# Patient Record
Sex: Male | Born: 1935
Health system: Southern US, Community
[De-identification: ages and names within clinical notes are randomized; demographics above are authoritative.]

## PROBLEM LIST (undated history)

## (undated) DIAGNOSIS — M545 Low back pain, unspecified: Secondary | ICD-10-CM

## (undated) DIAGNOSIS — E669 Obesity, unspecified: Secondary | ICD-10-CM

## (undated) DIAGNOSIS — R3915 Urgency of urination: Secondary | ICD-10-CM

## (undated) DIAGNOSIS — R269 Unspecified abnormalities of gait and mobility: Secondary | ICD-10-CM

## (undated) DIAGNOSIS — F32A Depression, unspecified: Secondary | ICD-10-CM

## (undated) DIAGNOSIS — I119 Hypertensive heart disease without heart failure: Secondary | ICD-10-CM

## (undated) DIAGNOSIS — Z9989 Dependence on other enabling machines and devices: Secondary | ICD-10-CM

## (undated) DIAGNOSIS — E785 Hyperlipidemia, unspecified: Secondary | ICD-10-CM

## (undated) DIAGNOSIS — G43909 Migraine, unspecified, not intractable, without status migrainosus: Secondary | ICD-10-CM

## (undated) DIAGNOSIS — F0390 Unspecified dementia without behavioral disturbance: Secondary | ICD-10-CM

## (undated) DIAGNOSIS — C44509 Unspecified malignant neoplasm of skin of other part of trunk: Secondary | ICD-10-CM

## (undated) DIAGNOSIS — I350 Nonrheumatic aortic (valve) stenosis: Secondary | ICD-10-CM

## (undated) DIAGNOSIS — E291 Testicular hypofunction: Secondary | ICD-10-CM

## (undated) DIAGNOSIS — T4145XA Adverse effect of unspecified anesthetic, initial encounter: Secondary | ICD-10-CM

## (undated) DIAGNOSIS — M1990 Unspecified osteoarthritis, unspecified site: Secondary | ICD-10-CM

## (undated) DIAGNOSIS — J449 Chronic obstructive pulmonary disease, unspecified: Secondary | ICD-10-CM

## (undated) DIAGNOSIS — G8929 Other chronic pain: Secondary | ICD-10-CM

## (undated) DIAGNOSIS — I639 Cerebral infarction, unspecified: Secondary | ICD-10-CM

## (undated) DIAGNOSIS — I5042 Chronic combined systolic (congestive) and diastolic (congestive) heart failure: Secondary | ICD-10-CM

## (undated) DIAGNOSIS — T8859XA Other complications of anesthesia, initial encounter: Secondary | ICD-10-CM

## (undated) DIAGNOSIS — I4892 Unspecified atrial flutter: Secondary | ICD-10-CM

## (undated) DIAGNOSIS — I679 Cerebrovascular disease, unspecified: Secondary | ICD-10-CM

## (undated) DIAGNOSIS — F329 Major depressive disorder, single episode, unspecified: Secondary | ICD-10-CM

## (undated) DIAGNOSIS — R296 Repeated falls: Secondary | ICD-10-CM

## (undated) DIAGNOSIS — N401 Enlarged prostate with lower urinary tract symptoms: Secondary | ICD-10-CM

## (undated) DIAGNOSIS — H04123 Dry eye syndrome of bilateral lacrimal glands: Secondary | ICD-10-CM

## (undated) DIAGNOSIS — I10 Essential (primary) hypertension: Secondary | ICD-10-CM

## (undated) DIAGNOSIS — I480 Paroxysmal atrial fibrillation: Secondary | ICD-10-CM

## (undated) DIAGNOSIS — I340 Nonrheumatic mitral (valve) insufficiency: Secondary | ICD-10-CM

## (undated) DIAGNOSIS — M4800 Spinal stenosis, site unspecified: Secondary | ICD-10-CM

## (undated) DIAGNOSIS — G4733 Obstructive sleep apnea (adult) (pediatric): Secondary | ICD-10-CM

## (undated) DIAGNOSIS — R011 Cardiac murmur, unspecified: Secondary | ICD-10-CM

## (undated) DIAGNOSIS — I2510 Atherosclerotic heart disease of native coronary artery without angina pectoris: Secondary | ICD-10-CM

## (undated) HISTORY — DX: Paroxysmal atrial fibrillation: I48.0

## (undated) HISTORY — DX: Unspecified osteoarthritis, unspecified site: M19.90

## (undated) HISTORY — DX: Chronic combined systolic (congestive) and diastolic (congestive) heart failure: I50.42

## (undated) HISTORY — DX: Hypertensive heart disease without heart failure: I11.9

## (undated) HISTORY — DX: Unspecified atrial flutter: I48.92

## (undated) HISTORY — DX: Obesity, unspecified: E66.9

## (undated) HISTORY — DX: Unspecified abnormalities of gait and mobility: R26.9

## (undated) HISTORY — DX: Hyperlipidemia, unspecified: E78.5

## (undated) HISTORY — PX: APPENDECTOMY: SHX54

## (undated) HISTORY — PX: TOTAL SHOULDER ARTHROPLASTY: SHX126

## (undated) HISTORY — PX: CATARACT EXTRACTION W/ INTRAOCULAR LENS  IMPLANT, BILATERAL: SHX1307

## (undated) HISTORY — DX: Unspecified dementia, unspecified severity, without behavioral disturbance, psychotic disturbance, mood disturbance, and anxiety: F03.90

## (undated) HISTORY — PX: MOHS SURGERY: SHX181

## (undated) HISTORY — PX: RETINAL DETACHMENT SURGERY: SHX105

## (undated) HISTORY — DX: Cerebral infarction, unspecified: I63.9

## (undated) HISTORY — DX: Cerebrovascular disease, unspecified: I67.9

## (undated) HISTORY — DX: Repeated falls: R29.6

## (undated) HISTORY — DX: Testicular hypofunction: E29.1

## (undated) HISTORY — DX: Nonrheumatic aortic (valve) stenosis: I35.0

## (undated) HISTORY — PX: JOINT REPLACEMENT: SHX530

## (undated) HISTORY — PX: EYE SURGERY: SHX253

## (undated) HISTORY — DX: Nonrheumatic mitral (valve) insufficiency: I34.0

## (undated) HISTORY — PX: CYST EXCISION: SHX5701

## (undated) HISTORY — PX: TONSILLECTOMY: SUR1361

## (undated) HISTORY — PX: SHOULDER SURGERY: SHX246

## (undated) HISTORY — DX: Atherosclerotic heart disease of native coronary artery without angina pectoris: I25.10

---

## 1989-12-15 HISTORY — PX: CORONARY ANGIOPLASTY: SHX604

## 1998-10-17 ENCOUNTER — Ambulatory Visit: Admission: RE | Admit: 1998-10-17 | Discharge: 1998-10-17 | Payer: Self-pay | Admitting: Family Medicine

## 1999-12-19 ENCOUNTER — Encounter (INDEPENDENT_AMBULATORY_CARE_PROVIDER_SITE_OTHER): Payer: Self-pay | Admitting: Specialist

## 1999-12-19 ENCOUNTER — Ambulatory Visit (HOSPITAL_COMMUNITY): Admission: RE | Admit: 1999-12-19 | Discharge: 1999-12-19 | Payer: Self-pay | Admitting: *Deleted

## 2001-12-15 HISTORY — PX: VIDEO ASSISTED THORACOSCOPY (VATS)/THOROCOTOMY: SHX6173

## 2001-12-28 ENCOUNTER — Encounter: Admission: RE | Admit: 2001-12-28 | Discharge: 2001-12-28 | Payer: Self-pay | Admitting: Ophthalmology

## 2001-12-28 ENCOUNTER — Encounter: Payer: Self-pay | Admitting: Ophthalmology

## 2002-01-21 ENCOUNTER — Encounter: Payer: Self-pay | Admitting: Cardiothoracic Surgery

## 2002-01-24 ENCOUNTER — Encounter: Payer: Self-pay | Admitting: Cardiothoracic Surgery

## 2002-01-24 ENCOUNTER — Inpatient Hospital Stay (HOSPITAL_COMMUNITY): Admission: RE | Admit: 2002-01-24 | Discharge: 2002-01-28 | Payer: Self-pay | Admitting: Cardiothoracic Surgery

## 2002-01-24 ENCOUNTER — Encounter (INDEPENDENT_AMBULATORY_CARE_PROVIDER_SITE_OTHER): Payer: Self-pay | Admitting: Specialist

## 2002-01-25 ENCOUNTER — Encounter: Payer: Self-pay | Admitting: Cardiothoracic Surgery

## 2002-01-26 ENCOUNTER — Encounter: Payer: Self-pay | Admitting: Cardiothoracic Surgery

## 2002-01-27 ENCOUNTER — Encounter: Payer: Self-pay | Admitting: Cardiothoracic Surgery

## 2002-01-28 ENCOUNTER — Encounter: Payer: Self-pay | Admitting: Cardiothoracic Surgery

## 2002-02-10 ENCOUNTER — Encounter: Admission: RE | Admit: 2002-02-10 | Discharge: 2002-02-10 | Payer: Self-pay | Admitting: Cardiothoracic Surgery

## 2002-02-10 ENCOUNTER — Encounter: Payer: Self-pay | Admitting: Cardiothoracic Surgery

## 2002-11-14 HISTORY — PX: CORONARY ARTERY BYPASS GRAFT: SHX141

## 2003-11-15 HISTORY — PX: CARDIAC CATHETERIZATION: SHX172

## 2003-11-23 ENCOUNTER — Inpatient Hospital Stay (HOSPITAL_COMMUNITY): Admission: AD | Admit: 2003-11-23 | Discharge: 2003-12-06 | Payer: Self-pay | Admitting: Cardiology

## 2003-12-22 ENCOUNTER — Encounter: Admission: RE | Admit: 2003-12-22 | Discharge: 2003-12-22 | Payer: Self-pay | Admitting: Cardiology

## 2004-01-29 ENCOUNTER — Encounter (HOSPITAL_COMMUNITY): Admission: RE | Admit: 2004-01-29 | Discharge: 2004-04-28 | Payer: Self-pay | Admitting: Cardiology

## 2004-10-15 ENCOUNTER — Encounter: Admission: RE | Admit: 2004-10-15 | Discharge: 2004-10-15 | Payer: Self-pay | Admitting: Cardiology

## 2004-12-15 DIAGNOSIS — I679 Cerebrovascular disease, unspecified: Secondary | ICD-10-CM

## 2004-12-15 HISTORY — DX: Cerebrovascular disease, unspecified: I67.9

## 2005-06-06 ENCOUNTER — Encounter: Admission: RE | Admit: 2005-06-06 | Discharge: 2005-06-06 | Payer: Self-pay | Admitting: Cardiology

## 2006-08-05 ENCOUNTER — Inpatient Hospital Stay (HOSPITAL_COMMUNITY): Admission: RE | Admit: 2006-08-05 | Discharge: 2006-08-07 | Payer: Self-pay | Admitting: Neurosurgery

## 2006-08-05 ENCOUNTER — Encounter (INDEPENDENT_AMBULATORY_CARE_PROVIDER_SITE_OTHER): Payer: Self-pay | Admitting: Specialist

## 2007-05-06 ENCOUNTER — Encounter: Admission: RE | Admit: 2007-05-06 | Discharge: 2007-05-06 | Payer: Self-pay | Admitting: Neurology

## 2008-06-19 ENCOUNTER — Ambulatory Visit: Payer: Self-pay | Admitting: Family Medicine

## 2008-06-27 ENCOUNTER — Encounter: Payer: Self-pay | Admitting: Family Medicine

## 2008-06-27 ENCOUNTER — Ambulatory Visit (HOSPITAL_COMMUNITY): Admission: RE | Admit: 2008-06-27 | Discharge: 2008-06-27 | Payer: Self-pay | Admitting: Family Medicine

## 2008-06-27 ENCOUNTER — Ambulatory Visit: Payer: Self-pay | Admitting: Vascular Surgery

## 2008-06-27 ENCOUNTER — Ambulatory Visit: Payer: Self-pay | Admitting: Family Medicine

## 2008-06-29 ENCOUNTER — Ambulatory Visit: Payer: Self-pay | Admitting: Family Medicine

## 2008-06-29 ENCOUNTER — Encounter: Admission: RE | Admit: 2008-06-29 | Discharge: 2008-06-29 | Payer: Self-pay | Admitting: Family Medicine

## 2008-08-22 ENCOUNTER — Ambulatory Visit: Payer: Self-pay | Admitting: Family Medicine

## 2008-09-12 ENCOUNTER — Ambulatory Visit: Payer: Self-pay | Admitting: Family Medicine

## 2009-03-05 ENCOUNTER — Ambulatory Visit: Payer: Self-pay | Admitting: Family Medicine

## 2009-05-07 ENCOUNTER — Ambulatory Visit: Payer: Self-pay | Admitting: Family Medicine

## 2009-09-03 ENCOUNTER — Encounter: Admission: RE | Admit: 2009-09-03 | Discharge: 2009-09-03 | Payer: Self-pay | Admitting: Family Medicine

## 2009-09-03 ENCOUNTER — Ambulatory Visit: Payer: Self-pay | Admitting: Family Medicine

## 2010-01-16 ENCOUNTER — Ambulatory Visit: Payer: Self-pay | Admitting: Family Medicine

## 2010-02-13 ENCOUNTER — Ambulatory Visit: Payer: Self-pay | Admitting: Family Medicine

## 2010-02-26 ENCOUNTER — Ambulatory Visit: Payer: Self-pay | Admitting: Surgery

## 2010-02-26 ENCOUNTER — Encounter: Payer: Self-pay | Admitting: Family Medicine

## 2010-02-26 ENCOUNTER — Ambulatory Visit (HOSPITAL_COMMUNITY): Admission: RE | Admit: 2010-02-26 | Discharge: 2010-02-26 | Payer: Self-pay | Admitting: Family Medicine

## 2011-03-07 ENCOUNTER — Inpatient Hospital Stay (HOSPITAL_BASED_OUTPATIENT_CLINIC_OR_DEPARTMENT_OTHER)
Admission: RE | Admit: 2011-03-07 | Discharge: 2011-03-07 | Disposition: A | Discharge status: Home / Self Care | Payer: Medicare Other | Source: Ambulatory Visit | Attending: Interventional Cardiology | Admitting: Interventional Cardiology

## 2011-03-07 DIAGNOSIS — I25810 Atherosclerosis of coronary artery bypass graft(s) without angina pectoris: Secondary | ICD-10-CM | POA: Insufficient documentation

## 2011-03-07 DIAGNOSIS — R0989 Other specified symptoms and signs involving the circulatory and respiratory systems: Secondary | ICD-10-CM | POA: Insufficient documentation

## 2011-03-07 DIAGNOSIS — I2510 Atherosclerotic heart disease of native coronary artery without angina pectoris: Secondary | ICD-10-CM | POA: Insufficient documentation

## 2011-03-07 DIAGNOSIS — R0609 Other forms of dyspnea: Secondary | ICD-10-CM | POA: Insufficient documentation

## 2011-03-07 DIAGNOSIS — I2582 Chronic total occlusion of coronary artery: Secondary | ICD-10-CM | POA: Insufficient documentation

## 2011-03-07 DIAGNOSIS — R9439 Abnormal result of other cardiovascular function study: Secondary | ICD-10-CM | POA: Insufficient documentation

## 2011-04-04 NOTE — Cardiovascular Report (Signed)
NAME:  Aaron Berg, Aaron Berg NO.:  [id number]  MEDICAL RECORD NO.:  [id number]            PATIENT TYPE:  LOCATION:                                 FACILITY:  PHYSICIAN:  Lyn Records, M.D.        DATE OF BIRTH:  DATE OF PROCEDURE:  03/07/2011 DATE OF DISCHARGE:                           CARDIAC CATHETERIZATION   INDICATIONS:  Exertional dyspnea with some angina.  This study is being done to rule out bypass graft failure.  PROCEDURE PERFORMED: 1. Left heart cath via right femoral artery. 2. Left ventriculography. 3. Coronary angiography. 4. Bypass graft angiography including internal mammary angiography.  DESCRIPTION:  A 4-French access was established using a modified Seldinger technique.  1% Xylocaine was used before sheath placement.  A 4-French #4 left Judkins catheter was used for left main angiography and saphenous vein graft to the circumflex angiography.  We then used a series of catheters due to significant tortuosity on the patient's aorta.  We used an internal mammary artery catheter for left internal mammary artery angiography.  We attempted to use a No Torque right catheter for right coronary graft angiography, but it was not successful.  We were able to image the native right coronary artery with the 3-D RCA.  We then used a multipurpose catheter for saphenous vein graft to the right coronary angiography.  We then used #1 followed by #2 4-French left Amplatz to give [place] an appropriate angle to get into the left ventricle.  We did not use a stiff wire, but instead used a Glidewire.  We were able to enter the left ventricle and advanced the Amplatz into the left ventricle.  We then used a tight-J exchange 0.035 wire to remove the Amplatz and placed a 4-French multipurpose catheter. With this catheter, we then performed a hand injection, left ventriculography, and pressure recordings.  Pullback was obtained.  Case was terminated.  No complications  occurred.  RESULTS: 1. Hemodynamic data:     a.     Aortic pressure 107/70.     b.     Left ventricular pressure 111/15 mmHg. 2. Left ventriculography:  The LV cavity is mildly dilated.  Ejection     fraction was decreased with an EF in the 30%-40% range and on     average appearing to be around 35%.  No obvious mitral     regurgitation. 3. Coronary angiography:     a.     Left main coronary and left coronary system heavily      calcified with total occlusion of the distal left main.     b.     Left anterior descending:  Totally occluded.     c.     Circumflex artery:  Totally occluded.     d.     Right coronary artery:  Totally occluded in the ostial      segment. 4. Bypass graft angiography.     a.     Saphenous vein graft and circumflex, contains      ostial/proximal 50% narrowing.  The graft has some moderate      degenerative  disease in the mid and distal segment, but no high-      grade obstruction is noted.     b.     Saphenous vein graft to the RCA:  Widely patent. 5. LIMA to LAD:  Widely patent.  CONCLUSION: 1. Abnormal nuclear study with decreased LV function and mid anterior     wall ischemia, explained by today's angiogram to include     significant disease involving the septal perforators to the LAD and     the first diagonal.  There is retrograde flow into the near     proximal LAD via the patent LIMA to the LAD.  Antegrade LAD is     totally occluded. 2. Patent LIMA to LAD. 3. Patent saphenous vein graft to RCA. 4. 50% ostial stenosis in the saphenous vein graft to the circumflex. 5. Total occlusion of the native right coronary. 6. LVEF 35% with global hypokinesis.  PLAN:  Intensify heart failure therapy.  May need to increase diuretic regimen.  No opportunities for revascularization at this point.     Lyn Records, M.D.     HWS/MEDQ  D:  03/07/2011  T:  03/08/2011  Job:  [id number]  cc:   [name]. Abigail Miyamoto, M.D.  Electronically Signed by Verdis Prime  M.D. on 04/04/2011 04:55:43 PM

## 2011-05-02 ENCOUNTER — Other Ambulatory Visit: Payer: Self-pay | Admitting: Family Medicine

## 2011-05-02 NOTE — Op Note (Signed)
[place]  Patient:    Aaron Berg, Aaron Berg Visit Number: [id number] MRN: [id number]          Service Type: SUR Location: 2000 2020 01 Attending Physician:  Waldo Laine Dictated by:   Gwenith Daily Tyrone Sage, M.D. Proc. Date: 01/24/02 Admit Date:  01/24/2002                             Operative Report  PREOPERATIVE DIAGNOSES:  Left lung nodule.  POSTOPERATIVE DIAGNOSIS:  Benign left upper lobe lung nodule.  PROCEDURE PERFORMED:  Mini thoracotomy with excisional biopsy of left upper lobe lung lesion and bronchoscopy.  SURGEON:  Gwenith Daily. Tyrone Sage, M.D.  FIRST ASSISTANT:  Salvatore Decent. Dorris Fetch, M.D.  BRIEF HISTORY:  The patient is a 75 year old male who had an incidental CT scan of the chest in evaluation for possible myasthenia gravis.  With further evaluation, it was determined that the patient did not have myasthenia gravis but the CT scan revealed a noncalcified 8 mm lung nodule in the left upper lobe.  The nodule was deep in the lobe and could not be needle biopsied or characterized and because of the patients smoking history, it was felt that it could represent an early carcinoma of the lung.  The patient had had no previous CT scan to compare this nodule to and there was no evidence of calcification in the nodule.  Because of these reasons, it was recommended to the patient that we proceed with video-assisted thoracoscopy/mini thoracotomy to obtain a definitive tissue diagnosis.  The patient was aware that this could be a benign nodule but preferred to obtain a definitive diagnosis at this time rather than waiting for potential enlargement if it was a lung cancer.  The procedure was discussed with the patient in detail.  In addition, he had had a history of coronary artery disease in the past and on the CT scan, had some evidence of calcification in the coronary arteries.  Because of these reasons, he was preoperatively referred  to Dr. Viann Fish for preoperative clearance.  A stress test was performed and did not show evidence of ischemia.  The patient agreed to procedure and signed informed consent.  DESCRIPTION OF PROCEDURE:  With Swan-Ganz and central and arterial line in place, the patient underwent general endotracheal anesthesia with placement of a double-lumen endotracheal tube.  Through the endotracheal tube, fiberoptic scope was passed.  There were no endobronchial lesions noted and the split lumen endotracheal tube was in good anatomic position.  The patient was then turned in a lateral decubitus position.  Initially, a very small incision was made to introduce the videoscope into the left chest cavity.  With the scope, a preliminary examination of the chest was carried out.  There were no other obvious pleura or lesions in the surface of the lung that were appreciated. However, because of the very small size of this lesion and its deep parenchymal position, with the scope only it could not be located and a small thoracotomy incision was made to allow palpation of the lung.  There were no palpable lesions in the lower lobe.  With some difficulty, finally a pea-sized fairly mobile lesion was appreciated centrally in the left upper lobe.  The minor fissure was developed further to allow better palpation and location of the nodule.  The nodule was then located and a small incision was made into the parenchyma of the lung and  the lesion enucleated.  The lesion was in close proximity to branches of the pulmonary artery.  However, these were all preserved.  The nodule was submitted to the pathologist for culture and also for histologic examination.  Frozen section showed no evidence of malignancy. Using an absorbable Dexon suture, the incision in the lung tissue was closed, the lung was ventilated, there was minimal air leak.  Two-28 chest tubes were left in place.  The ribs were reapproximated with #2  Dexon sutures.  The intercostal muscle bundles were closed with a running 2-0 Prolene.  The muscle layers were closed with interrupted 0-Vicryl sutures.  Skin staples were placed in the skin edges.  Dry dressings were applied.  Sponge and needle count was reported as correct at the completion of the procedure.  The patient tolerated the procedure without obvious complication and was extubated and returned to the intensive care unit for postoperative observation. Dictated by:   Gwenith Daily Tyrone Sage, M.D. Attending Physician:  Waldo Laine DD:  01/27/02 TD:  01/27/02 Job: [id number]

## 2011-05-02 NOTE — Discharge Summary (Signed)
NAME:  JAQUIL, TODT NO.:  [id number]   MEDICAL RECORD NO.:  [id number]                   PATIENT TYPE:  INP   LOCATION:  2023                                 FACILITY:  [facility]   PHYSICIAN:  Gwenith Daily. Tyrone Sage, M.D.            DATE OF BIRTH:  02-Nov-1936   DATE OF ADMISSION:  11/23/2003  DATE OF DISCHARGE:  12/02/2003                                 DISCHARGE SUMMARY   HISTORY OF PRESENT ILLNESS:  This is a 75 year old male who was recently  referred to Dr. Donnie Aho for evaluation for symptoms of chest pressure and  heaviness.  He has a known history of coronary artery disease with previous  angioplasty of the right coronary artery and circumflex artery approximately  13 years ago.  Recently he developed an episode where he presented to his  primary care physician of chest pressure with exertion.  Upon further  evaluation, it was Dr. York Spaniel recommendation to admit the patient for  cardiac catheterization to better delineate his coronary anatomy.   PAST MEDICAL HISTORY:  1. Hyperlipidemia.  2. Obesity.  3. Hypertension.  4. Coronary artery disease.  5. Previous PTCA.   ALLERGIES:  PROCARDIA causes a rash.   CURRENT MEDICATIONS:  1. Aggrenox 25/200 mg b.i.d.  2. Altace 10 mg daily.  3. Aspirin 81 mg daily.  4. Cod liver oil daily.  5. Vitamins A and D daily.  6. Folic acid 0.8 mg daily.  7. Hydrochlorothiazide 25 mg daily.  8. Magnesium oxide 25 mg daily.  9. Multivitamins 1 daily.  10.      Protonix 40 mg b.i.d.  11.      Toprol XL 50 mg daily.  12.      Vitamin B12 500 mcg daily.  13.      Vitamin E 400 international units 2 daily.  14.      Zetia 10 mg daily.  15.      Zocor 80 mg daily.   FAMILY HISTORY, SOCIAL HISTORY, REVIEW OF SYSTEMS, AND PHYSICAL EXAMINATION:  Please see History and Physical done at time of admission.   HOSPITAL COURSE:  The patient was admitted electively on November 23, 2003,  and taken to the cardiac  catheterization where he was found to have severe  left main and three-vessel coronary artery disease.  He was felt to require  admission for cardiac surgical consultation which was undertaken with Dr.  Sheliah Plane, who evaluated the patient and studies and agreed to proceed  with surgical revascularization.   On November 27, 2003, the patient was taken to the operating room where he  underwent the following procedure: Coronary artery bypass grafting x 3.  The  following grafts were placed: 1) Left internal mammary artery to the LAD.  2) Saphenous vein graft to the posterior descending.  3) Saphenous vein  graft to the obtuse marginal.  The patient tolerated the procedure well and  was taken to the surgical intensive care unit in stable condition.   Postoperative hospital course, the patient has done well.  He required some  dopamine and Neo-Synephrine, but these were weaned without difficulty.  All  routine lines, monitors, drains, devices were discontinued in the standard  fashion.  He had moderate postoperative anemia with most recent hemoglobin  and hematocrit dated November 30, 2003, of 8.2 and 23.8, respectively.  He  has also had postoperative atrial fibrillation and has been chemically  cardioverted to normal sinus rhythm with several agents including digoxin,  beta blocker, and amiodarone.  He has required some diuresis  postoperatively.  His incisions are healing well without signs of infection.  He is tolerating routine activity commensurate with level of postoperative  convalescence using cardiac rehabilitation phase I modalities.   Overall tentatively he is felt to be quite stable for discharge on the  morning of December 02, 2003, pending morning round evaluation.   DISCHARGE MEDICATIONS:  1. Amiodarone 400 mg twice daily for 5 days, then 200 mg twice daily.  2. Lopressor 25 mg twice daily.  3. Aspirin 325 mg daily.  4. Zocor 80 mg daily.  5. Zetia 10 mg daily.  6.  Altace 5 mg twice daily.  7. Digoxin 0.125 mg daily.  8. Lasix 40 mg daily for 7 days.  9. KCl 20 mEq daily for 7 days.  10.      Folic acid 1 mg daily.  11.      Multivitamins 1 daily.  12.      For pain, Tylox 1 or 2 every 4 to 6 hours as needed.   DISCHARGE INSTRUCTIONS:  The patient received written instructions regarding  medications, activity, diet, wound care, and followup.  Followup will  include Dr. Donnie Aho in two week, Dr. Tyrone Sage in three weeks, Thursday,  January 13, at 11 o'clock.   CONDITION ON DISCHARGE:  Stable and improved.   FINAL DIAGNOSES:  1. Severe multivessel coronary artery disease.  2. Hyperlipidemia.  3. Obesity.  4. Hypertension.  5. Previous percutaneous transluminal cardiac angioplasty.  6. Postoperative anemia.  7. Postoperative atrial fibrillation.      Rowe Clack, P.A.-C.                    Gwenith Daily Tyrone Sage, M.D.    Sherryll Burger  D:  12/01/2003  T:  12/03/2003  Job:  [id number]   cc:   W. Ashley Royalty., M.D.  [address]  [place]  [place] [address]  Fax: [phone]   Gwenith Daily. Tyrone Sage, M.D.  [address]  [place]  [place] [address]   [name]. Abigail Miyamoto, M.D.  [address]  [place]  [place] [address]  Fax: [phone]

## 2011-05-02 NOTE — Cardiovascular Report (Signed)
NAME:  Aaron Berg, Aaron Berg NO.:  [id number]   MEDICAL RECORD NO.:  [id number]                   PATIENT TYPE:  INP   LOCATION:  2023                                 FACILITY:  [facility]   PHYSICIAN:  W. Ashley Royalty., M.D.         DATE OF BIRTH:  [birth year]   DATE OF PROCEDURE:  11/23/2003  DATE OF DISCHARGE:  12/06/2003                              CARDIAC CATHETERIZATION   HISTORY:  An 75 year old male with known coronary artery disease who  presented with increasing chest discomfort despite medical therapy and  catheterization was advised to evaluate for coronary status.   PROCEDURE:  Left heart catheterization with coronary angiograms and left  ventriculogram.   COMMENTS ABOUT PROCEDURE:  The patient tolerated the procedure well.  At the  end of the procedure he had a good hemostasis and peripheral pulses noted.   HEMODYNAMIC DATA:  1. Aorta postcontrast:  122/80.  2. LV postcontrast:  122/7-12.   ANGIOGRAPHIC DATA:   LEFT VENTRICULOGRAM:  Performed in the 30 degree RAO projection.  The aortic  valve was normal.  The mitral valve was normal.  There was mild mitral  regurgitation noted.  There was mild inferior wall hypokinesis with an  estimated ejection fraction of 50%.  Coronary arteries are heavily  calcified.  The left main coronary artery is calcified with a distal 70%  eccentric stenosis.  Left anterior descending is heavily calcified with a  diffuse 50% proximal narrowing.  Circumflex has a calcified ostial stenosis  and an eccentric calcified mid 50% stenosis.  The right coronary artery is  occluded proximally with left-to-right collaterals.   IMPRESSION:  1. Severe left main and three-vessel coronary artery disease with complete     occlusion of the right coronary artery.  2. Preserved left ventricular function with mild inferior wall hypokinesis.   RECOMMENDATIONS:  Consider coronary bypass grafting.                     Darden Palmer., M.D.    WST/MEDQ  D:  03/25/2004  T:  03/26/2004  Job:  [id number]   cc:   [name]. Abigail Miyamoto, M.D.  [address]  [place]  [place] [address]  Fax: [phone]

## 2011-05-02 NOTE — Op Note (Signed)
NAMEMELCHOR, KIRCHGESSNER NO.:  [id number]   MEDICAL RECORD NO.:  [id number]          PATIENT TYPE:  INP   LOCATION:  3007                         FACILITY:  [facility]   PHYSICIAN:  Hilda Lias, M.D.   DATE OF BIRTH:  [birth year]   DATE OF PROCEDURE:  08/05/2006  DATE OF DISCHARGE:                                 OPERATIVE REPORT   PREOPERATIVE DIAGNOSIS:  Synovial cyst, L3-4 with lumbar stenosis and  bilateral radiculopathy.   POSTOPERATIVE DIAGNOSIS:  Synovial cyst, L3-4 with lumbar stenosis and  bilateral radiculopathy.   PROCEDURE:  Bilateral L3-L4 laminotomies.  Removal of spinous process.  Removal of synovial cyst.  Medial bilateral foraminotomy.  Microscopy.   SURGEON:  Hilda Lias, M.D.   ASSISTANT:  Clydene Fake, M.D.   CLINICAL HISTORY:  The patient was admitted because of back pain radiating  to both legs.  The left was worse than the right one.  X-ray showed lumbar  stenosis at the L3-4 with a large synovial cyst.  Surgery was advised.  The  risks were explained to him and history and physical.   PROCEDURE IN DETAIL:  The patient was taken to the operating room and he was  positioned in a prone manner.  The back was prepped with DuraPrep.  X-ray  showed that we were in L3.  From then on, a midline incision from L3 to L4  was made.  Muscle was retracted laterally.  We repeated the x-ray to be sure  and I did the spinous process of L3-4 where the clamps were.  From then on,  we __________ that he had bilateral radiculopathy and the cyst was going to  the opposite side.  We removed the spinous process of L3 and L4 and with the  laminotomy at L3-4 bilaterally.  This was done using the microscope.   Indeed, we found, that the cyst was at the left side, displacing the thecal  sac of the right side and the cyst was wrapped around dorsally into the  right side.  Microdissection was accomplished and we were able to remove a  cyst of approximately 1 x  0.8 cm.  We went laterally and there was no more  evidence of cyst.  Immediately the dural matter came back to normal place.  We did a foraminotomy at L3-4 bilaterally.  It was felt that the ligament  was negative.   __________ and Depo-Medrol were left in the dural space and the wound was  closed with Vicryl and Steri-Strips.           ______________________________  Hilda Lias, M.D.     EB/MEDQ  D:  08/05/2006  T:  08/05/2006  Job:  [id number]

## 2011-05-02 NOTE — Discharge Summary (Signed)
NAME:  Aaron Berg, Aaron Berg NO.:  [id number]   MEDICAL RECORD NO.:  [id number]                   PATIENT TYPE:  INP   LOCATION:  2023                                 FACILITY:  [facility]   PHYSICIAN:  Gwenith Daily. Tyrone Sage, M.D.            DATE OF BIRTH:  07/25/1936   DATE OF ADMISSION:  11/23/2003  DATE OF DISCHARGE:  12/06/2003                                 DISCHARGE SUMMARY   ADDENDUM:  [name] was originally scheduled for possible discharge in the  morning of December 02, 2003, however, upon further evaluation, he was noted  to have both a low-grade fever as well as recurrent atrial fibrillation.  This warranted a few more days of hospitalization.  He was placed on Avelox  for a possible phlebitis.  All other infectious evaluation was unremarkable.  He did defervesce.  His white blood cell count was normal.  In regards to  his atrial fibrillation, medications were further adjusted and he was also  started on Coumadin therapy.  His INR has been monitored daily and is not  quite therapeutic.  He will be checked on December 06, 2003 and a final dose  will be made at that time with aggressive close followup through Dr. Leeroy Bock Tilley's office.  Tentatively, he is felt to be stable for discharge  in the morning on December 06, 2003, pending morning round reevaluation.  He  is otherwise doing quite well.   MEDICATIONS ON DISCHARGE:  1. Amiodarone 200 mg daily.  2. Lopressor 50 mg twice daily.  3. Aspirin 325 mg daily.  4. Folic acid 1 mg daily.  5. Zetia 10 mg daily.  6. Coumadin -- dose to be determined at time of discharge.  7. Lipitor 40 mg daily.  8. Avelox 400 mg daily for an additional three days.  9. Digoxin 0.125 mg daily.  10.      Altace 5 mg twice daily.   SPECIAL DISCHARGE INSTRUCTIONS:  The patient is to stop his Zocor while on  amiodarone.   INSTRUCTIONS:  The patient received written instructions in regard to  medications, activity,  diet, wound care and followup.  Followup will include  Coumadin clinic through Dr. York Spaniel office with first to be drawn on  Monday, December 11, 2003.  Other instructions and followup are as  previously dictated.      Rowe Clack, P.A.-C.                    Gwenith Daily Tyrone Sage, M.D.    Sherryll Burger  D:  12/05/2003  T:  12/06/2003  Job:  [id number]   cc:   Gwenith Daily. Tyrone Sage, M.D.  [address]  [place]  [place] [address]   W. Ashley Royalty., M.D.  [address]  [place]  [place] [address]  Fax: [phone]   Macguire Holsinger. Abigail Miyamoto, M.D.  [address]  [place]  [place] [address]  Fax: [phone]

## 2011-05-02 NOTE — Discharge Summary (Signed)
[place]  Patient:    Aaron Berg, Aaron Berg Visit Number: [id number] MRN: [id number]          Service Type: SUR Location: 2000 2020 01 Attending Physician:  Aaron Berg Dictated by:   Aaron Berg, P.A. Admit Date:  01/24/2002 Discharge Date: 01/28/2002   CC:         CVTS office  Aaron Berg. Aaron Berg, M.D.  Aaron Berg., M.D.   Discharge Summary  DATE OF BIRTH:  25-Dec-2035  PRIMARY ADMISSION DIAGNOSIS:  Left lung nodule.  SECONDARY DIAGNOSES/PAST MEDICAL HISTORY: 1. History of angina in 1990, and underwent cardiac catheterization and    angioplasty in October 1990. 2. Hypertension. 3. Hyperlipidemia.  NEW DIAGNOSES/DISCHARGE DIAGNOSES: 1. Granuloma of left lung. 2. Status post left VATS and thoracotomy. 3. Allergy to PERCOCET causing a rash.  PROCEDURES:  Left VATS with thoracotomy and excision of left lung nodule on January 24, 2002.  HISTORY OF PRESENT ILLNESS:  This patient is a 75 year old Caucasian male who has noted several years of progressive problems with double vision when he looks to the left.  Because of this slowly increasing severity of symptoms, and because he felt as if he needed a routine eye exam, he sought medical attention from his optometrist who then referred him to Dr. Donnie Berg.  Since then he was evaluated for possible myasthenia gravis.  A CT scan of the chest was performed to evaluate a possible thalamic tumor, which there was no evidence of an enlarged sinus or thymic tumor present.  However, on the CT scan, there was an 8.4 mm smooth, non-calcified nodule in the mid portion of the left upper lobe.  There was no associated adenopathy.  He also had significant coronary calcification noted on the CT scan.  His symptoms include trouble lifting his arms after he has been exercising, and trouble getting up from stooping position.  Neurology evaluation found that the patient does not have  myasthenia gravis.  He was cleared by Dr. Donnie Berg to proceed with surgery, including a video assisted thoracoscopy, and possible miny thoracotomy.  A MRI and MRA scan revealed also a question of an old stroke involving his sixth cranial nerve, possibly explaining his double vision.  It was felt at this time that he did not have myasthenia gravis.  Nevertheless, the patient was seen in consultation by Dr. Tyrone Berg who had recommended to proceed with a left VATS and thoracotomy.  HOSPITAL COURSE:  The patient underwent the procedures stated above on January 24, 2002.  The patient tolerated the procedure well.  His postoperative course was essentially uneventful.  He was extubated in the operating room.  His pathology report indicated that the nodule was a granuloma.  He was transferred to unit 2000 on postoperative day #2.  He remained hemodynamically stable.  He was tolerating room air oxygen on postoperative day #2.  He continued incentive spirometer and pulmonary toilet. He was ambulating without difficulty.  On postoperative day #3, it was noted that the patient developed an allergy to Percocet, causing a rash.  It was then discontinued, and he was kept on Darvocet as previously ordered.  His wounds remained clean and dry without any signs or indications of any infection.  He was anticipated for discharge the morning of January 28, 2002.  CONDITION ON DISCHARGE:  Stable.  DISPOSITION:  He will be discharged home to his daughters house in [place]. His home is a motor home which he travels around in, but  he is based out of [place].  DISCHARGE MEDICATIONS: 1. Toprol XL 50 mg q.d. 2. Altace 10 mg q.d. 3. Zocor 40 mg q.d. 4. Zetia 10 mg q.d. 5. Coated aspirin 325 mg q.d. 6. Darvocet one or two tablets q.3-4h. p.r.n. pain.  ACTIVITY:  The patient is instructed not to do any driving, heavy lifting more than 10 pounds.  He is to walk daily and continue exercises.  DIET:  Follow a  Heart Healthy Diet.  WOUND CARE:  Told he can shower as before.  FOLLOWUP: 1. Staple removal appointment on Thursday, February 03, 2002, at 1 p.m. 2. Dr. Tyrone Berg on Thursday, February 10, 2002, at 10:20 a.m., after getting a    chest x-ray at [facility] at 9:15 a.m.  He is to bring    that chest x-ray with him. 3. Dr. Abigail Berg as directed or as needed. 4. Dr. Donnie Berg as directed or as needed. Dictated by:   Aaron Berg, P.A. Attending Physician:  Aaron Berg DD:  01/27/02 TD:  01/28/02 Job: [id number]

## 2011-05-02 NOTE — Op Note (Signed)
NAME:  Aaron Berg, Aaron Berg                         ACCOUNT NO.:  [id number]   MEDICAL RECORD NO.:  [id number]                   PATIENT TYPE:  INP   LOCATION:  2310                                 FACILITY:  [facility]   PHYSICIAN:  Gwenith Daily. Tyrone Sage, M.D.            DATE OF BIRTH:  07-30-1936   DATE OF PROCEDURE:  11/27/2003  DATE OF DISCHARGE:                                 OPERATIVE REPORT   PREOPERATIVE DIAGNOSIS:  Coronary occlusive disease with left main  obstruction.   POSTOPERATIVE DIAGNOSIS:  Coronary occlusive disease with left main  obstruction.   PROCEDURE:  Coronary artery bypass grafting x3 with left internal mammary to  the left anterior descending coronary artery, reverse saphenous vein graft  to the circumflex coronary artery, reverse saphenous vein graft to the  posterior descending coronary artery with bilateral thigh vein endovein  harvesting.   SURGEON:  Gwenith Daily. Tyrone Sage, M.D.   FIRST ASSISTANT:  Rowe Clack, P.A.-C.   INDICATIONS FOR PROCEDURE:  The patient is a 75 year old male with known  coronary occlusive disease who had angioplasty of the right coronary artery  13 years previously. He had done well without recurrent symptoms until the  past several weeks, when he noted increased symptoms of exertional chest  pain. Cardiac catheterization was performed by Dr. Donnie Aho, which  demonstrated 70% to 80% left main disease, 70% to 80% LAD disease, the  circumflex with 60% to 70% stenosis and total occlusion of the right  coronary artery. Because of the patient's increasing  anginal symptoms,  significant left main disease with total occlusion of the right coronary  artery, coronary artery bypass grafting was recommended. The patient agreed  and signed informed consent.   DESCRIPTION OF PROCEDURE:  With Swann-Ganz and arterial line monitors in  place, the patient underwent general endotracheal anesthesia without  incident. The skin of the chest and legs was  prepped with Betadine and  draped in the usual sterile manner. Vein was  initially harvested from the  right thigh endoscopically, where as we continued down the leg for  additional vein. The lower portion of the right leg vein was of good  quality. It became small. Additional vein was then harvested from the left  thigh endoscopically and was of good quality and caliber.   A median sternotomy was performed. The left internal mammary artery was  dissected down as a pedicle graft. The distal artery was divided. Because of  an abnormal palmar arch, the radial artery was not harvested.   The pericardium was opened. Overall ventricular function appeared  preserved. The patient was systemically heparinized. The ascending aorta and  right atrium were cannulated and the aortic root vent __________ was  introduced into the ascending aorta. The patient was placed on  cardiopulmonary bypass 2.4 liters/minute per meter squared.   The sites of the anastomosis were selected and dissected out of the  epicardium. The patient's body temperature  was cooled to 30 degrees. The  aortic cross clamp  was applied and 500 mL of cold blood potassium  cardioplegia was administered with rapid diastolic arrest of the heart. The  myocardial septal temperature __________.   Attention was turned first to the posterior descending coronary artery which  was opened and admitted a 1.5-mm probe. Using a running 7-0 Prolene a distal  anastomosis was performed. Attention was then turned to the circumflex,  which was intramyocardial and was dissected out of the lateral  wall of the  heart. Additional cold blood cardioplegia was administered.   Attention was then turned to the left anterior descending coronary artery  which was opened and a 1.5 probe was passed distally without difficulty.  Using a running 8-0 Prolene, the left internal mammary artery was  anastomosed to the left anterior descending coronary artery.    With release of the bulldog on the mammary artery, there was appropriate  rise in the myocardial septal temperature. The aortic cross clamp  was  removed with a total cross clamp  time of 44 minutes. The patient required  electrical  defibrillation to return to a sinus rhythm. A partial occlusion  clamp was placed on the ascending aorta. Two punch aortotomies were  performed and each of the 2 vein grafts were anastomosed to the ascending  aorta. Air was evacuated from the graft. The partial occlusion clamp was  removed.   The sites of the anastomoses were inspected and were free of bleeding. The  patient was then ventilated and weaned from cardiopulmonary bypass without  difficulty. He remained hemodynamically stable. He was decannulated in the  usual fashion. Protamine sulfate was administered with the operative field  hemostatic. Two atrial and two ventricular pacing wires were applied. Graft  markers were applied. A left pleural tube and 2 mediastinal tubes were left  in place.   The sternum was closed with #6 stainless steel wire. The fascia was closed  with interrupted 0 Vicryl and 4-0 subcuticular stitch in the skin edges. Dry  dressings were applied. Sponge and needle counts were reported as correct at  the completion of the procedure.   The patient tolerated the procedure without obvious complications. He was  transferred to the surgical intensive care unit for further postoperative  care.                                               Gwenith Daily Tyrone Sage, M.D.    [name]  D:  11/27/2003  T:  11/27/2003  Job:  [id number]

## 2011-07-08 ENCOUNTER — Telehealth: Payer: Self-pay | Admitting: Family Medicine

## 2011-07-08 NOTE — Telephone Encounter (Signed)
GAVE CHART TO DR.LALONDE TO LET HIM DECIDE ON WHAT TO DO

## 2011-07-08 NOTE — Telephone Encounter (Signed)
Called pt to let him know he needs appt.

## 2011-07-09 ENCOUNTER — Ambulatory Visit (INDEPENDENT_AMBULATORY_CARE_PROVIDER_SITE_OTHER): Payer: Medicare Other | Admitting: Family Medicine

## 2011-07-09 ENCOUNTER — Encounter: Payer: Self-pay | Admitting: Family Medicine

## 2011-07-09 VITALS — BP 112/60 | HR 70 | Wt 252.0 lb

## 2011-07-09 DIAGNOSIS — E291 Testicular hypofunction: Secondary | ICD-10-CM

## 2011-07-09 DIAGNOSIS — G4730 Sleep apnea, unspecified: Secondary | ICD-10-CM

## 2011-07-09 DIAGNOSIS — Z125 Encounter for screening for malignant neoplasm of prostate: Secondary | ICD-10-CM

## 2011-07-09 DIAGNOSIS — E785 Hyperlipidemia, unspecified: Secondary | ICD-10-CM | POA: Insufficient documentation

## 2011-07-09 DIAGNOSIS — Z79899 Other long term (current) drug therapy: Secondary | ICD-10-CM

## 2011-07-09 DIAGNOSIS — I509 Heart failure, unspecified: Secondary | ICD-10-CM

## 2011-07-09 DIAGNOSIS — F1010 Alcohol abuse, uncomplicated: Secondary | ICD-10-CM

## 2011-07-09 DIAGNOSIS — I2510 Atherosclerotic heart disease of native coronary artery without angina pectoris: Secondary | ICD-10-CM

## 2011-07-09 LAB — LIPID PANEL
Cholesterol: 206 mg/dL — ABNORMAL HIGH (ref 0–200)
HDL: 47 mg/dL (ref >39)
LDL Cholesterol: 116 mg/dL — ABNORMAL HIGH (ref 0–99)
Total CHOL/HDL Ratio: 4.4 Ratio
Triglycerides: 217 mg/dL — ABNORMAL HIGH (ref <150)
VLDL: 43 mg/dL — ABNORMAL HIGH (ref 0–40)

## 2011-07-09 LAB — TESTOSTERONE: Testosterone: 218.5 ng/dL — ABNORMAL LOW (ref 250–890)

## 2011-07-09 MED ORDER — TESTOSTERONE 20.25 MG/ACT (1.62%) TD GEL: 2.0000 [IU] | TRANSDERMAL | Status: DC

## 2011-07-09 NOTE — Progress Notes (Signed)
  Subjective:    Patient ID: Aaron Berg, male    DOB: 07/08/1936, 75 y.o.   MRN: [id number]  HPI He is here for medication check. He recently saw his cardiologist and apparently the evaluation did show evidence of CHF. Presently his Altace has been increased to 20 mg. He continues on other medications listed in the chart. He does have sleep apnea but has not had this evaluated recently. He also has concerns over depression. He has not been on his AndroGel in several months. His exercise is been quite minimal. He does not smoke and does drink a half gallon every several days. His exercise is quite minimal do to bilateral knee pain.   Review of Systems Negative except as above    Objective:   Physical Exam alert and in no distress. Tympanic membranes and canals are normal. Throat is clear. Tonsils are normal. Neck is supple without adenopathy or thyromegaly. Cardiac exam shows a regular sinus rhythm with a 1/6 SEM,no gallops. Lungs are clear to auscultation. DTRs are normal       Assessment & Plan:  ASHD with CHF. Hypertension. Sleep apnea. Alcohol abuse. Hypogonadism .arthritis. Discussed starting back on AndroGel which we will do. He will return here in one month. I will reevaluate for possible depression after his testosterone levels back into the normal range. Encouraged him to cut back on his alcohol consumption to one or 2 beverages per day. I will get a CPAP readout. Routine blood screening also ordered.

## 2011-07-09 NOTE — Patient Instructions (Signed)
Stay on all your present medications. Cut back on alcohol consumption to one or 2 beverages per night.

## 2011-07-10 ENCOUNTER — Telehealth: Payer: Self-pay

## 2011-07-10 ENCOUNTER — Other Ambulatory Visit: Payer: Self-pay

## 2011-07-10 LAB — PSA, MEDICARE: PSA: 2.22 ng/mL (ref <=4.00)

## 2011-07-10 MED ORDER — HYDROCHLOROTHIAZIDE 25 MG PO TABS: 25.0000 mg | ORAL_TABLET | Freq: Every day | ORAL | Status: DC

## 2011-07-10 NOTE — Telephone Encounter (Signed)
Pt informed and mailed diet info 

## 2011-07-10 NOTE — Telephone Encounter (Signed)
Called pt to give info about labs left message for pt to call back

## 2011-07-15 ENCOUNTER — Telehealth: Payer: Self-pay | Admitting: Family Medicine

## 2011-07-15 NOTE — Telephone Encounter (Signed)
Called med in again androgel 1.62 % 2 pumps qd  90-day with 1 rf

## 2011-08-28 ENCOUNTER — Other Ambulatory Visit: Payer: Self-pay | Admitting: Family Medicine

## 2011-09-13 ENCOUNTER — Other Ambulatory Visit: Payer: Self-pay | Admitting: Family Medicine

## 2012-01-01 ENCOUNTER — Encounter: Payer: Self-pay | Admitting: Internal Medicine

## 2012-01-12 ENCOUNTER — Ambulatory Visit (INDEPENDENT_AMBULATORY_CARE_PROVIDER_SITE_OTHER): Payer: Medicare Other | Admitting: Family Medicine

## 2012-01-12 ENCOUNTER — Encounter: Payer: Self-pay | Admitting: Family Medicine

## 2012-01-12 DIAGNOSIS — E291 Testicular hypofunction: Secondary | ICD-10-CM | POA: Diagnosis not present

## 2012-01-12 DIAGNOSIS — Z125 Encounter for screening for malignant neoplasm of prostate: Secondary | ICD-10-CM | POA: Diagnosis not present

## 2012-01-12 DIAGNOSIS — I2510 Atherosclerotic heart disease of native coronary artery without angina pectoris: Secondary | ICD-10-CM | POA: Diagnosis not present

## 2012-01-12 DIAGNOSIS — Z79899 Other long term (current) drug therapy: Secondary | ICD-10-CM

## 2012-01-12 DIAGNOSIS — I509 Heart failure, unspecified: Secondary | ICD-10-CM

## 2012-01-12 DIAGNOSIS — E785 Hyperlipidemia, unspecified: Secondary | ICD-10-CM

## 2012-01-12 DIAGNOSIS — F1010 Alcohol abuse, uncomplicated: Secondary | ICD-10-CM

## 2012-01-12 DIAGNOSIS — G4730 Sleep apnea, unspecified: Secondary | ICD-10-CM | POA: Diagnosis not present

## 2012-01-12 LAB — COMPREHENSIVE METABOLIC PANEL
AST: 36 U/L (ref 0–37)
Albumin: 4.5 g/dL (ref 3.5–5.2)
Alkaline Phosphatase: 44 U/L (ref 39–117)
Chloride: 101 mEq/L (ref 96–112)
Glucose, Bld: 78 mg/dL (ref 70–99)
Potassium: 4 mEq/L (ref 3.5–5.3)
Sodium: 139 mEq/L (ref 135–145)
Total Protein: 7 g/dL (ref 6.0–8.3)

## 2012-01-12 LAB — CBC WITH DIFFERENTIAL/PLATELET
Basophils Absolute: 0 10*3/uL (ref 0.0–0.1)
Basophils Relative: 0 % (ref 0–1)
Eosinophils Absolute: 0.1 10*3/uL (ref 0.0–0.7)
Eosinophils Relative: 2 % (ref 0–5)
HCT: 41.6 % (ref 39.0–52.0)
Hemoglobin: 13.7 g/dL (ref 13.0–17.0)
Lymphocytes Relative: 27 % (ref 12–46)
Lymphs Abs: 1.4 10*3/uL (ref 0.7–4.0)
MCH: 31.5 pg (ref 26.0–34.0)
MCHC: 32.9 g/dL (ref 30.0–36.0)
MCV: 95.6 fL (ref 78.0–100.0)
Monocytes Absolute: 0.5 10*3/uL (ref 0.1–1.0)
Monocytes Relative: 9 % (ref 3–12)
Neutro Abs: 3.3 10*3/uL (ref 1.7–7.7)
Neutrophils Relative %: 62 % (ref 43–77)
Platelets: 134 10*3/uL — ABNORMAL LOW (ref 150–400)
RBC: 4.35 MIL/uL (ref 4.22–5.81)
RDW: 14.2 % (ref 11.5–15.5)
WBC: 5.3 10*3/uL (ref 4.0–10.5)

## 2012-01-12 LAB — LIPID PANEL: LDL Cholesterol: 80 mg/dL (ref 0–99)

## 2012-01-12 LAB — TESTOSTERONE: Testosterone: 559.83 ng/dL (ref 250–890)

## 2012-01-12 NOTE — Patient Instructions (Signed)
Reduce your fluid intake specifically alcohol at night to see if this will help with your nighttime urination.

## 2012-01-12 NOTE — Progress Notes (Signed)
  Subjective:    Patient ID: Aaron Berg, male    DOB: 01-May-1936, 76 y.o.   MRN: [id number]  HPI He is here for a medication followup. He continues on medications listed in the chart He last saw his cardiologist in July of 2012. At that time he apparently was complaining of leg pain and did have a lower extremity ultrasound which he was told was negative. He complains of numbness in the left foot however further questioning was very difficult. He states that this is intermittent in nature. He has not fallen or tripped. He does find that walking using an assisted appliance does help and also helps prevent the leg discomfort. He has also had some difficulty with sleep disturbance stating he can wake up after 2 or 3 hours and has difficulty falling back asleep. He continues on testosterone replacement He will usually urinate at that time and does state he has hesitancy but not decreased stream or incomplete emptying. He continues to drink 3 or 4 bourbons per night. He continues to complain of difficulty with balance although he states he has had an extensive workup by Dr. Sandria Manly concerning this. He continues to have some low back pain with a previous history of a cyst in his spine . He also did complain of knee pain but further discussion indicates it's really more by discomfort. He has noted intermittent bleeding per rectum, usually when he wipes. He does not complaining of chest pain, cough, increased shortness of breath, DOE, PND, numbness, tingling or weakness in his lower extremities.   Review of Systems Negative except as above    Objective:   Physical Exam alert and in no distress. Tympanic membranes and canals are normal. Throat is clear. Tonsils are normal. Neck is supple without adenopathy or thyromegaly. Cardiac exam shows a regular sinus rhythm without murmurs or gallops. Lungs are clear to auscultation. Lower extremity exam shows good strength with normal pulses       Assessment & Plan:    1. ASHD (arteriosclerotic heart disease)  CBC with Differential, Comprehensive metabolic panel, Lipid panel  2. CHF (congestive heart failure)  CBC with Differential, Comprehensive metabolic panel, Lipid panel  3. Hyperlipidemia LDL goal < 70  Comprehensive metabolic panel  4. Sleep apnea    5. Alcohol abuse, daily use    6. Hypogonadism male  Testosterone  7. Encounter for long-term (current) use of other medications  CBC with Differential, Comprehensive metabolic panel, Lipid panel, Testosterone  8. Special screening for malignant neoplasm of prostate  PSA, Medicare   I encouraged him to cut back on fluids at night specifically alcohol. Recommend he also emptying his bladder at night. He will followup with his cardiologist and discuss the thigh discomfort with him. We discussed colonoscopy with him however he is made no decision .

## 2012-01-27 ENCOUNTER — Other Ambulatory Visit: Payer: Self-pay | Admitting: Family Medicine

## 2012-04-13 DIAGNOSIS — I10 Essential (primary) hypertension: Secondary | ICD-10-CM | POA: Diagnosis not present

## 2012-04-13 DIAGNOSIS — I2510 Atherosclerotic heart disease of native coronary artery without angina pectoris: Secondary | ICD-10-CM | POA: Diagnosis not present

## 2012-04-13 DIAGNOSIS — E785 Hyperlipidemia, unspecified: Secondary | ICD-10-CM | POA: Diagnosis not present

## 2012-04-13 DIAGNOSIS — I5022 Chronic systolic (congestive) heart failure: Secondary | ICD-10-CM | POA: Diagnosis not present

## 2012-05-31 DIAGNOSIS — H31009 Unspecified chorioretinal scars, unspecified eye: Secondary | ICD-10-CM | POA: Diagnosis not present

## 2012-05-31 DIAGNOSIS — H5315 Visual distortions of shape and size: Secondary | ICD-10-CM | POA: Diagnosis not present

## 2012-05-31 DIAGNOSIS — H04129 Dry eye syndrome of unspecified lacrimal gland: Secondary | ICD-10-CM | POA: Diagnosis not present

## 2012-05-31 DIAGNOSIS — H2640 Unspecified secondary cataract: Secondary | ICD-10-CM | POA: Diagnosis not present

## 2012-09-07 ENCOUNTER — Other Ambulatory Visit: Payer: Self-pay | Admitting: Family Medicine

## 2012-09-16 ENCOUNTER — Telehealth: Payer: Self-pay | Admitting: Internal Medicine

## 2012-09-16 MED ORDER — TESTOSTERONE 20.25 MG/ACT (1.62%) TD GEL: 2.0000 [IU] | TRANSDERMAL | Status: DC

## 2012-09-16 NOTE — Telephone Encounter (Signed)
Cover him until he can get an appointment to see me

## 2012-09-16 NOTE — Telephone Encounter (Signed)
pt needs a refill on testosterone androgel pump 1.62%. please send to [facility]. pt wants med from [facility] mailed to [employer] address.[name] [place] [address]

## 2012-09-16 NOTE — Telephone Encounter (Signed)
FAXED TO [facility] AND NEW ADDRESS PUT ON FAX

## 2012-12-31 ENCOUNTER — Other Ambulatory Visit: Payer: Self-pay | Admitting: Family Medicine

## 2013-01-03 ENCOUNTER — Telehealth: Payer: Self-pay | Admitting: Internal Medicine

## 2013-01-03 NOTE — Telephone Encounter (Signed)
He has not been seen since January 2013. He needs an appointment. Make sure he has been taking it for the last year.

## 2013-01-03 NOTE — Telephone Encounter (Signed)
Dr.lalnde his phone is not working

## 2013-01-03 NOTE — Telephone Encounter (Signed)
sabrina he phone # is not working I guess denied it and he will have to call

## 2013-01-04 NOTE — Telephone Encounter (Signed)
Denied his rx

## 2013-01-06 ENCOUNTER — Telehealth: Payer: Self-pay | Admitting: Family Medicine

## 2013-01-07 ENCOUNTER — Telehealth: Payer: Self-pay | Admitting: Family Medicine

## 2013-01-07 NOTE — Telephone Encounter (Signed)
APPT MADE

## 2013-01-10 ENCOUNTER — Encounter: Payer: Self-pay | Admitting: Family Medicine

## 2013-01-10 ENCOUNTER — Ambulatory Visit (INDEPENDENT_AMBULATORY_CARE_PROVIDER_SITE_OTHER): Payer: Medicare Other | Admitting: Family Medicine

## 2013-01-10 VITALS — BP 120/78 | HR 93 | Wt 262.0 lb

## 2013-01-10 DIAGNOSIS — R5383 Other fatigue: Secondary | ICD-10-CM

## 2013-01-10 DIAGNOSIS — Z79899 Other long term (current) drug therapy: Secondary | ICD-10-CM

## 2013-01-10 DIAGNOSIS — R5381 Other malaise: Secondary | ICD-10-CM | POA: Diagnosis not present

## 2013-01-10 DIAGNOSIS — I2510 Atherosclerotic heart disease of native coronary artery without angina pectoris: Secondary | ICD-10-CM | POA: Diagnosis not present

## 2013-01-10 DIAGNOSIS — G4730 Sleep apnea, unspecified: Secondary | ICD-10-CM | POA: Diagnosis not present

## 2013-01-10 DIAGNOSIS — M129 Arthropathy, unspecified: Secondary | ICD-10-CM

## 2013-01-10 DIAGNOSIS — E785 Hyperlipidemia, unspecified: Secondary | ICD-10-CM

## 2013-01-10 DIAGNOSIS — E291 Testicular hypofunction: Secondary | ICD-10-CM

## 2013-01-10 DIAGNOSIS — Z23 Encounter for immunization: Secondary | ICD-10-CM | POA: Diagnosis not present

## 2013-01-10 DIAGNOSIS — Z125 Encounter for screening for malignant neoplasm of prostate: Secondary | ICD-10-CM

## 2013-01-10 DIAGNOSIS — L219 Seborrheic dermatitis, unspecified: Secondary | ICD-10-CM

## 2013-01-10 DIAGNOSIS — M1990 Unspecified osteoarthritis, unspecified site: Secondary | ICD-10-CM

## 2013-01-10 DIAGNOSIS — F1010 Alcohol abuse, uncomplicated: Secondary | ICD-10-CM

## 2013-01-10 LAB — CBC WITH DIFFERENTIAL/PLATELET
Basophils Absolute: 0 10*3/uL (ref 0.0–0.1)
Eosinophils Absolute: 0.1 10*3/uL (ref 0.0–0.7)
Eosinophils Relative: 1 % (ref 0–5)
HCT: 40.8 % (ref 39.0–52.0)
Lymphocytes Relative: 23 % (ref 12–46)
Lymphs Abs: 1.7 10*3/uL (ref 0.7–4.0)
MCH: 31.8 pg (ref 26.0–34.0)
MCHC: 34.6 g/dL (ref 30.0–36.0)
MCV: 92.1 fL (ref 78.0–100.0)
Monocytes Absolute: 0.6 10*3/uL (ref 0.1–1.0)
Neutrophils Relative %: 67 % (ref 43–77)
Platelets: 148 10*3/uL — ABNORMAL LOW (ref 150–400)
RBC: 4.43 MIL/uL (ref 4.22–5.81)
RDW: 14.4 % (ref 11.5–15.5)
WBC: 7.2 10*3/uL (ref 4.0–10.5)

## 2013-01-10 LAB — COMPREHENSIVE METABOLIC PANEL
BUN: 30 mg/dL — ABNORMAL HIGH (ref 6–23)
CO2: 24 mEq/L (ref 19–32)
Calcium: 10.1 mg/dL (ref 8.4–10.5)
Chloride: 104 mEq/L (ref 96–112)
Creat: 1.23 mg/dL (ref 0.50–1.35)
Glucose, Bld: 87 mg/dL (ref 70–99)

## 2013-01-10 LAB — PSA, MEDICARE: PSA: 3.12 ng/mL (ref <=4.00)

## 2013-01-10 LAB — LIPID PANEL
Cholesterol: 184 mg/dL (ref 0–200)
HDL: 52 mg/dL (ref >39)

## 2013-01-10 MED ORDER — INFLUENZA VIRUS VACC SPLIT PF IM SUSP: 0.5000 mL | Freq: Once | INTRAMUSCULAR | Status: DC

## 2013-01-10 NOTE — Telephone Encounter (Signed)
APPT MADE

## 2013-01-10 NOTE — Progress Notes (Signed)
  Subjective:    Patient ID: Aaron Berg, male    DOB: 01-Dec-1936, 77 y.o.   MRN: [id number]  HPI He is here to discuss multiple issues. He has not been here in quite some time. He has not followed up with his cardiologist as well. He does have difficulty with bilateral knee pain and is apparently considering having knee replacement. This does interfere with his ability to remain active. He does complain of fatigue as well as daytime sleepiness. He does have sleep apnea and states he does use a CPAP machine. He has not had this checked in at least a year. He continues on medications listed in the chart including his testosterone. Again he has not had this checked in quite some time. He has had no chest pain, shortness of breath. He states that he did have his cardiac medications readjusted when he saw his cardiologist last year but that did not get followup. He does note an irregular heartbeat. He has had no PND or orthopnea, DOE, pedal edema. He admits to drinking at least 3 drinks per night stating that he has cut down to that amount. He also complains of scaling and dryness to his scalp especially eyebrow is and at the hairline. He also has lesions present on his legs.   Review of Systems Negative except as above    Objective:   Physical Exam alert and in no distress. Tympanic membranes and canals are normal. Throat is clear. Tonsils are normal. Neck is supple without adenopathy or thyromegaly. Cardiac exam shows an irregular  rhythm with a2/6 SEM murmur no gallops. Lungs are clear to auscultation. Exam of the scalp does show erythema and scaling at the hairline as well as eyebrows and scattered erythematous lesions on his lower legs. EKG shows PVCs       Assessment & Plan:   1. Need for prophylactic vaccination and inoculation against influenza  influenza  inactive virus vaccine (FLUZONE/FLUARIX) injection 0.5 mL  2. Fatigue  TSH  3. Sleep apnea    4. Hyperlipidemia LDL goal < 70   Lipid panel  5. Hypogonadism male  Testosterone, PSA, Medicare  6. Arthritis    7. Alcohol abuse, daily use    8. ASHD (arteriosclerotic heart disease)  Lipid panel, CBC with Differential, Comprehensive metabolic panel, EKG 12-Lead  9. Encounter for long-term (current) use of other medications  Lipid panel, CBC with Differential, Comprehensive metabolic panel  10. Special screening for malignant neoplasm of prostate  PSA, Medicare  11. Seborrheic dermatitis  Ambulatory referral to Dermatology   knee replacement was discussed with him. He is considering having both knees done at the same time. I discussed this at length with him especially since he will need to be in rehabilitation for long period time. Discussed this in regard to his drinking habits. We will get a CPAP readout. Routine blood screening including testosterone and PSA. Refer to dermatology for his seborrheic dermatitis. Discussed increasing his physical activity especially after he gets his knees replaced. Over 45 minutes spent discussing all these issues with him. Also recommended cardiology followup.

## 2013-01-11 ENCOUNTER — Encounter: Payer: Self-pay | Admitting: Family Medicine

## 2013-01-11 LAB — TSH: TSH: 1.331 u[IU]/mL (ref 0.350–4.500)

## 2013-01-12 LAB — TESTOSTERONE: Testosterone: 192.75 ng/dL — ABNORMAL LOW (ref 300–890)

## 2013-01-13 ENCOUNTER — Other Ambulatory Visit: Payer: Medicare Other

## 2013-01-15 ENCOUNTER — Encounter: Payer: Self-pay | Admitting: Family Medicine

## 2013-01-20 ENCOUNTER — Other Ambulatory Visit (INDEPENDENT_AMBULATORY_CARE_PROVIDER_SITE_OTHER): Payer: Medicare Other

## 2013-01-20 DIAGNOSIS — Z23 Encounter for immunization: Secondary | ICD-10-CM

## 2013-01-20 DIAGNOSIS — L219 Seborrheic dermatitis, unspecified: Secondary | ICD-10-CM | POA: Diagnosis not present

## 2013-01-20 DIAGNOSIS — L259 Unspecified contact dermatitis, unspecified cause: Secondary | ICD-10-CM | POA: Diagnosis not present

## 2013-01-20 DIAGNOSIS — D485 Neoplasm of uncertain behavior of skin: Secondary | ICD-10-CM | POA: Diagnosis not present

## 2013-01-21 ENCOUNTER — Encounter: Payer: Self-pay | Admitting: Family Medicine

## 2013-01-24 ENCOUNTER — Other Ambulatory Visit: Payer: Self-pay

## 2013-01-24 ENCOUNTER — Other Ambulatory Visit: Payer: Self-pay | Admitting: Family Medicine

## 2013-01-24 ENCOUNTER — Encounter: Payer: Self-pay | Admitting: Family Medicine

## 2013-01-24 MED ORDER — TESTOSTERONE 20.25 MG/ACT (1.62%) TD GEL: 2.0000 [IU] | TRANSDERMAL | Status: DC

## 2013-01-24 NOTE — Telephone Encounter (Signed)
[facility] 

## 2013-03-30 ENCOUNTER — Other Ambulatory Visit: Payer: Self-pay | Admitting: Family Medicine

## 2013-04-12 DIAGNOSIS — I2510 Atherosclerotic heart disease of native coronary artery without angina pectoris: Secondary | ICD-10-CM | POA: Diagnosis not present

## 2013-04-12 DIAGNOSIS — I5022 Chronic systolic (congestive) heart failure: Secondary | ICD-10-CM | POA: Diagnosis not present

## 2013-04-12 DIAGNOSIS — Z01810 Encounter for preprocedural cardiovascular examination: Secondary | ICD-10-CM | POA: Diagnosis not present

## 2013-04-12 DIAGNOSIS — I10 Essential (primary) hypertension: Secondary | ICD-10-CM | POA: Diagnosis not present

## 2013-04-12 DIAGNOSIS — I359 Nonrheumatic aortic valve disorder, unspecified: Secondary | ICD-10-CM | POA: Diagnosis not present

## 2013-04-12 DIAGNOSIS — E785 Hyperlipidemia, unspecified: Secondary | ICD-10-CM | POA: Diagnosis not present

## 2013-04-20 DIAGNOSIS — I5022 Chronic systolic (congestive) heart failure: Secondary | ICD-10-CM | POA: Diagnosis not present

## 2013-04-20 DIAGNOSIS — E785 Hyperlipidemia, unspecified: Secondary | ICD-10-CM | POA: Diagnosis not present

## 2013-04-20 DIAGNOSIS — I359 Nonrheumatic aortic valve disorder, unspecified: Secondary | ICD-10-CM | POA: Diagnosis not present

## 2013-04-20 DIAGNOSIS — I10 Essential (primary) hypertension: Secondary | ICD-10-CM | POA: Diagnosis not present

## 2013-04-20 DIAGNOSIS — I2510 Atherosclerotic heart disease of native coronary artery without angina pectoris: Secondary | ICD-10-CM | POA: Diagnosis not present

## 2013-07-15 ENCOUNTER — Other Ambulatory Visit: Payer: Self-pay | Admitting: Family Medicine

## 2013-08-04 DIAGNOSIS — M545 Low back pain: Secondary | ICD-10-CM | POA: Diagnosis not present

## 2013-08-08 ENCOUNTER — Other Ambulatory Visit: Payer: Self-pay | Admitting: Neurosurgery

## 2013-08-08 DIAGNOSIS — M545 Low back pain, unspecified: Secondary | ICD-10-CM

## 2013-08-09 ENCOUNTER — Telehealth: Payer: Self-pay | Admitting: Family Medicine

## 2013-08-09 NOTE — Telephone Encounter (Signed)
I spoke with the patients daughter and made her aware about [place] stopping his medication. CLS

## 2013-08-09 NOTE — Telephone Encounter (Signed)
LMOM TO CB. CLS 

## 2013-08-09 NOTE — Telephone Encounter (Signed)
Message copied by Janeice Robinson on Tue Aug 09, 2013  3:03 PM ------      Message from: Jac Canavan      Created: Mon Aug 08, 2013  7:55 PM       pls let pt know that [name] wants to have him stop his Plavix temporarily to perform the myelogram of his back.   Anytime we stop Plavix, there is always potential risks for clot or thrombus which could cause stroke/MI.   However, the benefits (getting the test done) may outweigh the risks since this is a short term hold on the medication.            As long as he is aware of this and agreeable, we will let [facility] know that he will stop his plavix 5 days prior to his procedure.    ------

## 2013-08-18 ENCOUNTER — Ambulatory Visit
Admission: RE | Admit: 2013-08-18 | Discharge: 2013-08-18 | Disposition: A | Discharge status: Home / Self Care | Payer: Medicare Other | Source: Ambulatory Visit | Attending: Neurosurgery | Admitting: Neurosurgery

## 2013-08-18 VITALS — BP 85/46 | HR 67

## 2013-08-18 DIAGNOSIS — M545 Low back pain: Secondary | ICD-10-CM

## 2013-08-18 HISTORY — DX: Essential (primary) hypertension: I10

## 2013-08-18 HISTORY — DX: Chronic obstructive pulmonary disease, unspecified: J44.9

## 2013-08-18 MED ORDER — DIAZEPAM 5 MG PO TABS
5.0000 mg | ORAL_TABLET | Freq: Once | ORAL | Status: AC
  Administered 2013-08-18: 5 mg via ORAL

## 2013-08-18 MED ORDER — IOHEXOL 180 MG/ML  SOLN
15.0000 mL | Freq: Once | INTRAMUSCULAR | Status: AC | PRN
  Administered 2013-08-18: 15 mL via INTRATHECAL

## 2013-08-29 DIAGNOSIS — M4310 Spondylolisthesis, site unspecified: Secondary | ICD-10-CM | POA: Diagnosis not present

## 2013-08-30 ENCOUNTER — Telehealth: Payer: Self-pay | Admitting: Family Medicine

## 2013-08-30 DIAGNOSIS — I714 Abdominal aortic aneurysm, without rupture: Secondary | ICD-10-CM

## 2013-08-30 NOTE — Telephone Encounter (Signed)
Explained to him that the next step is to get the CT angiogram to determine the size. That will determine whether we need to send him to a vascular surgeon and not. Set this up

## 2013-08-30 NOTE — Telephone Encounter (Signed)
i have set pt up for CT angio and pt is notified however pt needs a bun and creatine level done before he can go and get the test done. No authorization is needed since pt is on plain medicare

## 2013-08-30 NOTE — Telephone Encounter (Signed)
Pt called and stated that Dr. Jeral Fruit sent pt for a millogram prior to some back surgery.  This showed that pt has a abdominal embolism. Dr. Jeral Fruit informed the pt. To contact you so you could refer pt for a CTA and a vascular surgeon. Pt states that is phone is not ringing but you can leave a message and he can call back.

## 2013-08-31 ENCOUNTER — Other Ambulatory Visit: Payer: Medicare Other

## 2013-08-31 DIAGNOSIS — I714 Abdominal aortic aneurysm, without rupture: Secondary | ICD-10-CM

## 2013-08-31 DIAGNOSIS — I2510 Atherosclerotic heart disease of native coronary artery without angina pectoris: Secondary | ICD-10-CM | POA: Diagnosis not present

## 2013-09-07 ENCOUNTER — Ambulatory Visit
Admission: RE | Admit: 2013-09-07 | Discharge: 2013-09-07 | Disposition: A | Discharge status: Home / Self Care | Payer: Medicare Other | Source: Ambulatory Visit | Attending: Family Medicine | Admitting: Family Medicine

## 2013-09-07 DIAGNOSIS — I714 Abdominal aortic aneurysm, without rupture: Secondary | ICD-10-CM

## 2013-09-07 MED ORDER — IOHEXOL 350 MG/ML SOLN
80.0000 mL | Freq: Once | INTRAVENOUS | Status: AC | PRN
  Administered 2013-09-07: 80 mL via INTRAVENOUS

## 2013-09-07 NOTE — Progress Notes (Signed)
Quick Note:  CALLED PATIENT HE WAS INFORMED OF CT RESULTS AND PT VERBALIZED UNDERSTANDING ______

## 2013-09-23 DIAGNOSIS — Z23 Encounter for immunization: Secondary | ICD-10-CM | POA: Diagnosis not present

## 2013-09-29 DIAGNOSIS — M4310 Spondylolisthesis, site unspecified: Secondary | ICD-10-CM | POA: Diagnosis not present

## 2013-09-29 DIAGNOSIS — E669 Obesity, unspecified: Secondary | ICD-10-CM | POA: Diagnosis not present

## 2013-09-30 ENCOUNTER — Telehealth: Payer: Self-pay | Admitting: Family Medicine

## 2013-09-30 NOTE — Telephone Encounter (Signed)
Daughter,Jeanne, states pt is going to have lumbar fusion surgery with Dr Jeral Fruit if Dr Susann Givens approves for pt to be off of Plavix for 1 week. Please send this approve to Dr Jeral Fruit so that surgery can be scheduled & let daughter know if and when this is done. Also pt will need to be off of Aleve 1 week before surgery. Can Dr Susann Givens suggest anything else pt can take in place of Aleve?

## 2013-10-01 ENCOUNTER — Other Ambulatory Visit: Payer: Self-pay | Admitting: Family Medicine

## 2013-10-01 NOTE — Telephone Encounter (Signed)
Have him come in for an appointment. It has been over a year since I saw him.

## 2013-10-03 NOTE — Telephone Encounter (Signed)
Talked to daughter gave her information she said she would inform her dad

## 2013-10-04 ENCOUNTER — Telehealth: Payer: Self-pay | Admitting: Medical

## 2013-10-04 ENCOUNTER — Encounter: Payer: Self-pay | Admitting: Medical

## 2013-10-04 ENCOUNTER — Ambulatory Visit (INDEPENDENT_AMBULATORY_CARE_PROVIDER_SITE_OTHER): Payer: Medicare Other | Admitting: Medical

## 2013-10-04 VITALS — BP 102/70 | HR 77 | Temp 98.1°F | Resp 16 | Wt 258.0 lb

## 2013-10-04 DIAGNOSIS — E291 Testicular hypofunction: Secondary | ICD-10-CM

## 2013-10-04 DIAGNOSIS — Z951 Presence of aortocoronary bypass graft: Secondary | ICD-10-CM

## 2013-10-04 DIAGNOSIS — I10 Essential (primary) hypertension: Secondary | ICD-10-CM | POA: Diagnosis not present

## 2013-10-04 DIAGNOSIS — E785 Hyperlipidemia, unspecified: Secondary | ICD-10-CM

## 2013-10-04 DIAGNOSIS — I2510 Atherosclerotic heart disease of native coronary artery without angina pectoris: Secondary | ICD-10-CM

## 2013-10-04 DIAGNOSIS — G4733 Obstructive sleep apnea (adult) (pediatric): Secondary | ICD-10-CM

## 2013-10-04 DIAGNOSIS — Z01818 Encounter for other preprocedural examination: Secondary | ICD-10-CM

## 2013-10-04 DIAGNOSIS — E669 Obesity, unspecified: Secondary | ICD-10-CM

## 2013-10-04 DIAGNOSIS — Z7902 Long term (current) use of antithrombotics/antiplatelets: Secondary | ICD-10-CM

## 2013-10-04 DIAGNOSIS — Z79899 Other long term (current) drug therapy: Secondary | ICD-10-CM

## 2013-10-04 MED ORDER — HYDROCODONE-ACETAMINOPHEN 5-325 MG PO TABS: 1.0000 | ORAL_TABLET | Freq: Three times a day (TID) | ORAL | Status: DC | PRN

## 2013-10-04 NOTE — Progress Notes (Signed)
Subjective: Here for surgery clearance.  Normally sees Dr. Susann Givens here, but this is my first time seeing him.   He is having lumbar fusion per Dr. Jeral Fruit soon, but was advised to f/u here for recheck since last visit over a year ago.   In general been doing ok.  No particularly c/o other than back pain. He notes that he has not been able to exercise due to back pain, using some diet discretion but does eat a steak at least once weekly.   Nonsmoker, hasn't smoked since the 1990s, but does drink 2-3 bourbon drinks nightly.    He has been using some Aleve OTC BID, but when he recently doubled up on Aleve started getting black stool.  He cut back on the Aleve and teh black stool resolved.    He is UTD on vaccines and is planning to get vaccine for shingles soon.   Medical Team: Dr. Jeral Fruit - spine doctor Cardiology, Dr. Katrinka Blazing Dr. Susann Givens, primary care.  He reports hx/o CAD, hx/o CHF, hx/o CABG.  Last cardiac eval 2 years ago.  Chart records show COPD, but he denies this diagnosis.  Hasn't smoked in years.  No current SOB, chest pain, edema, palpitations.  He is on CPAP, and his machine is 75 years old.    Lives alone.   Past Medical History  Diagnosis Date  . ASHD (arteriosclerotic heart disease)   . Dyslipidemia   . Obesity   . Arthritis   . Male hypogonadism   . Gait disorder   . COPD (chronic obstructive pulmonary disease)   . Hypertension   . Dysrhythmia 2003    AFib after CABG; warfarin x 6 months  . Sleep apnea     cpap  . CHF (congestive heart failure)     Past Surgical History  Procedure Laterality Date  . Lumbar cyst      Dr. Jeral Fruit  . Shoulder surgery      Right with murphy  . Coronary angioplasty  1991?    with stents placed  . Coronary artery bypass graft  2003    x 4  . Video assisted thoracoscopy (vats)/thorocotomy  2002    benign nodule  . Eye surgery Bilateral     cataract surgeries  . Eye surgery      detached retina  . Joint replacement      right  shoulder replaced    History   Social History  . Marital Status: Divorced    Spouse Name: N/A    Number of Children: N/A  . Years of Education: N/A   Occupational History  . Not on file.   Social History Main Topics  . Smoking status: Former Smoker    Quit date: 06/17/1989  . Smokeless tobacco: Never Used  . Alcohol Use: 18.0 oz/week    30 Shots of liquor per week  . Drug Use: No  . Sexual Activity: Not Currently   Other Topics Concern  . Not on file   Social History Narrative  . No narrative on file    No family history on file.  Current outpatient prescriptions:clopidogrel (PLAVIX) 75 MG tablet, TAKE 1 TABLET DAILY, Disp: 90 tablet, Rfl: 0;  CRESTOR 40 MG tablet, TAKE 1 TABLET DAILY, Disp: 90 tablet, Rfl: 0;  hydrochlorothiazide (HYDRODIURIL) 25 MG tablet, TAKE 1 TABLET DAILY, Disp: 90 tablet, Rfl: 0;  magnesium 30 MG tablet, Take 30 mg by mouth 2 (two) times daily., Disp: , Rfl: ;  metoprolol (TOPROL-XL) 100 MG  24 hr tablet, Take 100 mg by mouth daily.  , Disp: , Rfl:  Misc Natural Products (OSTEO BI-FLEX JOINT SHIELD PO), Take by mouth.  , Disp: , Rfl: ;  Multiple Vitamins-Minerals (MULTIVITAMIN WITH MINERALS) tablet, Take 1 tablet by mouth daily., Disp: , Rfl: ;  naproxen sodium (ANAPROX) 220 MG tablet, Take 220 mg by mouth 2 (two) times daily with a meal., Disp: , Rfl: ;  ramipril (ALTACE) 10 MG capsule, Take 10 mg by mouth 2 (two) times daily.  , Disp: , Rfl:  Testosterone (ANDROGEL PUMP) 20.25 MG/ACT (1.62%) GEL, Place 2 Units onto the skin 1 day or 1 dose., Disp: 225 g, Rfl: 1;  HYDROcodone-acetaminophen (NORCO/VICODIN) 5-325 MG per tablet, Take 1 tablet by mouth every 8 (eight) hours as needed for pain., Disp: 30 tablet, Rfl: 0  Allergies  Allergen Reactions  . Procardia [Nifedipine] Rash    All over body     Objective: BP 102/70  Pulse 77  Temp(Src) 98.1 F (36.7 C) (Oral)  Resp 16  Wt 258 lb (117.028 kg)  BMI 38.08 kg/m2  General appearance: alert, no  distress, WD/WN, obese white male, pleasant Skin: warm dry, scattered crusting small round lesions of neck and arms,possible AKs HEENT: normocephalic, sclerae anicteric, TMs pearly, nares patent, no discharge or erythema, pharynx normal Oral cavity: MMM, no lesions Neck: supple, no lymphadenopathy, no thyromegaly, no masses, no JVD, no bruits Chest vertical surgical scar Heart: 2/6 holosystolic murmur, heard throughout, normal S2 Lungs: CTA bilaterally, no wheezes, rhonchi, or rales Abdomen: +bs, obese, RLQ surgical scar, soft, small umbilical reducible hernia, otherwise non tender, non distended, no masses, no hepatomegaly, no splenomegaly Pulses: 2+ symmetric, upper and lower extremities, normal cap refill Ext: no edema, cyanosis, clubbing Neuro: CN2-12 intact, nonfocal exam  Assessment: Encounter Diagnoses  Name Primary?  . CAD (coronary artery disease) Yes  . Hx of CABG   . Essential hypertension, benign   . OSA on CPAP   . Hyperlipidemia   . Hypogonadism male   . Obesity, unspecified   . Preop examination   . Antiplatelet or antithrombotic long-term use   . Encounter for long-term (current) use of other medications    Plan: Return for fasting labs.  We will refer back to cardiology for routine cardiac checkup and surgery clearance.  We will request CPAP compliance report.  Advised he stop NSAIDs for now, gave short term hydrocodone to use prn.  Follow-up pending labs, referral, studies.

## 2013-10-04 NOTE — Telephone Encounter (Signed)
Refer back to cardiology for surgery clearance and general recheck.   Refer to home health/[facility] to get CPAP report

## 2013-10-05 ENCOUNTER — Telehealth: Payer: Self-pay

## 2013-10-05 ENCOUNTER — Other Ambulatory Visit: Payer: Self-pay

## 2013-10-05 ENCOUNTER — Encounter: Payer: Self-pay | Admitting: Family Medicine

## 2013-10-05 ENCOUNTER — Encounter: Payer: Self-pay | Admitting: Interventional Cardiology

## 2013-10-05 ENCOUNTER — Other Ambulatory Visit: Payer: Medicare Other

## 2013-10-05 DIAGNOSIS — G4733 Obstructive sleep apnea (adult) (pediatric): Secondary | ICD-10-CM

## 2013-10-05 DIAGNOSIS — Z79899 Other long term (current) drug therapy: Secondary | ICD-10-CM

## 2013-10-05 DIAGNOSIS — Z01818 Encounter for other preprocedural examination: Secondary | ICD-10-CM

## 2013-10-05 DIAGNOSIS — E291 Testicular hypofunction: Secondary | ICD-10-CM

## 2013-10-05 DIAGNOSIS — E785 Hyperlipidemia, unspecified: Secondary | ICD-10-CM | POA: Diagnosis not present

## 2013-10-05 DIAGNOSIS — Z951 Presence of aortocoronary bypass graft: Secondary | ICD-10-CM

## 2013-10-05 DIAGNOSIS — E669 Obesity, unspecified: Secondary | ICD-10-CM

## 2013-10-05 DIAGNOSIS — Z7902 Long term (current) use of antithrombotics/antiplatelets: Secondary | ICD-10-CM

## 2013-10-05 DIAGNOSIS — I2510 Atherosclerotic heart disease of native coronary artery without angina pectoris: Secondary | ICD-10-CM

## 2013-10-05 DIAGNOSIS — I10 Essential (primary) hypertension: Secondary | ICD-10-CM

## 2013-10-05 DIAGNOSIS — I509 Heart failure, unspecified: Secondary | ICD-10-CM

## 2013-10-05 LAB — CBC WITH DIFFERENTIAL/PLATELET
HCT: 38.6 % — ABNORMAL LOW (ref 39.0–52.0)
Hemoglobin: 13.2 g/dL (ref 13.0–17.0)
Lymphs Abs: 1.6 10*3/uL (ref 0.7–4.0)
Monocytes Absolute: 0.4 10*3/uL (ref 0.1–1.0)
Monocytes Relative: 7 % (ref 3–12)
Neutro Abs: 3.3 10*3/uL (ref 1.7–7.7)
Neutrophils Relative %: 62 % (ref 43–77)
RBC: 4.16 MIL/uL — ABNORMAL LOW (ref 4.22–5.81)

## 2013-10-05 LAB — COMPREHENSIVE METABOLIC PANEL
AST: 23 U/L (ref 0–37)
Albumin: 4.1 g/dL (ref 3.5–5.2)
Alkaline Phosphatase: 41 U/L (ref 39–117)
BUN: 27 mg/dL — ABNORMAL HIGH (ref 6–23)
Calcium: 10.4 mg/dL (ref 8.4–10.5)
Chloride: 105 mEq/L (ref 96–112)
Glucose, Bld: 87 mg/dL (ref 70–99)
Potassium: 4.1 mEq/L (ref 3.5–5.3)
Sodium: 141 mEq/L (ref 135–145)
Total Protein: 6.8 g/dL (ref 6.0–8.3)

## 2013-10-05 LAB — LIPID PANEL
Cholesterol: 182 mg/dL (ref 0–200)
LDL Cholesterol: 78 mg/dL (ref 0–99)
Triglycerides: 253 mg/dL — ABNORMAL HIGH (ref <150)
VLDL: 51 mg/dL — ABNORMAL HIGH (ref 0–40)

## 2013-10-05 NOTE — Telephone Encounter (Signed)
CALLED AND LEFT MESSAGE FOR PT THAT HE HAS APPOINTMENT ON Tuesday 28 TH AT 1:15 WITH DR.HENRY SMITH FOR SURGERY CLEARANCE AND THAT I HAD FAXED REQUEST TO [facility] TO GET A CPAP READ OUT

## 2013-10-05 NOTE — Telephone Encounter (Signed)
CALLED DR.SMITHS OFFICE AND SENT IN CPAP READOUT TO [facility]

## 2013-10-10 ENCOUNTER — Encounter: Payer: Self-pay | Admitting: Interventional Cardiology

## 2013-10-10 ENCOUNTER — Encounter: Payer: Self-pay | Admitting: *Deleted

## 2013-10-11 ENCOUNTER — Other Ambulatory Visit: Payer: Medicare Other

## 2013-10-11 ENCOUNTER — Ambulatory Visit (INDEPENDENT_AMBULATORY_CARE_PROVIDER_SITE_OTHER): Payer: Medicare Other | Admitting: Interventional Cardiology

## 2013-10-11 ENCOUNTER — Encounter: Payer: Self-pay | Admitting: Interventional Cardiology

## 2013-10-11 VITALS — BP 122/86 | HR 93 | Ht 68.5 in | Wt 247.0 lb

## 2013-10-11 DIAGNOSIS — E785 Hyperlipidemia, unspecified: Secondary | ICD-10-CM | POA: Diagnosis not present

## 2013-10-11 DIAGNOSIS — I2510 Atherosclerotic heart disease of native coronary artery without angina pectoris: Secondary | ICD-10-CM

## 2013-10-11 DIAGNOSIS — I509 Heart failure, unspecified: Secondary | ICD-10-CM | POA: Diagnosis not present

## 2013-10-11 DIAGNOSIS — Z01810 Encounter for preprocedural cardiovascular examination: Secondary | ICD-10-CM

## 2013-10-11 DIAGNOSIS — I5042 Chronic combined systolic (congestive) and diastolic (congestive) heart failure: Secondary | ICD-10-CM | POA: Diagnosis not present

## 2013-10-11 DIAGNOSIS — I10 Essential (primary) hypertension: Secondary | ICD-10-CM

## 2013-10-11 DIAGNOSIS — I5022 Chronic systolic (congestive) heart failure: Secondary | ICD-10-CM | POA: Insufficient documentation

## 2013-10-11 NOTE — Patient Instructions (Signed)
Your physician recommends that you continue on your current medications as directed. Please refer to the Current Medication list given to you today.  Your physician wants you to follow-up in: 1 year. You will receive a reminder letter in the mail two months in advance. If you don't receive a letter, please call our office to schedule the follow-up appointment.  

## 2013-10-11 NOTE — Progress Notes (Signed)
Patient ID: Aaron Berg, male   DOB: [birth year], 77 y.o.   MRN: [id number]    [address], [address] [place], [place]  [address] Phone: [phone] Fax:  [phone]  Date:  10/11/2013   ID:  Aaron Berg, DOB 1936-10-23, MRN [id number]  PCP:  Carollee Herter, MD   ASSESSMENT:  1. Coronary atherosclerotic heart disease, stable without angina. Patient is sedentary. CABG 2000 with coronary angiography performed 2012 demonstrating patent grafts 2. Chronic combined systolic and diastolic heart failure with last LVEF assess that 40% 2012 3. Hypertension, stable 4. Preoperative cardiovascular assessment, stable without decompensated cardiac issues  PLAN:  1. Patient would be cleared at this point for general anesthesia although he may have made changes in plans concerning the back operation. 2. Clinical followup with me in one year 3. No change in medical regimen   SUBJECTIVE: Aaron Berg is a 77 y.o. male with prior history of coronary artery disease with previous bypass surgery greater than 10 years ago. Coronary angiography in 2012 demonstrated patent grafts. He denies angina and cardiopulmonary complaints. He is quite sedentary, limited by lumbar and bilateral knee ailments. He was planning on having a lumbar fusion but now states that he may decide against. He has not needed nitroglycerin. He denies syncope. He denies palpitations. As the lower extremity swelling. No orthopnea or PND. He may decide to have his knees done before his back.   Wt Readings from Last 3 Encounters:  10/11/13 247 lb (112.038 kg)  10/04/13 258 lb (117.028 kg)  01/10/13 262 lb (118.842 kg)     Past Medical History  Diagnosis Date  . ASHD (arteriosclerotic heart disease)   . Dyslipidemia   . Obesity   . Arthritis   . Male hypogonadism   . Gait disorder   . COPD (chronic obstructive pulmonary disease)   . Hypertension   . Dysrhythmia 2003    AFib after CABG; warfarin x 6 months    . Sleep apnea     cpap  . CHF (congestive heart failure)     Current Outpatient Prescriptions  Medication Sig Dispense Refill  . clopidogrel (PLAVIX) 75 MG tablet TAKE 1 TABLET DAILY  90 tablet  0  . CRESTOR 40 MG tablet TAKE 1 TABLET DAILY  90 tablet  0  . hydrochlorothiazide (HYDRODIURIL) 25 MG tablet TAKE 1 TABLET DAILY  90 tablet  0  . HYDROcodone-acetaminophen (NORCO/VICODIN) 5-325 MG per tablet Take 1 tablet by mouth every 8 (eight) hours as needed for pain.  30 tablet  0  . magnesium 30 MG tablet Take 30 mg by mouth 2 (two) times daily.      . metoprolol (TOPROL-XL) 100 MG 24 hr tablet Take 100 mg by mouth daily.        . Misc Natural Products (OSTEO BI-FLEX JOINT SHIELD PO) Take by mouth.        . Multiple Vitamins-Minerals (MULTIVITAMIN WITH MINERALS) tablet Take 1 tablet by mouth daily.      . naproxen sodium (ANAPROX) 220 MG tablet Take 220 mg by mouth 2 (two) times daily with a meal.      . ramipril (ALTACE) 10 MG capsule Take 10 mg by mouth 2 (two) times daily.        . Testosterone (ANDROGEL PUMP) 20.25 MG/ACT (1.62%) GEL Place 2 Units onto the skin 1 day or 1 dose.  225 g  1   No current facility-administered medications for this visit.  Allergies:    Allergies  Allergen Reactions  . Procardia [Nifedipine] Rash    All over body    Social History:  The patient  reports that he quit smoking about 24 years ago. He has never used smokeless tobacco. He reports that he drinks about 18.0 ounces of alcohol per week. He reports that he does not use illicit drugs.   ROS:  Please see the history of present illness.   Denies neurological, pulmonary, and gastrointestinal complaints. Markedly limited by knee and back discomfort.   All other systems reviewed and negative.   OBJECTIVE: VS:  BP 122/86  Pulse 93  Ht 5' 8.5" (1.74 m)  Wt 247 lb (112.038 kg)  BMI 37.01 kg/m2 Well nourished, well developed, in no acute distress, obese especially abdominal HEENT: normal Neck:  JVD flat. Carotid bruit none  Cardiac:  normal S1, S2; RRR; with a 1/6 systolic murmur right upper sternal border Lungs:  clear to auscultation bilaterally, no wheezing, rhonchi or rales Abd: soft, nontender, no hepatomegaly Ext: Edema none. Pulses trace to 1+ bilateral upper and lower extremities. Skin: warm and dry Neuro:  CNs 2-12 intact, no focal abnormalities noted  EKG:  Normal sinus rhythm with old inferior infarct, nonspecific interventricular conduction delay, and PVCs       Signed, Darci Needle III, MD 10/11/2013 2:02 PM

## 2013-10-12 ENCOUNTER — Encounter: Payer: Self-pay | Admitting: Interventional Cardiology

## 2013-10-12 ENCOUNTER — Encounter: Payer: Self-pay | Admitting: Family Medicine

## 2013-10-16 ENCOUNTER — Encounter: Payer: Self-pay | Admitting: Family Medicine

## 2013-11-14 ENCOUNTER — Other Ambulatory Visit: Payer: Self-pay | Admitting: Interventional Cardiology

## 2013-11-14 ENCOUNTER — Other Ambulatory Visit: Payer: Self-pay | Admitting: Family Medicine

## 2013-11-25 ENCOUNTER — Telehealth: Payer: Self-pay | Admitting: Family Medicine

## 2013-11-25 NOTE — Telephone Encounter (Signed)
Get me his form and I will fill it out

## 2013-11-25 NOTE — Telephone Encounter (Signed)
ON YOUR DESK

## 2013-11-25 NOTE — Telephone Encounter (Signed)
Does he need to come back in again for surgery clearance?

## 2013-11-30 ENCOUNTER — Telehealth: Payer: Self-pay

## 2013-11-30 ENCOUNTER — Encounter: Payer: Self-pay | Admitting: Family Medicine

## 2013-11-30 NOTE — Telephone Encounter (Signed)
Cardiac clearance faxed to [facility] [phone]

## 2013-12-21 ENCOUNTER — Encounter (HOSPITAL_COMMUNITY): Payer: Self-pay | Admitting: Pharmacy Technician

## 2013-12-26 ENCOUNTER — Encounter (HOSPITAL_COMMUNITY)
Admission: RE | Admit: 2013-12-26 | Discharge: 2013-12-26 | Disposition: A | Discharge status: Home / Self Care | Payer: Medicare Other | Source: Ambulatory Visit | Attending: Orthopedic Surgery | Admitting: Orthopedic Surgery

## 2013-12-26 ENCOUNTER — Ambulatory Visit (HOSPITAL_COMMUNITY)
Admission: RE | Admit: 2013-12-26 | Discharge: 2013-12-26 | Disposition: A | Discharge status: Home / Self Care | Payer: Medicare Other | Source: Ambulatory Visit | Attending: Orthopedic Surgery | Admitting: Orthopedic Surgery

## 2013-12-26 ENCOUNTER — Encounter (HOSPITAL_COMMUNITY): Payer: Self-pay

## 2013-12-26 DIAGNOSIS — J4489 Other specified chronic obstructive pulmonary disease: Secondary | ICD-10-CM | POA: Insufficient documentation

## 2013-12-26 DIAGNOSIS — Z87891 Personal history of nicotine dependence: Secondary | ICD-10-CM | POA: Insufficient documentation

## 2013-12-26 DIAGNOSIS — Z01812 Encounter for preprocedural laboratory examination: Secondary | ICD-10-CM | POA: Diagnosis not present

## 2013-12-26 DIAGNOSIS — J449 Chronic obstructive pulmonary disease, unspecified: Secondary | ICD-10-CM | POA: Diagnosis not present

## 2013-12-26 DIAGNOSIS — R091 Pleurisy: Secondary | ICD-10-CM | POA: Insufficient documentation

## 2013-12-26 DIAGNOSIS — Z01818 Encounter for other preprocedural examination: Secondary | ICD-10-CM | POA: Insufficient documentation

## 2013-12-26 DIAGNOSIS — Z0183 Encounter for blood typing: Secondary | ICD-10-CM | POA: Insufficient documentation

## 2013-12-26 DIAGNOSIS — Z951 Presence of aortocoronary bypass graft: Secondary | ICD-10-CM | POA: Insufficient documentation

## 2013-12-26 DIAGNOSIS — M1710 Unilateral primary osteoarthritis, unspecified knee: Secondary | ICD-10-CM | POA: Insufficient documentation

## 2013-12-26 HISTORY — DX: Adverse effect of unspecified anesthetic, initial encounter: T41.45XA

## 2013-12-26 HISTORY — DX: Major depressive disorder, single episode, unspecified: F32.9

## 2013-12-26 HISTORY — DX: Depression, unspecified: F32.A

## 2013-12-26 HISTORY — DX: Other complications of anesthesia, initial encounter: T88.59XA

## 2013-12-26 HISTORY — DX: Dry eye syndrome of bilateral lacrimal glands: H04.123

## 2013-12-26 LAB — URINE MICROSCOPIC-ADD ON

## 2013-12-26 LAB — URINALYSIS, ROUTINE W REFLEX MICROSCOPIC
Bilirubin Urine: NEGATIVE
GLUCOSE, UA: NEGATIVE mg/dL
Hgb urine dipstick: NEGATIVE
Ketones, ur: NEGATIVE mg/dL
LEUKOCYTES UA: NEGATIVE
Nitrite: NEGATIVE
PROTEIN: 30 mg/dL — AB
SPECIFIC GRAVITY, URINE: 1.023 (ref 1.005–1.030)
Urobilinogen, UA: 0.2 mg/dL (ref 0.0–1.0)
pH: 5.5 (ref 5.0–8.0)

## 2013-12-26 LAB — CBC
HEMATOCRIT: 41.7 % (ref 39.0–52.0)
Hemoglobin: 14.3 g/dL (ref 13.0–17.0)
MCH: 32.8 pg (ref 26.0–34.0)
MCHC: 34.3 g/dL (ref 30.0–36.0)
MCV: 95.6 fL (ref 78.0–100.0)
PLATELETS: 134 10*3/uL — AB (ref 150–400)
RBC: 4.36 MIL/uL (ref 4.22–5.81)
RDW: 14.4 % (ref 11.5–15.5)
WBC: 8.8 10*3/uL (ref 4.0–10.5)

## 2013-12-26 LAB — BASIC METABOLIC PANEL
BUN: 26 mg/dL — ABNORMAL HIGH (ref 6–23)
CALCIUM: 10.4 mg/dL (ref 8.4–10.5)
CHLORIDE: 100 meq/L (ref 96–112)
CO2: 26 meq/L (ref 19–32)
CREATININE: 1.09 mg/dL (ref 0.50–1.35)
GFR calc Af Amer: 75 mL/min — ABNORMAL LOW (ref >90)
GFR calc non Af Amer: 64 mL/min — ABNORMAL LOW (ref >90)
GLUCOSE: 101 mg/dL — AB (ref 70–99)
Potassium: 4.6 mEq/L (ref 3.7–5.3)
Sodium: 141 mEq/L (ref 137–147)

## 2013-12-26 LAB — ABO/RH: ABO/RH(D): A POS

## 2013-12-26 LAB — PROTIME-INR
INR: 0.92 (ref 0.00–1.49)
Prothrombin Time: 12.2 seconds (ref 11.6–15.2)

## 2013-12-26 LAB — SURGICAL PCR SCREEN
MRSA, PCR: NEGATIVE
Staphylococcus aureus: NEGATIVE

## 2013-12-26 LAB — APTT: APTT: 42 s — AB (ref 24–37)

## 2013-12-26 NOTE — Patient Instructions (Signed)
[place]  12/26/2013   Your procedure is scheduled on: 01/02/14  Report to [facility] at 10:20 AM.  Call this number if you have problems the morning of surgery [phone]: [phone]   Remember:   Do not eat food or drink liquids After Midnight.     Take these medicines the morning of surgery with A SIP OF WATER: metoprolol, crestor, hydrocodone if needed   Do not wear jewelry, make-up or nail polish.  Do not wear lotions, powders, or perfumes. You may wear deodorant.  Do not shave 48 hours prior to surgery. Men may shave face and neck.  Do not bring valuables to the hospital.  Contacts, dentures or bridgework may not be worn into surgery.  Leave suitcase in the car. After surgery it may be brought to your room.  For patients admitted to the hospital, checkout time is 11:00 AM the day of discharge.    Please read over the following fact sheets that you were given: MRSA Information, incentive spirometry fact sheet, blood fact sheet Paulette Blanch, RN  pre op nurse call if needed [phone]    FAILURE TO [facility]   Patient Signature: ___________________________________________

## 2013-12-26 NOTE — Progress Notes (Signed)
Surgery clearance note Dr. Tamala Julian on chart, surgery clearance note Dr. Redmond School on chart, EKG 10/11/13 on EPIC

## 2013-12-28 NOTE — H&P (Signed)
TOTAL KNEE ADMISSION H&P  Patient is being admitted for right total knee arthroplasty.  Subjective:  Chief Complaint:  Right knee OA /  pain.  HPI: RAMEY SCHIFF, 78 y.o. male, has a history of pain and functional disability in the right knee due to arthritis and has failed non-surgical conservative treatments for greater than 12 weeks to include NSAID's and/or analgesics, corticosteriod injections, use of assistive devices and activity modification.  Onset of symptoms was gradual, starting 7+ years ago with gradually worsening course since that time. The patient noted no past surgery on the right knee(s).  Patient currently rates pain in the right knee(s) at 7 out of 10 with activity. Patient has worsening of pain with activity and weight bearing, pain that interferes with activities of daily living, pain with passive range of motion, crepitus and joint swelling.  Patient has evidence of periarticular osteophytes and joint space narrowing by imaging studies. There is no active infection.  Risks, benefits and expectations were discussed with the patient.  Risks including but not limited to the risk of anesthesia, blood clots, nerve damage, blood vessel damage, failure of the prosthesis, infection and up to and including death.  Patient understand the risks, benefits and expectations and wishes to proceed with surgery.   D/C Plans:   SNF  Post-op Meds:    No Rx given   Tranexamic Acid:   Not to be given  Decadron:    To be given  FYI:    ASA post-op  Norco post-op  CPAP   Patient Active Problem List   Diagnosis Date Noted  . Chronic combined systolic and diastolic CHF (congestive heart failure) 10/11/2013  . Essential hypertension, benign 10/11/2013  . ASHD (arteriosclerotic heart disease) 07/09/2011  . Hyperlipidemia LDL goal < 70 07/09/2011  . Sleep apnea 07/09/2011  . Alcohol abuse, daily use 07/09/2011  . Hypogonadism male 07/09/2011   Past Medical History  Diagnosis Date  .  ASHD (arteriosclerotic heart disease)   . Dyslipidemia     well controlled  . Obesity   . Arthritis   . Male hypogonadism   . Gait disorder   . Hypertension   . Dysrhythmia 2003    AFib after CABG; warfarin x 6 months  . Sleep apnea     cpap  . CHF (congestive heart failure)   . Dry eyes   . Complication of anesthesia     "woke up too soon"  . Depression   . COPD (chronic obstructive pulmonary disease)     "no signs or tests"  . Headache(784.0)     silent migraines- no pain just go blind for just a minute  . Cancer     skin    Past Surgical History  Procedure Laterality Date  . Lumbar cyst      Dr. Joya Salm  . Shoulder surgery      Right with murphy, reattach ligaments  . Coronary artery bypass graft  2003    x 4  . Video assisted thoracoscopy (vats)/thorocotomy  2002    benign nodule  . Joint replacement      right shoulder replaced  . Coronary angioplasty  1991    with stents placed  . Eye surgery Bilateral     cataract surgeries  . Eye surgery Right     detached retina  . Tonsillectomy  as child  . Appendectomy  as child  . Mohs surgery      on chest    No prescriptions prior  to admission   Allergies  Allergen Reactions  . Procardia [Nifedipine] Rash    All over body    History  Substance Use Topics  . Smoking status: Former Smoker -- 30 years    Types: Cigarettes    Quit date: 06/17/1989  . Smokeless tobacco: Never Used  . Alcohol Use: 0.0 oz/week     Comment: gallon over 5 days    Family History  Problem Relation Age of Onset  . Pancreatic cancer Father   . CAD Father   . CAD Mother      Review of Systems  Constitutional: Negative.   HENT: Positive for tinnitus.   Eyes: Negative.   Respiratory: Negative.   Cardiovascular: Negative.   Gastrointestinal: Negative.   Genitourinary: Negative.   Musculoskeletal: Positive for back pain, joint pain and myalgias.  Skin: Negative.   Neurological: Positive for headaches.  Endo/Heme/Allergies:  Negative.   Psychiatric/Behavioral: Positive for depression.    Objective:  Physical Exam  Constitutional: He is oriented to person, place, and time. He appears well-developed and well-nourished.  HENT:  Head: Normocephalic and atraumatic.  Mouth/Throat: Oropharynx is clear and moist.  Eyes: Pupils are equal, round, and reactive to light.  Neck: Neck supple. No JVD present. No tracheal deviation present. No thyromegaly present.  Cardiovascular: Normal rate, regular rhythm, normal heart sounds and intact distal pulses.   Respiratory: Effort normal and breath sounds normal. No stridor. No respiratory distress. He has no wheezes.  GI: Soft. There is no tenderness. There is no guarding.  Musculoskeletal:       Right knee: He exhibits decreased range of motion, swelling and bony tenderness. He exhibits no effusion, no ecchymosis, no deformity, no laceration and no erythema. Tenderness found.  Lymphadenopathy:    He has no cervical adenopathy.  Neurological: He is alert and oriented to person, place, and time.  Skin: Skin is warm and dry.  Psychiatric: He has a normal mood and affect.    Labs:   Estimated body mass index is 37.01 kg/(m^2) as calculated from the following:   Height as of 10/11/13: 5' 8.5" (1.74 m).   Weight as of 10/11/13: 112.038 kg (247 lb).   Imaging Review Plain radiographs demonstrate severe degenerative joint disease of the right knee(s). The overall alignment is  neutral. The bone quality appears to be good for age and reported activity level.  Assessment/Plan:  End stage arthritis, right knee   The patient history, physical examination, clinical judgment of the provider and imaging studies are consistent with end stage degenerative joint disease of the right knee(s) and total knee arthroplasty is deemed medically necessary. The treatment options including medical management, injection therapy arthroscopy and arthroplasty were discussed at length. The risks  and benefits of total knee arthroplasty were presented and reviewed. The risks due to aseptic loosening, infection, stiffness, patella tracking problems, thromboembolic complications and other imponderables were discussed. The patient acknowledged the explanation, agreed to proceed with the plan and consent was signed. Patient is being admitted for inpatient treatment for surgery, pain control, PT, OT, prophylactic antibiotics, VTE prophylaxis, progressive ambulation and ADL's and discharge planning. The patient is planning to be discharged to skilled nursing facility.    [facility]   PAC  12/28/2013, 4:25 PM

## 2014-01-02 ENCOUNTER — Encounter (HOSPITAL_COMMUNITY)
Admission: RE | Disposition: A | Discharge status: Skilled Nursing Facility | Payer: Self-pay | Source: Ambulatory Visit | Attending: Orthopedic Surgery

## 2014-01-02 ENCOUNTER — Encounter (HOSPITAL_COMMUNITY): Payer: Self-pay | Admitting: *Deleted

## 2014-01-02 ENCOUNTER — Encounter (HOSPITAL_COMMUNITY): Payer: Medicare Other | Admitting: Registered Nurse

## 2014-01-02 ENCOUNTER — Inpatient Hospital Stay (HOSPITAL_COMMUNITY): Payer: Medicare Other | Admitting: Registered Nurse

## 2014-01-02 ENCOUNTER — Inpatient Hospital Stay (HOSPITAL_COMMUNITY)
Admission: RE | Admit: 2014-01-02 | Discharge: 2014-01-05 | DRG: 470 | Disposition: A | Discharge status: Skilled Nursing Facility | Payer: Medicare Other | Source: Ambulatory Visit | Attending: Orthopedic Surgery | Admitting: Orthopedic Surgery

## 2014-01-02 DIAGNOSIS — I10 Essential (primary) hypertension: Secondary | ICD-10-CM | POA: Diagnosis present

## 2014-01-02 DIAGNOSIS — Z800 Family history of malignant neoplasm of digestive organs: Secondary | ICD-10-CM | POA: Diagnosis not present

## 2014-01-02 DIAGNOSIS — E291 Testicular hypofunction: Secondary | ICD-10-CM | POA: Diagnosis present

## 2014-01-02 DIAGNOSIS — M62838 Other muscle spasm: Secondary | ICD-10-CM | POA: Diagnosis not present

## 2014-01-02 DIAGNOSIS — Z6841 Body Mass Index (BMI) 40.0 and over, adult: Secondary | ICD-10-CM | POA: Diagnosis not present

## 2014-01-02 DIAGNOSIS — E785 Hyperlipidemia, unspecified: Secondary | ICD-10-CM | POA: Diagnosis present

## 2014-01-02 DIAGNOSIS — S99919A Unspecified injury of unspecified ankle, initial encounter: Secondary | ICD-10-CM | POA: Diagnosis not present

## 2014-01-02 DIAGNOSIS — J449 Chronic obstructive pulmonary disease, unspecified: Secondary | ICD-10-CM | POA: Diagnosis not present

## 2014-01-02 DIAGNOSIS — R269 Unspecified abnormalities of gait and mobility: Secondary | ICD-10-CM | POA: Diagnosis not present

## 2014-01-02 DIAGNOSIS — M1710 Unilateral primary osteoarthritis, unspecified knee: Principal | ICD-10-CM | POA: Diagnosis present

## 2014-01-02 DIAGNOSIS — IMO0002 Reserved for concepts with insufficient information to code with codable children: Secondary | ICD-10-CM | POA: Diagnosis not present

## 2014-01-02 DIAGNOSIS — I5042 Chronic combined systolic (congestive) and diastolic (congestive) heart failure: Secondary | ICD-10-CM | POA: Diagnosis not present

## 2014-01-02 DIAGNOSIS — M6580 Other synovitis and tenosynovitis, unspecified site: Secondary | ICD-10-CM | POA: Diagnosis present

## 2014-01-02 DIAGNOSIS — Z87891 Personal history of nicotine dependence: Secondary | ICD-10-CM

## 2014-01-02 DIAGNOSIS — D649 Anemia, unspecified: Secondary | ICD-10-CM | POA: Diagnosis not present

## 2014-01-02 DIAGNOSIS — G4730 Sleep apnea, unspecified: Secondary | ICD-10-CM | POA: Diagnosis present

## 2014-01-02 DIAGNOSIS — F3289 Other specified depressive episodes: Secondary | ICD-10-CM | POA: Diagnosis present

## 2014-01-02 DIAGNOSIS — I2510 Atherosclerotic heart disease of native coronary artery without angina pectoris: Secondary | ICD-10-CM | POA: Diagnosis present

## 2014-01-02 DIAGNOSIS — I4891 Unspecified atrial fibrillation: Secondary | ICD-10-CM | POA: Diagnosis present

## 2014-01-02 DIAGNOSIS — I509 Heart failure, unspecified: Secondary | ICD-10-CM | POA: Diagnosis present

## 2014-01-02 DIAGNOSIS — Z951 Presence of aortocoronary bypass graft: Secondary | ICD-10-CM | POA: Diagnosis not present

## 2014-01-02 DIAGNOSIS — F329 Major depressive disorder, single episode, unspecified: Secondary | ICD-10-CM | POA: Diagnosis present

## 2014-01-02 DIAGNOSIS — J4489 Other specified chronic obstructive pulmonary disease: Secondary | ICD-10-CM | POA: Diagnosis not present

## 2014-01-02 DIAGNOSIS — S8990XA Unspecified injury of unspecified lower leg, initial encounter: Secondary | ICD-10-CM | POA: Diagnosis not present

## 2014-01-02 DIAGNOSIS — Z8249 Family history of ischemic heart disease and other diseases of the circulatory system: Secondary | ICD-10-CM

## 2014-01-02 DIAGNOSIS — M1990 Unspecified osteoarthritis, unspecified site: Secondary | ICD-10-CM | POA: Diagnosis not present

## 2014-01-02 DIAGNOSIS — Z713 Dietary counseling and surveillance: Secondary | ICD-10-CM

## 2014-01-02 DIAGNOSIS — E669 Obesity, unspecified: Secondary | ICD-10-CM | POA: Diagnosis present

## 2014-01-02 DIAGNOSIS — Z96659 Presence of unspecified artificial knee joint: Secondary | ICD-10-CM | POA: Diagnosis not present

## 2014-01-02 DIAGNOSIS — Z471 Aftercare following joint replacement surgery: Secondary | ICD-10-CM | POA: Diagnosis not present

## 2014-01-02 DIAGNOSIS — D62 Acute posthemorrhagic anemia: Secondary | ICD-10-CM | POA: Diagnosis not present

## 2014-01-02 DIAGNOSIS — M6281 Muscle weakness (generalized): Secondary | ICD-10-CM | POA: Diagnosis not present

## 2014-01-02 DIAGNOSIS — M25569 Pain in unspecified knee: Secondary | ICD-10-CM | POA: Diagnosis not present

## 2014-01-02 HISTORY — PX: TOTAL KNEE ARTHROPLASTY: SHX125

## 2014-01-02 LAB — TYPE AND SCREEN
ABO/RH(D): A POS
Antibody Screen: NEGATIVE

## 2014-01-02 SURGERY — ARTHROPLASTY, KNEE, TOTAL
Anesthesia: Spinal | Site: Knee | Laterality: Right

## 2014-01-02 MED ORDER — DIPHENHYDRAMINE HCL 25 MG PO CAPS
25.0000 mg | ORAL_CAPSULE | Freq: Four times a day (QID) | ORAL | Status: DC | PRN
  Administered 2014-01-03: 25 mg via ORAL
  Filled 2014-01-02: qty 1

## 2014-01-02 MED ORDER — DOCUSATE SODIUM 100 MG PO CAPS
100.0000 mg | ORAL_CAPSULE | Freq: Two times a day (BID) | ORAL | Status: DC
  Administered 2014-01-02 – 2014-01-05 (×6): 100 mg via ORAL

## 2014-01-02 MED ORDER — 0.9 % SODIUM CHLORIDE (POUR BTL) OPTIME
TOPICAL | Status: DC | PRN
  Administered 2014-01-02: 1000 mL

## 2014-01-02 MED ORDER — HYDROCODONE-ACETAMINOPHEN 7.5-325 MG PO TABS
1.0000 | ORAL_TABLET | ORAL | Status: DC
  Filled 2014-01-02: qty 2

## 2014-01-02 MED ORDER — ZOLPIDEM TARTRATE 5 MG PO TABS: 5.0000 mg | ORAL_TABLET | Freq: Every evening | ORAL | Status: DC | PRN

## 2014-01-02 MED ORDER — METHOCARBAMOL 100 MG/ML IJ SOLN
500.0000 mg | Freq: Four times a day (QID) | INTRAVENOUS | Status: DC | PRN
  Filled 2014-01-02: qty 5

## 2014-01-02 MED ORDER — PROPOFOL 10 MG/ML IV BOLUS
INTRAVENOUS | Status: AC
  Filled 2014-01-02: qty 20

## 2014-01-02 MED ORDER — KETOROLAC TROMETHAMINE 30 MG/ML IJ SOLN
INTRAMUSCULAR | Status: DC | PRN
  Administered 2014-01-02: 30 mg

## 2014-01-02 MED ORDER — PROMETHAZINE HCL 25 MG/ML IJ SOLN: 6.2500 mg | INTRAMUSCULAR | Status: DC | PRN

## 2014-01-02 MED ORDER — ATORVASTATIN CALCIUM 80 MG PO TABS
80.0000 mg | ORAL_TABLET | Freq: Every day | ORAL | Status: DC
  Administered 2014-01-03 – 2014-01-04 (×2): 80 mg via ORAL
  Filled 2014-01-02 (×4): qty 1

## 2014-01-02 MED ORDER — BUPIVACAINE-EPINEPHRINE (PF) 0.25% -1:200000 IJ SOLN
INTRAMUSCULAR | Status: DC | PRN
  Administered 2014-01-02: 25 mL

## 2014-01-02 MED ORDER — BUPIVACAINE-EPINEPHRINE PF 0.25-1:200000 % IJ SOLN
INTRAMUSCULAR | Status: AC
Start: 2014-01-02 — End: 2014-01-02
  Filled 2014-01-02: qty 30

## 2014-01-02 MED ORDER — FLEET ENEMA 7-19 GM/118ML RE ENEM: 1.0000 | ENEMA | Freq: Once | RECTAL | Status: AC | PRN

## 2014-01-02 MED ORDER — PROPOFOL 10 MG/ML IV BOLUS
INTRAVENOUS | Status: DC | PRN
  Administered 2014-01-02: 20 mg via INTRAVENOUS
  Administered 2014-01-02: 50 mg via INTRAVENOUS

## 2014-01-02 MED ORDER — KETOROLAC TROMETHAMINE 30 MG/ML IJ SOLN
INTRAMUSCULAR | Status: AC
  Filled 2014-01-02: qty 1

## 2014-01-02 MED ORDER — MENTHOL 3 MG MT LOZG: 1.0000 | LOZENGE | OROMUCOSAL | Status: DC | PRN

## 2014-01-02 MED ORDER — FENTANYL CITRATE 0.05 MG/ML IJ SOLN: 25.0000 ug | INTRAMUSCULAR | Status: DC | PRN

## 2014-01-02 MED ORDER — PROPOFOL INFUSION 10 MG/ML OPTIME
INTRAVENOUS | Status: DC | PRN
  Administered 2014-01-02: 100 ug/kg/min via INTRAVENOUS

## 2014-01-02 MED ORDER — PHENYLEPHRINE 40 MCG/ML (10ML) SYRINGE FOR IV PUSH (FOR BLOOD PRESSURE SUPPORT)
PREFILLED_SYRINGE | INTRAVENOUS | Status: AC
  Filled 2014-01-02: qty 10

## 2014-01-02 MED ORDER — SPIRITUS FRUMENTI
1.0000 | Freq: Every evening | ORAL | Status: DC | PRN
Start: 2014-01-02 — End: 2014-01-05
  Administered 2014-01-02 – 2014-01-04 (×6): 1 via ORAL
  Filled 2014-01-02 (×10): qty 1

## 2014-01-02 MED ORDER — FENTANYL CITRATE 0.05 MG/ML IJ SOLN
INTRAMUSCULAR | Status: AC
  Filled 2014-01-02: qty 2

## 2014-01-02 MED ORDER — PHENYLEPHRINE HCL 10 MG/ML IJ SOLN
INTRAMUSCULAR | Status: AC
  Filled 2014-01-02: qty 1

## 2014-01-02 MED ORDER — MIDAZOLAM HCL 2 MG/2ML IJ SOLN
INTRAMUSCULAR | Status: AC
  Filled 2014-01-02: qty 2

## 2014-01-02 MED ORDER — ONDANSETRON HCL 4 MG/2ML IJ SOLN: 4.0000 mg | Freq: Four times a day (QID) | INTRAMUSCULAR | Status: DC | PRN

## 2014-01-02 MED ORDER — HYDROMORPHONE HCL PF 1 MG/ML IJ SOLN
0.5000 mg | INTRAMUSCULAR | Status: DC | PRN
  Administered 2014-01-02 – 2014-01-03 (×2): 1 mg via INTRAVENOUS
  Filled 2014-01-02 (×2): qty 1

## 2014-01-02 MED ORDER — LIDOCAINE HCL (CARDIAC) 20 MG/ML IV SOLN
INTRAVENOUS | Status: AC
  Filled 2014-01-02: qty 5

## 2014-01-02 MED ORDER — ONDANSETRON HCL 4 MG PO TABS: 4.0000 mg | ORAL_TABLET | Freq: Four times a day (QID) | ORAL | Status: DC | PRN

## 2014-01-02 MED ORDER — SODIUM CHLORIDE 0.9 % IR SOLN
Status: DC | PRN
  Administered 2014-01-02: 1000 mL

## 2014-01-02 MED ORDER — MIDAZOLAM HCL 5 MG/5ML IJ SOLN
INTRAMUSCULAR | Status: DC | PRN
  Administered 2014-01-02: 2 mg via INTRAVENOUS

## 2014-01-02 MED ORDER — CEFAZOLIN SODIUM-DEXTROSE 2-3 GM-% IV SOLR
INTRAVENOUS | Status: AC
  Filled 2014-01-02: qty 50

## 2014-01-02 MED ORDER — METOPROLOL SUCCINATE ER 100 MG PO TB24
100.0000 mg | ORAL_TABLET | Freq: Every day | ORAL | Status: DC
  Administered 2014-01-03 – 2014-01-05 (×3): 100 mg via ORAL
  Filled 2014-01-02 (×4): qty 1

## 2014-01-02 MED ORDER — LACTATED RINGERS IV SOLN
INTRAVENOUS | Status: DC | PRN
  Administered 2014-01-02 (×2): via INTRAVENOUS

## 2014-01-02 MED ORDER — PHENYLEPHRINE 40 MCG/ML (10ML) SYRINGE FOR IV PUSH (FOR BLOOD PRESSURE SUPPORT)
PREFILLED_SYRINGE | INTRAVENOUS | Status: AC
  Filled 2014-01-02: qty 20

## 2014-01-02 MED ORDER — SODIUM CHLORIDE 0.9 % IV SOLN
INTRAVENOUS | Status: DC
  Administered 2014-01-02 – 2014-01-03 (×2): via INTRAVENOUS
  Filled 2014-01-02 (×15): qty 1000

## 2014-01-02 MED ORDER — SODIUM CHLORIDE 0.9 % IJ SOLN
INTRAMUSCULAR | Status: AC
  Filled 2014-01-02: qty 20

## 2014-01-02 MED ORDER — MEPERIDINE HCL 50 MG/ML IJ SOLN: 6.2500 mg | INTRAMUSCULAR | Status: DC | PRN

## 2014-01-02 MED ORDER — PHENOL 1.4 % MT LIQD: 1.0000 | OROMUCOSAL | Status: DC | PRN

## 2014-01-02 MED ORDER — HYDROCODONE-ACETAMINOPHEN 7.5-325 MG PO TABS
1.0000 | ORAL_TABLET | ORAL | Status: DC
  Administered 2014-01-02 (×2): 2 via ORAL
  Administered 2014-01-03: 1 via ORAL
  Administered 2014-01-03 – 2014-01-05 (×11): 2 via ORAL
  Filled 2014-01-02 (×12): qty 2
  Filled 2014-01-02: qty 1
  Filled 2014-01-02: qty 2

## 2014-01-02 MED ORDER — CELECOXIB 200 MG PO CAPS
200.0000 mg | ORAL_CAPSULE | Freq: Two times a day (BID) | ORAL | Status: DC
  Administered 2014-01-02 – 2014-01-05 (×6): 200 mg via ORAL
  Filled 2014-01-02 (×7): qty 1

## 2014-01-02 MED ORDER — METHOCARBAMOL 500 MG PO TABS
500.0000 mg | ORAL_TABLET | Freq: Four times a day (QID) | ORAL | Status: DC | PRN
  Administered 2014-01-03 – 2014-01-04 (×6): 500 mg via ORAL
  Filled 2014-01-02 (×6): qty 1

## 2014-01-02 MED ORDER — POLYETHYLENE GLYCOL 3350 17 G PO PACK
17.0000 g | PACK | Freq: Two times a day (BID) | ORAL | Status: DC
  Administered 2014-01-04 – 2014-01-05 (×2): 17 g via ORAL

## 2014-01-02 MED ORDER — METOCLOPRAMIDE HCL 10 MG PO TABS: 5.0000 mg | ORAL_TABLET | Freq: Three times a day (TID) | ORAL | Status: DC | PRN

## 2014-01-02 MED ORDER — BISACODYL 10 MG RE SUPP: 10.0000 mg | Freq: Every day | RECTAL | Status: DC | PRN

## 2014-01-02 MED ORDER — CLOPIDOGREL BISULFATE 75 MG PO TABS
75.0000 mg | ORAL_TABLET | Freq: Every day | ORAL | Status: DC
  Administered 2014-01-03 – 2014-01-05 (×3): 75 mg via ORAL
  Filled 2014-01-02 (×4): qty 1

## 2014-01-02 MED ORDER — BUPIVACAINE IN DEXTROSE 0.75-8.25 % IT SOLN
INTRATHECAL | Status: DC | PRN
  Administered 2014-01-02: 2 mL via INTRATHECAL

## 2014-01-02 MED ORDER — CEFAZOLIN SODIUM-DEXTROSE 2-3 GM-% IV SOLR
2.0000 g | INTRAVENOUS | Status: AC
  Administered 2014-01-02: 2 g via INTRAVENOUS

## 2014-01-02 MED ORDER — HYDROCHLOROTHIAZIDE 25 MG PO TABS
25.0000 mg | ORAL_TABLET | Freq: Every day | ORAL | Status: DC
  Administered 2014-01-03 – 2014-01-05 (×3): 25 mg via ORAL
  Filled 2014-01-02 (×4): qty 1

## 2014-01-02 MED ORDER — ALUM & MAG HYDROXIDE-SIMETH 200-200-20 MG/5ML PO SUSP: 30.0000 mL | ORAL | Status: DC | PRN

## 2014-01-02 MED ORDER — METOCLOPRAMIDE HCL 5 MG/ML IJ SOLN: 5.0000 mg | Freq: Three times a day (TID) | INTRAMUSCULAR | Status: DC | PRN

## 2014-01-02 MED ORDER — LACTATED RINGERS IV SOLN
INTRAVENOUS | Status: DC
  Administered 2014-01-02: 1000 mL via INTRAVENOUS

## 2014-01-02 MED ORDER — PHENYLEPHRINE HCL 10 MG/ML IJ SOLN
INTRAMUSCULAR | Status: DC | PRN
  Administered 2014-01-02: 120 ug via INTRAVENOUS
  Administered 2014-01-02: 80 ug via INTRAVENOUS
  Administered 2014-01-02: 120 ug via INTRAVENOUS
  Administered 2014-01-02: 80 ug via INTRAVENOUS

## 2014-01-02 MED ORDER — PHENYLEPHRINE HCL 10 MG/ML IJ SOLN
10.0000 mg | INTRAVENOUS | Status: DC | PRN
  Administered 2014-01-02: 10 ug/min via INTRAVENOUS

## 2014-01-02 MED ORDER — BUPIVACAINE LIPOSOME 1.3 % IJ SUSP
20.0000 mL | Freq: Once | INTRAMUSCULAR | Status: DC
  Filled 2014-01-02: qty 20

## 2014-01-02 MED ORDER — FENTANYL CITRATE 0.05 MG/ML IJ SOLN
INTRAMUSCULAR | Status: DC | PRN
  Administered 2014-01-02: 100 ug via INTRAVENOUS

## 2014-01-02 MED ORDER — FERROUS SULFATE 325 (65 FE) MG PO TABS
325.0000 mg | ORAL_TABLET | Freq: Three times a day (TID) | ORAL | Status: DC
  Administered 2014-01-02 – 2014-01-05 (×7): 325 mg via ORAL
  Filled 2014-01-02 (×11): qty 1

## 2014-01-02 MED ORDER — DEXAMETHASONE SODIUM PHOSPHATE 10 MG/ML IJ SOLN
10.0000 mg | Freq: Once | INTRAMUSCULAR | Status: AC
  Administered 2014-01-03: 10 mg via INTRAVENOUS
  Filled 2014-01-02: qty 1

## 2014-01-02 MED ORDER — CEFAZOLIN SODIUM-DEXTROSE 2-3 GM-% IV SOLR
2.0000 g | Freq: Four times a day (QID) | INTRAVENOUS | Status: AC
  Administered 2014-01-02 – 2014-01-03 (×2): 2 g via INTRAVENOUS
  Filled 2014-01-02 (×2): qty 50

## 2014-01-02 MED ORDER — SODIUM CHLORIDE 0.9 % IJ SOLN
INTRAMUSCULAR | Status: DC | PRN
  Administered 2014-01-02: 14:00:00

## 2014-01-02 SURGICAL SUPPLY — 56 items
BAG ZIPLOCK 12X15 (MISCELLANEOUS) IMPLANT
BANDAGE ELASTIC 6 VELCRO ST LF (GAUZE/BANDAGES/DRESSINGS) ×3 IMPLANT
BANDAGE ESMARK 6X9 LF (GAUZE/BANDAGES/DRESSINGS) ×1 IMPLANT
BLADE SAW SGTL 13.0X1.19X90.0M (BLADE) ×3 IMPLANT
BNDG ESMARK 6X9 LF (GAUZE/BANDAGES/DRESSINGS) ×3
BOWL SMART MIX CTS (DISPOSABLE) ×3 IMPLANT
CAPT RP KNEE ×3 IMPLANT
CEMENT HV SMART SET (Cement) ×6 IMPLANT
CUFF TOURN SGL QUICK 34 (TOURNIQUET CUFF) ×2
CUFF TRNQT CYL 34X4X40X1 (TOURNIQUET CUFF) ×1 IMPLANT
DERMABOND ADVANCED (GAUZE/BANDAGES/DRESSINGS) ×2
DERMABOND ADVANCED .7 DNX12 (GAUZE/BANDAGES/DRESSINGS) ×1 IMPLANT
DRAPE EXTREMITY T 121X128X90 (DRAPE) ×3 IMPLANT
DRAPE POUCH INSTRU U-SHP 10X18 (DRAPES) ×3 IMPLANT
DRAPE U-SHAPE 47X51 STRL (DRAPES) ×3 IMPLANT
DRSG AQUACEL AG ADV 3.5X10 (GAUZE/BANDAGES/DRESSINGS) ×3 IMPLANT
DRSG TEGADERM 4X4.75 (GAUZE/BANDAGES/DRESSINGS) IMPLANT
DURAPREP 26ML APPLICATOR (WOUND CARE) ×6 IMPLANT
ELECT REM PT RETURN 9FT ADLT (ELECTROSURGICAL) ×3
ELECTRODE REM PT RTRN 9FT ADLT (ELECTROSURGICAL) ×1 IMPLANT
EVACUATOR 1/8 PVC DRAIN (DRAIN) IMPLANT
FACESHIELD LNG OPTICON STERILE (SAFETY) ×15 IMPLANT
GAUZE SPONGE 2X2 8PLY STRL LF (GAUZE/BANDAGES/DRESSINGS) IMPLANT
GLOVE BIOGEL PI IND STRL 7.5 (GLOVE) ×1 IMPLANT
GLOVE BIOGEL PI IND STRL 8 (GLOVE) ×1 IMPLANT
GLOVE BIOGEL PI INDICATOR 7.5 (GLOVE) ×2
GLOVE BIOGEL PI INDICATOR 8 (GLOVE) ×2
GLOVE ECLIPSE 8.0 STRL XLNG CF (GLOVE) ×3 IMPLANT
GLOVE ORTHO TXT STRL SZ7.5 (GLOVE) ×6 IMPLANT
GOWN SPEC L3 XXLG W/TWL (GOWN DISPOSABLE) ×6 IMPLANT
GOWN STRL REUS W/ TWL XL LVL3 (GOWN DISPOSABLE) ×1 IMPLANT
GOWN STRL REUS W/TWL LRG LVL3 (GOWN DISPOSABLE) ×3 IMPLANT
GOWN STRL REUS W/TWL XL LVL3 (GOWN DISPOSABLE) ×2
HANDPIECE INTERPULSE COAX TIP (DISPOSABLE) ×2
KIT BASIN OR (CUSTOM PROCEDURE TRAY) ×3 IMPLANT
MANIFOLD NEPTUNE II (INSTRUMENTS) ×3 IMPLANT
NDL SAFETY ECLIPSE 18X1.5 (NEEDLE) ×1 IMPLANT
NEEDLE HYPO 18GX1.5 SHARP (NEEDLE) ×2
NS IRRIG 1000ML POUR BTL (IV SOLUTION) ×3 IMPLANT
PACK TOTAL JOINT (CUSTOM PROCEDURE TRAY) ×3 IMPLANT
POSITIONER SURGICAL ARM (MISCELLANEOUS) ×3 IMPLANT
SET HNDPC FAN SPRY TIP SCT (DISPOSABLE) ×1 IMPLANT
SET PAD KNEE POSITIONER (MISCELLANEOUS) ×3 IMPLANT
SPONGE GAUZE 2X2 STER 10/PKG (GAUZE/BANDAGES/DRESSINGS)
SUCTION FRAZIER 12FR DISP (SUCTIONS) ×3 IMPLANT
SUT MNCRL AB 4-0 PS2 18 (SUTURE) ×3 IMPLANT
SUT VIC AB 1 CT1 36 (SUTURE) ×3 IMPLANT
SUT VIC AB 2-0 CT1 27 (SUTURE) ×6
SUT VIC AB 2-0 CT1 TAPERPNT 27 (SUTURE) ×3 IMPLANT
SUT VLOC 180 0 24IN GS25 (SUTURE) ×3 IMPLANT
SYR 50ML LL SCALE MARK (SYRINGE) ×3 IMPLANT
TOWEL OR 17X26 10 PK STRL BLUE (TOWEL DISPOSABLE) ×3 IMPLANT
TOWEL OR NON WOVEN STRL DISP B (DISPOSABLE) ×3 IMPLANT
TRAY FOLEY CATH 16FR SILVER (SET/KITS/TRAYS/PACK) ×3 IMPLANT
WATER STERILE IRR 1500ML POUR (IV SOLUTION) ×3 IMPLANT
WRAP KNEE MAXI GEL POST OP (GAUZE/BANDAGES/DRESSINGS) ×3 IMPLANT

## 2014-01-02 NOTE — Interval H&P Note (Signed)
History and Physical Interval Note:  01/02/2014 11:38 AM  Aaron Berg  has presented today for surgery, with the diagnosis of RIGHT KNEE OA  The various methods of treatment have been discussed with the patient and family. After consideration of risks, benefits and other options for treatment, the patient has consented to  Procedure(s): RIGHT TOTAL KNEE ARTHROPLASTY (Right) as a surgical intervention .  The patient's history has been reviewed, patient examined, no change in status, stable for surgery.  I have reviewed the patient's chart and labs.  Questions were answered to the patient's satisfaction.     Mauri Pole

## 2014-01-02 NOTE — Anesthesia Preprocedure Evaluation (Addendum)
Anesthesia Evaluation  Patient identified by MRN, date of birth, ID band Patient awake    Reviewed: Allergy & Precautions, H&P , NPO status , Patient's Chart, lab work & pertinent test results  History of Anesthesia Complications (+) Emergence Delirium  Airway Mallampati: II TM Distance: >3 FB Neck ROM: Full    Dental no notable dental hx.    Pulmonary COPDformer smoker,  breath sounds clear to auscultation  Pulmonary exam normal       Cardiovascular hypertension, Pt. on medications and Pt. on home beta blockers + CAD, + CABG (2003) and +CHF + dysrhythmias Atrial Fibrillation Rhythm:Regular Rate:Normal     Neuro/Psych negative neurological ROS  negative psych ROS   GI/Hepatic negative GI ROS, (+)     substance abuse  alcohol use,   Endo/Other  negative endocrine ROS  Renal/GU negative Renal ROS  negative genitourinary   Musculoskeletal negative musculoskeletal ROS (+)   Abdominal   Peds negative pediatric ROS (+)  Hematology negative hematology ROS (+)   Anesthesia Other Findings   Reproductive/Obstetrics negative OB ROS                          Anesthesia Physical Anesthesia Plan  ASA: III  Anesthesia Plan: Spinal   Post-op Pain Management:    Induction:   Airway Management Planned:   Additional Equipment:   Intra-op Plan:   Post-operative Plan:   Informed Consent: I have reviewed the patients History and Physical, chart, labs and discussed the procedure including the risks, benefits and alternatives for the proposed anesthesia with the patient or authorized representative who has indicated his/her understanding and acceptance.   Dental advisory given  Plan Discussed with: CRNA  Anesthesia Plan Comments:         Anesthesia Quick Evaluation

## 2014-01-02 NOTE — Progress Notes (Signed)
Pt arrived to floor from PACU. A&Ox4, VSS, pedal pulses +2 bilaterally. States zero pain. Will continue to monitor.

## 2014-01-02 NOTE — Progress Notes (Signed)
pts home machine. Places elf on/off.

## 2014-01-02 NOTE — Anesthesia Postprocedure Evaluation (Signed)
  Anesthesia Post-op Note  Patient: Aaron Berg  Procedure(s) Performed: Procedure(s) (LRB): RIGHT TOTAL KNEE ARTHROPLASTY (Right)  Patient Location: PACU  Anesthesia Type: Spinal  Level of Consciousness: awake and alert   Airway and Oxygen Therapy: Patient Spontanous Breathing  Post-op Pain: mild  Post-op Assessment: Post-op Vital signs reviewed, Patient's Cardiovascular Status Stable, Respiratory Function Stable, Patent Airway and No signs of Nausea or vomiting  Last Vitals:  Filed Vitals:   01/02/14 1714  BP: 125/77  Pulse: 68  Temp: 36.7 C  Resp: 16    Post-op Vital Signs: stable   Complications: No apparent anesthesia complications

## 2014-01-02 NOTE — Op Note (Signed)
NAME:  Aaron Berg                      MEDICAL RECORD NO.:  [id number]                             FACILITY:  [facility]      PHYSICIAN:  Pietro Cassis. Alvan Dame, M.D.  DATE OF BIRTH:  June 12, 1936      DATE OF PROCEDURE:  01/02/2014                                     OPERATIVE REPORT         PREOPERATIVE DIAGNOSIS:  Right knee osteoarthritis.      POSTOPERATIVE DIAGNOSIS:  Right knee osteoarthritis.      FINDINGS:  The patient was noted to have complete loss of cartilage and   bone-on-bone arthritis with associated osteophytes in all 3 compartments of   the knee predominantly laterally and patellofemoral with a significant synovitis and associated effusion.      PROCEDURE:  Right total knee replacement.      COMPONENTS USED:  DePuy rotating platform posterior stabilized knee   system, a size 5 femur, 5 tibia, 10 mm PS insert, and 41 patellar   button.      SURGEON:  Pietro Cassis. Alvan Dame, M.D.      ASSISTANT:  Danae Orleans, PA-C.      ANESTHESIA:  Spinal.      SPECIMENS:  None.      COMPLICATION:  None.      DRAINS:  None.  EBL: <150cc      TOURNIQUET TIME:   Total Tourniquet Time Documented: Thigh (Right) - 41 minutes Total: Thigh (Right) - 41 minutes  .      The patient was stable to the recovery room.      INDICATION FOR PROCEDURE:  Aaron Berg is a 78 y.o. male patient of   mine.  The patient had been seen, evaluated, and treated conservatively in the   office with medication, activity modification, and injections.  The patient had   radiographic changes of bone-on-bone arthritis with endplate sclerosis and osteophytes noted.      The patient failed conservative measures including medication, injections, and activity modification, and at this point was ready for more definitive measures.   Based on the radiographic changes and failed conservative measures, the patient   decided to proceed with total knee replacement.  Risks of infection,   DVT, component failure,  need for revision surgery, postop course, and   expectations were all   discussed and reviewed.  Consent was obtained for benefit of pain   relief.      PROCEDURE IN DETAIL:  The patient was brought to the operative theater.   Once adequate anesthesia, preoperative antibiotics, 2 gm of Ancef administered, the patient was positioned supine with the right thigh tourniquet placed.  The  right lower extremity was prepped and draped in sterile fashion.  A time-   out was performed identifying the patient, planned procedure, and   extremity.      The right lower extremity was placed in the [facility] leg holder.  The leg was   exsanguinated, tourniquet elevated to 250 mmHg.  A midline incision was   made followed by median parapatellar arthrotomy.  Following initial   exposure, attention  was first directed to the patella.  Precut   measurement was noted to be 26 mm.  I resected down to 14-15 mm and used a   41 patellar button to restore patellar height as well as cover the cut   surface.      The lug holes were drilled and a metal shim was placed to protect the   patella from retractors and saw blades.      At this point, attention was now directed to the femur.  The femoral   canal was opened with a drill, irrigated to try to prevent fat emboli.  An   intramedullary rod was passed at 5 degrees valgus, 10 mm of bone was   resected off the distal femur.  Following this resection, the tibia was   subluxated anteriorly.  Using the extramedullary guide, 10 mm of bone was resected off   the proximal lateral tibia.  We confirmed the gap would be   stable medially and laterally with a 10 mm insert as well as confirmed   the cut was perpendicular in the coronal plane, checking with an alignment rod.      Once this was done, I sized the femur to be a size 5 in the anterior-   posterior dimension, chose a standard component based on medial and   lateral dimension.  The size 5 rotation block was then  pinned in   position anterior referenced using the C-clamp to set rotation.  The   anterior, posterior, and  chamfer cuts were made without difficulty nor   notching making certain that I was along the anterior cortex to help   with flexion gap stability.      The final box cut was made off the lateral aspect of distal femur.      At this point, the tibia was sized to be a size 5, the size 5 tray was   then pinned in position through the medial third of the tubercle,   drilled, and keel punched.  Trial reduction was now carried with a 5 femur,  5 tibia, a 10 mm insert, and the 41 patella botton.  The knee was brought to   extension, full extension with good flexion stability with the patella   tracking through the trochlea without application of pressure.  Given   all these findings, the trial components removed.  Final components were   opened and cement was mixed.  The knee was irrigated with normal saline   solution and pulse lavage.  The synovial lining was   then injected with 20cc of Exparel, 30cc 0.25% Marcaine with epinephrine and 1 cc of Toradol,   total of 61 cc.      The knee was irrigated.  Final implants were then cemented onto clean and   dried cut surfaces of bone with the knee brought to extension with a 10 mm trial insert.      Once the cement had fully cured, the excess cement was removed   throughout the knee.  I confirmed I was satisfied with the range of   motion and stability, and the final 10 mm PS insert was chosen.  It was   placed into the knee.      The tourniquet had been let down at 41 minutes.  No significant   hemostasis required.  The extensor mechanism was then reapproximated using #1 Vicryl with the knee   in flexion.  The   remaining wound was closed  with 2-0 Vicryl and running 4-0 Monocryl.   The knee was cleaned, dried, dressed sterilely using Dermabond and   Aquacel dressing.  The patient was then   brought to recovery room in stable condition,  tolerating the procedure   well.   Please note that Physician Assistant, Danae Orleans, PA-C, was present for the entirety of the case, and was utilized for pre-operative positioning, peri-operative retractor management, general facilitation of the procedure.  He was also utilized for primary wound closure at the end of the case.              Pietro Cassis Alvan Dame, M.D.    01/02/2014 2:35 PM

## 2014-01-02 NOTE — Anesthesia Procedure Notes (Signed)
Spinal  Patient location during procedure: OR Staffing Anesthesiologist: ,  Performed by: anesthesiologist  Preanesthetic Checklist Completed: patient identified, site marked, surgical consent, pre-op evaluation, timeout performed, IV checked, risks and benefits discussed and monitors and equipment checked Spinal Block Patient position: sitting Prep: Betadine Patient monitoring: heart rate, continuous pulse ox and blood pressure Approach: right paramedian Location: L3-4 Injection technique: single-shot Needle Needle type: Spinocan  Needle gauge: 22 G Needle length: 9 cm Additional Notes Expiration date of kit checked and confirmed. Patient tolerated procedure well, without complications.     

## 2014-01-02 NOTE — Transfer of Care (Signed)
Immediate Anesthesia Transfer of Care Note  Patient: Aaron Berg  Procedure(s) Performed: Procedure(s) (LRB): RIGHT TOTAL KNEE ARTHROPLASTY (Right)  Patient Location: PACU  Anesthesia Type: Spinal  Level of Consciousness: sedated, patient cooperative and responds to stimulation  Airway & Oxygen Therapy: Patient Spontanous Breathing and Patient connected to face mask oxgen  Post-op Assessment: Report given to PACU RN and Post -op Vital signs reviewed and stable  Post vital signs: Reviewed and stable  Complications: No apparent anesthesia complications

## 2014-01-02 NOTE — Progress Notes (Signed)
UR completed 

## 2014-01-02 NOTE — Progress Notes (Signed)
Pts home cpap set up and ready for pt use.  Pt places self on/off machine.

## 2014-01-03 ENCOUNTER — Encounter (HOSPITAL_COMMUNITY): Payer: Self-pay | Admitting: Orthopedic Surgery

## 2014-01-03 LAB — CBC
HEMATOCRIT: 31.9 % — AB (ref 39.0–52.0)
Hemoglobin: 10.7 g/dL — ABNORMAL LOW (ref 13.0–17.0)
MCH: 32.6 pg (ref 26.0–34.0)
MCHC: 33.5 g/dL (ref 30.0–36.0)
MCV: 97.3 fL (ref 78.0–100.0)
PLATELETS: 97 10*3/uL — AB (ref 150–400)
RBC: 3.28 MIL/uL — ABNORMAL LOW (ref 4.22–5.81)
RDW: 14.3 % (ref 11.5–15.5)
WBC: 7.4 10*3/uL (ref 4.0–10.5)

## 2014-01-03 LAB — BASIC METABOLIC PANEL
BUN: 24 mg/dL — ABNORMAL HIGH (ref 6–23)
CO2: 27 mEq/L (ref 19–32)
CREATININE: 1.26 mg/dL (ref 0.50–1.35)
Calcium: 9 mg/dL (ref 8.4–10.5)
Chloride: 103 mEq/L (ref 96–112)
GFR calc Af Amer: 63 mL/min — ABNORMAL LOW (ref >90)
GFR calc non Af Amer: 54 mL/min — ABNORMAL LOW (ref >90)
Glucose, Bld: 121 mg/dL — ABNORMAL HIGH (ref 70–99)
Potassium: 4.3 mEq/L (ref 3.7–5.3)
Sodium: 139 mEq/L (ref 137–147)

## 2014-01-03 NOTE — Evaluation (Signed)
Physical Therapy Evaluation Patient Details Name: Aaron Berg MRN: [id number] DOB: 02/11/1936 Today's Date: 01/03/2014 Time: 8453-6468 PT Time Calculation (min): 19 min  PT Assessment / Plan / Recommendation History of Present Illness  s/p R TKR  Clinical Impression  Pt is s/p R TKA resulting in the deficits listed below (see PT Problem List).  Pt will benefit from skilled PT to increase their independence and safety with mobility to allow discharge to the venue listed below.  Pt plans to d/c to rehab as he lives in trailer which he states is not conducive for RW.     PT Assessment  Patient needs continued PT services    Follow Up Recommendations  SNF    Does the patient have the potential to tolerate intense rehabilitation      Barriers to Discharge        Equipment Recommendations  Rolling walker with 5" wheels    Recommendations for Other Services     Frequency 7X/week    Precautions / Restrictions Precautions Precautions: Knee Restrictions Other Position/Activity Restrictions: WBAT   Pertinent Vitals/Pain Increased R knee pain with ambulation, so recliner brought behind pt to sit down, repositioned, pt reports just premedicated, ice packs applied      Mobility  Bed Mobility Overal bed mobility: Needs Assistance Bed Mobility: Supine to Sit Supine to sit: Min assist;HOB elevated General bed mobility comments: verbal cues for technique, assist for R LE Transfers Overall transfer level: Needs assistance Equipment used: Rolling walker (2 wheeled) Transfers: Sit to/from Stand Sit to Stand: Min assist General transfer comment: verbal cues for safe technique, assist to rise and steady, reported dizziness however did not become worse, also states hx of BPPV Ambulation/Gait Ambulation/Gait assistance: Min assist Ambulation Distance (Feet): 15 Feet Assistive device: Rolling walker (2 wheeled) Gait Pattern/deviations: Step-to pattern;Trunk flexed Gait velocity:  decr General Gait Details: verbal cues for sequence, RW distance, posture, distance limited by pain    Exercises     PT Diagnosis: Difficulty walking;Acute pain  PT Problem List: Decreased strength;Decreased range of motion;Decreased mobility;Pain;Decreased knowledge of use of DME PT Treatment Interventions: Functional mobility training;Gait training;DME instruction;Patient/family education;Therapeutic activities;Therapeutic exercise     PT Goals(Current goals can be found in the care plan section) Acute Rehab PT Goals PT Goal Formulation: With patient Time For Goal Achievement: 01/10/14 Potential to Achieve Goals: Good  Visit Information  Last PT Received On: 01/03/14 Assistance Needed: +1 History of Present Illness: s/p R TKR       Prior Functioning  Home Living Family/patient expects to be discharged to:: Skilled nursing facility Prior Function Level of Independence: Independent Communication Communication: No difficulties    Cognition  Cognition Arousal/Alertness: Awake/alert Behavior During Therapy: WFL for tasks assessed/performed Overall Cognitive Status: Within Functional Limits for tasks assessed    Extremity/Trunk Assessment Lower Extremity Assessment Lower Extremity Assessment: RLE deficits/detail RLE Deficits / Details: unable to perform SLR, observed AROM with mobility -5-45*   Balance    End of Session PT - End of Session Activity Tolerance: Patient limited by pain Patient left: in chair;with call bell/phone within reach  GP     Terrianne Cavness,KATHrine E 01/03/2014, 1:00 PM Carmelia Bake, PT, DPT 01/03/2014 Pager: [phone]

## 2014-01-03 NOTE — Progress Notes (Signed)
Pt has home CPAP from home and will self administer when ready.  RT to monitor and assess as needed.

## 2014-01-03 NOTE — Progress Notes (Signed)
   Subjective: 1 Day Post-Op Procedure(s) (LRB): RIGHT TOTAL KNEE ARTHROPLASTY (Right)   Patient reports pain as mild, pain controlled. No events throughout the night.   Objective:   VITALS:   Filed Vitals:   01/03/14  BP: 127/83  Pulse: 97  Temp: 99.3 F (37.4 C)   Resp: 18    Neurovascular intact Dorsiflexion/Plantar flexion intact Incision: dressing C/D/I No cellulitis present Compartment soft  LABS  Recent Labs  01/03/14 0458  HGB 10.7*  HCT 31.9*  WBC 7.4  PLT 97*     Recent Labs  01/03/14 0458  NA 139  K 4.3  BUN 24*  CREATININE 1.26  GLUCOSE 121*     Assessment/Plan: 1 Day Post-Op Procedure(s) (LRB): RIGHT TOTAL KNEE ARTHROPLASTY (Right) Foley cath d/c'ed Advance diet Up with therapy D/C IV fluids Discharge to SNF eventually, when ready  Expected ABLA  Treated with iron and will observe  Morbid Obesity (BMI >40)  Estimated body mass index is 41 kg/(m^2) as calculated from the following:   Height as of this encounter: 5\' 9"  (1.753 m).   Weight as of this encounter: 126 kg (277 lb 12.5 oz). Patient also counseled that weight may inhibit the healing process Patient counseled that losing weight will help with future health issues      [place] Pugh. Kip Kautzman   PAC  01/03/2014, 8:33 AM

## 2014-01-03 NOTE — Progress Notes (Signed)
Clinical Social Work Department CLINICAL SOCIAL WORK PLACEMENT NOTE 01/03/2014  Patient:  Aaron Berg, Aaron Berg  Account Number:  [id number] Admit date:  01/02/2014  Clinical Social Worker:  Werner Lean, LCSW  Date/time:  01/03/2014 03:00 PM  Clinical Social Work is seeking post-discharge placement for this patient at the following level of care:   SKILLED NURSING   (*CSW will update this form in Epic as items are completed)     Patient/family provided with [place] Department of Clinical Social Work's list of facilities offering this level of care within the geographic area requested by the patient (or if unable, by the patient's family).  01/03/2014  Patient/family informed of their freedom to choose among providers that offer the needed level of care, that participate in Medicare, Medicaid or managed care program needed by the patient, have an available bed and are willing to accept the patient.    Patient/family informed of [facility]' ownership interest in [facility], as well as of the fact that they are under no obligation to receive care at this facility.  PASARR submitted to EDS on 01/03/2014 PASARR number received from EDS on 01/03/2014  FL2 transmitted to all facilities in geographic area requested by pt/family on  01/03/2014 FL2 transmitted to all facilities within larger geographic area on   Patient informed that his/her managed care company has contracts with or will negotiate with  certain facilities, including the following:     Patient/family informed of bed offers received:  01/03/2014 Patient chooses bed at [place] Physician recommends and patient chooses bed at    Patient to be transferred to [place] on   Patient to be transferred to facility by   The following physician request were entered in Epic:   Additional Comments:  Werner Lean LCSW [phone]

## 2014-01-03 NOTE — Progress Notes (Signed)
Clinical Social Work Department BRIEF PSYCHOSOCIAL ASSESSMENT 01/03/2014  Patient:  Aaron Berg, Aaron Berg     Account Number:  [id number]     Admit date:  01/02/2014  Clinical Social Worker:  Lacie Scotts  Date/Time:  01/03/2014 02:51 PM  Referred by:  Physician  Date Referred:  01/03/2014 Referred for  SNF Placement   Other Referral:   Interview type:  Patient Other interview type:    PSYCHOSOCIAL DATA Living Status:  ALONE Admitted from facility:   Level of care:   Primary support name:  [name] Primary support relationship to patient:  CHILD, ADULT Degree of support available:   supportive    CURRENT CONCERNS Current Concerns  Post-Acute Placement   Other Concerns:    SOCIAL WORK ASSESSMENT / PLAN Pt is a 78 yr old gentleman living at home prior to hospitalization. CSW met with pt to assist with d/c planning. Pt has made prior arrangements to have [facility] Rehab at [facility] following hospital d/c. CSW has contacted SNF and d/c plan has been confirmed. CSW will continue to follow to assist with d/c planning needs.   Assessment/plan status:  Psychosocial Support/Ongoing Assessment of Needs Other assessment/ plan:   Information/referral to community resources:   Insurance coverage for SNF and ambulance transport reviewed.    PATIENT'S/FAMILY'S RESPONSE TO PLAN OF CARE: Pt is looking forward to having rehab at [facility].    Aaron Lean LCSW [phone]

## 2014-01-03 NOTE — Progress Notes (Signed)
Physical Therapy Treatment Note   01/03/14 1400  PT Visit Information  Last PT Received On 01/03/14  Assistance Needed +1  History of Present Illness s/p R TKR  PT Time Calculation  PT Start Time 1338  PT Stop Time 1406  PT Time Calculation (min) 28 min  Subjective Data  Subjective Pt reports pain better this afternoon.  Pt ambulated in hallway then assisted back to bed and performed exercises.  Precautions  Precautions Knee  Restrictions  Other Position/Activity Restrictions WBAT  Cognition  Arousal/Alertness Awake/alert  Behavior During Therapy WFL for tasks assessed/performed  Overall Cognitive Status Within Functional Limits for tasks assessed  Bed Mobility  Overal bed mobility Needs Assistance  Bed Mobility Sit to Supine  Sit to supine Min assist  General bed mobility comments verbal cues for technique, assist for R LE  Transfers  Overall transfer level Needs assistance  Equipment used Rolling walker (2 wheeled)  Transfers Sit to/from Stand  Sit to Stand Min assist  General transfer comment verbal cues for safe technique, assist to rise and steady  Ambulation/Gait  Ambulation/Gait assistance Min assist  Ambulation Distance (Feet) 30 Feet  Assistive device Rolling walker (2 wheeled)  Gait Pattern/deviations Step-to pattern;Antalgic;Trunk flexed  Gait velocity decr  General Gait Details verbal cues for sequence, RW distance, posture, able to tolerate same distance however also able to ambulate back to room this afternoon  Exercises  Exercises Total Joint  Total Joint Exercises  Ankle Circles/Pumps AROM;Both;15 reps  Quad Sets AROM;Both;15 reps  Short Arc Doretha Imus;Right;10 reps  Heel Slides AAROM;Right;15 reps;Supine  Hip ABduction/ADduction 15 reps;AAROM;Right  Straight Leg Raises AAROM;Right;10 reps  PT - End of Session  Activity Tolerance Patient limited by pain  Patient left in bed;with call bell/phone within reach;with family/visitor present  PT -  Assessment/Plan  PT Plan Current plan remains appropriate  PT Frequency 7X/week  Follow Up Recommendations SNF  PT equipment Rolling walker with 5" wheels  Acute Rehab PT Goals  PT Goal Formulation With patient  Time For Goal Achievement 01/10/14  Potential to Achieve Goals Good  PT General Charges  $$ ACUTE PT VISIT 1 Procedure  PT Treatments  $Gait Training 8-22 mins  $Therapeutic Exercise 8-22 mins   Carmelia Bake, PT, DPT 01/03/2014 Pager: [phone]

## 2014-01-03 NOTE — Progress Notes (Signed)
OT Cancellation Note  Patient Details Name: Aaron Berg MRN: [id number] DOB: 1936/02/16   Cancelled Treatment:    Reason Eval/Treat Not Completed: Other (comment) Pt just up with PT in hallway but with pain. Will try back later today or in am.  Jules Schick [phone] 01/03/2014, 11:11 AM

## 2014-01-04 DIAGNOSIS — E669 Obesity, unspecified: Secondary | ICD-10-CM | POA: Diagnosis present

## 2014-01-04 DIAGNOSIS — D62 Acute posthemorrhagic anemia: Secondary | ICD-10-CM | POA: Diagnosis not present

## 2014-01-04 LAB — CBC
HEMATOCRIT: 29.2 % — AB (ref 39.0–52.0)
Hemoglobin: 10.2 g/dL — ABNORMAL LOW (ref 13.0–17.0)
MCH: 33.4 pg (ref 26.0–34.0)
MCHC: 34.9 g/dL (ref 30.0–36.0)
MCV: 95.7 fL (ref 78.0–100.0)
Platelets: 106 10*3/uL — ABNORMAL LOW (ref 150–400)
RBC: 3.05 MIL/uL — AB (ref 4.22–5.81)
RDW: 14.2 % (ref 11.5–15.5)
WBC: 10.9 10*3/uL — AB (ref 4.0–10.5)

## 2014-01-04 LAB — BASIC METABOLIC PANEL
BUN: 24 mg/dL — AB (ref 6–23)
CO2: 26 meq/L (ref 19–32)
CREATININE: 1.32 mg/dL (ref 0.50–1.35)
Calcium: 9.4 mg/dL (ref 8.4–10.5)
Chloride: 99 mEq/L (ref 96–112)
GFR calc Af Amer: 59 mL/min — ABNORMAL LOW (ref >90)
GFR calc non Af Amer: 51 mL/min — ABNORMAL LOW (ref >90)
Glucose, Bld: 151 mg/dL — ABNORMAL HIGH (ref 70–99)
Potassium: 4 mEq/L (ref 3.7–5.3)
Sodium: 138 mEq/L (ref 137–147)

## 2014-01-04 NOTE — Progress Notes (Signed)
   Subjective: 2 Days Post-Op Procedure(s) (LRB): RIGHT TOTAL KNEE ARTHROPLASTY (Right)   Patient reports pain as mild, pain controlled. No events throughout the night.   Objective:   VITALS:   Filed Vitals:   01/04/14  BP: 135/83  Pulse: 68  Temp: 97.8 F (36.6 C)   Resp: 18    Neurovascular intact Dorsiflexion/Plantar flexion intact Incision: dressing C/D/I No cellulitis present Compartment soft  LABS  Recent Labs  01/03/14 0458 01/04/14 0455  HGB 10.7* 10.2*  HCT 31.9* 29.2*  WBC 7.4 10.9*  PLT 97* 106*     Recent Labs  01/03/14 0458 01/04/14 0455  NA 139 138  K 4.3 4.0  BUN 24* 24*  CREATININE 1.26 1.32  GLUCOSE 121* 151*     Assessment/Plan: 2 Days Post-Op Procedure(s) (LRB): RIGHT TOTAL KNEE ARTHROPLASTY (Right) ACE bandage removed Up with therapy Discharge to SNF eventually, when ready  Expected ABLA  Treated with iron and will observe   Morbid Obesity (BMI >40)  Estimated body mass index is 41 kg/(m^2) as calculated from the following:      Height as of this encounter: 5\' 9"  (1.753 m).      Weight as of this encounter: 126 kg (277 lb 12.5 oz).  Patient also counseled that weight may inhibit the healing process  Patient counseled that losing weight will help with future health issues        Aaron Berg. Aaron Berg   PAC  01/04/2014, 9:44 AM

## 2014-01-04 NOTE — Progress Notes (Signed)
Physical Therapy Treatment Patient Details Name: Aaron Berg MRN: [id number] DOB: January 16, 1936 Today's Date: 01/04/2014 Time: 4098-1191 PT Time Calculation (min): 29 min  PT Assessment / Plan / Recommendation  History of Present Illness s/p R TKR   PT Comments     Follow Up Recommendations  SNF     Does the patient have the potential to tolerate intense rehabilitation     Barriers to Discharge        Equipment Recommendations  Rolling walker with 5" wheels    Recommendations for Other Services    Frequency 7X/week   Progress towards PT Goals Progress towards PT goals: Progressing toward goals  Plan Current plan remains appropriate    Precautions / Restrictions Precautions Precautions: Knee Restrictions Other Position/Activity Restrictions: WBAT   Pertinent Vitals/Pain Premedicated, repositioned, ice packs applied    Mobility  Bed Mobility Overal bed mobility: Needs Assistance Bed Mobility: Supine to Sit Supine to sit: Min assist;HOB elevated General bed mobility comments: verbal cues for technique, assist for R LE Transfers Overall transfer level: Needs assistance Equipment used: Rolling walker (2 wheeled) Transfers: Sit to/from Stand Sit to Stand: Min guard General transfer comment: verbal cues for safe technique Ambulation/Gait Ambulation/Gait assistance: Min guard Ambulation Distance (Feet): 80 Feet Assistive device: Rolling walker (2 wheeled) Gait Pattern/deviations: Step-to pattern;Antalgic Gait velocity: decr General Gait Details: verbal cues for sequence, RW distance, step length, better posture today    Exercises Total Joint Exercises Ankle Circles/Pumps: AROM;Both;15 reps Quad Sets: AROM;Both;15 reps Short Arc [place];Right;10 reps Heel Slides: Right;15 reps;Seated;AROM Hip ABduction/ADduction: 15 reps;Right;AROM Straight Leg Raises: AAROM;Right;10 reps Goniometric ROM: knee flexion seated edge of chair 100* active   PT Diagnosis:    PT  Problem List:   PT Treatment Interventions:     PT Goals (current goals can now be found in the care plan section)    Visit Information  Last PT Received On: 01/04/14 Assistance Needed: +1 History of Present Illness: s/p R TKR    Subjective Data  Subjective: Pt ambulated in hallway and performed LE exercises.  Pt plans to d/c to SNF tomorrow.   Cognition  Cognition Arousal/Alertness: Awake/alert Behavior During Therapy: WFL for tasks assessed/performed Overall Cognitive Status: Within Functional Limits for tasks assessed    Balance     End of Session PT - End of Session Activity Tolerance: Patient tolerated treatment well Patient left: in chair;with call bell/phone within reach   GP     Perrin Eddleman,KATHrine E 01/04/2014, 12:54 PM Carmelia Bake, PT, DPT 01/04/2014 Pager: [phone]

## 2014-01-04 NOTE — Progress Notes (Signed)
OT Cancellation Note  Patient Details Name: MARK HASSEY MRN: [id number] DOB: 12/07/1936   Cancelled Treatment:    Reason Eval/Treat Not Completed: Other (comment) Note plan for SNF. Will defer OT to SNF.  Jules Schick [phone] 01/04/2014, 11:36 AM

## 2014-01-05 ENCOUNTER — Other Ambulatory Visit: Payer: Self-pay | Admitting: *Deleted

## 2014-01-05 DIAGNOSIS — G8918 Other acute postprocedural pain: Secondary | ICD-10-CM | POA: Diagnosis not present

## 2014-01-05 DIAGNOSIS — M6580 Other synovitis and tenosynovitis, unspecified site: Secondary | ICD-10-CM | POA: Diagnosis present

## 2014-01-05 DIAGNOSIS — F3289 Other specified depressive episodes: Secondary | ICD-10-CM | POA: Diagnosis not present

## 2014-01-05 DIAGNOSIS — M6281 Muscle weakness (generalized): Secondary | ICD-10-CM | POA: Diagnosis not present

## 2014-01-05 DIAGNOSIS — IMO0002 Reserved for concepts with insufficient information to code with codable children: Secondary | ICD-10-CM | POA: Diagnosis not present

## 2014-01-05 DIAGNOSIS — S99929A Unspecified injury of unspecified foot, initial encounter: Secondary | ICD-10-CM | POA: Diagnosis not present

## 2014-01-05 DIAGNOSIS — J449 Chronic obstructive pulmonary disease, unspecified: Secondary | ICD-10-CM | POA: Diagnosis not present

## 2014-01-05 DIAGNOSIS — R269 Unspecified abnormalities of gait and mobility: Secondary | ICD-10-CM | POA: Diagnosis not present

## 2014-01-05 DIAGNOSIS — F331 Major depressive disorder, recurrent, moderate: Secondary | ICD-10-CM | POA: Diagnosis not present

## 2014-01-05 DIAGNOSIS — Z6841 Body Mass Index (BMI) 40.0 and over, adult: Secondary | ICD-10-CM | POA: Diagnosis not present

## 2014-01-05 DIAGNOSIS — Z01812 Encounter for preprocedural laboratory examination: Secondary | ICD-10-CM | POA: Diagnosis not present

## 2014-01-05 DIAGNOSIS — M25569 Pain in unspecified knee: Secondary | ICD-10-CM | POA: Diagnosis not present

## 2014-01-05 DIAGNOSIS — K5900 Constipation, unspecified: Secondary | ICD-10-CM | POA: Diagnosis not present

## 2014-01-05 DIAGNOSIS — R413 Other amnesia: Secondary | ICD-10-CM | POA: Diagnosis present

## 2014-01-05 DIAGNOSIS — I10 Essential (primary) hypertension: Secondary | ICD-10-CM | POA: Diagnosis not present

## 2014-01-05 DIAGNOSIS — D649 Anemia, unspecified: Secondary | ICD-10-CM | POA: Diagnosis not present

## 2014-01-05 DIAGNOSIS — E785 Hyperlipidemia, unspecified: Secondary | ICD-10-CM | POA: Diagnosis not present

## 2014-01-05 DIAGNOSIS — M62838 Other muscle spasm: Secondary | ICD-10-CM | POA: Diagnosis not present

## 2014-01-05 DIAGNOSIS — Z951 Presence of aortocoronary bypass graft: Secondary | ICD-10-CM | POA: Diagnosis not present

## 2014-01-05 DIAGNOSIS — J4489 Other specified chronic obstructive pulmonary disease: Secondary | ICD-10-CM | POA: Diagnosis not present

## 2014-01-05 DIAGNOSIS — Z96619 Presence of unspecified artificial shoulder joint: Secondary | ICD-10-CM | POA: Diagnosis not present

## 2014-01-05 DIAGNOSIS — I4891 Unspecified atrial fibrillation: Secondary | ICD-10-CM | POA: Diagnosis not present

## 2014-01-05 DIAGNOSIS — Z9861 Coronary angioplasty status: Secondary | ICD-10-CM | POA: Diagnosis not present

## 2014-01-05 DIAGNOSIS — M1990 Unspecified osteoarthritis, unspecified site: Secondary | ICD-10-CM | POA: Diagnosis not present

## 2014-01-05 DIAGNOSIS — M1710 Unilateral primary osteoarthritis, unspecified knee: Secondary | ICD-10-CM | POA: Diagnosis not present

## 2014-01-05 DIAGNOSIS — G4730 Sleep apnea, unspecified: Secondary | ICD-10-CM | POA: Diagnosis present

## 2014-01-05 DIAGNOSIS — F329 Major depressive disorder, single episode, unspecified: Secondary | ICD-10-CM | POA: Diagnosis not present

## 2014-01-05 DIAGNOSIS — Z87891 Personal history of nicotine dependence: Secondary | ICD-10-CM | POA: Diagnosis not present

## 2014-01-05 DIAGNOSIS — I5042 Chronic combined systolic (congestive) and diastolic (congestive) heart failure: Secondary | ICD-10-CM | POA: Diagnosis not present

## 2014-01-05 DIAGNOSIS — E43 Unspecified severe protein-calorie malnutrition: Secondary | ICD-10-CM | POA: Diagnosis not present

## 2014-01-05 DIAGNOSIS — I509 Heart failure, unspecified: Secondary | ICD-10-CM | POA: Diagnosis not present

## 2014-01-05 DIAGNOSIS — Z8249 Family history of ischemic heart disease and other diseases of the circulatory system: Secondary | ICD-10-CM | POA: Diagnosis not present

## 2014-01-05 DIAGNOSIS — S8990XA Unspecified injury of unspecified lower leg, initial encounter: Secondary | ICD-10-CM | POA: Diagnosis not present

## 2014-01-05 DIAGNOSIS — Z471 Aftercare following joint replacement surgery: Secondary | ICD-10-CM | POA: Diagnosis not present

## 2014-01-05 DIAGNOSIS — E291 Testicular hypofunction: Secondary | ICD-10-CM | POA: Diagnosis present

## 2014-01-05 DIAGNOSIS — Z96659 Presence of unspecified artificial knee joint: Secondary | ICD-10-CM | POA: Diagnosis not present

## 2014-01-05 DIAGNOSIS — I2510 Atherosclerotic heart disease of native coronary artery without angina pectoris: Secondary | ICD-10-CM | POA: Diagnosis not present

## 2014-01-05 DIAGNOSIS — D62 Acute posthemorrhagic anemia: Secondary | ICD-10-CM | POA: Diagnosis not present

## 2014-01-05 MED ORDER — HYDROCODONE-ACETAMINOPHEN 7.5-325 MG PO TABS: 1.0000 | ORAL_TABLET | ORAL | Status: DC | PRN

## 2014-01-05 MED ORDER — FERROUS SULFATE 325 (65 FE) MG PO TABS: 325.0000 mg | ORAL_TABLET | Freq: Three times a day (TID) | ORAL | Status: DC

## 2014-01-05 MED ORDER — POLYETHYLENE GLYCOL 3350 17 G PO PACK: 17.0000 g | PACK | Freq: Two times a day (BID) | ORAL | Status: DC

## 2014-01-05 MED ORDER — DSS 100 MG PO CAPS: 100.0000 mg | ORAL_CAPSULE | Freq: Two times a day (BID) | ORAL | Status: DC

## 2014-01-05 MED ORDER — TESTOSTERONE 20.25 MG/ACT (1.62%) TD GEL: 2.0000 [IU] | TRANSDERMAL | Status: DC

## 2014-01-05 MED ORDER — TIZANIDINE HCL 4 MG PO CAPS: 4.0000 mg | ORAL_CAPSULE | Freq: Three times a day (TID) | ORAL | Status: DC | PRN

## 2014-01-05 MED ORDER — SPIRITUS FRUMENTI: 1.0000 | Freq: Every evening | ORAL | Status: DC | PRN

## 2014-01-05 NOTE — Plan of Care (Signed)
Problem: Consults Goal: Diagnosis- Total Joint Replacement Outcome: Completed/Met Date Met:  01/05/14 Primary Total Knee RIGHT

## 2014-01-05 NOTE — Progress Notes (Signed)
Physical Therapy Treatment Patient Details Name: Aaron Berg MRN: [id number] DOB: 09/20/1936 Today's Date: 01/05/2014 Time: 0867-6195 PT Time Calculation (min): 24 min  PT Assessment / Plan / Recommendation  History of Present Illness s/p R TKR   PT Comments   *Excellent progress with mobility. [name] ambulated 400' with RW, R knee flexion 100* AAROM. REady to DC from PT standpoint.**  Follow Up Recommendations  SNF     Does the patient have the potential to tolerate intense rehabilitation     Barriers to Discharge        Equipment Recommendations  Rolling walker with 5" wheels    Recommendations for Other Services    Frequency 7X/week   Progress towards PT Goals Progress towards PT goals: Progressing toward goals  Plan Current plan remains appropriate    Precautions / Restrictions Precautions Precautions: Knee Restrictions Other Position/Activity Restrictions: WBAT   Pertinent Vitals/Pain **3/10 R knee Premedicated, ice applied*    Mobility  Transfers Overall transfer level: Needs assistance Equipment used: Rolling walker (2 wheeled) Transfers: Sit to/from Stand Sit to Stand: Supervision General transfer comment: verbal cues for safe technique Ambulation/Gait Ambulation/Gait assistance: Supervision Ambulation Distance (Feet): 400 Feet Assistive device: Rolling walker (2 wheeled) Gait Pattern/deviations: Step-through pattern Gait velocity: decr General Gait Details: good sequencing    Exercises Total Joint Exercises Ankle Circles/Pumps: AROM;Both;15 reps Quad Sets: AROM;Both;15 reps Short Arc QuadSinclair Ship;Right;10 reps Heel Slides: Right;15 reps;Seated;AROM Hip ABduction/ADduction: 15 reps;Right;AROM Straight Leg Raises: AAROM;Right;10 reps Long Arc Quad: AROM;Right;10 reps;Seated Knee Flexion: AROM;Right;10 reps;Seated   PT Diagnosis:    PT Problem List:   PT Treatment Interventions:     PT Goals (current goals can now be found in the care plan  section) Acute Rehab PT Goals PT Goal Formulation: With patient Time For Goal Achievement: 01/10/14 Potential to Achieve Goals: Good  Visit Information  Last PT Received On: 01/05/14 Assistance Needed: +1 History of Present Illness: s/p R TKR    Subjective Data      Cognition  Cognition Arousal/Alertness: Awake/alert Behavior During Therapy: WFL for tasks assessed/performed Overall Cognitive Status: Within Functional Limits for tasks assessed    Balance     End of Session PT - End of Session Activity Tolerance: Patient tolerated treatment well Patient left: in chair;with call bell/phone within reach   GP     Philomena Doheny 01/05/2014, 12:48 PM [phone]

## 2014-01-05 NOTE — Progress Notes (Signed)
   Subjective: 3 Days Post-Op Procedure(s) (LRB): RIGHT TOTAL KNEE ARTHROPLASTY (Right)   Patient reports pain as mild.  Objective:   VITALS:   Filed Vitals:   01/05/14 0630  BP: 121/75  Pulse: 77  Temp: 98.5 F (36.9 C)  Resp: 18    Neurovascular intact Dorsiflexion/Plantar flexion intact Incision: dressing C/D/I No cellulitis present Compartment soft  LABS  Recent Labs  01/03/14 0458 01/04/14 0455  HGB 10.7* 10.2*  HCT 31.9* 29.2*  WBC 7.4 10.9*  PLT 97* 106*     Recent Labs  01/03/14 0458 01/04/14 0455  NA 139 138  K 4.3 4.0  BUN 24* 24*  CREATININE 1.26 1.32  GLUCOSE 121* 151*     Assessment/Plan: 3 Days Post-Op Procedure(s) (LRB): RIGHT TOTAL KNEE ARTHROPLASTY (Right) Up with therapy Discharge to SNF Follow up in 2 weeks at [facility]. Follow up with OLIN,Ellinor Test D in 2 weeks.  Contact information:  [facility] [address] [place] [phone]    Expected ABLA  Treated with iron and will observe   Morbid Obesity (BMI >40)  Estimated body mass index is 41 kg/(m^2) as calculated from the following:      Height as of this encounter: 5\' 9"  (1.753 m).      Weight as of this encounter: 126 kg (277 lb 12.5 oz).  Patient also counseled that weight may inhibit the healing process  Patient counseled that losing weight will help with future health issues       West Pugh. Aaron Berg   PAC  01/05/2014, 7:44 AM

## 2014-01-05 NOTE — Discharge Summary (Signed)
Physician Discharge Summary  Patient ID: DELRAY REZA MRN: [id number] DOB/AGE: 76-Jul-1937 76 y.o.  Admit date: 01/02/2014 Discharge date:  01/05/2014  Procedures:  Procedure(s) (LRB): RIGHT TOTAL KNEE ARTHROPLASTY (Right)  Attending Physician:  Dr. Paralee Cancel   Admission Diagnoses:   Right knee OA / pain  Discharge Diagnoses:  Principal Problem:   S/P right TKA Active Problems:   Expected blood loss anemia   Morbid obesity  Past Medical History  Diagnosis Date  . ASHD (arteriosclerotic heart disease)   . Dyslipidemia     well controlled  . Obesity   . Arthritis   . Male hypogonadism   . Gait disorder   . Hypertension   . Dysrhythmia 2003    AFib after CABG; warfarin x 6 months  . Sleep apnea     cpap  . CHF (congestive heart failure)   . Dry eyes   . Complication of anesthesia     "woke up too soon"  . Depression   . COPD (chronic obstructive pulmonary disease)     "no signs or tests"  . Headache(784.0)     silent migraines- no pain just go blind for just a minute  . Cancer     skin    HPI: Aaron Berg, 78 y.o. male, has a history of pain and functional disability in the right knee due to arthritis and has failed non-surgical conservative treatments for greater than 12 weeks to include NSAID's and/or analgesics, corticosteriod injections, use of assistive devices and activity modification. Onset of symptoms was gradual, starting 7+ years ago with gradually worsening course since that time. The patient noted no past surgery on the right knee(s). Patient currently rates pain in the right knee(s) at 7 out of 10 with activity. Patient has worsening of pain with activity and weight bearing, pain that interferes with activities of daily living, pain with passive range of motion, crepitus and joint swelling. Patient has evidence of periarticular osteophytes and joint space narrowing by imaging studies. There is no active infection. Risks, benefits and expectations  were discussed with the patient. Risks including but not limited to the risk of anesthesia, blood clots, nerve damage, blood vessel damage, failure of the prosthesis, infection and up to and including death. Patient understand the risks, benefits and expectations and wishes to proceed with surgery.   PCP: Wyatt Haste, MD   Discharged Condition: good  Hospital Course:  Patient underwent the above stated procedure on 01/02/2014. Patient tolerated the procedure well and brought to the recovery room in good condition and subsequently to the floor.  POD #1 BP: 127/83 ; Pulse: 97 ; Temp: 99.3 F (37.4 C) ; Resp: 18  Patient reports pain as mild, pain controlled. No events throughout the night.  Neurovascular intact, dorsiflexion/plantar flexion intact, incision: dressing C/D/I, no cellulitis present and compartment soft.   LABS  Basename    HGB  10.7  HCT  31.9   POD #2  BP: 135/83 ; Pulse: 68 ; Temp: 97.8 F (36.6 C) ; Resp: 18  Patient reports pain as mild, pain controlled. No events throughout the night.  Working well with PT. Neurovascular intact, dorsiflexion/plantar flexion intact, incision: dressing C/D/I, no cellulitis present and compartment soft.   LABS  Basename    HGB  10.2  HCT  29.2   POD #3  BP: 121/75 ; Pulse: 77 ; Temp: 98.5 F (36.9 C) ; Resp: 18  Patient reports pain as mild. No events throughout the night. Ready  to be discharged to SNF. Neurovascular intact, dorsiflexion/plantar flexion intact, incision: dressing C/D/I, no cellulitis present and compartment soft.   LABS   No new labs  Discharge Exam: General appearance: alert, cooperative and no distress Extremities: Homans sign is negative, no sign of DVT, no edema, redness or tenderness in the calves or thighs and no ulcers, gangrene or trophic changes  Disposition: [place] with follow up in 2 weeks   Follow-up Information   Follow up with Mauri Pole, MD. Schedule an appointment  as soon as possible for a visit in 2 weeks.   Specialty:  Orthopedic Surgery   Contact information:   [address] [place] [address] [phone]       Discharge Orders   Future Appointments Provider Department Dept Phone   04/11/2014 10:15 AM Sinclair Grooms, MD [facility] [phone]   Future Orders Complete By Expires   Call MD / Call 911  As directed    Comments:     If you experience chest pain or shortness of breath, CALL 911 and be transported to the hospital emergency room.  If you develope a fever above 101 F, pus (white drainage) or increased drainage or redness at the wound, or calf pain, call your surgeon's office.   Change dressing  As directed    Comments:     Maintain surgical dressing for 10-14 days, or until follow up in the clinic.   Constipation Prevention  As directed    Comments:     Drink plenty of fluids.  Prune juice may be helpful.  You may use a stool softener, such as Colace (over the counter) 100 mg twice a day.  Use MiraLax (over the counter) for constipation as needed.   Diet - low sodium heart healthy  As directed    Discharge instructions  As directed    Comments:     Maintain surgical dressing for 10-14 days, or until follow up in the clinic. Follow up in 2 weeks at [facility]. Call with any questions or concerns.   Driving restrictions  As directed    Comments:     No driving for 4 weeks   Increase activity slowly as tolerated  As directed    TED hose  As directed    Comments:     Use stockings (TED hose) for 2 weeks on both leg(s).  You may remove them at night for sleeping.   Weight bearing as tolerated  As directed    Questions:     Laterality:     Extremity:          Medication List    STOP taking these medications       HYDROcodone-acetaminophen 5-325 MG per tablet  Commonly known as:  NORCO/VICODIN  Replaced by:  HYDROcodone-acetaminophen 7.5-325 MG per tablet     naproxen  250 MG tablet  Commonly known as:  NAPROSYN      TAKE these medications       cholecalciferol 1000 UNITS tablet  Commonly known as:  VITAMIN D  Take 1,000 Units by mouth daily.     clopidogrel 75 MG tablet  Commonly known as:  PLAVIX  Take 75 mg by mouth daily with breakfast.     DSS 100 MG Caps  Take 100 mg by mouth 2 (two) times daily.     ferrous sulfate 325 (65 FE) MG tablet  Take 1 tablet (325 mg total) by mouth 3 (three)  times daily after meals.     hydrochlorothiazide 25 MG tablet  Commonly known as:  HYDRODIURIL  Take 25 mg by mouth daily with breakfast.     HYDROcodone-acetaminophen 7.5-325 MG per tablet  Commonly known as:  NORCO  Take 1-2 tablets by mouth every 4 (four) hours as needed for moderate pain.     metoprolol succinate 100 MG 24 hr tablet  Commonly known as:  TOPROL-XL  Take 100 mg by mouth daily with breakfast. Take with or immediately following a meal.     multivitamin with minerals tablet  Take 1 tablet by mouth daily.     OSTEO BI-FLEX JOINT SHIELD PO  Take by mouth 2 (two) times daily.     polyethylene glycol packet  Commonly known as:  MIRALAX / GLYCOLAX  Take 17 g by mouth 2 (two) times daily.     ramipril 10 MG capsule  Commonly known as:  ALTACE  Take 10 mg by mouth 2 (two) times daily.     rosuvastatin 40 MG tablet  Commonly known as:  CRESTOR  Take 40 mg by mouth daily with breakfast.     spiritus frumenti Soln  Commonly known as:  ethyl alcohol  Take 1 each by mouth at bedtime and may repeat dose one time if needed.     Testosterone 20.25 MG/ACT (1.62%) Gel  Place 2 Units onto the skin 1 day or 1 dose.     tiZANidine 4 MG capsule  Commonly known as:  ZANAFLEX  Take 1 capsule (4 mg total) by mouth 3 (three) times daily as needed for muscle spasms.         Signed: West Pugh. Shanica Castellanos   PAC  01/05/2014, 7:55 AM

## 2014-01-05 NOTE — Care Management Note (Signed)
    Page 1 of 1   01/05/2014     11:52:31 AM   CARE MANAGEMENT NOTE 01/05/2014  Patient:  Aaron Berg, Aaron Berg   Account Number:  [id number]  Date Initiated:  01/05/2014  Documentation initiated by:  Sherrin Daisy  Subjective/Objective Assessment:   dx total knee replacemnt-right     Action/Plan:   Plans are for SNF rehab.   Anticipated DC Date:  01/05/2014   Anticipated DC Plan:  SKILLED NURSING FACILITY  In-house referral  Clinical Social Worker      DC Planning Services  CM consult      Choice offered to / List presented to:             Status of service:  Completed, signed off Medicare Important Message given?  NA - LOS <3 / Initial given by admissions (If response is "NO", the following Medicare IM given date fields will be blank) Date Medicare IM given:   Date Additional Medicare IM given:    Discharge Disposition:  [place]  Per UR Regulation:    If discussed at Long Length of Stay Meetings, dates discussed:    Comments:

## 2014-01-06 NOTE — Progress Notes (Signed)
Clinical Social Work Department CLINICAL SOCIAL WORK PLACEMENT NOTE 01/06/2014  Patient:  Aaron Berg, Aaron Berg  Account Number:  [id number] Admit date:  01/02/2014  Clinical Social Worker:  Werner Lean, LCSW  Date/time:  01/03/2014 03:00 PM  Clinical Social Work is seeking post-discharge placement for this patient at the following level of care:   SKILLED NURSING   (*CSW will update this form in Epic as items are completed)     Patient/family provided with [place] Department of Clinical Social Work's list of facilities offering this level of care within the geographic area requested by the patient (or if unable, by the patient's family).  01/03/2014  Patient/family informed of their freedom to choose among providers that offer the needed level of care, that participate in Medicare, Medicaid or managed care program needed by the patient, have an available bed and are willing to accept the patient.    Patient/family informed of [facility]' ownership interest in [facility], as well as of the fact that they are under no obligation to receive care at this facility.  PASARR submitted to EDS on 01/03/2014 PASARR number received from EDS on 01/03/2014  FL2 transmitted to all facilities in geographic area requested by pt/family on  01/03/2014 FL2 transmitted to all facilities within larger geographic area on   Patient informed that his/her managed care company has contracts with or will negotiate with  certain facilities, including the following:     Patient/family informed of bed offers received:  01/03/2014 Patient chooses bed at [facility] Physician recommends and patient chooses bed at    Patient to be transferred to [facility] on  01/05/2014 Patient to be transferred to facility by [facility]  The following physician request were entered in Epic:   Additional Comments:  Werner Lean LCSW [phone]

## 2014-01-09 ENCOUNTER — Non-Acute Institutional Stay (SKILLED_NURSING_FACILITY): Payer: Medicare Other | Admitting: Adult Health

## 2014-01-09 DIAGNOSIS — K5900 Constipation, unspecified: Secondary | ICD-10-CM

## 2014-01-09 DIAGNOSIS — Z96659 Presence of unspecified artificial knee joint: Secondary | ICD-10-CM | POA: Diagnosis not present

## 2014-01-09 DIAGNOSIS — M25569 Pain in unspecified knee: Secondary | ICD-10-CM

## 2014-01-09 DIAGNOSIS — E291 Testicular hypofunction: Secondary | ICD-10-CM

## 2014-01-09 DIAGNOSIS — E785 Hyperlipidemia, unspecified: Secondary | ICD-10-CM | POA: Diagnosis not present

## 2014-01-09 DIAGNOSIS — M25561 Pain in right knee: Secondary | ICD-10-CM

## 2014-01-09 DIAGNOSIS — I10 Essential (primary) hypertension: Secondary | ICD-10-CM

## 2014-01-10 ENCOUNTER — Other Ambulatory Visit: Payer: Self-pay | Admitting: *Deleted

## 2014-01-10 MED ORDER — HYDROCODONE-ACETAMINOPHEN 7.5-325 MG PO TABS: ORAL_TABLET | ORAL | Status: DC

## 2014-01-13 DIAGNOSIS — M25561 Pain in right knee: Secondary | ICD-10-CM | POA: Insufficient documentation

## 2014-01-13 DIAGNOSIS — K5900 Constipation, unspecified: Secondary | ICD-10-CM | POA: Insufficient documentation

## 2014-01-13 NOTE — Progress Notes (Signed)
Patient ID: Aaron Berg, male   DOB: Aug 26, 1936, 78 y.o.   MRN: [id number]               PROGRESS NOTE  DATE: 01/09/2014  FACILITY: Nursing Home Location: [facility]  LEVEL OF CARE: SNF (31)  Acute Visit  CHIEF COMPLAINT:  Follow-up Hospitalization  HISTORY OF PRESENT ILLNESS: This is a 78 year old male who has been admitted to [facility] on 01/05/14 from [facility] with End Stage Arthritis S/P total knee arthroplasty. He complains of moderate pain on his right knee. He has been admitted for a short-term rehabilitation.   REASSESSMENT OF ONGOING PROBLEM(S):   HTN: Pt 's HTN remains stable.  Denies CP, sob, DOE, pedal edema, headaches, dizziness or visual disturbances.  No complications from the medications currently being used.  Last BP :106/64  HYPERLIPIDEMIA: No complications from the medications presently being used. 10/14 HDL 53  CONSTIPATION: The constipation remains stable. No complications from the medications presently being used. Patient denies ongoing constipation, abdominal pain, nausea or vomiting.   PAST MEDICAL HISTORY : Reviewed.  No changes.  CURRENT MEDICATIONS: Reviewed per [facility]  REVIEW OF SYSTEMS:  GENERAL: no change in appetite, no fatigue, no weight changes, no fever, chills or weakness RESPIRATORY: no cough, SOB, DOE, wheezing, hemoptysis CARDIAC: no chest pain, edema or palpitations GI: no abdominal pain, diarrhea, constipation, heart burn, nausea or vomiting  PHYSICAL EXAMINATION  VS:  T99       P81      RR20      BP106/64     POX 99%     WT (Lb)  GENERAL: no acute distress, morbidly obese EYES: conjunctivae normal, sclerae normal, normal eye lids NECK: supple, trachea midline, no neck masses, no thyroid tenderness, no thyromegaly LYMPHATICS: no LAN in the neck, no supraclavicular LAN RESPIRATORY: breathing is even & unlabored, BS CTAB CARDIAC: RRR, no murmur,no extra heart sounds, no edema GI: abdomen soft, normal  BS, no masses, no tenderness,   PSYCHIATRIC: the patient is alert & oriented to person, affect & behavior appropriate  LABS/RADIOLOGY: Labs reviewed: Basic Metabolic Panel:  Recent Labs  12/26/13 1300 01/03/14 0458 01/04/14 0455  NA 141 139 138  K 4.6 4.3 4.0  CL 100 103 99  CO2 26 27 26   GLUCOSE 101* 121* 151*  BUN 26* 24* 24*  CREATININE 1.09 1.26 1.32  CALCIUM 10.4 9.0 9.4   Liver Function Tests:  Recent Labs  10/05/13 1137  AST 23  ALT 17  ALKPHOS 41  BILITOT 0.5  PROT 6.8  ALBUMIN 4.1   CBC:  Recent Labs  10/05/13 1137 12/26/13 1300 01/03/14 0458 01/04/14 0455  WBC 5.4 8.8 7.4 10.9*  NEUTROABS 3.3  --   --   --   HGB 13.2 14.3 10.7* 10.2*  HCT 38.6* 41.7 31.9* 29.2*  MCV 92.8 95.6 97.3 95.7  PLT 141* 134* 97* 106*   Lipid Panel:  Recent Labs  10/05/13 1137  HDL 53    ASSESSMENT/PLAN:  End stage arthritis status post right total knee arthroplasty - for rehabilitation  Hypertension - well controlled  Hyperlipidemia - continue Crestor  Male hypogonadism - continue testosterone  Constipation - continue Colace and MiraLax  Right knee pain - start Norco 7.5/325 mg 2 tabs by mouth every 4 hours    CPT CODE: 16010

## 2014-01-18 ENCOUNTER — Non-Acute Institutional Stay (SKILLED_NURSING_FACILITY): Payer: Medicare Other | Admitting: Internal Medicine

## 2014-01-18 DIAGNOSIS — M1710 Unilateral primary osteoarthritis, unspecified knee: Secondary | ICD-10-CM

## 2014-01-18 DIAGNOSIS — D62 Acute posthemorrhagic anemia: Secondary | ICD-10-CM | POA: Diagnosis not present

## 2014-01-18 DIAGNOSIS — J449 Chronic obstructive pulmonary disease, unspecified: Secondary | ICD-10-CM | POA: Diagnosis not present

## 2014-01-18 DIAGNOSIS — I10 Essential (primary) hypertension: Secondary | ICD-10-CM | POA: Diagnosis not present

## 2014-01-18 DIAGNOSIS — IMO0002 Reserved for concepts with insufficient information to code with codable children: Principal | ICD-10-CM

## 2014-01-19 ENCOUNTER — Encounter (HOSPITAL_COMMUNITY): Payer: Self-pay | Admitting: Pharmacy Technician

## 2014-01-25 ENCOUNTER — Encounter (HOSPITAL_COMMUNITY): Payer: Self-pay

## 2014-01-25 ENCOUNTER — Encounter (HOSPITAL_COMMUNITY)
Admission: RE | Admit: 2014-01-25 | Discharge: 2014-01-25 | Disposition: A | Discharge status: Home / Self Care | Payer: Medicare Other | Source: Ambulatory Visit | Attending: Orthopedic Surgery | Admitting: Orthopedic Surgery

## 2014-01-25 ENCOUNTER — Other Ambulatory Visit (HOSPITAL_COMMUNITY): Payer: Self-pay | Admitting: Orthopedic Surgery

## 2014-01-25 DIAGNOSIS — Z01812 Encounter for preprocedural laboratory examination: Secondary | ICD-10-CM | POA: Insufficient documentation

## 2014-01-25 LAB — BASIC METABOLIC PANEL
BUN: 26 mg/dL — ABNORMAL HIGH (ref 6–23)
CALCIUM: 10.4 mg/dL (ref 8.4–10.5)
CO2: 27 mEq/L (ref 19–32)
Chloride: 99 mEq/L (ref 96–112)
Creatinine, Ser: 1.24 mg/dL (ref 0.50–1.35)
GFR calc Af Amer: 64 mL/min — ABNORMAL LOW (ref >90)
GFR, EST NON AFRICAN AMERICAN: 55 mL/min — AB (ref >90)
Glucose, Bld: 109 mg/dL — ABNORMAL HIGH (ref 70–99)
Potassium: 4 mEq/L (ref 3.7–5.3)
SODIUM: 138 meq/L (ref 137–147)

## 2014-01-25 LAB — CBC
HCT: 34.8 % — ABNORMAL LOW (ref 39.0–52.0)
Hemoglobin: 11.3 g/dL — ABNORMAL LOW (ref 13.0–17.0)
MCH: 31 pg (ref 26.0–34.0)
MCHC: 32.5 g/dL (ref 30.0–36.0)
MCV: 95.3 fL (ref 78.0–100.0)
PLATELETS: 211 10*3/uL (ref 150–400)
RBC: 3.65 MIL/uL — ABNORMAL LOW (ref 4.22–5.81)
RDW: 14.2 % (ref 11.5–15.5)
WBC: 7.6 10*3/uL (ref 4.0–10.5)

## 2014-01-25 LAB — URINALYSIS, ROUTINE W REFLEX MICROSCOPIC
Glucose, UA: NEGATIVE mg/dL
HGB URINE DIPSTICK: NEGATIVE
KETONES UR: NEGATIVE mg/dL
Leukocytes, UA: NEGATIVE
NITRITE: NEGATIVE
PH: 5.5 (ref 5.0–8.0)
Protein, ur: NEGATIVE mg/dL
SPECIFIC GRAVITY, URINE: 1.024 (ref 1.005–1.030)
UROBILINOGEN UA: 1 mg/dL (ref 0.0–1.0)

## 2014-01-25 LAB — APTT: APTT: 51 s — AB (ref 24–37)

## 2014-01-25 LAB — PROTIME-INR
INR: 1.07 (ref 0.00–1.49)
PROTHROMBIN TIME: 13.7 s (ref 11.6–15.2)

## 2014-01-25 LAB — SURGICAL PCR SCREEN
MRSA, PCR: NEGATIVE
Staphylococcus aureus: NEGATIVE

## 2014-01-25 NOTE — Patient Instructions (Addendum)
      Your procedure is scheduled on:  01/31/14  TUESDAY  Report to [facility] at    0730   AM.  Call this number if you have problems the morning of surgery: [phone]     IF YOU USE A CPAP MACHINE, PLEASE [place]   Do not eat food  Or drink :After Midnight. Monday NIGHT   Take these medicines the morning of surgery with A SIP OF WATER: METOPROLOL          MAY TAKE TRAMADOL or robaxin if needed   .  Contacts, dentures or partial plates, or metal hairpins  can not be worn to surgery. Your family will be responsible for glasses, dentures, hearing aides while you are in surgery  Leave suitcase in the car. After surgery it may be brought to your room.  For patients admitted to the hospital, checkout time is 11:00 AM day of  discharge.                DO NOT WEAR JEWELRY, LOTIONS, POWDERS, OR PERFUMES.  WOMEN-- DO NOT SHAVE LEGS OR UNDERARMS FOR 48 HOURS BEFORE SHOWERS. MEN MAY SHAVE FACE.  Patients discharged the day of surgery will not be allowed to drive home. IF going home the day of surgery, you must have a driver and someone to stay with you for the first 24 hours  Name and phone number of your driver:    admission                                                                    Please read over the following fact sheets that you were given: MRSA Information, Incentive Spirometry Sheet, Blood Transfusion Sheet  Information    Aaron Berg  PST 336  [phone]                 [place]                                                  Patient Signature _____________________________

## 2014-01-25 NOTE — Progress Notes (Signed)
Chest x ray 1/15, EKG with LOV Dr Linard Millers 10/14 EPIC

## 2014-01-26 ENCOUNTER — Encounter (HOSPITAL_COMMUNITY): Payer: Self-pay

## 2014-01-26 NOTE — Progress Notes (Signed)
Late entry for 01/25/14-  Daughter to contact Dr Honor Loh office regarding Plavix- patient still taking at pre op, states they are unsure when/if to stop

## 2014-01-26 NOTE — Progress Notes (Signed)
Faxed urine, bmet to Dr Alvan Dame via [facility]

## 2014-01-29 DIAGNOSIS — IMO0002 Reserved for concepts with insufficient information to code with codable children: Principal | ICD-10-CM

## 2014-01-29 DIAGNOSIS — M1710 Unilateral primary osteoarthritis, unspecified knee: Secondary | ICD-10-CM | POA: Insufficient documentation

## 2014-01-29 DIAGNOSIS — J4489 Other specified chronic obstructive pulmonary disease: Secondary | ICD-10-CM | POA: Insufficient documentation

## 2014-01-29 DIAGNOSIS — J449 Chronic obstructive pulmonary disease, unspecified: Secondary | ICD-10-CM | POA: Insufficient documentation

## 2014-01-29 NOTE — H&P (Signed)
TOTAL KNEE ADMISSION H&P  Patient is being admitted for left total knee arthroplasty.  Subjective:  Chief Complaint:      Left knee OA / pain.  HPI: Aaron Berg, 78 y.o. male, has a history of pain and functional disability in the left knee due to arthritis and has failed non-surgical conservative treatments for greater than 12 weeks to includeNSAID's and/or analgesics, corticosteriod injections, use of assistive devices and activity modification.  Onset of symptoms was gradual, starting 7+ years ago with gradually worsening course since that time. The patient noted prior procedures on the knee to include  arthroplasty on the right knee(s).  Patient currently rates pain in the left knee(s) at 7 out of 10 with activity. Patient has worsening of pain with activity and weight bearing, pain that interferes with activities of daily living, pain with passive range of motion, crepitus and joint swelling.  Patient has evidence of periarticular osteophytes and joint space narrowing by imaging studies. There is no active infection.  Risks, benefits and expectations were discussed with the patient.  Risks including but not limited to the risk of anesthesia, blood clots, nerve damage, blood vessel damage, failure of the prosthesis, infection and up to and including death.  Patient understand the risks, benefits and expectations and wishes to proceed with surgery.   D/C Plans:   SNF  Post-op Meds:     No Rx given  Tranexamic Acid:   Not to be given - CAD  Decadron:    To be given  FYI:    ASA post-op  Norco 5/325 post-op  CPAP   Patient Active Problem List   Diagnosis Date Noted  . Constipation 01/13/2014  . Right knee pain 01/13/2014  . Expected blood loss anemia 01/04/2014  . Morbid obesity 01/04/2014  . S/P right TKA 01/02/2014  . Chronic combined systolic and diastolic CHF (congestive heart failure) 10/11/2013  . Essential hypertension, benign 10/11/2013  . ASHD (arteriosclerotic heart  disease) 07/09/2011  . Hyperlipidemia LDL goal < 70 07/09/2011  . Sleep apnea 07/09/2011  . Alcohol abuse, daily use 07/09/2011  . Hypogonadism male 07/09/2011   Past Medical History  Diagnosis Date  . ASHD (arteriosclerotic heart disease)   . Dyslipidemia     well controlled  . Obesity   . Arthritis   . Male hypogonadism   . Gait disorder   . Hypertension   . Dysrhythmia 2003    AFib after CABG; warfarin x 6 months  . Sleep apnea     cpap  . CHF (congestive heart failure)   . Dry eyes   . Complication of anesthesia     "woke up too soon"  . Depression   . COPD (chronic obstructive pulmonary disease)     "no signs or tests"  . Headache(784.0)     silent migraines- no pain just go blind for just a minute  . Cancer     skin  . Memory loss, short term 2/15    per daughter since last surgery and placement at [facility].  "Hallucinations have stopped"    Past Surgical History  Procedure Laterality Date  . Lumbar cyst      Dr. Joya Salm  . Shoulder surgery      Right with murphy, reattach ligaments  . Coronary artery bypass graft  2003    x 4  . Video assisted thoracoscopy (vats)/thorocotomy  2002    benign nodule  . Joint replacement      right shoulder replaced  .  Coronary angioplasty  1991    with stents placed  . Eye surgery Bilateral     cataract surgeries  . Eye surgery Right     detached retina  . Tonsillectomy  as child  . Appendectomy  as child  . Mohs surgery      on chest  . Total knee arthroplasty Right 01/02/2014    Procedure: RIGHT TOTAL KNEE ARTHROPLASTY;  Surgeon: Mauri Pole, MD;  Location: [facility];  Service: Orthopedics;  Laterality: Right;    No prescriptions prior to admission   Allergies  Allergen Reactions  . Procardia [Nifedipine] Rash    All over body    History  Substance Use Topics  . Smoking status: Former Smoker -- 30 years    Types: Cigarettes    Quit date: 06/17/1989  . Smokeless tobacco: Never Used  . Alcohol Use: 0.0 oz/week      Comment: gallon over 5 days/  nothing since  01/16/14    Family History  Problem Relation Age of Onset  . Pancreatic cancer Father   . CAD Father   . CAD Mother      Review of Systems  Constitutional: Negative.   HENT: Negative.   Eyes: Negative.   Respiratory: Negative.   Cardiovascular: Negative.   Gastrointestinal: Positive for constipation.  Genitourinary: Negative.   Musculoskeletal: Positive for back pain and joint pain.  Skin: Negative.   Neurological: Negative.   Endo/Heme/Allergies: Negative.   Psychiatric/Behavioral: Negative.     Objective:  Physical Exam  Constitutional: He is oriented to person, place, and time. He appears well-developed and well-nourished.  HENT:  Head: Normocephalic and atraumatic.  Mouth/Throat: Oropharynx is clear and moist.  Eyes: Pupils are equal, round, and reactive to light.  Neck: Neck supple. No JVD present. No tracheal deviation present. No thyromegaly present.  Cardiovascular: Normal rate, regular rhythm, normal heart sounds and intact distal pulses.   Respiratory: Effort normal and breath sounds normal. No respiratory distress. He has no wheezes.  GI: Soft. There is no tenderness. There is no guarding.  Musculoskeletal:       Left knee: He exhibits decreased range of motion, swelling and bony tenderness. He exhibits no ecchymosis, no deformity, no laceration and no erythema. Tenderness found.  Lymphadenopathy:    He has no cervical adenopathy.  Neurological: He is alert and oriented to person, place, and time.  Skin: Skin is warm and dry.  Psychiatric: He has a normal mood and affect.    Labs:  Estimated body mass index is 41.62 kg/(m^2) as calculated from the following:   Height as of 10/11/13: 5' 8.5" (1.74 m).   Weight as of 01/02/14: 126 kg (277 lb 12.5 oz).   Imaging Review Plain radiographs demonstrate severe degenerative joint disease of the left knee(s). The overall alignment isneutral. The bone quality appears  to be good for age and reported activity level.  Assessment/Plan:  End stage arthritis, left knee   The patient history, physical examination, clinical judgment of the provider and imaging studies are consistent with end stage degenerative joint disease of the left knee(s) and total knee arthroplasty is deemed medically necessary. The treatment options including medical management, injection therapy arthroscopy and arthroplasty were discussed at length. The risks and benefits of total knee arthroplasty were presented and reviewed. The risks due to aseptic loosening, infection, stiffness, patella tracking problems, thromboembolic complications and other imponderables were discussed. The patient acknowledged the explanation, agreed to proceed with the plan and consent was signed.  Patient is being admitted for inpatient treatment for surgery, pain control, PT, OT, prophylactic antibiotics, VTE prophylaxis, progressive ambulation and ADL's and discharge planning. The patient is planning to be discharged to skilled nursing facility.      West Pugh Sherica Paternostro   PAC  01/29/2014, 6:47 PM

## 2014-01-29 NOTE — Progress Notes (Signed)
HISTORY & PHYSICAL  DATE: 01/18/2014   FACILITY: [facility]  LEVEL OF CARE: SNF (31)  ALLERGIES:  Allergies  Allergen Reactions  . Procardia [Nifedipine] Rash    All over body    CHIEF COMPLAINT:  Manage right knee OA, acute blood loss anemia & COPD   HISTORY OF PRESENT ILLNESS: pt is a 78 y/o Caucasian male:  KNEE OSTEOARTHRITIS: Patient had a history of pain and functional disability in the knee due to end-stage osteoarthritis and has failed nonsurgical conservative treatments. Patient had worsening of pain with activity and weight bearing, pain that interfered with activities of daily living & pain with passive range of motion. Therefore patient underwent total knee arthroplasty and tolerated the procedure well. Patient is admitted to this facility for sort short-term rehabilitation. Patient is confused & a poor historian.  ANEMIA: The anemia has been stable. The staff deny fatigue, melena or hematochezia. No complications from the medications currently being used.  Last Hb are 10.7 & 10.2.  COPD: the COPD remains stable.  The staff deny sob, cough, wheezing or declining exercise tolerance.  No complications from the medications presently being used.  PAST MEDICAL HISTORY :  Past Medical History  Diagnosis Date  . ASHD (arteriosclerotic heart disease)   . Dyslipidemia     well controlled  . Obesity   . Arthritis   . Male hypogonadism   . Gait disorder   . Hypertension   . Dysrhythmia 2003    AFib after CABG; warfarin x 6 months  . Sleep apnea     cpap  . CHF (congestive heart failure)   . Dry eyes   . Complication of anesthesia     "woke up too soon"  . Depression   . COPD (chronic obstructive pulmonary disease)     "no signs or tests"  . Headache(784.0)     silent migraines- no pain just go blind for just a minute  . Cancer     skin  . Memory loss, short term 2/15    per daughter since last surgery and placement at [facility].   "Hallucinations have stopped"    PAST SURGICAL HISTORY: Past Surgical History  Procedure Laterality Date  . Lumbar cyst      Dr. Joya Salm  . Shoulder surgery      Right with murphy, reattach ligaments  . Coronary artery bypass graft  2003    x 4  . Video assisted thoracoscopy (vats)/thorocotomy  2002    benign nodule  . Joint replacement      right shoulder replaced  . Coronary angioplasty  1991    with stents placed  . Eye surgery Bilateral     cataract surgeries  . Eye surgery Right     detached retina  . Tonsillectomy  as child  . Appendectomy  as child  . Mohs surgery      on chest  . Total knee arthroplasty Right 01/02/2014    Procedure: RIGHT TOTAL KNEE ARTHROPLASTY;  Surgeon: Mauri Pole, MD;  Location: [facility];  Service: Orthopedics;  Laterality: Right;    SOCIAL HISTORY:  reports that he quit smoking about 24 years ago. His smoking use included Cigarettes. He smoked 0.00 packs per day for 30 years. He has never used smokeless tobacco. He reports that he drinks alcohol. He reports that he does not use illicit drugs.  FAMILY HISTORY:  Family History  Problem Relation Age of Onset  .  Pancreatic cancer Father   . CAD Father   . CAD Mother     CURRENT MEDICATIONS: Reviewed per [facility]  REVIEW OF SYSTEMS: unobtainable due to confusion.  PHYSICAL EXAMINATION  VS:  See VS section  GENERAL: no acute distress, morbidly obese body habitus EYES: conjunctivae normal, sclerae normal, normal eye lids MOUTH/THROAT: lips without lesions,no lesions in the mouth,tongue is without lesions,uvula elevates in midline NECK: supple, trachea midline, no neck masses, no thyroid tenderness, no thyromegaly LYMPHATICS: no LAN in the neck, no supraclavicular LAN RESPIRATORY: breathing is even & unlabored, BS CTAB CARDIAC: RRR, no murmur,no extra heart sounds, RLE +4, &  LLE +3  edema GI:  ABDOMEN: abdomen soft, normal BS, no masses, no tenderness  LIVER/SPLEEN: no hepatomegaly, no  splenomegaly MUSCULOSKELETAL: HEAD: normal to inspection & palpation BACK: no kyphosis, scoliosis or spinal processes tenderness EXTREMITIES: LEFT UPPER EXTREMITY: full range of motion, normal strength & tone RIGHT UPPER EXTREMITY:  full range of motion, normal strength & tone LEFT LOWER EXTREMITY:  moderate range of motion, normal strength & tone RIGHT LOWER EXTREMITY:   range of motion not tested due to surgery, normal strength & tone PSYCHIATRIC: the patient is alert & disoriented, affect & behavior appropriate  LABS/RADIOLOGY:  01-10-14 Hb 9.2, mcv 98.7 ow cbc nl, bun 27, tp 5.4, alb 3 ow cmp nl  Labs reviewed: Basic Metabolic Panel:  Recent Labs  01/03/14 0458 01/04/14 0455 01/25/14 1450  NA 139 138 138  K 4.3 4.0 4.0  CL 103 99 99  CO2 27 26 27   GLUCOSE 121* 151* 109*  BUN 24* 24* 26*  CREATININE 1.26 1.32 1.24  CALCIUM 9.0 9.4 10.4   Liver Function Tests:  Recent Labs  10/05/13 1137  AST 23  ALT 17  ALKPHOS 41  BILITOT 0.5  PROT 6.8  ALBUMIN 4.1   CBC:  Recent Labs  10/05/13 1137  01/03/14 0458 01/04/14 0455 01/25/14 1450  WBC 5.4  < > 7.4 10.9* 7.6  NEUTROABS 3.3  --   --   --   --   HGB 13.2  < > 10.7* 10.2* 11.3*  HCT 38.6*  < > 31.9* 29.2* 34.8*  MCV 92.8  < > 97.3 95.7 95.3  PLT 141*  < > 97* 106* 211  < > = values in this interval not displayed.  Lipid Panel:  Recent Labs  10/05/13 1137  HDL 53    CHEST  2 VIEW   COMPARISON:  PA and lateral chest x-ray of March 06, 2011   FINDINGS: The lungs are mildly hyper inflated. There is no focal infiltrate. The cardiac silhouette is top-normal in size. The pulmonary vascularity is not engorged. There are old fractures of the posterior aspects of the right 7th and 8th ribs. There is stable pleural thickening along the left mid lateral thoracic wall. The patient has undergone previous CABG.   IMPRESSION: There are post CABG changes present and findings consistent with COPD. There is no  evidence of pneumonia nor CHF.    ASSESSMENT/PLAN:  Right knee OA-s/p total knee arthroplasty.  Cont rehab. COPD-well compensated Acute blood loss anemia-Hb declined.  Cont. Fe & monitor. HTN-stable. Constipation-well controlled.  I have reviewed patient's medical records received at admission/from hospitalization.  CPT CODE: 16967  Katera Rybka Y Fedora Knisely, [place] [phone]

## 2014-01-30 NOTE — Progress Notes (Signed)
Per daughterEdmonia Lynch-  Plavix has been stopped. She hopefully will bring copy of medicines am of OR.  I will attempt to obtain copy from [place]

## 2014-01-31 ENCOUNTER — Encounter (HOSPITAL_COMMUNITY): Payer: Self-pay | Admitting: *Deleted

## 2014-01-31 ENCOUNTER — Inpatient Hospital Stay (HOSPITAL_COMMUNITY): Payer: Medicare Other | Admitting: *Deleted

## 2014-01-31 ENCOUNTER — Inpatient Hospital Stay (HOSPITAL_COMMUNITY)
Admission: RE | Admit: 2014-01-31 | Discharge: 2014-02-03 | DRG: 469 | Disposition: A | Discharge status: Skilled Nursing Facility | Payer: Medicare Other | Source: Ambulatory Visit | Attending: Orthopedic Surgery | Admitting: Orthopedic Surgery

## 2014-01-31 ENCOUNTER — Encounter (HOSPITAL_COMMUNITY): Payer: Medicare Other | Admitting: *Deleted

## 2014-01-31 ENCOUNTER — Encounter (HOSPITAL_COMMUNITY)
Admission: RE | Disposition: A | Discharge status: Skilled Nursing Facility | Payer: Self-pay | Source: Ambulatory Visit | Attending: Orthopedic Surgery

## 2014-01-31 DIAGNOSIS — Z96659 Presence of unspecified artificial knee joint: Secondary | ICD-10-CM | POA: Diagnosis not present

## 2014-01-31 DIAGNOSIS — Z6841 Body Mass Index (BMI) 40.0 and over, adult: Secondary | ICD-10-CM | POA: Diagnosis not present

## 2014-01-31 DIAGNOSIS — J4489 Other specified chronic obstructive pulmonary disease: Secondary | ICD-10-CM | POA: Diagnosis present

## 2014-01-31 DIAGNOSIS — D62 Acute posthemorrhagic anemia: Secondary | ICD-10-CM | POA: Diagnosis not present

## 2014-01-31 DIAGNOSIS — Z9861 Coronary angioplasty status: Secondary | ICD-10-CM | POA: Diagnosis not present

## 2014-01-31 DIAGNOSIS — IMO0002 Reserved for concepts with insufficient information to code with codable children: Secondary | ICD-10-CM | POA: Diagnosis not present

## 2014-01-31 DIAGNOSIS — S8990XA Unspecified injury of unspecified lower leg, initial encounter: Secondary | ICD-10-CM | POA: Diagnosis not present

## 2014-01-31 DIAGNOSIS — Z01812 Encounter for preprocedural laboratory examination: Secondary | ICD-10-CM | POA: Diagnosis not present

## 2014-01-31 DIAGNOSIS — M6580 Other synovitis and tenosynovitis, unspecified site: Secondary | ICD-10-CM | POA: Diagnosis present

## 2014-01-31 DIAGNOSIS — Z8249 Family history of ischemic heart disease and other diseases of the circulatory system: Secondary | ICD-10-CM

## 2014-01-31 DIAGNOSIS — G4730 Sleep apnea, unspecified: Secondary | ICD-10-CM | POA: Diagnosis present

## 2014-01-31 DIAGNOSIS — E785 Hyperlipidemia, unspecified: Secondary | ICD-10-CM | POA: Diagnosis present

## 2014-01-31 DIAGNOSIS — I2510 Atherosclerotic heart disease of native coronary artery without angina pectoris: Secondary | ICD-10-CM | POA: Diagnosis present

## 2014-01-31 DIAGNOSIS — K5900 Constipation, unspecified: Secondary | ICD-10-CM | POA: Diagnosis present

## 2014-01-31 DIAGNOSIS — M1710 Unilateral primary osteoarthritis, unspecified knee: Principal | ICD-10-CM | POA: Diagnosis present

## 2014-01-31 DIAGNOSIS — Z951 Presence of aortocoronary bypass graft: Secondary | ICD-10-CM | POA: Diagnosis not present

## 2014-01-31 DIAGNOSIS — M6281 Muscle weakness (generalized): Secondary | ICD-10-CM | POA: Diagnosis not present

## 2014-01-31 DIAGNOSIS — R413 Other amnesia: Secondary | ICD-10-CM | POA: Diagnosis present

## 2014-01-31 DIAGNOSIS — I509 Heart failure, unspecified: Secondary | ICD-10-CM | POA: Diagnosis present

## 2014-01-31 DIAGNOSIS — E291 Testicular hypofunction: Secondary | ICD-10-CM | POA: Diagnosis present

## 2014-01-31 DIAGNOSIS — Z96619 Presence of unspecified artificial shoulder joint: Secondary | ICD-10-CM | POA: Diagnosis not present

## 2014-01-31 DIAGNOSIS — E669 Obesity, unspecified: Secondary | ICD-10-CM | POA: Diagnosis present

## 2014-01-31 DIAGNOSIS — F329 Major depressive disorder, single episode, unspecified: Secondary | ICD-10-CM | POA: Diagnosis not present

## 2014-01-31 DIAGNOSIS — Z471 Aftercare following joint replacement surgery: Secondary | ICD-10-CM | POA: Diagnosis not present

## 2014-01-31 DIAGNOSIS — I5042 Chronic combined systolic (congestive) and diastolic (congestive) heart failure: Secondary | ICD-10-CM | POA: Diagnosis present

## 2014-01-31 DIAGNOSIS — Z87891 Personal history of nicotine dependence: Secondary | ICD-10-CM | POA: Diagnosis not present

## 2014-01-31 DIAGNOSIS — R269 Unspecified abnormalities of gait and mobility: Secondary | ICD-10-CM | POA: Diagnosis not present

## 2014-01-31 DIAGNOSIS — S99919A Unspecified injury of unspecified ankle, initial encounter: Secondary | ICD-10-CM | POA: Diagnosis not present

## 2014-01-31 DIAGNOSIS — J449 Chronic obstructive pulmonary disease, unspecified: Secondary | ICD-10-CM | POA: Diagnosis present

## 2014-01-31 DIAGNOSIS — G8918 Other acute postprocedural pain: Secondary | ICD-10-CM | POA: Diagnosis not present

## 2014-01-31 DIAGNOSIS — F3289 Other specified depressive episodes: Secondary | ICD-10-CM | POA: Diagnosis not present

## 2014-01-31 DIAGNOSIS — E43 Unspecified severe protein-calorie malnutrition: Secondary | ICD-10-CM | POA: Diagnosis not present

## 2014-01-31 DIAGNOSIS — M25569 Pain in unspecified knee: Secondary | ICD-10-CM | POA: Diagnosis not present

## 2014-01-31 DIAGNOSIS — I4891 Unspecified atrial fibrillation: Secondary | ICD-10-CM | POA: Diagnosis present

## 2014-01-31 DIAGNOSIS — I10 Essential (primary) hypertension: Secondary | ICD-10-CM | POA: Diagnosis present

## 2014-01-31 DIAGNOSIS — M1990 Unspecified osteoarthritis, unspecified site: Secondary | ICD-10-CM | POA: Diagnosis not present

## 2014-01-31 HISTORY — PX: TOTAL KNEE ARTHROPLASTY: SHX125

## 2014-01-31 LAB — TYPE AND SCREEN
ABO/RH(D): A POS
ANTIBODY SCREEN: NEGATIVE

## 2014-01-31 SURGERY — ARTHROPLASTY, KNEE, TOTAL
Anesthesia: General | Site: Knee | Laterality: Left

## 2014-01-31 MED ORDER — METOCLOPRAMIDE HCL 5 MG/ML IJ SOLN
INTRAMUSCULAR | Status: AC
  Filled 2014-01-31: qty 2

## 2014-01-31 MED ORDER — HYDROMORPHONE HCL PF 1 MG/ML IJ SOLN: 0.5000 mg | INTRAMUSCULAR | Status: DC | PRN

## 2014-01-31 MED ORDER — DEXAMETHASONE SODIUM PHOSPHATE 10 MG/ML IJ SOLN: 10.0000 mg | Freq: Once | INTRAMUSCULAR | Status: DC

## 2014-01-31 MED ORDER — BUPIVACAINE LIPOSOME 1.3 % IJ SUSP
20.0000 mL | Freq: Once | INTRAMUSCULAR | Status: DC
  Filled 2014-01-31: qty 20

## 2014-01-31 MED ORDER — BUPIVACAINE-EPINEPHRINE (PF) 0.25% -1:200000 IJ SOLN
INTRAMUSCULAR | Status: DC | PRN
  Administered 2014-01-31: 25 mL

## 2014-01-31 MED ORDER — METOCLOPRAMIDE HCL 5 MG/ML IJ SOLN: 5.0000 mg | Freq: Three times a day (TID) | INTRAMUSCULAR | Status: DC | PRN

## 2014-01-31 MED ORDER — STERILE WATER FOR IRRIGATION IR SOLN
Status: DC | PRN
  Administered 2014-01-31: 1500 mL

## 2014-01-31 MED ORDER — PROPOFOL 10 MG/ML IV BOLUS
INTRAVENOUS | Status: AC
  Filled 2014-01-31: qty 20

## 2014-01-31 MED ORDER — ONDANSETRON HCL 4 MG/2ML IJ SOLN
INTRAMUSCULAR | Status: DC | PRN
  Administered 2014-01-31: 4 mg via INTRAVENOUS

## 2014-01-31 MED ORDER — METOCLOPRAMIDE HCL 5 MG/ML IJ SOLN
INTRAMUSCULAR | Status: DC | PRN
  Administered 2014-01-31: 10 mg via INTRAVENOUS

## 2014-01-31 MED ORDER — FLEET ENEMA 7-19 GM/118ML RE ENEM: 1.0000 | ENEMA | Freq: Once | RECTAL | Status: AC | PRN

## 2014-01-31 MED ORDER — FERROUS SULFATE 325 (65 FE) MG PO TABS
325.0000 mg | ORAL_TABLET | Freq: Three times a day (TID) | ORAL | Status: DC
  Administered 2014-01-31 – 2014-02-03 (×7): 325 mg via ORAL
  Filled 2014-01-31 (×11): qty 1

## 2014-01-31 MED ORDER — SODIUM CHLORIDE 0.9 % IV BOLUS (SEPSIS)
250.0000 mL | Freq: Once | INTRAVENOUS | Status: AC
  Administered 2014-01-31: 20:00:00 via INTRAVENOUS
  Administered 2014-01-31: 250 mL via INTRAVENOUS

## 2014-01-31 MED ORDER — ONDANSETRON HCL 4 MG/2ML IJ SOLN
INTRAMUSCULAR | Status: AC
  Filled 2014-01-31: qty 2

## 2014-01-31 MED ORDER — SODIUM CHLORIDE 0.9 % IJ SOLN
INTRAMUSCULAR | Status: DC | PRN
  Administered 2014-01-31: 14 mL via INTRAVENOUS

## 2014-01-31 MED ORDER — PROPOFOL 10 MG/ML IV BOLUS
INTRAVENOUS | Status: DC | PRN
  Administered 2014-01-31: 200 mg via INTRAVENOUS

## 2014-01-31 MED ORDER — BUPIVACAINE LIPOSOME 1.3 % IJ SUSP
INTRAMUSCULAR | Status: DC | PRN
  Administered 2014-01-31: 20 mL

## 2014-01-31 MED ORDER — MEPERIDINE HCL 50 MG/ML IJ SOLN: 6.2500 mg | INTRAMUSCULAR | Status: DC | PRN

## 2014-01-31 MED ORDER — METOCLOPRAMIDE HCL 10 MG PO TABS: 5.0000 mg | ORAL_TABLET | Freq: Three times a day (TID) | ORAL | Status: DC | PRN

## 2014-01-31 MED ORDER — FENTANYL CITRATE 0.05 MG/ML IJ SOLN: 25.0000 ug | INTRAMUSCULAR | Status: DC | PRN

## 2014-01-31 MED ORDER — 0.9 % SODIUM CHLORIDE (POUR BTL) OPTIME
TOPICAL | Status: DC | PRN
  Administered 2014-01-31: 1000 mL

## 2014-01-31 MED ORDER — ATORVASTATIN CALCIUM 80 MG PO TABS
80.0000 mg | ORAL_TABLET | Freq: Every day | ORAL | Status: DC
  Administered 2014-01-31 – 2014-02-02 (×3): 80 mg via ORAL
  Filled 2014-01-31 (×4): qty 1

## 2014-01-31 MED ORDER — MENTHOL 3 MG MT LOZG: 1.0000 | LOZENGE | OROMUCOSAL | Status: DC | PRN

## 2014-01-31 MED ORDER — BUPIVACAINE-EPINEPHRINE PF 0.25-1:200000 % IJ SOLN
INTRAMUSCULAR | Status: AC
  Filled 2014-01-31: qty 30

## 2014-01-31 MED ORDER — GLYCOPYRROLATE 0.2 MG/ML IJ SOLN
INTRAMUSCULAR | Status: DC | PRN
  Administered 2014-01-31: 0.3 mg via INTRAVENOUS

## 2014-01-31 MED ORDER — FENTANYL CITRATE 0.05 MG/ML IJ SOLN
INTRAMUSCULAR | Status: AC
  Filled 2014-01-31: qty 2

## 2014-01-31 MED ORDER — PROMETHAZINE HCL 25 MG/ML IJ SOLN: 6.2500 mg | INTRAMUSCULAR | Status: DC | PRN

## 2014-01-31 MED ORDER — FENTANYL CITRATE 0.05 MG/ML IJ SOLN
INTRAMUSCULAR | Status: AC
  Filled 2014-01-31: qty 5

## 2014-01-31 MED ORDER — GLYCOPYRROLATE 0.2 MG/ML IJ SOLN
INTRAMUSCULAR | Status: AC
  Filled 2014-01-31: qty 2

## 2014-01-31 MED ORDER — DIPHENHYDRAMINE HCL 25 MG PO CAPS: 25.0000 mg | ORAL_CAPSULE | Freq: Four times a day (QID) | ORAL | Status: DC | PRN

## 2014-01-31 MED ORDER — CLOPIDOGREL BISULFATE 75 MG PO TABS
75.0000 mg | ORAL_TABLET | Freq: Every day | ORAL | Status: DC
  Administered 2014-02-01 – 2014-02-03 (×3): 75 mg via ORAL
  Filled 2014-01-31 (×4): qty 1

## 2014-01-31 MED ORDER — CEFAZOLIN SODIUM-DEXTROSE 2-3 GM-% IV SOLR
2.0000 g | INTRAVENOUS | Status: AC
  Administered 2014-01-31: 2 g via INTRAVENOUS

## 2014-01-31 MED ORDER — METHOCARBAMOL 100 MG/ML IJ SOLN
500.0000 mg | Freq: Four times a day (QID) | INTRAVENOUS | Status: DC | PRN
  Filled 2014-01-31 (×2): qty 5

## 2014-01-31 MED ORDER — KETOROLAC TROMETHAMINE 30 MG/ML IJ SOLN
INTRAMUSCULAR | Status: AC
  Filled 2014-01-31: qty 1

## 2014-01-31 MED ORDER — TESTOSTERONE 50 MG/5GM (1%) TD GEL: 2.0000 g | Freq: Every day | TRANSDERMAL | Status: DC

## 2014-01-31 MED ORDER — ROCURONIUM BROMIDE 100 MG/10ML IV SOLN
INTRAVENOUS | Status: DC | PRN
  Administered 2014-01-31: 50 mg via INTRAVENOUS

## 2014-01-31 MED ORDER — FENTANYL CITRATE 0.05 MG/ML IJ SOLN
INTRAMUSCULAR | Status: DC | PRN
  Administered 2014-01-31: 25 ug via INTRAVENOUS
  Administered 2014-01-31: 50 ug via INTRAVENOUS
  Administered 2014-01-31: 25 ug via INTRAVENOUS
  Administered 2014-01-31: 50 ug via INTRAVENOUS
  Administered 2014-01-31: 100 ug via INTRAVENOUS
  Administered 2014-01-31 (×2): 50 ug via INTRAVENOUS

## 2014-01-31 MED ORDER — SPIRITUS FRUMENTI: 1.0000 | Freq: Every evening | ORAL | Status: DC | PRN

## 2014-01-31 MED ORDER — BISACODYL 10 MG RE SUPP
10.0000 mg | Freq: Every day | RECTAL | Status: DC | PRN
Start: 2014-01-31 — End: 2014-02-03

## 2014-01-31 MED ORDER — PHENOL 1.4 % MT LIQD: 1.0000 | OROMUCOSAL | Status: DC | PRN

## 2014-01-31 MED ORDER — METOPROLOL SUCCINATE ER 100 MG PO TB24
100.0000 mg | ORAL_TABLET | Freq: Every day | ORAL | Status: DC
  Administered 2014-02-02 – 2014-02-03 (×2): 100 mg via ORAL
  Filled 2014-01-31 (×4): qty 1

## 2014-01-31 MED ORDER — KETOROLAC TROMETHAMINE 30 MG/ML IJ SOLN
INTRAMUSCULAR | Status: DC | PRN
  Administered 2014-01-31: 30 mg

## 2014-01-31 MED ORDER — NEOSTIGMINE METHYLSULFATE 1 MG/ML IJ SOLN
INTRAMUSCULAR | Status: AC
  Filled 2014-01-31: qty 10

## 2014-01-31 MED ORDER — CEFAZOLIN SODIUM-DEXTROSE 2-3 GM-% IV SOLR
INTRAVENOUS | Status: AC
  Filled 2014-01-31: qty 50

## 2014-01-31 MED ORDER — ONDANSETRON HCL 4 MG PO TABS
4.0000 mg | ORAL_TABLET | Freq: Four times a day (QID) | ORAL | Status: DC | PRN
Start: 2014-01-31 — End: 2014-02-03

## 2014-01-31 MED ORDER — ROPIVACAINE HCL 5 MG/ML IJ SOLN
INTRAMUSCULAR | Status: AC
  Filled 2014-01-31: qty 30

## 2014-01-31 MED ORDER — POLYETHYLENE GLYCOL 3350 17 G PO PACK
17.0000 g | PACK | Freq: Two times a day (BID) | ORAL | Status: DC
  Administered 2014-02-01 – 2014-02-03 (×5): 17 g via ORAL

## 2014-01-31 MED ORDER — DEXAMETHASONE SODIUM PHOSPHATE 10 MG/ML IJ SOLN
INTRAMUSCULAR | Status: AC
  Filled 2014-01-31: qty 1

## 2014-01-31 MED ORDER — SODIUM CHLORIDE 0.9 % IR SOLN
Status: DC | PRN
  Administered 2014-01-31: 1000 mL

## 2014-01-31 MED ORDER — ASPIRIN EC 325 MG PO TBEC
325.0000 mg | DELAYED_RELEASE_TABLET | Freq: Two times a day (BID) | ORAL | Status: DC
  Administered 2014-02-01 – 2014-02-03 (×4): 325 mg via ORAL
  Filled 2014-01-31 (×7): qty 1

## 2014-01-31 MED ORDER — METHOCARBAMOL 500 MG PO TABS
500.0000 mg | ORAL_TABLET | Freq: Four times a day (QID) | ORAL | Status: DC | PRN
  Administered 2014-02-01 – 2014-02-03 (×2): 500 mg via ORAL
  Filled 2014-01-31 (×2): qty 1

## 2014-01-31 MED ORDER — SODIUM CHLORIDE 0.9 % IJ SOLN
INTRAMUSCULAR | Status: AC
  Filled 2014-01-31: qty 50

## 2014-01-31 MED ORDER — PHENYLEPHRINE HCL 10 MG/ML IJ SOLN
20.0000 mg | INTRAVENOUS | Status: DC | PRN
  Administered 2014-01-31: 50 ug/min via INTRAVENOUS

## 2014-01-31 MED ORDER — MIDAZOLAM HCL 2 MG/2ML IJ SOLN
INTRAMUSCULAR | Status: AC
  Filled 2014-01-31: qty 2

## 2014-01-31 MED ORDER — PHENYLEPHRINE HCL 10 MG/ML IJ SOLN
INTRAMUSCULAR | Status: DC | PRN
  Administered 2014-01-31 (×3): 80 ug via INTRAVENOUS

## 2014-01-31 MED ORDER — PROPOFOL INFUSION 10 MG/ML OPTIME
INTRAVENOUS | Status: DC | PRN
  Administered 2014-01-31: 100 ug/kg/min via INTRAVENOUS

## 2014-01-31 MED ORDER — NEOSTIGMINE METHYLSULFATE 1 MG/ML IJ SOLN
INTRAMUSCULAR | Status: DC | PRN
  Administered 2014-01-31: 3 mg via INTRAVENOUS

## 2014-01-31 MED ORDER — LACTATED RINGERS IV SOLN
INTRAVENOUS | Status: DC | PRN
  Administered 2014-01-31 (×2): via INTRAVENOUS

## 2014-01-31 MED ORDER — SODIUM CHLORIDE 0.9 % IV SOLN
INTRAVENOUS | Status: DC
  Administered 2014-01-31 – 2014-02-01 (×2): via INTRAVENOUS
  Filled 2014-01-31 (×9): qty 1000

## 2014-01-31 MED ORDER — DOCUSATE SODIUM 100 MG PO CAPS
100.0000 mg | ORAL_CAPSULE | Freq: Two times a day (BID) | ORAL | Status: DC
  Administered 2014-02-01 – 2014-02-03 (×5): 100 mg via ORAL

## 2014-01-31 MED ORDER — KETAMINE HCL 10 MG/ML IJ SOLN
INTRAMUSCULAR | Status: DC | PRN
  Administered 2014-01-31: 60 mg via INTRAVENOUS

## 2014-01-31 MED ORDER — MIDAZOLAM HCL 5 MG/5ML IJ SOLN
INTRAMUSCULAR | Status: DC | PRN
  Administered 2014-01-31: 1 mg via INTRAVENOUS
  Administered 2014-01-31: 2 mg via INTRAVENOUS
  Administered 2014-01-31: 1 mg via INTRAVENOUS

## 2014-01-31 MED ORDER — ROPIVACAINE HCL 5 MG/ML IJ SOLN
INTRAMUSCULAR | Status: DC | PRN
  Administered 2014-01-31: 20 mL via PERINEURAL

## 2014-01-31 MED ORDER — KETAMINE HCL 10 MG/ML IJ SOLN
INTRAMUSCULAR | Status: AC
  Filled 2014-01-31: qty 1

## 2014-01-31 MED ORDER — ALUM & MAG HYDROXIDE-SIMETH 200-200-20 MG/5ML PO SUSP: 30.0000 mL | ORAL | Status: DC | PRN

## 2014-01-31 MED ORDER — ONDANSETRON HCL 4 MG/2ML IJ SOLN: 4.0000 mg | Freq: Four times a day (QID) | INTRAMUSCULAR | Status: DC | PRN

## 2014-01-31 MED ORDER — HYDROCHLOROTHIAZIDE 25 MG PO TABS
25.0000 mg | ORAL_TABLET | Freq: Every day | ORAL | Status: DC
  Administered 2014-02-02 – 2014-02-03 (×2): 25 mg via ORAL
  Filled 2014-01-31 (×4): qty 1

## 2014-01-31 MED ORDER — DEXAMETHASONE SODIUM PHOSPHATE 10 MG/ML IJ SOLN
10.0000 mg | Freq: Once | INTRAMUSCULAR | Status: AC
  Administered 2014-02-01: 10 mg via INTRAVENOUS
  Filled 2014-01-31: qty 1

## 2014-01-31 MED ORDER — CEFAZOLIN SODIUM-DEXTROSE 2-3 GM-% IV SOLR
2.0000 g | Freq: Four times a day (QID) | INTRAVENOUS | Status: AC
  Administered 2014-01-31 (×2): 2 g via INTRAVENOUS
  Filled 2014-01-31 (×2): qty 50

## 2014-01-31 MED ORDER — HYDROCODONE-ACETAMINOPHEN 7.5-325 MG PO TABS
1.0000 | ORAL_TABLET | ORAL | Status: DC
  Administered 2014-01-31 (×2): 2 via ORAL
  Filled 2014-01-31 (×3): qty 2

## 2014-01-31 MED ORDER — DEXAMETHASONE SODIUM PHOSPHATE 4 MG/ML IJ SOLN
INTRAMUSCULAR | Status: DC | PRN
  Administered 2014-01-31: 10 mg via INTRAVENOUS

## 2014-01-31 MED ORDER — CELECOXIB 200 MG PO CAPS
200.0000 mg | ORAL_CAPSULE | Freq: Two times a day (BID) | ORAL | Status: DC
  Administered 2014-01-31 – 2014-02-03 (×6): 200 mg via ORAL
  Filled 2014-01-31 (×7): qty 1

## 2014-01-31 SURGICAL SUPPLY — 55 items
BAG ZIPLOCK 12X15 (MISCELLANEOUS) ×3 IMPLANT
BANDAGE ELASTIC 6 VELCRO ST LF (GAUZE/BANDAGES/DRESSINGS) ×3 IMPLANT
BANDAGE ESMARK 6X9 LF (GAUZE/BANDAGES/DRESSINGS) ×1 IMPLANT
BLADE SAW SGTL 13.0X1.19X90.0M (BLADE) ×3 IMPLANT
BNDG ESMARK 6X9 LF (GAUZE/BANDAGES/DRESSINGS) ×3
BOWL SMART MIX CTS (DISPOSABLE) ×3 IMPLANT
CAPT RP KNEE ×3 IMPLANT
CEMENT HV SMART SET (Cement) ×6 IMPLANT
CUFF TOURN SGL QUICK 34 (TOURNIQUET CUFF) ×2
CUFF TRNQT CYL 34X4X40X1 (TOURNIQUET CUFF) ×1 IMPLANT
DECANTER SPIKE VIAL GLASS SM (MISCELLANEOUS) ×3 IMPLANT
DERMABOND ADVANCED (GAUZE/BANDAGES/DRESSINGS) ×2
DERMABOND ADVANCED .7 DNX12 (GAUZE/BANDAGES/DRESSINGS) ×1 IMPLANT
DRAPE EXTREMITY T 121X128X90 (DRAPE) ×3 IMPLANT
DRAPE POUCH INSTRU U-SHP 10X18 (DRAPES) ×3 IMPLANT
DRAPE U-SHAPE 47X51 STRL (DRAPES) ×3 IMPLANT
DRSG AQUACEL AG ADV 3.5X10 (GAUZE/BANDAGES/DRESSINGS) ×3 IMPLANT
DRSG TEGADERM 4X4.75 (GAUZE/BANDAGES/DRESSINGS) IMPLANT
DURAPREP 26ML APPLICATOR (WOUND CARE) ×6 IMPLANT
ELECT REM PT RETURN 9FT ADLT (ELECTROSURGICAL) ×3
ELECTRODE REM PT RTRN 9FT ADLT (ELECTROSURGICAL) ×1 IMPLANT
EVACUATOR 1/8 PVC DRAIN (DRAIN) IMPLANT
FACESHIELD LNG OPTICON STERILE (SAFETY) ×15 IMPLANT
GAUZE SPONGE 2X2 8PLY STRL LF (GAUZE/BANDAGES/DRESSINGS) IMPLANT
GLOVE BIOGEL PI IND STRL 7.5 (GLOVE) ×1 IMPLANT
GLOVE BIOGEL PI IND STRL 8 (GLOVE) ×1 IMPLANT
GLOVE BIOGEL PI INDICATOR 7.5 (GLOVE) ×2
GLOVE BIOGEL PI INDICATOR 8 (GLOVE) ×2
GLOVE ECLIPSE 8.0 STRL XLNG CF (GLOVE) ×3 IMPLANT
GLOVE ORTHO TXT STRL SZ7.5 (GLOVE) ×6 IMPLANT
GOWN SPEC L3 XXLG W/TWL (GOWN DISPOSABLE) ×3 IMPLANT
GOWN STRL REUS W/TWL LRG LVL3 (GOWN DISPOSABLE) ×3 IMPLANT
HANDPIECE INTERPULSE COAX TIP (DISPOSABLE) ×4
KIT BASIN OR (CUSTOM PROCEDURE TRAY) ×3 IMPLANT
MANIFOLD NEPTUNE II (INSTRUMENTS) ×3 IMPLANT
NDL SAFETY ECLIPSE 18X1.5 (NEEDLE) ×1 IMPLANT
NEEDLE HYPO 18GX1.5 SHARP (NEEDLE) ×2
NS IRRIG 1000ML POUR BTL (IV SOLUTION) ×3 IMPLANT
PACK TOTAL JOINT (CUSTOM PROCEDURE TRAY) ×3 IMPLANT
POSITIONER SURGICAL ARM (MISCELLANEOUS) ×3 IMPLANT
SET HNDPC FAN SPRY TIP SCT (DISPOSABLE) ×2 IMPLANT
SET PAD KNEE POSITIONER (MISCELLANEOUS) ×3 IMPLANT
SPONGE GAUZE 2X2 STER 10/PKG (GAUZE/BANDAGES/DRESSINGS)
SUCTION FRAZIER 12FR DISP (SUCTIONS) ×3 IMPLANT
SUT MNCRL AB 4-0 PS2 18 (SUTURE) ×3 IMPLANT
SUT VIC AB 1 CT1 36 (SUTURE) ×3 IMPLANT
SUT VIC AB 2-0 CT1 27 (SUTURE) ×6
SUT VIC AB 2-0 CT1 TAPERPNT 27 (SUTURE) ×3 IMPLANT
SUT VLOC 180 0 24IN GS25 (SUTURE) ×3 IMPLANT
SYR 50ML LL SCALE MARK (SYRINGE) ×3 IMPLANT
TOWEL OR 17X26 10 PK STRL BLUE (TOWEL DISPOSABLE) ×3 IMPLANT
TOWEL OR NON WOVEN STRL DISP B (DISPOSABLE) IMPLANT
TRAY FOLEY CATH 14FRSI W/METER (CATHETERS) ×3 IMPLANT
WATER STERILE IRR 1500ML POUR (IV SOLUTION) ×3 IMPLANT
WRAP KNEE MAXI GEL POST OP (GAUZE/BANDAGES/DRESSINGS) ×3 IMPLANT

## 2014-01-31 NOTE — Progress Notes (Signed)
Utilization review completed.  

## 2014-01-31 NOTE — Interval H&P Note (Signed)
History and Physical Interval Note:  01/31/2014 8:57 AM  Aaron Berg  has presented today for surgery, with the diagnosis of LEFT KNEE OA   The various methods of treatment have been discussed with the patient and family. After consideration of risks, benefits and other options for treatment, the patient has consented to  Procedure(s): LEFT TOTAL KNEE ARTHROPLASTY (Left) as a surgical intervention .  The patient's history has been reviewed, patient examined, no change in status, stable for surgery.  I have reviewed the patient's chart and labs.  Questions were answered to the patient's satisfaction.     Mauri Pole

## 2014-01-31 NOTE — Anesthesia Procedure Notes (Signed)
Anesthesia Regional Block:  Femoral nerve block  Pre-Anesthetic Checklist: ,, timeout performed, Correct Patient, Correct Site, Correct Laterality, Correct Procedure, Correct Position, site marked, Risks and benefits discussed,  Surgical consent,  Pre-op evaluation,  At surgeon's request and post-op pain management  Laterality: Lower and Left  Prep: chloraprep       Needles:  Injection technique: Single-shot  Needle Type: Stimiplex     Needle Length: 10cm 10 cm Needle Gauge: 21 and 21 G    Additional Needles:  Procedures: ultrasound guided (picture in chart) and nerve stimulator Femoral nerve block Narrative:  Injection made incrementally with aspirations every 5 mL.  Performed by: Personally  Anesthesiologist: Montez Hageman MD  Additional Notes: Patient tolerated the procedure well without complications

## 2014-01-31 NOTE — Anesthesia Postprocedure Evaluation (Signed)
  Anesthesia Post-op Note  Patient: Aaron Berg  Procedure(s) Performed: Procedure(s) (LRB): LEFT TOTAL KNEE ARTHROPLASTY (Left)  Patient Location: PACU  Anesthesia Type: Spinal  Level of Consciousness: awake and alert   Airway and Oxygen Therapy: Patient Spontanous Breathing  Post-op Pain: mild  Post-op Assessment: Post-op Vital signs reviewed, Patient's Cardiovascular Status Stable, Respiratory Function Stable, Patent Airway and No signs of Nausea or vomiting  Last Vitals:  Filed Vitals:   01/31/14 1505  BP: 101/65  Pulse: 62  Temp: 36.3 C  Resp: 16    Post-op Vital Signs: stable   Complications: No apparent anesthesia complications

## 2014-01-31 NOTE — Evaluation (Signed)
Physical Therapy Evaluation Patient Details Name: Aaron Berg MRN: [id number] DOB: [birth year] Today's Date: 01/31/2014 Time: 8756-4332 PT Time Calculation (min): 29 min  PT Assessment / Plan / Recommendation History of Present Illness  s/p L TKA, recent R TKA 01/02/14  Clinical Impression  *Pt is s/p TKA resulting in the deficits listed below (see PT Problem List). * Pt will benefit from skilled PT to increase their independence and safety with mobility to allow discharge to the venue listed below.  **    PT Assessment  Patient needs continued PT services    Follow Up Recommendations  SNF    Does the patient have the potential to tolerate intense rehabilitation      Barriers to Discharge        Equipment Recommendations  None recommended by PT    Recommendations for Other Services     Frequency 7X/week    Precautions / Restrictions Precautions Precautions: Fall Restrictions Weight Bearing Restrictions: No Other Position/Activity Restrictions: WBAT   Pertinent Vitals/Pain *Pt reports no pain in knee Ice applied**      Mobility  Transfers Overall transfer level: Needs assistance Equipment used: Rolling walker (2 wheeled) Transfers: Sit to/from Stand Sit to Stand: Mod assist General transfer comment: MOd A  from commode and from [facility], VCs for hand placement Ambulation/Gait Ambulation Distance (Feet): 28 Feet (14' x 2) Assistive device: Rolling walker (2 wheeled) Gait Pattern/deviations: Step-to pattern Gait velocity interpretation: Below normal speed for age/gender General Gait Details: VCs sequencing    Exercises Total Joint Exercises Ankle Circles/Pumps: AROM;Both;10 reps;Supine Quad Sets: AROM;Both;10 reps;Supine Heel Slides: AAROM;Left;10 reps;Supine Goniometric ROM: 95* flexion AAROM L knee   PT Diagnosis: Difficulty walking;Acute pain  PT Problem List: Decreased range of motion;Decreased strength;Decreased activity tolerance;Decreased  mobility;Pain PT Treatment Interventions: Gait training;DME instruction;Therapeutic activities;Therapeutic exercise;Patient/family education     PT Goals(Current goals can be found in the care plan section) Acute Rehab PT Goals Patient Stated Goal: return to [facility] PT Goal Formulation: With patient/family Time For Goal Achievement: 02/07/14 Potential to Achieve Goals: Good  Visit Information  Last PT Received On: 01/31/14 Assistance Needed: +1 History of Present Illness: s/p L TKA, recent R TKA 01/02/14       Prior [place] expects to be discharged to:: Skilled nursing facility Living Arrangements: Alone Prior Function Level of Independence: Independent with assistive device(s) Comments: pt admitted from [address], he [place] there after R TKA 01/02/14 Communication Communication: No difficulties    Cognition  Cognition Arousal/Alertness: Awake/alert Behavior During Therapy: WFL for tasks assessed/performed Overall Cognitive Status: Within Functional Limits for tasks assessed    Extremity/Trunk Assessment Upper Extremity Assessment Upper Extremity Assessment: Overall WFL for tasks assessed Lower Extremity Assessment Lower Extremity Assessment: LLE deficits/detail;RLE deficits/detail RLE Deficits / Details: knee ext AROM -15*, knee ext strength +4/5 LLE Deficits / Details: knee flexion AAROM 95*, knee extension 2/5, ankle WNL Cervical / Trunk Assessment Cervical / Trunk Assessment: Normal   Balance    End of Session PT - End of Session Equipment Utilized During Treatment: Gait belt Activity Tolerance: Patient tolerated treatment well Patient left: in chair;with call bell/phone within reach;with family/visitor present Nurse Communication: Mobility status  GP     Aaron Berg 01/31/2014, 4:21 PM [phone]

## 2014-01-31 NOTE — Progress Notes (Signed)
MD paged about low BP.   Alvan Dame returned page at [phone].  MD informed RN to hold next dose of blood pressure medications, but to not give a bolus at this time due to patients congestive heart failure.   MD/PA and RN will continue to monitor, and will make decisions in the AM based on patient vital signs, and other labs.

## 2014-01-31 NOTE — Transfer of Care (Signed)
Immediate Anesthesia Transfer of Care Note  Patient: Aaron Berg  Procedure(s) Performed: Procedure(s): LEFT TOTAL KNEE ARTHROPLASTY (Left)  Patient Location: PACU  Anesthesia Type:General  Level of Consciousness: Patient easily awoken, sedated, comfortable, cooperative, following commands, responds to stimulation.   Airway & Oxygen Therapy: Patient spontaneously breathing, ventilating well, oxygen via simple oxygen mask.  Post-op Assessment: Report given to PACU RN, vital signs reviewed and stable, moving all extremities.   Post vital signs: Reviewed and stable.  Complications: No apparent anesthesia complications

## 2014-01-31 NOTE — Anesthesia Preprocedure Evaluation (Addendum)
Anesthesia Evaluation  Patient identified by MRN, date of birth, ID band Patient awake    Reviewed: Allergy & Precautions, H&P , NPO status , Patient's Chart, lab work & pertinent test results  History of Anesthesia Complications (+) Emergence Delirium  Airway Mallampati: II TM Distance: >3 FB Neck ROM: Full    Dental no notable dental hx.    Pulmonary COPDformer smoker,  breath sounds clear to auscultation  Pulmonary exam normal       Cardiovascular hypertension, Pt. on medications and Pt. on home beta blockers + CAD, + CABG (2003) and +CHF + dysrhythmias Atrial Fibrillation Rhythm:Regular Rate:Normal     Neuro/Psych negative neurological ROS  negative psych ROS   GI/Hepatic negative GI ROS, (+)     substance abuse  alcohol use,   Endo/Other  negative endocrine ROS  Renal/GU negative Renal ROS  negative genitourinary   Musculoskeletal negative musculoskeletal ROS (+)   Abdominal   Peds negative pediatric ROS (+)  Hematology negative hematology ROS (+)   Anesthesia Other Findings   Reproductive/Obstetrics negative OB ROS                         Anesthesia Physical  Anesthesia Plan  ASA: III  Anesthesia Plan: General   Post-op Pain Management:    Induction: Intravenous  Airway Management Planned: LMA and Oral ETT  Additional Equipment:   Intra-op Plan:   Post-operative Plan:   Informed Consent: I have reviewed the patients History and Physical, chart, labs and discussed the procedure including the risks, benefits and alternatives for the proposed anesthesia with the patient or authorized representative who has indicated his/her understanding and acceptance.   Dental advisory given  Plan Discussed with: CRNA  Anesthesia Plan Comments: (Pt not a candidate for SAB. Only off Plavix for 5 days. Patient elects to proceed under GA with FNB. Moderate to high risk for  complications.)       Anesthesia Quick Evaluation

## 2014-01-31 NOTE — Op Note (Signed)
NAME:  LINZY DARLING                      MEDICAL RECORD NO.:  [id number]                             FACILITY:  [facility]      PHYSICIAN:  Pietro Cassis. Alvan Dame, M.D.  DATE OF BIRTH:  1936/07/18      DATE OF PROCEDURE:  01/31/2014                                     OPERATIVE REPORT         PREOPERATIVE DIAGNOSIS:  Left knee osteoarthritis.      POSTOPERATIVE DIAGNOSIS:  Left knee osteoarthritis with history of recent right knee replacement     FINDINGS:  The patient was noted to have complete loss of cartilage and   bone-on-bone arthritis with associated osteophytes in all three compartments of   the knee with a significant synovitis and associated effusion.      PROCEDURE:  Left total knee replacement.      COMPONENTS USED:  DePuy rotating platform posterior stabilized knee   system, a size 5 femur, 5 tibia, 10 mm PS insert, and 41 patellar   button.      SURGEON:  Pietro Cassis. Alvan Dame, M.D.      ASSISTANT:  Danae Orleans, PA-C.      ANESTHESIA:  General and Regional.      SPECIMENS:  None.      COMPLICATION:  None.      DRAINS: None.  EBL: about 150cc      TOURNIQUET TIME:   Total Tourniquet Time Documented: Thigh (Left) - 46 minutes Total: Thigh (Left) - 46 minutes  .      The patient was stable to the recovery room.      INDICATION FOR PROCEDURE:  ARISTON GRANDISON is a 78 y.o. male patient of   mine.  The patient had been seen, evaluated, and treated conservatively in the   office with medication, activity modification, and injections.  The patient had   radiographic changes of bone-on-bone arthritis with endplate sclerosis and osteophytes noted.      The patient failed conservative measures including medication, injections, and activity modification, and at this point was ready for more definitive measures.   Based on the radiographic changes and failed conservative measures, the patient   decided to proceed with total knee replacement.  Risks of infection,   DVT,  component failure, need for revision surgery, postop course, and   expectations were all   discussed and reviewed.  Consent was obtained for benefit of pain   relief.      PROCEDURE IN DETAIL:  The patient was brought to the operative theater.   Once adequate anesthesia, preoperative antibiotics, 2 gm of Ancef administered, the patient was positioned supine with the left thigh tourniquet placed.  The  left lower extremity was prepped and draped in sterile fashion.  A time-   out was performed identifying the patient, planned procedure, and   extremity.      The left lower extremity was placed in the [facility] leg holder.  The leg was   exsanguinated, tourniquet elevated to 250 mmHg.  A midline incision was   made followed by median parapatellar arthrotomy.  Following initial  exposure, attention was first directed to the patella.  Precut   measurement was noted to be 26 mm.  I resected down to 15 mm and used a   41 patellar button to restore patellar height as well as cover the cut   surface.      The lug holes were drilled and a metal shim was placed to protect the   patella from retractors and saw blades.      At this point, attention was now directed to the femur.  The femoral   canal was opened with a drill, irrigated to try to prevent fat emboli.  An   intramedullary rod was passed at 5 degrees valgus, 10 mm of bone was   resected off the distal femur.  Following this resection, the tibia was   subluxated anteriorly.  Using the extramedullary guide, 6-8 mm of bone was resected off   the proximal lateral tibia.  We confirmed the gap would be   stable medially and laterally with a 10 mm insert as well as confirmed   the cut was perpendicular in the coronal plane, checking with an alignment rod.      Once this was done, I sized the femur to be a size 5 in the anterior-   posterior dimension, chose a standard component based on medial and   lateral dimension.  The size 5 rotation  block was then pinned in   position anterior referenced using the C-clamp to set rotation.  The   anterior, posterior, and  chamfer cuts were made without difficulty nor   notching making certain that I was along the anterior cortex to help   with flexion gap stability.      The final box cut was made off the lateral aspect of distal femur.      At this point, the tibia was sized to be a size 5, the size 5 tray was   then pinned in position through the medial third of the tubercle,   drilled, and keel punched.  Trial reduction was now carried with a 5 femur,  5 tibia, a 10 mm insert, and the 41 patella botton.  The knee was brought to   extension, full extension with good flexion stability with the patella   tracking through the trochlea without application of pressure.  Given   all these findings, the trial components removed.  Final components were   opened and cement was mixed.  The knee was irrigated with normal saline   solution and pulse lavage.  The synovial lining was   then injected with 20cc of Exparel, 30cc of 0.25% Marcaine with epinephrine and 1 cc of Toradol,   total of 61 cc.      The knee was irrigated.  Final implants were then cemented onto clean and   dried cut surfaces of bone with the knee brought to extension with a 10 mm trial insert.      Once the cement had fully cured, the excess cement was removed   throughout the knee.  I confirmed I was satisfied with the range of   motion and stability, and the final 10 mm PS insert was chosen.  It was   placed into the knee.      The tourniquet had been let down at 46 minutes.  No significant   hemostasis required.  The   extensor mechanism was then reapproximated using #1 Vicryl and #0 V-lock sutures with the knee   in  flexion.  The   remaining wound was closed with 2-0 Vicryl and running 4-0 Monocryl.   The knee was cleaned, dried, dressed sterilely using Dermabond and   Aquacel dressing.  The patient was then    brought to recovery room in stable condition, tolerating the procedure   well.   Please note that Physician Assistant, Danae Orleans, PA-C, was present for the entirety of the case, and was utilized for pre-operative positioning, peri-operative retractor management, general facilitation of the procedure.  He was also utilized for primary wound closure at the end of the case.              Pietro Cassis Alvan Dame, M.D.    01/31/2014 11:51 AM

## 2014-01-31 NOTE — Preoperative (Signed)
Beta Blockers   Reason not to administer Beta Blockers:Not Applicable, took BB this am 

## 2014-02-01 LAB — BASIC METABOLIC PANEL
BUN: 36 mg/dL — ABNORMAL HIGH (ref 6–23)
CO2: 23 mEq/L (ref 19–32)
Calcium: 9.4 mg/dL (ref 8.4–10.5)
Chloride: 101 mEq/L (ref 96–112)
Creatinine, Ser: 1.72 mg/dL — ABNORMAL HIGH (ref 0.50–1.35)
GFR calc non Af Amer: 37 mL/min — ABNORMAL LOW (ref >90)
GFR, EST AFRICAN AMERICAN: 43 mL/min — AB (ref >90)
Glucose, Bld: 128 mg/dL — ABNORMAL HIGH (ref 70–99)
POTASSIUM: 4.3 meq/L (ref 3.7–5.3)
SODIUM: 137 meq/L (ref 137–147)

## 2014-02-01 LAB — CBC
HCT: 27.4 % — ABNORMAL LOW (ref 39.0–52.0)
HEMOGLOBIN: 8.9 g/dL — AB (ref 13.0–17.0)
MCH: 30.3 pg (ref 26.0–34.0)
MCHC: 32.5 g/dL (ref 30.0–36.0)
MCV: 93.2 fL (ref 78.0–100.0)
PLATELETS: 176 10*3/uL (ref 150–400)
RBC: 2.94 MIL/uL — ABNORMAL LOW (ref 4.22–5.81)
RDW: 13.8 % (ref 11.5–15.5)
WBC: 11 10*3/uL — ABNORMAL HIGH (ref 4.0–10.5)

## 2014-02-01 MED ORDER — ENSURE COMPLETE PO LIQD
237.0000 mL | Freq: Two times a day (BID) | ORAL | Status: DC
  Administered 2014-02-01 – 2014-02-03 (×4): 237 mL via ORAL

## 2014-02-01 MED ORDER — TRAMADOL HCL 50 MG PO TABS
50.0000 mg | ORAL_TABLET | Freq: Four times a day (QID) | ORAL | Status: DC | PRN
  Administered 2014-02-01: 50 mg via ORAL
  Administered 2014-02-02: 100 mg via ORAL
  Administered 2014-02-02: 50 mg via ORAL
  Administered 2014-02-03: 100 mg via ORAL
  Filled 2014-02-01 (×2): qty 2
  Filled 2014-02-01: qty 1
  Filled 2014-02-01: qty 2

## 2014-02-01 NOTE — Progress Notes (Signed)
Patient placed self on his home CPAP. RT will continue to monitor.

## 2014-02-01 NOTE — Progress Notes (Signed)
INITIAL NUTRITION ASSESSMENT  Pt meets criteria for severe MALNUTRITION in the context of chronic illness as evidenced by <75% estimated energy intake with 9.7% weight loss in the past month.  DOCUMENTATION CODES Per approved criteria  -Obesity Unspecified -Severe malnutrition in the context of chronic illness   INTERVENTION: - Ensure Complete BID - Encouraged high calorie/protein diet to build strength - Will continue to monitor   NUTRITION DIAGNOSIS: Unintended weight loss related to nausea, poor appetite as evidenced by pt report.   Goal: 1. No return of nausea 2. Pt to consume >90% of meals/supplements  Monitor:  Weights, labs, intake, nausea  Reason for Assessment: Malnutrition screening tool   78 y.o. male  Admitting Dx: S/P knee replacement  ASSESSMENT: Admitted with end stage arthritis of the left knee, had replacement yesterday. Met with pt and daughter who report pt has had nausea for the past month with pt consuming around 25% of his meals with 27 pound unintended weight loss during this time frame. Pt not on any nutritional supplements PTA. Pt eating well today with returned appetite and no nausea. Pt working on breakfast during visit this morning.   Height: Ht Readings from Last 1 Encounters:  01/31/14 _0  (1.753 m)    Weight: Wt Readings from Last 1 Encounters:  01/31/14 251 lb 12.3 oz (114.2 kg)    Ideal Body Weight: 160 lb   % Ideal Body Weight: 157%  Wt Readings from Last 10 Encounters:  01/31/14 251 lb 12.3 oz (114.2 kg)  01/31/14 251 lb 12.3 oz (114.2 kg)  01/25/14 250 lb (113.399 kg)  01/02/14 277 lb 12.5 oz (126 kg)  01/02/14 277 lb 12.5 oz (126 kg)  12/26/13 260 lb (117.935 kg)  10/11/13 247 lb (112.038 kg)  10/04/13 258 lb (117.028 kg)  01/10/13 262 lb (118.842 kg)  01/12/12 264 lb (119.75 kg)    Usual Body Weight: 278 lb   % Usual Body Weight: 90%  BMI:  Body mass index is 37.16 kg/(m^2). Class II obesity   Estimated  Nutritional Needs: Kcal: 1850-2050 Protein: 90-110g Fluid: 1.8-2L/day  Skin: Non-pitting LLE edema  Diet Order: General  EDUCATION NEEDS: -No education needs identified at this time   Intake/Output Summary (Last 24 hours) at 02/01/14 1042 Last data filed at 02/01/14 1021  Gross per 24 hour  Intake 4325.41 ml  Output    810 ml  Net 3515.41 ml    Last BM: 2/17  Labs:   Recent Labs Lab 01/25/14 1450 02/01/14 0410  NA 138 137  K 4.0 4.3  CL 99 101  CO2 27 23  BUN 26* 36*  CREATININE 1.24 1.72*  CALCIUM 10.4 9.4  GLUCOSE 109* 128*    CBG (last 3)  No results found for this basename: GLUCAP,  in the last 72 hours  Scheduled Meds: . aspirin EC  325 mg Oral BID  . atorvastatin  80 mg Oral q1800  . celecoxib  200 mg Oral Q12H  . clopidogrel  75 mg Oral Q breakfast  . docusate sodium  100 mg Oral BID  . ferrous sulfate  325 mg Oral TID PC  . hydrochlorothiazide  25 mg Oral Q breakfast  . HYDROcodone-acetaminophen  1-2 tablet Oral Q4H  . metoprolol succinate  100 mg Oral Q breakfast  . polyethylene glycol  17 g Oral BID    Continuous Infusions: . sodium chloride 0.9 % 1,000 mL with potassium chloride 10 mEq infusion 100 mL/hr at 02/01/14 5284  Past Medical History  Diagnosis Date  . ASHD (arteriosclerotic heart disease)   . Dyslipidemia     well controlled  . Obesity   . Arthritis   . Male hypogonadism   . Gait disorder   . Hypertension   . Dysrhythmia 2003    AFib after CABG; warfarin x 6 months  . Sleep apnea     cpap  . CHF (congestive heart failure)   . Dry eyes   . Complication of anesthesia     "woke up too soon"  . Depression   . COPD (chronic obstructive pulmonary disease)     "no signs or tests"  . Headache(784.0)     silent migraines- no pain just go blind for just a minute  . Cancer     skin  . Memory loss, short term 2/15    per daughter since last surgery and placement at [facility].  "Hallucinations have stopped"    Past  Surgical History  Procedure Laterality Date  . Lumbar cyst      Dr. Joya Salm  . Shoulder surgery      Right with murphy, reattach ligaments  . Coronary artery bypass graft  2003    x 4  . Video assisted thoracoscopy (vats)/thorocotomy  2002    benign nodule  . Joint replacement      right shoulder replaced  . Coronary angioplasty  1991    with stents placed  . Eye surgery Bilateral     cataract surgeries  . Eye surgery Right     detached retina  . Tonsillectomy  as child  . Appendectomy  as child  . Mohs surgery      on chest  . Total knee arthroplasty Right 01/02/2014    Procedure: RIGHT TOTAL KNEE ARTHROPLASTY;  Surgeon: Mauri Pole, MD;  Location: [facility];  Service: Orthopedics;  Laterality: Right;  . Total knee arthroplasty Left 01/31/2014    Procedure: LEFT TOTAL KNEE ARTHROPLASTY;  Surgeon: Mauri Pole, MD;  Location: [facility];  Service: Orthopedics;  Laterality: Left;    Aaron College MS, [place], [place] Pager [phone] After Hours Pager

## 2014-02-01 NOTE — Progress Notes (Signed)
02/01/14 1500  PT Visit Information  Last PT Received On 02/01/14  Assistance Needed +1  PT Time Calculation  PT Start Time 1459  PT Stop Time 1527  PT Time Calculation (min) 28 min  Subjective Data  Patient Stated Goal return to [facility]  Precautions  Precautions Fall  Restrictions  Other Position/Activity Restrictions WBAT  Cognition  Arousal/Alertness Awake/alert  Behavior During Therapy WFL for tasks assessed/performed  Overall Cognitive Status Within Functional Limits for tasks assessed  Bed Mobility  Overal bed mobility Needs Assistance  Bed Mobility Sit to Supine  Sit to supine Min assist  General bed mobility comments assist with LLE  Transfers  Overall transfer level Needs assistance  Equipment used Rolling walker (2 wheeled)  Transfers Sit to/from Stand  Sit to Stand Min assist;Min guard  General transfer comment VCs hand placement, min A to steady  Ambulation/Gait  Ambulation/Gait assistance Min guard  Ambulation Distance (Feet) 98 Feet  Assistive device Rolling walker (2 wheeled)  Gait Pattern/deviations Step-to pattern  General Gait Details VCs sequencing  Total Joint Exercises  Ankle Circles/Pumps AROM;Both;10 reps;Supine  Quad Sets AROM;Both;10 reps;Supine  Short Arc Quad AAROM;Left;10 reps;Supine  Heel Slides AAROM;Left;10 reps;Supine  PT - End of Session  Equipment Utilized During Treatment Gait belt  Activity Tolerance Patient tolerated treatment well  Patient left in bed;with call bell/phone within reach  Nurse Communication Mobility status  PT - Assessment/Plan  PT Plan Current plan remains appropriate  PT Frequency 7X/week  Follow Up Recommendations SNF  PT equipment None recommended by PT  PT Goal Progression  Progress towards PT goals Progressing toward goals  Acute Rehab PT Goals  Time For Goal Achievement 02/07/14  Potential to Achieve Goals Good  PT General Charges  $$ ACUTE PT VISIT 1 Procedure  PT Treatments  $Gait Training 8-22  mins  $Therapeutic Exercise 8-22 mins

## 2014-02-01 NOTE — Progress Notes (Signed)
Clinical Social Work Department BRIEF PSYCHOSOCIAL ASSESSMENT 02/01/2014  Patient:  Aaron Berg, Aaron Berg     Account Number:  [id number]     Admit date:  01/31/2014  Clinical Social Worker:  Lacie Scotts  Date/Time:  02/01/2014 09:00 AM  Referred by:  Physician  Date Referred:  02/01/2014 Referred for  SNF Placement   Other Referral:   Interview type:  Patient Other interview type:    PSYCHOSOCIAL DATA Living Status:  FACILITY Admitted from facility:  [facility] Level of care:  [place] Primary support name:  [name] Primary support relationship to patient:  CHILD, ADULT Degree of support available:   supportive    CURRENT CONCERNS Current Concerns  Post-Acute Placement   Other Concerns:    SOCIAL WORK ASSESSMENT / PLAN Pt is a 78 yr old gentleman admitted from [facility]. CSW met with pt /daughter to assist with d/c planning. Pt plans to return to [facility] following hospital d/c. CSW has contacted SNF and d/c plans have been confirmed. CSW will continue to follow to assist with d/c planning to SNF   Assessment/plan status:  Psychosocial Support/Ongoing Assessment of Needs Other assessment/ plan:   Information/referral to community resources:   None needed at this time.    PATIENT'S/FAMILY'S RESPONSE TO PLAN OF CARE: Pt is calm, pleasant and tolerating PT well. He has had a good experience at [facility] and is looking forward to returning. Pt is motivated to return to independent living.    Werner Lean LCSW [phone]

## 2014-02-01 NOTE — Progress Notes (Signed)
OT Cancellation Note  Patient Details Name: Aaron Berg MRN: [id number] DOB: 11/28/1936   Cancelled Treatment:    Reason Eval/Treat Not Completed: Other (comment) Defer OT eval to SNF.  Jules Schick [phone] 02/01/2014, 10:46 AM

## 2014-02-01 NOTE — Progress Notes (Signed)
Writer found pt out of bed, CPAP off, Ace Wrap, TED'S, PAS, ICE also off and thrown in floor. Stated he was trying to get "Vaseline" so he could get catheter out. Reoriented and assisted back to bed. Will give only 1 Vicodin from this point forward.

## 2014-02-01 NOTE — Progress Notes (Signed)
Pt reports he "understood I was to go back on Plavix immediately after surgery" Will call md.

## 2014-02-01 NOTE — Progress Notes (Signed)
   Subjective: 1 Day Post-Op Procedure(s) (LRB): LEFT TOTAL KNEE ARTHROPLASTY (Left)   Patient reports pain as mild, pain controlled. Some confusion last night, which has occurred previously.  Was a little confused on where he was when he first woke up, however he did know who his daughter was when she walked in the room.   Objective:   VITALS:   Filed Vitals:   02/01/14 0649  BP: 94/54  Pulse: 65  Temp: 98.5 F (36.9 C)  Resp: 16    Neurovascular intact Dorsiflexion/Plantar flexion intact Incision: dressing C/D/I No cellulitis present Compartment soft  LABS  Recent Labs  02/01/14 0410  HGB 8.9*  HCT 27.4*  WBC 11.0*  PLT 176     Recent Labs  02/01/14 0410  NA 137  K 4.3  BUN 36*  CREATININE 1.72*  GLUCOSE 128*     Assessment/Plan: 1 Day Post-Op Procedure(s) (LRB): LEFT TOTAL KNEE ARTHROPLASTY (Left) Foley cath d/c'ed Will decrease and change the frequency of his pain medication Advance diet Up with therapy D/C IV fluids Discharge to SNF eventually, when ready  Expected ABLA  Treated with iron and will observe  Obese (BMI 30-39.9) Estimated body mass index is 37.16 kg/(m^2) as calculated from the following:   Height as of this encounter: 5\' 9"  (1.753 m).   Weight as of this encounter: 114.2 kg (251 lb 12.3 oz). Patient also counseled that weight may inhibit the healing process Patient counseled that losing weight will help with future health issues     West Pugh. Delle Andrzejewski   PAC  02/01/2014, 8:28 AM

## 2014-02-01 NOTE — Progress Notes (Signed)
Physical Therapy Treatment Patient Details Name: SHANTE MAYSONET MRN: [id number] DOB: 01-07-1936 Today's Date: 02/01/2014 Time: 8676-7209 PT Time Calculation (min): 36 min  PT Assessment / Plan / Recommendation  History of Present Illness s/p L TKA, recent R TKA 01/02/14   PT Comments   **Pt is progressing well with mobility, he walked 150' with RW. Excellent flexion ROM of R knee at 95*.  *  Follow Up Recommendations  SNF     Does the patient have the potential to tolerate intense rehabilitation     Barriers to Discharge        Equipment Recommendations  None recommended by PT    Recommendations for Other Services    Frequency 7X/week   Progress towards PT Goals Progress towards PT goals: Progressing toward goals  Plan Current plan remains appropriate    Precautions / Restrictions Precautions Precautions: Fall Restrictions Weight Bearing Restrictions: No Other Position/Activity Restrictions: WBAT   Pertinent Vitals/Pain *6/10 L knee Premedicated, ice applied, BLEs elevated**    Mobility  Transfers Overall transfer level: Needs assistance Equipment used: Rolling walker (2 wheeled) Transfers: Sit to/from Stand Sit to Stand: Min assist General transfer comment: VCs hand placement, min A to steady Ambulation/Gait Ambulation/Gait assistance: Supervision Ambulation Distance (Feet): 150 Feet Assistive device: Rolling walker (2 wheeled) Gait Pattern/deviations: Step-to pattern;Decreased weight shift to left;Antalgic Gait velocity interpretation: Below normal speed for age/gender General Gait Details: VCs sequencing    Exercises Total Joint Exercises Ankle Circles/Pumps: AROM;Both;10 reps;Supine Quad Sets: AROM;Both;10 reps;Supine Short Arc Quad: AAROM;Left;10 reps;Supine Heel Slides: AAROM;Left;10 reps;Supine Hip ABduction/ADduction: AAROM;Left;10 reps;Supine Straight Leg Raises: AAROM;Left;10 reps;Supine Long Arc Quad: AAROM;Left;5 reps;Seated Goniometric ROM:  95* L knee flexion AAROM   PT Diagnosis:    PT Problem List:   PT Treatment Interventions:     PT Goals (current goals can now be found in the care plan section) Acute Rehab PT Goals Patient Stated Goal: return to [facility] PT Goal Formulation: With patient Time For Goal Achievement: 02/07/14 Potential to Achieve Goals: Good  Visit Information  Last PT Received On: 02/01/14 Assistance Needed: +1 History of Present Illness: s/p L TKA, recent R TKA 01/02/14    Subjective Data  Patient Stated Goal: return to [facility]: Awake/alert Behavior During Therapy: WFL for tasks assessed/performed Overall Cognitive Status: Within Functional Limits for tasks assessed    Balance     End of Session PT - End of Session Equipment Utilized During Treatment: Gait belt Activity Tolerance: Patient tolerated treatment well Patient left: in chair;with call bell/phone within reach Nurse Communication: Mobility status   GP     [name] 02/01/2014, 11:20 AM [phone]

## 2014-02-01 NOTE — Progress Notes (Signed)
RN called RT to let RT know that patient brought his home CPAP machine, tubing, and mask. RT assessed machine for any kind of damage to the cord and nothing was found. RT placed patient on machine and patient is tolerating well. RT Will continue to monitor.

## 2014-02-01 NOTE — Care Management Note (Signed)
    Page 1 of 1   02/01/2014     1:58:35 PM   CARE MANAGEMENT NOTE 02/01/2014  Patient:  ITALO, BANTON   Account Number:  [id number]  Date Initiated:  02/01/2014  Documentation initiated by:  Sherrin Daisy  Subjective/Objective Assessment:   dx left knee arthroplasty     Action/Plan:   Pt is from [facility] and plans to return to [facility] for rehab.   Anticipated DC Date:  02/03/2014   Anticipated DC Plan:  SKILLED NURSING FACILITY  In-house referral  Clinical Social Worker      DC Planning Services  CM consult      Choice offered to / List presented to:             Status of service:  Completed, signed off Medicare Important Message given?  NA - LOS <3 / Initial given by admissions (If response is "NO", the following Medicare IM given date fields will be blank) Date Medicare IM given:   Date Additional Medicare IM given:    Discharge Disposition:    Per UR Regulation:    If discussed at Long Length of Stay Meetings, dates discussed:    Comments:

## 2014-02-02 LAB — BASIC METABOLIC PANEL
BUN: 28 mg/dL — ABNORMAL HIGH (ref 6–23)
CO2: 23 mEq/L (ref 19–32)
Calcium: 9.2 mg/dL (ref 8.4–10.5)
Chloride: 104 mEq/L (ref 96–112)
Creatinine, Ser: 1.08 mg/dL (ref 0.50–1.35)
GFR calc Af Amer: 76 mL/min — ABNORMAL LOW (ref >90)
GFR calc non Af Amer: 65 mL/min — ABNORMAL LOW (ref >90)
GLUCOSE: 114 mg/dL — AB (ref 70–99)
POTASSIUM: 4.2 meq/L (ref 3.7–5.3)
Sodium: 141 mEq/L (ref 137–147)

## 2014-02-02 LAB — CBC
HCT: 25.3 % — ABNORMAL LOW (ref 39.0–52.0)
HEMOGLOBIN: 8.3 g/dL — AB (ref 13.0–17.0)
MCH: 30.4 pg (ref 26.0–34.0)
MCHC: 32.8 g/dL (ref 30.0–36.0)
MCV: 92.7 fL (ref 78.0–100.0)
PLATELETS: 134 10*3/uL — AB (ref 150–400)
RBC: 2.73 MIL/uL — ABNORMAL LOW (ref 4.22–5.81)
RDW: 13.9 % (ref 11.5–15.5)
WBC: 9.7 10*3/uL (ref 4.0–10.5)

## 2014-02-02 NOTE — Progress Notes (Signed)
Pt stated that he did not need any assistance with his CPAP tonight and will self administer when ready.  Pt to notify RT if assistance is required.  RT to monitor and assess as needed.

## 2014-02-02 NOTE — Progress Notes (Signed)
   Subjective: 2 Days Post-Op Procedure(s) (LRB): LEFT TOTAL KNEE ARTHROPLASTY (Left)   Patient reports pain as mild, pain controlled with tramadol.  Appears confusion is getting better.  He addressed me by name when I entered the room and knew where he was this morning. No events throughout the night.   Objective:   VITALS:   Filed Vitals:   02/02/14 0459  BP: 115/72  Pulse: 86  Temp: 97.7 F (36.5 C)  Resp: 16    Neurovascular intact Dorsiflexion/Plantar flexion intact Incision: dressing C/D/I No cellulitis present Compartment soft  LABS  Recent Labs  02/01/14 0410 02/02/14 0421  HGB 8.9* 8.3*  HCT 27.4* 25.3*  WBC 11.0* 9.7  PLT 176 134*     Recent Labs  02/01/14 0410 02/02/14 0421  NA 137 141  K 4.3 4.2  BUN 36* 28*  CREATININE 1.72* 1.08  GLUCOSE 128* 114*     Assessment/Plan: 2 Days Post-Op Procedure(s) (LRB): LEFT TOTAL KNEE ARTHROPLASTY (Left) Up with therapy Discharge to SNF eventually, when ready  Expected ABLA  Treated with iron and will observe   Obese (BMI 30-39.9)  Estimated body mass index is 37.16 kg/(m^2) as calculated from the following:      Height as of this encounter: 5\' 9"  (1.753 m).      Weight as of this encounter: 114.2 kg (251 lb 12.3 oz).  Patient also counseled that weight may inhibit the healing process  Patient counseled that losing weight will help with future health issues        West Pugh. Trust Crago   PAC  02/02/2014, 10:05 AM

## 2014-02-02 NOTE — Progress Notes (Signed)
02/02/14 1200  PT Visit Information  Last PT Received On 02/02/14  Assistance Needed +1  History of Present Illness s/p L TKA, recent R TKA 01/02/14  PT Time Calculation  PT Start Time 1118  PT Stop Time 1140  PT Time Calculation (min) 22 min  Subjective Data  Patient Stated Goal return to [facility]  Precautions  Precautions Fall  Restrictions  Weight Bearing Restrictions No  Other Position/Activity Restrictions WBAT  Cognition  Arousal/Alertness Awake/alert  Behavior During Therapy WFL for tasks assessed/performed  Overall Cognitive Status Impaired/Different from baseline  Area of Impairment Memory  Memory Decreased short-term memory  General Comments pt with some intermittent confusion, did not remeber having PT yesterday  Bed Mobility  Overal bed mobility Needs Assistance  Bed Mobility Supine to Sit;Sit to Supine  Supine to sit Min assist  Sit to supine Min assist  General bed mobility comments assist with LLE  Transfers  Overall transfer level Needs assistance  Equipment used Rolling walker (2 wheeled)  Transfers Sit to/from Stand  Sit to Stand Min assist;Min guard  General transfer comment VCs hand placement, min A to steady  Ambulation/Gait  Ambulation/Gait assistance Min guard  Ambulation Distance (Feet) 150 Feet  Assistive device Rolling walker (2 wheeled)  Gait Pattern/deviations Step-to pattern;Step-through pattern;Antalgic  General Gait Details VCs for RW safety, stride length, posture  Total Joint Exercises  Ankle Circles/Pumps AROM;Both;10 reps;Supine  Quad Sets AROM;Both;10 reps;Supine  Heel Slides AAROM;Left;10 reps;Supine  Straight Leg Raises AAROM;Left;10 reps;Supine  Goniometric ROM 110*  PT - End of Session  Equipment Utilized During Treatment Gait belt  Activity Tolerance Patient tolerated treatment well  Patient left in bed;with call bell/phone within reach;with nursing/sitter in room  Nurse Communication Mobility status  PT - Assessment/Plan   PT Plan Current plan remains appropriate  PT Frequency 7X/week  Follow Up Recommendations SNF  PT equipment None recommended by PT  PT Goal Progression  Progress towards PT goals Progressing toward goals  Acute Rehab PT Goals  PT Goal Formulation With patient  Time For Goal Achievement 02/07/14  Potential to Achieve Goals Good  PT General Charges  $$ ACUTE PT VISIT 1 Procedure  PT Treatments  $Gait Training 8-22 mins

## 2014-02-03 DIAGNOSIS — I2510 Atherosclerotic heart disease of native coronary artery without angina pectoris: Secondary | ICD-10-CM | POA: Diagnosis not present

## 2014-02-03 DIAGNOSIS — I5040 Unspecified combined systolic (congestive) and diastolic (congestive) heart failure: Secondary | ICD-10-CM | POA: Diagnosis not present

## 2014-02-03 DIAGNOSIS — F329 Major depressive disorder, single episode, unspecified: Secondary | ICD-10-CM | POA: Diagnosis not present

## 2014-02-03 DIAGNOSIS — E785 Hyperlipidemia, unspecified: Secondary | ICD-10-CM | POA: Diagnosis not present

## 2014-02-03 DIAGNOSIS — R195 Other fecal abnormalities: Secondary | ICD-10-CM | POA: Diagnosis not present

## 2014-02-03 DIAGNOSIS — K5900 Constipation, unspecified: Secondary | ICD-10-CM | POA: Diagnosis not present

## 2014-02-03 DIAGNOSIS — I509 Heart failure, unspecified: Secondary | ICD-10-CM | POA: Diagnosis not present

## 2014-02-03 DIAGNOSIS — Z471 Aftercare following joint replacement surgery: Secondary | ICD-10-CM | POA: Diagnosis not present

## 2014-02-03 DIAGNOSIS — R269 Unspecified abnormalities of gait and mobility: Secondary | ICD-10-CM | POA: Diagnosis not present

## 2014-02-03 DIAGNOSIS — E43 Unspecified severe protein-calorie malnutrition: Secondary | ICD-10-CM | POA: Insufficient documentation

## 2014-02-03 DIAGNOSIS — M1710 Unilateral primary osteoarthritis, unspecified knee: Secondary | ICD-10-CM | POA: Diagnosis not present

## 2014-02-03 DIAGNOSIS — Z96659 Presence of unspecified artificial knee joint: Secondary | ICD-10-CM | POA: Diagnosis not present

## 2014-02-03 DIAGNOSIS — I10 Essential (primary) hypertension: Secondary | ICD-10-CM | POA: Diagnosis not present

## 2014-02-03 DIAGNOSIS — IMO0002 Reserved for concepts with insufficient information to code with codable children: Secondary | ICD-10-CM | POA: Diagnosis not present

## 2014-02-03 DIAGNOSIS — M25569 Pain in unspecified knee: Secondary | ICD-10-CM | POA: Diagnosis not present

## 2014-02-03 DIAGNOSIS — R0602 Shortness of breath: Secondary | ICD-10-CM | POA: Diagnosis not present

## 2014-02-03 DIAGNOSIS — M1990 Unspecified osteoarthritis, unspecified site: Secondary | ICD-10-CM | POA: Diagnosis not present

## 2014-02-03 DIAGNOSIS — J449 Chronic obstructive pulmonary disease, unspecified: Secondary | ICD-10-CM | POA: Diagnosis not present

## 2014-02-03 DIAGNOSIS — M6281 Muscle weakness (generalized): Secondary | ICD-10-CM | POA: Diagnosis not present

## 2014-02-03 DIAGNOSIS — S8990XA Unspecified injury of unspecified lower leg, initial encounter: Secondary | ICD-10-CM | POA: Diagnosis not present

## 2014-02-03 DIAGNOSIS — F3289 Other specified depressive episodes: Secondary | ICD-10-CM | POA: Diagnosis not present

## 2014-02-03 DIAGNOSIS — I4891 Unspecified atrial fibrillation: Secondary | ICD-10-CM | POA: Diagnosis not present

## 2014-02-03 MED ORDER — TIZANIDINE HCL 4 MG PO TABS: 4.0000 mg | ORAL_TABLET | Freq: Three times a day (TID) | ORAL | Status: DC | PRN

## 2014-02-03 MED ORDER — TRAMADOL HCL 50 MG PO TABS: 50.0000 mg | ORAL_TABLET | Freq: Four times a day (QID) | ORAL | Status: DC | PRN

## 2014-02-03 NOTE — Plan of Care (Signed)
Problem: Consults Goal: Diagnosis- Total Joint Replacement Outcome: Completed/Met Date Met:  02/03/14 Primary Total Knee LEFT

## 2014-02-03 NOTE — Discharge Summary (Signed)
Physician Discharge Summary  Patient ID: Aaron Berg MRN: [id number] DOB/AGE: 1936-02-14 78 y.o.  Admit date: 01/31/2014 Discharge date:  02/03/2014  Procedures:  Procedure(s) (LRB): LEFT TOTAL KNEE ARTHROPLASTY (Left)  Attending Physician:  Dr. Paralee Cancel   Admission Diagnoses:   Left knee OA / pain  Discharge Diagnoses:  Principal Problem:   S/P left TKA Active Problems:   Expected blood loss anemia   Obesity (BMI 30-39.9)   Protein-calorie malnutrition, severe  Past Medical History  Diagnosis Date  . ASHD (arteriosclerotic heart disease)   . Dyslipidemia     well controlled  . Obesity   . Arthritis   . Male hypogonadism   . Gait disorder   . Hypertension   . Dysrhythmia 2003    AFib after CABG; warfarin x 6 months  . Sleep apnea     cpap  . CHF (congestive heart failure)   . Dry eyes   . Complication of anesthesia     "woke up too soon"  . Depression   . COPD (chronic obstructive pulmonary disease)     "no signs or tests"  . Headache(784.0)     silent migraines- no pain just go blind for just a minute  . Cancer     skin  . Memory loss, short term 2/15    per daughter since last surgery and placement at [facility].  "Hallucinations have stopped"    HPI: [name], 78 y.o. male, has a history of pain and functional disability in the left knee due to arthritis and has failed non-surgical conservative treatments for greater than 12 weeks to includeNSAID's and/or analgesics, corticosteriod injections, use of assistive devices and activity modification. Onset of symptoms was gradual, starting 7+ years ago with gradually worsening course since that time. The patient noted prior procedures on the knee to include arthroplasty on the right knee(s). Patient currently rates pain in the left knee(s) at 7 out of 10 with activity. Patient has worsening of pain with activity and weight bearing, pain that interferes with activities of daily living, pain with passive  range of motion, crepitus and joint swelling. Patient has evidence of periarticular osteophytes and joint space narrowing by imaging studies. There is no active infection. Risks, benefits and expectations were discussed with the patient. Risks including but not limited to the risk of anesthesia, blood clots, nerve damage, blood vessel damage, failure of the prosthesis, infection and up to and including death. Patient understand the risks, benefits and expectations and wishes to proceed with surgery.  PCP: Wyatt Haste, MD   Discharged Condition: good  Hospital Course:  Patient underwent the above stated procedure on 01/31/2014. Patient tolerated the procedure well and brought to the recovery room in good condition and subsequently to the floor.  POD #1 BP: 94/54 ; Pulse: 65 ; Temp: 98.5 F (36.9 C) ; Resp: 16  Patient reports pain as mild, pain controlled. Some confusion last night, which has occurred previously. Was a little confused on where he was when he first woke up, however he did know who his daughter was when she walked in the room.  Neurovascular intact, dorsiflexion/plantar flexion intact, incision: dressing C/D/I, no cellulitis present and compartment soft.   LABS  Basename    HGB  8.9  HCT  27.4   POD #2  BP: 115/72 ; Pulse: 86 ; Temp: 97.7 F (36.5 C) ; Resp: 16 Patient reports pain as mild, pain controlled with tramadol. Appears confusion is getting better. He addressed  me by name when I entered the room and knew where he was this morning. Confusion better after being on tramadol and off the hydrocodone.  No events throughout the night. Neurovascular intact, dorsiflexion/plantar flexion intact, incision: dressing C/D/I, no cellulitis present and compartment soft.   LABS  Basename    HGB  8.3  HCT  25.3   POD #3  BP: 101/65 ; Pulse: 77 ; Temp: 98.6 F (37 C) ; Resp: 16 Patient reports pain as mild, pain controlled. No events throughout the night. Ready to be  discharged to SNF. Neurovascular intact, dorsiflexion/plantar flexion intact, incision: dressing C/D/I, no cellulitis present and compartment soft.   LABS   No new labs  Discharge Exam: General appearance: alert, cooperative and no distress Extremities: Homans sign is negative, no sign of DVT, no edema, redness or tenderness in the calves or thighs and no ulcers, gangrene or trophic changes  Disposition:    Skilled Nursing Facility with follow up in 2 weeks   Follow-up Information   Follow up with Shelda Pal, MD. Schedule an appointment as soon as possible for a visit in 2 weeks.   Specialty:  Orthopedic Surgery   Contact information:   [address] [place] [address] [phone]       Discharge Orders   Future Appointments Provider Department Dept Phone   04/11/2014 10:15 AM Lesleigh Noe, MD [facility] [phone]   Future Orders Complete By Expires   Call MD / Call 911  As directed    Comments:     If you experience chest pain or shortness of breath, CALL 911 and be transported to the hospital emergency room.  If you develope a fever above 101 F, pus (white drainage) or increased drainage or redness at the wound, or calf pain, call your surgeon's office.   Change dressing  As directed    Comments:     Maintain surgical dressing for 10-14 days, or until follow up in the clinic.   Constipation Prevention  As directed    Comments:     Drink plenty of fluids.  Prune juice may be helpful.  You may use a stool softener, such as Colace (over the counter) 100 mg twice a day.  Use MiraLax (over the counter) for constipation as needed.   Diet - low sodium heart healthy  As directed    Discharge instructions  As directed    Comments:     Maintain surgical dressing for 10-14 days, or until follow up in the clinic. Follow up in 2 weeks at [facility]. Call with any questions or concerns.   Increase activity slowly as tolerated   As directed    TED hose  As directed    Comments:     Use stockings (TED hose) for 2 weeks on both leg(s).  You may remove them at night for sleeping.   Weight bearing as tolerated  As directed    Questions:     Laterality:     Extremity:          Medication List    STOP taking these medications       HYDROcodone-acetaminophen 7.5-325 MG per tablet  Commonly known as:  NORCO     methocarbamol 500 MG tablet  Commonly known as:  ROBAXIN      TAKE these medications       acetaminophen 325 MG tablet  Commonly known as:  TYLENOL  Take 650 mg by  mouth every 4 (four) hours as needed for mild pain or moderate pain.     cholecalciferol 1000 UNITS tablet  Commonly known as:  VITAMIN D  Take 1,000 Units by mouth daily.     clopidogrel 75 MG tablet  Commonly known as:  PLAVIX  Take 75 mg by mouth daily with breakfast.     docusate sodium 100 MG capsule  Commonly known as:  COLACE  Take 100 mg by mouth 2 (two) times daily.     ferrous fumarate 325 (106 FE) MG Tabs tablet  Commonly known as:  HEMOCYTE - 106 mg FE  Take 1 tablet by mouth 3 (three) times daily.     hydrochlorothiazide 25 MG tablet  Commonly known as:  HYDRODIURIL  Take 25 mg by mouth daily with breakfast.     metoprolol succinate 100 MG 24 hr tablet  Commonly known as:  TOPROL-XL  Take 100 mg by mouth daily with breakfast. Take with or immediately following a meal.     multivitamin with minerals tablet  Take 1 tablet by mouth daily.     polyethylene glycol packet  Commonly known as:  MIRALAX / GLYCOLAX  Take 17 g by mouth daily.     ramipril 10 MG capsule  Commonly known as:  ALTACE  Take 10 mg by mouth 2 (two) times daily.     rosuvastatin 40 MG tablet  Commonly known as:  CRESTOR  Take 40 mg by mouth daily with breakfast.     spiritus frumenti Soln  Commonly known as:  ethyl alcohol  Take 1 each by mouth at bedtime as needed (may repeat once if needed).     testosterone 50 MG/5GM Gel    Commonly known as:  ANDROGEL  Place 2 g onto the skin daily. 40.5 mg total     tiZANidine 4 MG tablet  Commonly known as:  ZANAFLEX  Take 1 tablet (4 mg total) by mouth 3 (three) times daily as needed for muscle spasms.     traMADol 50 MG tablet  Commonly known as:  ULTRAM  Take 1-2 tablets (50-100 mg total) by mouth every 6 (six) hours as needed. Per pt daughter, may have 50 mg additional every 6 hrs if needed         Signed: [place] Pugh. Katherina Wimer   PAC  02/03/2014, 8:26 AM

## 2014-02-03 NOTE — Progress Notes (Signed)
   Subjective: 3 Days Post-Op Procedure(s) (LRB): LEFT TOTAL KNEE ARTHROPLASTY (Left)   Patient reports pain as mild, pain controlled. No events throughout the night. Ready to be discharged to SNF.  Objective:   VITALS:   Filed Vitals:   02/03/14 0531  BP: 101/65  Pulse: 77  Temp: 98.6 F (37 C)  Resp: 16    Neurovascular intact Dorsiflexion/Plantar flexion intact Incision: dressing C/D/I No cellulitis present Compartment soft  LABS  Recent Labs  02/01/14 0410 02/02/14 0421  HGB 8.9* 8.3*  HCT 27.4* 25.3*  WBC 11.0* 9.7  PLT 176 134*     Recent Labs  02/01/14 0410 02/02/14 0421  NA 137 141  K 4.3 4.2  BUN 36* 28*  CREATININE 1.72* 1.08  GLUCOSE 128* 114*     Assessment/Plan: 3 Days Post-Op Procedure(s) (LRB): LEFT TOTAL KNEE ARTHROPLASTY (Left) Up with therapy Discharge to SNF Follow up in 2 weeks at [facility] Follow up with OLIN,Cesilia Shinn D in 2 weeks.  Contact information:  [facility] [address], [address] [place] [address]   PAC  02/03/2014, 8:21 AM

## 2014-02-03 NOTE — Progress Notes (Signed)
Pt d/c to [facility]. Attempted to give report. Was unable to reach the nurse receiving pt. Pt in no pain at time of d/c and denies need of pain medication. Pt ready to be d/c.

## 2014-02-05 NOTE — Progress Notes (Signed)
Pt returned to [facility] via [facility] transport on Friday 2/20 /15.   Werner Lean LCSW [phone]

## 2014-02-15 ENCOUNTER — Other Ambulatory Visit: Payer: Self-pay | Admitting: *Deleted

## 2014-02-15 MED ORDER — TESTOSTERONE 40.5 MG/2.5GM (1.62%) TD GEL: TRANSDERMAL | Status: DC

## 2014-02-15 NOTE — Telephone Encounter (Signed)
[facility] 

## 2014-02-17 ENCOUNTER — Encounter: Payer: Self-pay | Admitting: *Deleted

## 2014-02-24 ENCOUNTER — Other Ambulatory Visit: Payer: Self-pay

## 2014-02-24 LAB — CBC WITH DIFFERENTIAL/PLATELET
Basophil #: 0 10*3/uL (ref 0.0–0.1)
Basophil %: 0.7 %
EOS PCT: 1.8 %
Eosinophil #: 0.1 10*3/uL (ref 0.0–0.7)
HCT: 35.8 % — ABNORMAL LOW (ref 40.0–52.0)
HGB: 11.9 g/dL — ABNORMAL LOW (ref 13.0–18.0)
Lymphocyte #: 1.2 10*3/uL (ref 1.0–3.6)
Lymphocyte %: 18.1 %
MCH: 30.4 pg (ref 26.0–34.0)
MCHC: 33.2 g/dL (ref 32.0–36.0)
MCV: 92 fL (ref 80–100)
MONOS PCT: 8.1 %
Monocyte #: 0.5 x10 3/mm (ref 0.2–1.0)
Neutrophil #: 4.8 10*3/uL (ref 1.4–6.5)
Neutrophil %: 71.3 %
PLATELETS: 180 10*3/uL (ref 150–440)
RBC: 3.9 10*6/uL — AB (ref 4.40–5.90)
RDW: 15.3 % — AB (ref 11.5–14.5)
WBC: 6.7 10*3/uL (ref 3.8–10.6)

## 2014-02-24 LAB — BASIC METABOLIC PANEL
ANION GAP: 3 — AB (ref 7–16)
BUN: 25 mg/dL — AB (ref 7–18)
CALCIUM: 10.3 mg/dL — AB (ref 8.5–10.1)
CHLORIDE: 106 mmol/L (ref 98–107)
CREATININE: 1.38 mg/dL — AB (ref 0.60–1.30)
Co2: 30 mmol/L (ref 21–32)
GFR CALC AF AMER: 58 — AB
GFR CALC NON AF AMER: 50 — AB
GLUCOSE: 97 mg/dL (ref 65–99)
OSMOLALITY: 282 (ref 275–301)
Potassium: 4.2 mmol/L (ref 3.5–5.1)
SODIUM: 139 mmol/L (ref 136–145)

## 2014-03-03 ENCOUNTER — Encounter: Payer: Self-pay | Admitting: Adult Health

## 2014-03-03 ENCOUNTER — Non-Acute Institutional Stay (SKILLED_NURSING_FACILITY): Payer: Medicare Other | Admitting: Adult Health

## 2014-03-03 DIAGNOSIS — K5900 Constipation, unspecified: Secondary | ICD-10-CM

## 2014-03-03 DIAGNOSIS — E785 Hyperlipidemia, unspecified: Secondary | ICD-10-CM

## 2014-03-03 DIAGNOSIS — I480 Paroxysmal atrial fibrillation: Secondary | ICD-10-CM | POA: Insufficient documentation

## 2014-03-03 DIAGNOSIS — Z96659 Presence of unspecified artificial knee joint: Secondary | ICD-10-CM

## 2014-03-03 DIAGNOSIS — I4891 Unspecified atrial fibrillation: Secondary | ICD-10-CM | POA: Diagnosis not present

## 2014-03-03 DIAGNOSIS — I10 Essential (primary) hypertension: Secondary | ICD-10-CM

## 2014-03-03 DIAGNOSIS — D62 Acute posthemorrhagic anemia: Secondary | ICD-10-CM

## 2014-03-03 NOTE — Progress Notes (Signed)
Patient ID: Aaron Berg, male   DOB: [birth year], 78 y.o.   MRN: [id number]               PROGRESS NOTE  DATE: 03/03/2014  FACILITY: Nursing Home Location: [facility]  LEVEL OF CARE: SNF (31)  Routine Visit  CHIEF COMPLAINT:  Manage Hypertension, Atrial Fibrillation, and Hyperlipidemia  HISTORY OF PRESENT ILLNESS:  REASSESSMENT OF ONGOING PROBLEM(S):  HTN: Pt 's HTN remains unstable.  Denies CP, sob, DOE, pedal edema, headaches. Complains of occasional dizziness   BP logs: 85/60, 118/67/ 105/71, 97/69, 108/62  ATRIAL FIBRILLATION: the patients atrial fibrillation remains stable.  The patient denies DOE, tachycardia, orthopnea, transient neurological sx, pedal edema, palpitations, & PNDs.  No complications noted from the medications currently being used.  ANEMIA: The anemia has been stable. The patient denies fatigue, melena or hematochezia. No complications from the medications currently being used. 2/15 hgb 9.1   PAST MEDICAL HISTORY : Reviewed.  No changes.  CURRENT MEDICATIONS: Reviewed per [facility]  REVIEW OF SYSTEMS:  GENERAL: no change in appetite, no fatigue, no weight changes, no fever, chills or weakness RESPIRATORY: no cough, SOB, DOE, wheezing, hemoptysis CARDIAC: no chest pain, edema or palpitations GI: no abdominal pain, diarrhea, constipation, heart burn, nausea or vomiting  PHYSICAL EXAMINATION  GENERAL: no acute distress, normal body habitus NECK: supple, trachea midline, no neck masses, no thyroid tenderness, no thyromegaly RESPIRATORY: breathing is even & unlabored, BS CTAB CARDIAC: RRR, no murmur,no extra heart sounds, no edema GI: abdomen soft, normal BS, no masses, no tenderness, no hepatomegaly, no splenomegaly PSYCHIATRIC: the patient is alert & oriented to person, affect & behavior appropriate  LABS/RADIOLOGY: 02/23/14  Sodium 139 potassium 4.2 glucose 85 BUN 26 creatinine 1.5 calcium 9.8 02/09/14 sodium 137 potassium 4.3 glucose 89  BUN 28 creatinine 1.3 calcium 9.7 WBC 8.3 hemoglobin 9.1 hematocrit 28.9 amylase 23 lipase 15 Labs reviewed: Basic Metabolic Panel:  Recent Labs  01/25/14 1450 02/01/14 0410 02/02/14 0421  NA 138 137 141  K 4.0 4.3 4.2  CL 99 101 104  CO2 27 23 23   GLUCOSE 109* 128* 114*  BUN 26* 36* 28*  CREATININE 1.24 1.72* 1.08  CALCIUM 10.4 9.4 9.2   Liver Function Tests:  Recent Labs  10/05/13 1137  AST 23  ALT 17  ALKPHOS 41  BILITOT 0.5  PROT 6.8  ALBUMIN 4.1    CBC:  Recent Labs  10/05/13 1137  01/25/14 1450 02/01/14 0410 02/02/14 0421  WBC 5.4  < > 7.6 11.0* 9.7  NEUTROABS 3.3  --   --   --   --   HGB 13.2  < > 11.3* 8.9* 8.3*  HCT 38.6*  < > 34.8* 27.4* 25.3*  MCV 92.8  < > 95.3 93.2 92.7  PLT 141*  < > 211 176 134*  < > = values in this interval not displayed.  Lipid Panel:  Recent Labs  10/05/13 1137  HDL 53     ASSESSMENT/PLAN:  End stage arthritis status post left total knee arthroplasty - continue rehabilitation  Hypertension - has been refusing to take his Lisinopril and Toprol daily due to hypotension and feeling dizzy; discontinue Lisinopril and decrease Toprol XL to 25 mg PO Q D; BP/HR Q shift  Hyperlipidemia - continue Crestor  Male hypogonadism - continue testosterone  Constipation - continue Colace and MiraLax  Anemia, acute blood loss - has been refusing to take FeSO4; discontinue FeSO4 and check CBC and BMP  next lab draw    Atrial Fibrillation - rate controlled; on Toprol XL   CPT CODE: 94854  Juaquina Machnik Vargas - NP [facility] [phone]

## 2014-03-06 ENCOUNTER — Other Ambulatory Visit: Payer: Self-pay | Admitting: *Deleted

## 2014-03-06 MED ORDER — TESTOSTERONE 20.25 MG/1.25GM (1.62%) TD GEL: TRANSDERMAL | Status: DC

## 2014-03-06 NOTE — Telephone Encounter (Signed)
[facility] 

## 2014-03-08 ENCOUNTER — Encounter: Payer: Self-pay | Admitting: Adult Health

## 2014-03-08 ENCOUNTER — Non-Acute Institutional Stay (SKILLED_NURSING_FACILITY): Payer: Medicare Other | Admitting: Adult Health

## 2014-03-08 DIAGNOSIS — I509 Heart failure, unspecified: Secondary | ICD-10-CM

## 2014-03-08 DIAGNOSIS — E291 Testicular hypofunction: Secondary | ICD-10-CM

## 2014-03-08 DIAGNOSIS — I5042 Chronic combined systolic (congestive) and diastolic (congestive) heart failure: Secondary | ICD-10-CM

## 2014-03-08 DIAGNOSIS — IMO0002 Reserved for concepts with insufficient information to code with codable children: Secondary | ICD-10-CM | POA: Diagnosis not present

## 2014-03-08 DIAGNOSIS — Z96659 Presence of unspecified artificial knee joint: Secondary | ICD-10-CM

## 2014-03-08 DIAGNOSIS — K5900 Constipation, unspecified: Secondary | ICD-10-CM

## 2014-03-08 DIAGNOSIS — M1710 Unilateral primary osteoarthritis, unspecified knee: Secondary | ICD-10-CM

## 2014-03-08 DIAGNOSIS — E785 Hyperlipidemia, unspecified: Secondary | ICD-10-CM | POA: Diagnosis not present

## 2014-03-08 DIAGNOSIS — M1712 Unilateral primary osteoarthritis, left knee: Secondary | ICD-10-CM | POA: Insufficient documentation

## 2014-03-08 DIAGNOSIS — D62 Acute posthemorrhagic anemia: Secondary | ICD-10-CM

## 2014-03-08 DIAGNOSIS — I10 Essential (primary) hypertension: Secondary | ICD-10-CM | POA: Diagnosis not present

## 2014-03-08 DIAGNOSIS — I4891 Unspecified atrial fibrillation: Secondary | ICD-10-CM

## 2014-03-08 NOTE — Progress Notes (Signed)
Patient ID: Aaron Berg, male   DOB: [birth year], 78 y.o.   MRN: [id number]               PROGRESS NOTE  DATE: 03/08/14  FACILITY: Nursing Home Location: [facility]  LEVEL OF CARE: SNF (31)  Acute Visit  CHIEF COMPLAINT:  Discharge Notes  HISTORY OF PRESENT ILLNESS: This is a 78 year old male who has been re-admitted to [facility] on 02/03/14 from [facility] with Osteoarthritis S/P left total knee arthroplasty. Patient was admitted to this facility for short-term rehabilitation after the patient's recent hospitalization.  Patient has completed SNF rehabilitation and therapy has cleared the patient for discharge.  REASSESSMENT OF ONGOING PROBLEM(S):  HTN: Pt 's HTN remains unstable.  Denies CP, sob, DOE, headaches. Complains of occasional dizziness. He has been refusing his medications.   Last BP 126/82  ATRIAL FIBRILLATION: the patients atrial fibrillation remains stable.  The patient denies DOE, tachycardia, orthopnea, transient neurological sx, pedal edema, palpitations, & PNDs.  No complications noted from the medications currently being used.  HYPERLIPIDEMIA: No complications from the medications presently being used. 10/14 fasting lipid panel showed : Cholesterol 182 triglycerides 253 HDL 53 LDL 78  PAST MEDICAL HISTORY : Reviewed.  No changes.  CURRENT MEDICATIONS: Reviewed per [facility]  REVIEW OF SYSTEMS:  GENERAL: no change in appetite, no fatigue, no weight changes, no fever, chills or weakness RESPIRATORY: no cough, SOB, DOE, wheezing, hemoptysis CARDIAC: no chest pain, or palpitations, + edema GI: no abdominal pain, diarrhea, constipation, heart burn, nausea or vomiting  PHYSICAL EXAMINATION  GENERAL: no acute distress, normal body habitus NECK: supple, trachea midline, no neck masses, no thyroid tenderness, no thyromegaly LYMPHATIC: No adenopathy in the cervical, supra-clavicular or axillary areas RESPIRATORY: breathing is even &  unlabored, BS CTAB CARDIAC: RRR,+ murmur,no extra heart sounds, RLE edema 2+ GI: abdomen soft, normal BS, no masses, no tenderness, no hepatomegaly, no splenomegaly PSYCHIATRIC: the patient is alert & oriented to person, affect & behavior appropriate  LABS/RADIOLOGY: 03/03/14 sodium 140 potassium 4.3 glucose 84 BUN 14 creatinine 1.0 calcium 9.6 02/23/14  Sodium 139 potassium 4.2 glucose 85 BUN 26 creatinine 1.5 calcium 9.8 02/09/14 sodium 137 potassium 4.3 glucose 89 BUN 28 creatinine 1.3 calcium 9.7 WBC 8.3 hemoglobin 9.1 hematocrit 28.9 amylase 23 lipase 15 Labs reviewed: Basic Metabolic Panel:  Recent Labs  01/25/14 1450 02/01/14 0410 02/02/14 0421  NA 138 137 141  K 4.0 4.3 4.2  CL 99 101 104  CO2 27 23 23   GLUCOSE 109* 128* 114*  BUN 26* 36* 28*  CREATININE 1.24 1.72* 1.08  CALCIUM 10.4 9.4 9.2   Liver Function Tests:  Recent Labs  10/05/13 1137  AST 23  ALT 17  ALKPHOS 41  BILITOT 0.5  PROT 6.8  ALBUMIN 4.1    CBC:  Recent Labs  10/05/13 1137  01/25/14 1450 02/01/14 0410 02/02/14 0421  WBC 5.4  < > 7.6 11.0* 9.7  NEUTROABS 3.3  --   --   --   --   HGB 13.2  < > 11.3* 8.9* 8.3*  HCT 38.6*  < > 34.8* 27.4* 25.3*  MCV 92.8  < > 95.3 93.2 92.7  PLT 141*  < > 211 176 134*  < > = values in this interval not displayed.  Lipid Panel:  Recent Labs  10/05/13 1137  HDL 53     ASSESSMENT/PLAN:  End stage arthritis status post left total knee arthroplasty - for home  health PT, OT and nursing  Hypertension - has been refusing to take his Toprol  Hyperlipidemia - continue Crestor  Male hypogonadism - continue testosterone  Constipation - continue Colace and MiraLax  Anemia, acute blood loss - stable; recently discontinued FeSO4 due to refusals   Atrial Fibrillation - rate controlled; on Toprol XL  Chronic combined systolic and diastolic CHF - refused to take HCTZ; cardiology follow-up consult   I have filled out patient's discharge paperwork and  written prescriptions.  Patient will receive home health PT, OT and Nursing.   Total discharge time: Less than 30 minutes Discharge time involved coordination of the discharge process with Education officer, museum, nursing staff and therapy department. Medical justification for home health services verified.  CPT CODE: 12248  Seth Bake - NP [facility] [phone]

## 2014-03-09 ENCOUNTER — Encounter: Payer: Self-pay | Admitting: Physician Assistant

## 2014-03-09 ENCOUNTER — Ambulatory Visit (INDEPENDENT_AMBULATORY_CARE_PROVIDER_SITE_OTHER): Payer: Medicare Other | Admitting: Physician Assistant

## 2014-03-09 VITALS — BP 110/62 | HR 83 | Ht 69.0 in | Wt 243.0 lb

## 2014-03-09 DIAGNOSIS — I10 Essential (primary) hypertension: Secondary | ICD-10-CM | POA: Diagnosis not present

## 2014-03-09 DIAGNOSIS — I2510 Atherosclerotic heart disease of native coronary artery without angina pectoris: Secondary | ICD-10-CM | POA: Diagnosis not present

## 2014-03-09 DIAGNOSIS — E785 Hyperlipidemia, unspecified: Secondary | ICD-10-CM | POA: Diagnosis not present

## 2014-03-09 DIAGNOSIS — R609 Edema, unspecified: Secondary | ICD-10-CM

## 2014-03-09 DIAGNOSIS — I5040 Unspecified combined systolic (congestive) and diastolic (congestive) heart failure: Secondary | ICD-10-CM | POA: Diagnosis not present

## 2014-03-09 DIAGNOSIS — I255 Ischemic cardiomyopathy: Secondary | ICD-10-CM

## 2014-03-09 DIAGNOSIS — I359 Nonrheumatic aortic valve disorder, unspecified: Secondary | ICD-10-CM

## 2014-03-09 DIAGNOSIS — R0602 Shortness of breath: Secondary | ICD-10-CM

## 2014-03-09 DIAGNOSIS — I2589 Other forms of chronic ischemic heart disease: Secondary | ICD-10-CM

## 2014-03-09 LAB — BRAIN NATRIURETIC PEPTIDE: Pro B Natriuretic peptide (BNP): 255 pg/mL — ABNORMAL HIGH (ref 0.0–100.0)

## 2014-03-09 LAB — BASIC METABOLIC PANEL
BUN: 13 mg/dL (ref 6–23)
CALCIUM: 9.9 mg/dL (ref 8.4–10.5)
CO2: 29 meq/L (ref 19–32)
Chloride: 105 mEq/L (ref 96–112)
Creatinine, Ser: 1 mg/dL (ref 0.4–1.5)
GFR: 74.64 mL/min (ref >60.00)
Glucose, Bld: 89 mg/dL (ref 70–99)
Potassium: 4 mEq/L (ref 3.5–5.1)
SODIUM: 140 meq/L (ref 135–145)

## 2014-03-09 MED ORDER — METOPROLOL SUCCINATE ER 25 MG PO TB24: 12.5000 mg | ORAL_TABLET | Freq: Every day | ORAL | Status: DC

## 2014-03-09 MED ORDER — FUROSEMIDE 20 MG PO TABS: 20.0000 mg | ORAL_TABLET | ORAL | Status: DC

## 2014-03-09 MED ORDER — POTASSIUM CHLORIDE CRYS ER 20 MEQ PO TBCR: 20.0000 meq | EXTENDED_RELEASE_TABLET | ORAL | Status: DC

## 2014-03-09 NOTE — Progress Notes (Signed)
[place], [place], [place]  [address] Phone: [phone] Fax:  [phone]  Date:  03/09/2014   ID:  Aaron Berg, DOB 10/01/1936, MRN [id number]  PCP:  Wyatt Haste, MD  Cardiologist:  Dr. Daneen Schick     History of Present Illness: Aaron Berg is a 78 y.o. male with a history of CAD s/p CABG, Ischemic cardiomyopathy, combined systolic and diastolic CHF, aortic stenosis, HTN, HL.  Last seen by Dr. Tamala Julian 09/2013. He was cleared to proceed with surgery at that point in time.  Patient underwent left knee replacement in January and then right knee replacement in February. He is currently staying at [facility]. There has been significant concern recently about his blood pressure. He has been hypotensive. He has felt tired with this. Patient has refused to take hydrochlorothiazide, Ramipril and metoprolol succinate. He does get some intermittent dizziness associated with his vertigo. He denies chest discomfort or shortness of breath. He has noted increased pedal edema recently. He denies orthopnea or PND. He denies syncope.  Studies:  - LHC (03/07/11):  EF 30-40% distal left main occluded, LAD occluded, circumflex occluded, ostial RCA occluded; LIMA-LAD patent, SVG-circumflex patent with ostial 50%, SVG-RCA patent  - Echo (04/20/13):  EF 40-45%, mild LVH, mild to moderate aortic root dilatation (4.3 cm), grade 2 diastolic dysfunction, mild AS (Mean gradient 12), trivial TR  - Nuclear (02/28/11):  Anterior, anteroseptal and apical anterior ischemia; inferoseptal, inferior and apical infarct with mild peri-infarct ischemia, EF 39%   Recent Labs: 10/05/2013: ALT 17; HDL Cholesterol 53; LDL (calc) 78  02/02/2014: Creatinine 1.08; Hemoglobin 8.3*; Potassium 4.2   Wt Readings from Last 3 Encounters:  03/09/14 243 lb (110.224 kg)  03/08/14 241 lb (109.317 kg)  03/03/14 238 lb 9.6 oz (108.228 kg)     Past Medical History  Diagnosis Date  . ASHD (arteriosclerotic  heart disease)   . Dyslipidemia     well controlled  . Obesity   . Arthritis   . Male hypogonadism   . Gait disorder   . Hypertension   . Dysrhythmia 2003    AFib after CABG; warfarin x 6 months  . Sleep apnea     cpap  . CHF (congestive heart failure)   . Dry eyes   . Complication of anesthesia     "woke up too soon"  . Depression   . COPD (chronic obstructive pulmonary disease)     "no signs or tests"  . Headache(784.0)     silent migraines- no pain just go blind for just a minute  . Cancer     skin  . Memory loss, short term 2/15    per daughter since last surgery and placement at [facility].  "Hallucinations have stopped"    Current Outpatient Prescriptions  Medication Sig Dispense Refill  . acetaminophen (TYLENOL) 325 MG tablet Take 650 mg by mouth every 4 (four) hours as needed for mild pain or moderate pain.      . cholecalciferol (VITAMIN D) 1000 UNITS tablet Take 1,000 Units by mouth daily.      . clopidogrel (PLAVIX) 75 MG tablet Take 75 mg by mouth daily with breakfast. For DVT/Prophyaxis      . docusate sodium (COLACE) 100 MG capsule Take 100 mg by mouth 2 (two) times daily. For constipation      . ferrous fumarate (HEMOCYTE - 106 MG FE) 325 (106 FE) MG TABS tablet Take 1 tablet by mouth.      [name]  hydrochlorothiazide (HYDRODIURIL) 25 MG tablet Take 25 mg by mouth daily.      . methocarbamol (ROBAXIN) 500 MG tablet Take 500 mg by mouth every 6 (six) hours as needed for muscle spasms.      . metoprolol succinate (TOPROL-XL) 100 MG 24 hr tablet Take 100 mg by mouth daily. Take with or immediately following a meal.      . Multiple Vitamins-Minerals (MULTIVITAMIN WITH MINERALS) tablet Take 1 tablet by mouth daily.      . polyethylene glycol (MIRALAX / GLYCOLAX) packet Take 17 g by mouth daily. Mix with 6-8 oz of liquid daily. For constipation.      . rosuvastatin (CRESTOR) 40 MG tablet Take 40 mg by mouth daily with breakfast. For Hyperlipdemia      . Testosterone (ANDROGEL)  20.25 MG/1.25GM (1.62%) GEL Apply contents of 1 packet=1.25gm topically to each shoulder daily for hormone therapy  1.25 g  5  . traMADol (ULTRAM) 50 MG tablet Take 1-2 tablets (50-100 mg total) by mouth every 6 (six) hours as needed. Per pt daughter, may have 50 mg additional every 6 hrs if needed  80 tablet  0   No current facility-administered medications for this visit.    Allergies:   Procardia   Social History:  The patient  reports that he quit smoking about 24 years ago. His smoking use included Cigarettes. He smoked 0.00 packs per day for 30 years. He has never used smokeless tobacco. He reports that he drinks alcohol. He reports that he does not use illicit drugs.   Family History:  The patient's family history includes CAD in his father and mother; Pancreatic cancer in his father.   ROS:  Please see the history of present illness.      All other systems reviewed and negative.   PHYSICAL EXAM: VS:  BP 110/62  Pulse 83  Ht 5\' 9"  (1.753 m)  Wt 243 lb (110.224 kg)  BMI 35.87 kg/m2 Well nourished, well developed, in no acute distress HEENT: normal Neck: no JVDAt 90 Cardiac:  normal S1, S2; RRR; 2/6 systolic crescendo-decrescendo murmur@RUSB  Lungs:  clear to auscultation bilaterally, no wheezing, rhonchi or rales Abd: soft, nontender, no hepatomegaly Ext: 1+ bilateral LE edema Skin: warm and dry Neuro:  CNs 2-12 intact, no focal abnormalities noted  EKG:  NSR, HR 83, frequent PACs and PVCs, one ventricular couplet, nonspecific ST-T wave changes     ASSESSMENT AND PLAN:  1. Chronic Combined Systolic and Diastolic CHF: Patient has had some increased edema recently. I suspect that this is related to holding his hydrochlorothiazide. I have recommended resuming diuretics with Lasix 20 mg on Mondays, Wednesdays and Fridays along with potassium 20 mEq. Hopefully he can tolerate this and not drop his blood pressure too much.  Obtain a basic metabolic panel and BNP today. 2. Ischemic  Cardiomyopathy:  He has had to stop his beta blocker and ACEI due to hypotension. I will try to resume low-dose beta blocker with metoprolol succinate 12.5 mg daily. We will need to slowly try to resume his other medications as his blood pressure will tolerate. 3. CAD s/p CABG:  No angina. Continue Plavix, statin. 4. Aortic Stenosis:  Murmur on exam c/w AS.  AS is mild by recent measurement.  No symptoms clinically to suggest worsening. 5. Atrial Fibrillation:  This is listed in his notes from [facility]  He did have post AFib after his CABG in 2004.  There has not been documented AFib since that  time and he is in NSR today.  6. Hypertension:  Controlled.  7. Hyperlipidemia:  Continue statin.  8. Disposition:  F/u with Dr. Daneen Schick or me in 2-3 weeks.   Signed, Richardson Dopp, PA-C  03/09/2014 2:22 PM

## 2014-03-09 NOTE — Patient Instructions (Signed)
LAB WORK TODAY; BMET, BNP  STOP HCTZ,, TOPROL XL 100, RAMIPRIL   START TOPROL XL 12.5 MG DAILY START LASIX 20 MG ON MON, WED, AND FRI'S START POTASSIUM 20 MEQ ON MON, WED, AND FRI'S  YOU HAVE A FOLLOW UP WITH SCOTT WEAVER, PAC SAME DAY DR. Tamala Julian IS IN THE OFFICE 03/28/14 2 pm

## 2014-03-10 ENCOUNTER — Other Ambulatory Visit: Payer: Self-pay | Admitting: *Deleted

## 2014-03-10 DIAGNOSIS — I10 Essential (primary) hypertension: Secondary | ICD-10-CM

## 2014-03-15 DIAGNOSIS — Z96659 Presence of unspecified artificial knee joint: Secondary | ICD-10-CM | POA: Diagnosis not present

## 2014-03-15 DIAGNOSIS — Z951 Presence of aortocoronary bypass graft: Secondary | ICD-10-CM | POA: Diagnosis not present

## 2014-03-15 DIAGNOSIS — M1710 Unilateral primary osteoarthritis, unspecified knee: Secondary | ICD-10-CM | POA: Diagnosis not present

## 2014-03-15 DIAGNOSIS — I5040 Unspecified combined systolic (congestive) and diastolic (congestive) heart failure: Secondary | ICD-10-CM | POA: Diagnosis not present

## 2014-03-15 DIAGNOSIS — I2510 Atherosclerotic heart disease of native coronary artery without angina pectoris: Secondary | ICD-10-CM | POA: Diagnosis not present

## 2014-03-15 DIAGNOSIS — I10 Essential (primary) hypertension: Secondary | ICD-10-CM | POA: Diagnosis not present

## 2014-03-15 DIAGNOSIS — Z471 Aftercare following joint replacement surgery: Secondary | ICD-10-CM | POA: Diagnosis not present

## 2014-03-16 ENCOUNTER — Telehealth: Payer: Self-pay | Admitting: Interventional Cardiology

## 2014-03-16 DIAGNOSIS — Z471 Aftercare following joint replacement surgery: Secondary | ICD-10-CM | POA: Diagnosis not present

## 2014-03-16 DIAGNOSIS — Z951 Presence of aortocoronary bypass graft: Secondary | ICD-10-CM | POA: Diagnosis not present

## 2014-03-16 DIAGNOSIS — I10 Essential (primary) hypertension: Secondary | ICD-10-CM | POA: Diagnosis not present

## 2014-03-16 DIAGNOSIS — Z96659 Presence of unspecified artificial knee joint: Secondary | ICD-10-CM | POA: Diagnosis not present

## 2014-03-16 DIAGNOSIS — I5040 Unspecified combined systolic (congestive) and diastolic (congestive) heart failure: Secondary | ICD-10-CM | POA: Diagnosis not present

## 2014-03-16 DIAGNOSIS — I2510 Atherosclerotic heart disease of native coronary artery without angina pectoris: Secondary | ICD-10-CM | POA: Diagnosis not present

## 2014-03-16 NOTE — Telephone Encounter (Signed)
New Message:  Needs orders for CHF education and medication education for home health.Marland KitchenMarland Kitchen

## 2014-03-17 DIAGNOSIS — I5040 Unspecified combined systolic (congestive) and diastolic (congestive) heart failure: Secondary | ICD-10-CM | POA: Diagnosis not present

## 2014-03-17 DIAGNOSIS — I10 Essential (primary) hypertension: Secondary | ICD-10-CM | POA: Diagnosis not present

## 2014-03-17 DIAGNOSIS — Z96659 Presence of unspecified artificial knee joint: Secondary | ICD-10-CM | POA: Diagnosis not present

## 2014-03-17 DIAGNOSIS — Z471 Aftercare following joint replacement surgery: Secondary | ICD-10-CM | POA: Diagnosis not present

## 2014-03-17 DIAGNOSIS — Z951 Presence of aortocoronary bypass graft: Secondary | ICD-10-CM | POA: Diagnosis not present

## 2014-03-17 DIAGNOSIS — I2510 Atherosclerotic heart disease of native coronary artery without angina pectoris: Secondary | ICD-10-CM | POA: Diagnosis not present

## 2014-03-20 ENCOUNTER — Other Ambulatory Visit (INDEPENDENT_AMBULATORY_CARE_PROVIDER_SITE_OTHER): Payer: Medicare Other

## 2014-03-20 DIAGNOSIS — I10 Essential (primary) hypertension: Secondary | ICD-10-CM

## 2014-03-20 LAB — BASIC METABOLIC PANEL
BUN: 18 mg/dL (ref 6–23)
CO2: 25 mEq/L (ref 19–32)
Calcium: 10 mg/dL (ref 8.4–10.5)
Chloride: 105 mEq/L (ref 96–112)
Creatinine, Ser: 1.1 mg/dL (ref 0.4–1.5)
GFR: 69.92 mL/min (ref >60.00)
GLUCOSE: 89 mg/dL (ref 70–99)
Potassium: 4.7 mEq/L (ref 3.5–5.1)
Sodium: 141 mEq/L (ref 135–145)

## 2014-03-20 NOTE — Telephone Encounter (Signed)
Routed to Dr. Smith  

## 2014-03-21 ENCOUNTER — Telehealth: Payer: Self-pay | Admitting: *Deleted

## 2014-03-21 DIAGNOSIS — Z471 Aftercare following joint replacement surgery: Secondary | ICD-10-CM | POA: Diagnosis not present

## 2014-03-21 DIAGNOSIS — I2510 Atherosclerotic heart disease of native coronary artery without angina pectoris: Secondary | ICD-10-CM | POA: Diagnosis not present

## 2014-03-21 DIAGNOSIS — Z951 Presence of aortocoronary bypass graft: Secondary | ICD-10-CM | POA: Diagnosis not present

## 2014-03-21 DIAGNOSIS — I10 Essential (primary) hypertension: Secondary | ICD-10-CM | POA: Diagnosis not present

## 2014-03-21 DIAGNOSIS — I5040 Unspecified combined systolic (congestive) and diastolic (congestive) heart failure: Secondary | ICD-10-CM | POA: Diagnosis not present

## 2014-03-21 DIAGNOSIS — Z96659 Presence of unspecified artificial knee joint: Secondary | ICD-10-CM | POA: Diagnosis not present

## 2014-03-21 NOTE — Telephone Encounter (Signed)
okay

## 2014-03-21 NOTE — Telephone Encounter (Signed)
pt notified about lab results with verbal understanding  

## 2014-03-23 DIAGNOSIS — I10 Essential (primary) hypertension: Secondary | ICD-10-CM | POA: Diagnosis not present

## 2014-03-23 DIAGNOSIS — Z471 Aftercare following joint replacement surgery: Secondary | ICD-10-CM | POA: Diagnosis not present

## 2014-03-23 DIAGNOSIS — I2510 Atherosclerotic heart disease of native coronary artery without angina pectoris: Secondary | ICD-10-CM | POA: Diagnosis not present

## 2014-03-23 DIAGNOSIS — I5040 Unspecified combined systolic (congestive) and diastolic (congestive) heart failure: Secondary | ICD-10-CM | POA: Diagnosis not present

## 2014-03-23 DIAGNOSIS — Z951 Presence of aortocoronary bypass graft: Secondary | ICD-10-CM | POA: Diagnosis not present

## 2014-03-23 DIAGNOSIS — Z96659 Presence of unspecified artificial knee joint: Secondary | ICD-10-CM | POA: Diagnosis not present

## 2014-03-24 DIAGNOSIS — Z96659 Presence of unspecified artificial knee joint: Secondary | ICD-10-CM | POA: Diagnosis not present

## 2014-03-24 DIAGNOSIS — I10 Essential (primary) hypertension: Secondary | ICD-10-CM | POA: Diagnosis not present

## 2014-03-24 DIAGNOSIS — Z471 Aftercare following joint replacement surgery: Secondary | ICD-10-CM | POA: Diagnosis not present

## 2014-03-24 DIAGNOSIS — I5040 Unspecified combined systolic (congestive) and diastolic (congestive) heart failure: Secondary | ICD-10-CM | POA: Diagnosis not present

## 2014-03-24 DIAGNOSIS — I2510 Atherosclerotic heart disease of native coronary artery without angina pectoris: Secondary | ICD-10-CM | POA: Diagnosis not present

## 2014-03-24 DIAGNOSIS — Z951 Presence of aortocoronary bypass graft: Secondary | ICD-10-CM | POA: Diagnosis not present

## 2014-03-28 ENCOUNTER — Encounter: Payer: Self-pay | Admitting: Physician Assistant

## 2014-03-28 ENCOUNTER — Ambulatory Visit (INDEPENDENT_AMBULATORY_CARE_PROVIDER_SITE_OTHER): Payer: Medicare Other | Admitting: Physician Assistant

## 2014-03-28 VITALS — BP 112/70 | HR 105 | Ht 69.0 in | Wt 238.0 lb

## 2014-03-28 DIAGNOSIS — I2589 Other forms of chronic ischemic heart disease: Secondary | ICD-10-CM | POA: Diagnosis not present

## 2014-03-28 DIAGNOSIS — I5040 Unspecified combined systolic (congestive) and diastolic (congestive) heart failure: Secondary | ICD-10-CM

## 2014-03-28 DIAGNOSIS — I4891 Unspecified atrial fibrillation: Secondary | ICD-10-CM | POA: Diagnosis not present

## 2014-03-28 DIAGNOSIS — I2510 Atherosclerotic heart disease of native coronary artery without angina pectoris: Secondary | ICD-10-CM

## 2014-03-28 DIAGNOSIS — I10 Essential (primary) hypertension: Secondary | ICD-10-CM

## 2014-03-28 DIAGNOSIS — I359 Nonrheumatic aortic valve disorder, unspecified: Secondary | ICD-10-CM

## 2014-03-28 DIAGNOSIS — E785 Hyperlipidemia, unspecified: Secondary | ICD-10-CM

## 2014-03-28 MED ORDER — FUROSEMIDE 20 MG PO TABS: 20.0000 mg | ORAL_TABLET | ORAL | Status: DC

## 2014-03-28 MED ORDER — APIXABAN 5 MG PO TABS: 5.0000 mg | ORAL_TABLET | Freq: Two times a day (BID) | ORAL | Status: DC

## 2014-03-28 MED ORDER — POTASSIUM CHLORIDE CRYS ER 20 MEQ PO TBCR: 20.0000 meq | EXTENDED_RELEASE_TABLET | ORAL | Status: DC

## 2014-03-28 MED ORDER — ROSUVASTATIN CALCIUM 40 MG PO TABS: 40.0000 mg | ORAL_TABLET | Freq: Every day | ORAL | Status: DC

## 2014-03-28 MED ORDER — METOPROLOL SUCCINATE ER 25 MG PO TB24: 25.0000 mg | ORAL_TABLET | Freq: Every day | ORAL | Status: DC

## 2014-03-28 NOTE — Progress Notes (Signed)
Aaron Berg, [place], [place]  [address] Phone: [phone] Fax:  [phone]  Date:  03/28/2014   ID:  Aaron Berg, DOB 1936-03-13, MRN [id number]  PCP:  Wyatt Haste, MD  Cardiologist:  Dr. Daneen Schick     History of Present Illness: Aaron Berg is a 78 y.o. male with a history of CAD s/p CABG, Ischemic cardiomyopathy, combined systolic and diastolic CHF, aortic stenosis, HTN, HL.    Patient underwent left knee replacement in January and then right knee replacement in February.  I saw him 03/09/14. He had been staying at [facility] for rehabilitation after his surgery. Patient was noted to be significantly hypotensive. All of his medications had been stopped. He was symptomatic with this. I placed the patient back on low-dose Lasix. I also placed him back on low-dose Toprol. He turns today for follow up for further medication adjustments.  He is now at home and has [facility] coming to his house.  He has chronic dizziness (for years) that has been dx as BPPV.  He denies significant dyspnea.  No orthopnea, PND.  LE edema is stable.  No syncope.  No palpitations.  Studies:  - LHC (03/07/11):  EF 30-40% distal left main occluded, LAD occluded, circumflex occluded, ostial RCA occluded; LIMA-LAD patent, SVG-circumflex patent with ostial 50%, SVG-RCA patent  - Echo (04/20/13):  EF 40-45%, mild LVH, mild to moderate aortic root dilatation (4.3 cm), grade 2 diastolic dysfunction, mild AS (Mean gradient 12), trivial TR  - Nuclear (02/28/11):  Anterior, anteroseptal and apical anterior ischemia; inferoseptal, inferior and apical infarct with mild peri-infarct ischemia, EF 39%   Recent Labs: 10/05/2013: ALT 17; HDL Cholesterol 53; LDL (calc) 78  02/02/2014: Hemoglobin 8.3*  03/09/2014: Pro B Natriuretic peptide (BNP) 255.0*  03/20/2014: Creatinine 1.1; Potassium 4.7   Wt Readings from Last 3 Encounters:  03/28/14 238 lb (107.956 kg)  03/09/14 243 lb (110.224 kg)    03/08/14 241 lb (109.317 kg)     Past Medical History  Diagnosis Date  . ASHD (arteriosclerotic heart disease)   . Dyslipidemia     well controlled  . Obesity   . Arthritis   . Male hypogonadism   . Gait disorder   . Hypertension   . Dysrhythmia 2003    AFib after CABG; warfarin x 6 months  . Sleep apnea     cpap  . CHF (congestive heart failure)   . Dry eyes   . Complication of anesthesia     "woke up too soon"  . Depression   . COPD (chronic obstructive pulmonary disease)     "no signs or tests"  . Headache(784.0)     silent migraines- no pain just go blind for just a minute  . Cancer     skin  . Memory loss, short term 2/15    per daughter since last surgery and placement at [facility].  "Hallucinations have stopped"    Current Outpatient Prescriptions  Medication Sig Dispense Refill  . acetaminophen (TYLENOL) 325 MG tablet Take 650 mg by mouth every 4 (four) hours as needed for mild pain or moderate pain.      . cholecalciferol (VITAMIN D) 1000 UNITS tablet Take 1,000 Units by mouth daily.      . clopidogrel (PLAVIX) 75 MG tablet Take 75 mg by mouth daily with breakfast. For DVT/Prophyaxis      . docusate sodium (COLACE) 100 MG capsule Take 100 mg by mouth 2 (two)  times daily. For constipation      . ferrous fumarate (HEMOCYTE - 106 MG FE) 325 (106 FE) MG TABS tablet Take 1 tablet by mouth.      . furosemide (LASIX) 20 MG tablet Take 1 tablet (20 mg total) by mouth as directed. Take 20 mg on Mon, Wed, and Fri's      . methocarbamol (ROBAXIN) 500 MG tablet Take 500 mg by mouth every 6 (six) hours as needed for muscle spasms.      . metoprolol succinate (TOPROL-XL) 25 MG 24 hr tablet Take 0.5 tablets (12.5 mg total) by mouth daily.      . Multiple Vitamins-Minerals (MULTIVITAMIN WITH MINERALS) tablet Take 1 tablet by mouth daily.      . polyethylene glycol (MIRALAX / GLYCOLAX) packet Take 17 g by mouth daily. Mix with 6-8 oz of liquid daily. For constipation.      .  potassium chloride SA (K-DUR,KLOR-CON) 20 MEQ tablet Take 1 tablet (20 mEq total) by mouth as directed. Take 20 meq on Mon, Wed, and Fri's  90 tablet  3  . rosuvastatin (CRESTOR) 40 MG tablet Take 40 mg by mouth daily with breakfast. For Hyperlipdemia      . Testosterone (ANDROGEL) 20.25 MG/1.25GM (1.62%) GEL Apply contents of 1 packet=1.25gm topically to each shoulder daily for hormone therapy  1.25 g  5  . traMADol (ULTRAM) 50 MG tablet Take 1-2 tablets (50-100 mg total) by mouth every 6 (six) hours as needed. Per pt daughter, may have 50 mg additional every 6 hrs if needed  80 tablet  0   No current facility-administered medications for this visit.    Allergies:   Procardia   Social History:  The patient  reports that he quit smoking about 24 years ago. His smoking use included Cigarettes. He smoked 0.00 packs per day for 30 years. He has never used smokeless tobacco. He reports that he drinks alcohol. He reports that he does not use illicit drugs.   Family History:  The patient's family history includes CAD in his father and mother; Pancreatic cancer in his father.   ROS:  Please see the history of present illness.  No bleeding problems.    All other systems reviewed and negative.   PHYSICAL EXAM: VS:  BP 112/70  Pulse 105  Ht 5\' 9"  (1.753 m)  Wt 238 lb (107.956 kg)  BMI 35.13 kg/m2 Well nourished, well developed, in no acute distress HEENT: normal Neck: no JVDAt 90 Cardiac:  normal S1, S2; irregularly irregular rhythm; 2/6 systolic crescendo-decrescendo murmur@RUSB  Lungs:  clear to auscultation bilaterally, no wheezing, rhonchi or rales Abd: soft, nontender, no hepatomegaly Ext: trace bilateral LE edema Skin: warm and dry Neuro:  CNs 2-12 intact, no focal abnormalities noted  EKG:  Atrial fibrillation, HR 105, PVCs     ASSESSMENT AND PLAN:  1. Atrial Fibrillation with RVR:  When last seen, I noted atrial fibrillation in the notes from [facility]. However, this had not been  documented since after his bypass surgery.  In any event, he is now in AFib.  CHADS2-VASc=4.  He requires long term anticoagulation. I reviewed this with the patient today.  He is largely asymptomatic.  We can try to restore NSR at least once.  We can consider DCCV after 3 weeks of uninterrupted anticoagulation.  If he fails this, we will need to consider rate vs rhythm control.  I will start him on Eliquis 5 mg BID.  Stop Plavix.  Increase Toprol  to 25 mg QD.  2. Chronic Combined Systolic and Diastolic CHF:  Volume appears stable.   3. Ischemic Cardiomyopathy:   Continue beta blocker. As blood pressure allows, try to restart ACEI. 4. CAD s/p CABG:  No angina. disContinue Plavix. continue statin. 5. Aortic Stenosis:  Murmur on exam c/w AS.  AS is mild by recent measurement.  No symptoms clinically to suggest worsening. 6. Hypertension:  Controlled.  7. Hyperlipidemia:  Continue statin.  8. Disposition:  F/u with  Dr. Daneen Schick as planned.   Signed, Richardson Dopp, PA-C  03/28/2014 2:48 PM

## 2014-03-28 NOTE — Telephone Encounter (Signed)
returned Estill Bamberg call.lmom verbal auth per Dr.Smith for pt to receive CHF/Medication education form home health.she is to call back if additional assist is needed

## 2014-03-28 NOTE — Patient Instructions (Addendum)
Your physician has recommended you make the following change in your medication:   1. Stop Plavix.  2. Start Eliquis 5 mg 1 tablet by mouth daily. 3 weeks of samples given to you, along with 30 day free trial card.   3. Increase Toprol Xl 25 mg to 1 tablet daily.   Your physician recommends that you return for lab work on 04/11/14, which is the day you see Dr. Tamala Julian, for Bmet and CBC.  Your physician recommends that you keep scheduled follow-up with Dr. Tamala Julian on 04/11/14.

## 2014-03-30 ENCOUNTER — Telehealth: Payer: Self-pay | Admitting: Physician Assistant

## 2014-03-30 DIAGNOSIS — I5040 Unspecified combined systolic (congestive) and diastolic (congestive) heart failure: Secondary | ICD-10-CM | POA: Diagnosis not present

## 2014-03-30 DIAGNOSIS — I2510 Atherosclerotic heart disease of native coronary artery without angina pectoris: Secondary | ICD-10-CM | POA: Diagnosis not present

## 2014-03-30 DIAGNOSIS — Z96659 Presence of unspecified artificial knee joint: Secondary | ICD-10-CM | POA: Diagnosis not present

## 2014-03-30 DIAGNOSIS — I10 Essential (primary) hypertension: Secondary | ICD-10-CM | POA: Diagnosis not present

## 2014-03-30 DIAGNOSIS — Z951 Presence of aortocoronary bypass graft: Secondary | ICD-10-CM | POA: Diagnosis not present

## 2014-03-30 DIAGNOSIS — Z471 Aftercare following joint replacement surgery: Secondary | ICD-10-CM | POA: Diagnosis not present

## 2014-03-30 NOTE — Telephone Encounter (Signed)
LMTCB @ 10:25 am/PE

## 2014-03-30 NOTE — Telephone Encounter (Signed)
New problem    Pt gave verbal permission to speak to his daughter on the phone.   Please give her a call pt and daughter have questions about his medications.

## 2014-03-30 NOTE — Telephone Encounter (Signed)
Spoke with [name], daughter of patient (pt gave permission to speak with his daughter regarding his appointment, care, medications). Patient's daughter wanted an update from yesterday's appt and medication changes. Provided information over phone. Daughter of patient verbalized appreciation and understanding of medication changes.

## 2014-03-31 DIAGNOSIS — I10 Essential (primary) hypertension: Secondary | ICD-10-CM | POA: Diagnosis not present

## 2014-03-31 DIAGNOSIS — Z951 Presence of aortocoronary bypass graft: Secondary | ICD-10-CM | POA: Diagnosis not present

## 2014-03-31 DIAGNOSIS — I2510 Atherosclerotic heart disease of native coronary artery without angina pectoris: Secondary | ICD-10-CM | POA: Diagnosis not present

## 2014-03-31 DIAGNOSIS — Z96659 Presence of unspecified artificial knee joint: Secondary | ICD-10-CM | POA: Diagnosis not present

## 2014-03-31 DIAGNOSIS — Z471 Aftercare following joint replacement surgery: Secondary | ICD-10-CM | POA: Diagnosis not present

## 2014-03-31 DIAGNOSIS — I5040 Unspecified combined systolic (congestive) and diastolic (congestive) heart failure: Secondary | ICD-10-CM | POA: Diagnosis not present

## 2014-04-04 DIAGNOSIS — Z96659 Presence of unspecified artificial knee joint: Secondary | ICD-10-CM | POA: Diagnosis not present

## 2014-04-04 DIAGNOSIS — Z471 Aftercare following joint replacement surgery: Secondary | ICD-10-CM | POA: Diagnosis not present

## 2014-04-04 DIAGNOSIS — I2510 Atherosclerotic heart disease of native coronary artery without angina pectoris: Secondary | ICD-10-CM | POA: Diagnosis not present

## 2014-04-04 DIAGNOSIS — I5040 Unspecified combined systolic (congestive) and diastolic (congestive) heart failure: Secondary | ICD-10-CM | POA: Diagnosis not present

## 2014-04-04 DIAGNOSIS — I10 Essential (primary) hypertension: Secondary | ICD-10-CM | POA: Diagnosis not present

## 2014-04-04 DIAGNOSIS — Z951 Presence of aortocoronary bypass graft: Secondary | ICD-10-CM | POA: Diagnosis not present

## 2014-04-05 ENCOUNTER — Telehealth: Payer: Self-pay | Admitting: *Deleted

## 2014-04-05 DIAGNOSIS — I5040 Unspecified combined systolic (congestive) and diastolic (congestive) heart failure: Secondary | ICD-10-CM | POA: Diagnosis not present

## 2014-04-05 DIAGNOSIS — I10 Essential (primary) hypertension: Secondary | ICD-10-CM | POA: Diagnosis not present

## 2014-04-05 DIAGNOSIS — Z96659 Presence of unspecified artificial knee joint: Secondary | ICD-10-CM | POA: Diagnosis not present

## 2014-04-05 DIAGNOSIS — Z951 Presence of aortocoronary bypass graft: Secondary | ICD-10-CM | POA: Diagnosis not present

## 2014-04-05 DIAGNOSIS — I2510 Atherosclerotic heart disease of native coronary artery without angina pectoris: Secondary | ICD-10-CM | POA: Diagnosis not present

## 2014-04-05 DIAGNOSIS — Z471 Aftercare following joint replacement surgery: Secondary | ICD-10-CM | POA: Diagnosis not present

## 2014-04-05 NOTE — Telephone Encounter (Signed)
Requesting Tramadol Rx be called in, as it helped him while he was in rehab at [facility] - per email request from patient. Routed to [employer].

## 2014-04-05 NOTE — Telephone Encounter (Signed)
Attempted to call patient at home number with no answer. Replied to patient by email as follows: "I have forwarded your request to Dr. Thompson Caul assistant. Have you contacted your Primary Care or Orthopedic physician?  They would be the appropriate health care provider for this request unless the pain is cardiac related.  Thanks!"   Forwarded this additional note to [facility]

## 2014-04-06 DIAGNOSIS — Z951 Presence of aortocoronary bypass graft: Secondary | ICD-10-CM | POA: Diagnosis not present

## 2014-04-06 DIAGNOSIS — I2510 Atherosclerotic heart disease of native coronary artery without angina pectoris: Secondary | ICD-10-CM | POA: Diagnosis not present

## 2014-04-06 DIAGNOSIS — I10 Essential (primary) hypertension: Secondary | ICD-10-CM | POA: Diagnosis not present

## 2014-04-06 DIAGNOSIS — Z471 Aftercare following joint replacement surgery: Secondary | ICD-10-CM | POA: Diagnosis not present

## 2014-04-06 DIAGNOSIS — Z96659 Presence of unspecified artificial knee joint: Secondary | ICD-10-CM | POA: Diagnosis not present

## 2014-04-06 DIAGNOSIS — I5040 Unspecified combined systolic (congestive) and diastolic (congestive) heart failure: Secondary | ICD-10-CM | POA: Diagnosis not present

## 2014-04-11 ENCOUNTER — Other Ambulatory Visit: Payer: Medicare Other

## 2014-04-11 ENCOUNTER — Telehealth: Payer: Self-pay

## 2014-04-11 ENCOUNTER — Ambulatory Visit (INDEPENDENT_AMBULATORY_CARE_PROVIDER_SITE_OTHER): Payer: Medicare Other | Admitting: Interventional Cardiology

## 2014-04-11 ENCOUNTER — Encounter: Payer: Self-pay | Admitting: Interventional Cardiology

## 2014-04-11 VITALS — BP 133/91 | HR 80 | Ht 69.0 in | Wt 242.0 lb

## 2014-04-11 DIAGNOSIS — J449 Chronic obstructive pulmonary disease, unspecified: Secondary | ICD-10-CM

## 2014-04-11 DIAGNOSIS — I2510 Atherosclerotic heart disease of native coronary artery without angina pectoris: Secondary | ICD-10-CM | POA: Diagnosis not present

## 2014-04-11 DIAGNOSIS — E785 Hyperlipidemia, unspecified: Secondary | ICD-10-CM

## 2014-04-11 DIAGNOSIS — I4891 Unspecified atrial fibrillation: Secondary | ICD-10-CM

## 2014-04-11 DIAGNOSIS — I10 Essential (primary) hypertension: Secondary | ICD-10-CM

## 2014-04-11 MED ORDER — AMIODARONE HCL 200 MG PO TABS: 200.0000 mg | ORAL_TABLET | Freq: Two times a day (BID) | ORAL | Status: DC

## 2014-04-11 NOTE — Telephone Encounter (Signed)
pt daughter aware dccv scheduled for 04/26/14 @12 :15pm.pt dtr given verbal instructions.she rqst copy of instructions be mailed to pt [place] address [address] [place], [place] [address].

## 2014-04-11 NOTE — Patient Instructions (Signed)
Your physician has recommended you make the following change in your medication:  1) START Amiodarone 200mg  twice daily an Rx has been sent to your pharmacy  Your physician has requested that you have an echocardiogram. Echocardiography is a painless test that uses sound waves to create images of your heart. It provides your doctor with information about the size and shape of your heart and how well your heart's chambers and valves are working. This procedure takes approximately one hour. There are no restrictions for this procedure.(To be scheduled within the next week)  Your physician recommends that you return for lab work same day as echo (bmet)  Your physician has recommended that you have a Cardioversion (DCCV). Electrical Cardioversion uses a jolt of electricity to your heart either through paddles or wired patches attached to your chest. This is a controlled, usually prescheduled, procedure. Defibrillation is done under light anesthesia in the hospital, and you usually go home the day of the procedure. This is done to get your heart back into a normal rhythm. You are not awake for the procedure. Please see the instruction sheet given to you today.( To be scheduled 2 weeks after starting Amiodarone)  Your physician recommends that you schedule a follow-up appointment in: 3 weeks

## 2014-04-11 NOTE — Progress Notes (Signed)
Patient ID: Aaron Berg, male   DOB: [birth year], 78 y.o.   MRN: [id number]    [address], [address] [place], [place]  [address] Phone: [phone] Fax:  [phone]  Date:  04/11/2014   ID:  Aaron Berg, DOB 12-09-1936, MRN [id number]  PCP:  Wyatt Haste, MD   ASSESSMENT:  1. Atrial fibrillation, persistent, with poor rate control  2. CAD, stable without angina 3. Chronic anticoagulation therapy, Eliquis 4. Chronic diastolic heart failure, without evidence of  PLAN:  1. Amiodarone 200 mg twice a day 2. Electrical cardioversion 2 weeks after starting amiodarone 3. 2-D Doppler echocardiogram   SUBJECTIVE: Aaron Berg is a 78 y.o. male who was recently noted to be in atrial fibrillation with rapid ventricular response. Anticoagulation therapy has been started. No bleeding complications. The patient is asymptomatic. Last LVEF was 45% one year ago. He denies orthopnea, PND, chest pain. He is out of energy. He has not had syncope.   Wt Readings from Last 3 Encounters:  04/11/14 242 lb (109.77 kg)  03/28/14 238 lb (107.956 kg)  03/09/14 243 lb (110.224 kg)     Past Medical History  Diagnosis Date  . ASHD (arteriosclerotic heart disease)   . Dyslipidemia     well controlled  . Obesity   . Arthritis   . Male hypogonadism   . Gait disorder   . Hypertension   . Dysrhythmia 2003    AFib after CABG; warfarin x 6 months  . Sleep apnea     cpap  . CHF (congestive heart failure)   . Dry eyes   . Complication of anesthesia     "woke up too soon"  . Depression   . COPD (chronic obstructive pulmonary disease)     "no signs or tests"  . Headache(784.0)     silent migraines- no pain just go blind for just a minute  . Cancer     skin  . Memory loss, short term 2/15    per daughter since last surgery and placement at [facility].  "Hallucinations have stopped"    Current Outpatient Prescriptions  Medication Sig Dispense Refill  . acetaminophen (TYLENOL)  325 MG tablet Take 650 mg by mouth every 4 (four) hours as needed for mild pain or moderate pain.      [name] apixaban (ELIQUIS) 5 MG TABS tablet Take 1 tablet (5 mg total) by mouth 2 (two) times daily.  60 tablet  6  . cholecalciferol (VITAMIN D) 1000 UNITS tablet 2000 ius daily      . furosemide (LASIX) 20 MG tablet Take 1 tablet (20 mg total) by mouth as directed. Take 20 mg on Mon, Wed, and Fri's  40 tablet  1  . metoprolol succinate (TOPROL-XL) 25 MG 24 hr tablet Take 1 tablet (25 mg total) by mouth daily.  90 tablet  1  . Multiple Vitamins-Minerals (MULTIVITAMIN WITH MINERALS) tablet Take 1 tablet by mouth daily.      . potassium chloride SA (K-DUR,KLOR-CON) 20 MEQ tablet Take 1 tablet (20 mEq total) by mouth as directed. Take 20 meq on Mon, Wed, and Fri's  90 tablet  1  . rosuvastatin (CRESTOR) 40 MG tablet Take 1 tablet (40 mg total) by mouth daily with breakfast. For Hyperlipdemia  90 tablet  1  . Testosterone (ANDROGEL) 20.25 MG/1.25GM (1.62%) GEL Apply contents of 1 packet=1.25gm topically to each shoulder daily for hormone therapy  1.25 g  5  . ferrous fumarate (HEMOCYTE -  106 MG FE) 325 (106 FE) MG TABS tablet Take 1 tablet by mouth.       No current facility-administered medications for this visit.    Allergies:    Allergies  Allergen Reactions  . Procardia [Nifedipine] Rash    All over body    Social History:  The patient  reports that he quit smoking about 24 years ago. His smoking use included Cigarettes. He smoked 0.00 packs per day for 30 years. He has never used smokeless tobacco. He reports that he drinks alcohol. He reports that he does not use illicit drugs.   ROS:  Please see the history of present illness.   No blood in urine or stool. No transient neurological complaints.   All other systems reviewed and negative.   OBJECTIVE: VS:  BP 133/91  Pulse 80  Ht 5\' 9"  (1.753 m)  Wt 242 lb (109.77 kg)  BMI 35.72 kg/m2 Well nourished, well developed, in no acute distress,  elderly and obese  HEENT: normal Neck: JVD flat. Carotid bruit absent  Cardiac:  normal S1, S2; IIRR; no murmur Lungs:  clear to auscultation bilaterally, no wheezing, rhonchi or rales Abd: soft, nontender, no hepatomegaly Ext: Edema absent. Pulses 2+ and irregularly irregular  Skin: warm and dry Neuro:  CNs 2-12 intact, no focal abnormalities noted  EKG:  Not repeated       Signed, Illene Labrador III, MD 04/11/2014 10:51 AM

## 2014-04-12 DIAGNOSIS — Z96659 Presence of unspecified artificial knee joint: Secondary | ICD-10-CM | POA: Diagnosis not present

## 2014-04-12 DIAGNOSIS — I2510 Atherosclerotic heart disease of native coronary artery without angina pectoris: Secondary | ICD-10-CM | POA: Diagnosis not present

## 2014-04-12 DIAGNOSIS — I10 Essential (primary) hypertension: Secondary | ICD-10-CM | POA: Diagnosis not present

## 2014-04-12 DIAGNOSIS — Z471 Aftercare following joint replacement surgery: Secondary | ICD-10-CM | POA: Diagnosis not present

## 2014-04-12 DIAGNOSIS — I5040 Unspecified combined systolic (congestive) and diastolic (congestive) heart failure: Secondary | ICD-10-CM | POA: Diagnosis not present

## 2014-04-12 DIAGNOSIS — Z951 Presence of aortocoronary bypass graft: Secondary | ICD-10-CM | POA: Diagnosis not present

## 2014-04-13 ENCOUNTER — Other Ambulatory Visit: Payer: Self-pay | Admitting: Interventional Cardiology

## 2014-04-13 DIAGNOSIS — I2510 Atherosclerotic heart disease of native coronary artery without angina pectoris: Secondary | ICD-10-CM | POA: Diagnosis not present

## 2014-04-13 DIAGNOSIS — I5040 Unspecified combined systolic (congestive) and diastolic (congestive) heart failure: Secondary | ICD-10-CM | POA: Diagnosis not present

## 2014-04-13 DIAGNOSIS — Z471 Aftercare following joint replacement surgery: Secondary | ICD-10-CM | POA: Diagnosis not present

## 2014-04-13 DIAGNOSIS — I4891 Unspecified atrial fibrillation: Secondary | ICD-10-CM

## 2014-04-13 DIAGNOSIS — Z951 Presence of aortocoronary bypass graft: Secondary | ICD-10-CM | POA: Diagnosis not present

## 2014-04-13 DIAGNOSIS — I10 Essential (primary) hypertension: Secondary | ICD-10-CM | POA: Diagnosis not present

## 2014-04-13 DIAGNOSIS — Z96659 Presence of unspecified artificial knee joint: Secondary | ICD-10-CM | POA: Diagnosis not present

## 2014-04-14 DIAGNOSIS — Z471 Aftercare following joint replacement surgery: Secondary | ICD-10-CM | POA: Diagnosis not present

## 2014-04-14 DIAGNOSIS — I5040 Unspecified combined systolic (congestive) and diastolic (congestive) heart failure: Secondary | ICD-10-CM | POA: Diagnosis not present

## 2014-04-14 DIAGNOSIS — Z951 Presence of aortocoronary bypass graft: Secondary | ICD-10-CM | POA: Diagnosis not present

## 2014-04-14 DIAGNOSIS — I2510 Atherosclerotic heart disease of native coronary artery without angina pectoris: Secondary | ICD-10-CM | POA: Diagnosis not present

## 2014-04-14 DIAGNOSIS — Z96659 Presence of unspecified artificial knee joint: Secondary | ICD-10-CM | POA: Diagnosis not present

## 2014-04-14 DIAGNOSIS — I10 Essential (primary) hypertension: Secondary | ICD-10-CM | POA: Diagnosis not present

## 2014-04-19 DIAGNOSIS — I5040 Unspecified combined systolic (congestive) and diastolic (congestive) heart failure: Secondary | ICD-10-CM | POA: Diagnosis not present

## 2014-04-19 DIAGNOSIS — Z951 Presence of aortocoronary bypass graft: Secondary | ICD-10-CM | POA: Diagnosis not present

## 2014-04-19 DIAGNOSIS — Z471 Aftercare following joint replacement surgery: Secondary | ICD-10-CM | POA: Diagnosis not present

## 2014-04-19 DIAGNOSIS — Z96659 Presence of unspecified artificial knee joint: Secondary | ICD-10-CM | POA: Diagnosis not present

## 2014-04-19 DIAGNOSIS — I10 Essential (primary) hypertension: Secondary | ICD-10-CM | POA: Diagnosis not present

## 2014-04-19 DIAGNOSIS — I2510 Atherosclerotic heart disease of native coronary artery without angina pectoris: Secondary | ICD-10-CM | POA: Diagnosis not present

## 2014-04-20 ENCOUNTER — Other Ambulatory Visit (INDEPENDENT_AMBULATORY_CARE_PROVIDER_SITE_OTHER): Payer: Medicare Other

## 2014-04-20 ENCOUNTER — Other Ambulatory Visit (HOSPITAL_COMMUNITY): Payer: Medicare Other | Admitting: *Deleted

## 2014-04-20 ENCOUNTER — Ambulatory Visit (HOSPITAL_COMMUNITY): Payer: Medicare Other | Attending: Interventional Cardiology | Admitting: Radiology

## 2014-04-20 DIAGNOSIS — I10 Essential (primary) hypertension: Secondary | ICD-10-CM | POA: Diagnosis not present

## 2014-04-20 DIAGNOSIS — I4891 Unspecified atrial fibrillation: Secondary | ICD-10-CM

## 2014-04-20 DIAGNOSIS — Z951 Presence of aortocoronary bypass graft: Secondary | ICD-10-CM | POA: Diagnosis not present

## 2014-04-20 DIAGNOSIS — Z96659 Presence of unspecified artificial knee joint: Secondary | ICD-10-CM | POA: Diagnosis not present

## 2014-04-20 DIAGNOSIS — I2510 Atherosclerotic heart disease of native coronary artery without angina pectoris: Secondary | ICD-10-CM | POA: Diagnosis not present

## 2014-04-20 DIAGNOSIS — I359 Nonrheumatic aortic valve disorder, unspecified: Secondary | ICD-10-CM

## 2014-04-20 DIAGNOSIS — Z471 Aftercare following joint replacement surgery: Secondary | ICD-10-CM | POA: Diagnosis not present

## 2014-04-20 DIAGNOSIS — I5040 Unspecified combined systolic (congestive) and diastolic (congestive) heart failure: Secondary | ICD-10-CM | POA: Diagnosis not present

## 2014-04-20 LAB — BASIC METABOLIC PANEL
BUN: 18 mg/dL (ref 6–23)
CALCIUM: 10.1 mg/dL (ref 8.4–10.5)
CO2: 25 mEq/L (ref 19–32)
Chloride: 108 mEq/L (ref 96–112)
Creatinine, Ser: 1.1 mg/dL (ref 0.4–1.5)
GFR: 67.74 mL/min (ref >60.00)
GLUCOSE: 107 mg/dL — AB (ref 70–99)
Potassium: 3.7 mEq/L (ref 3.5–5.1)
SODIUM: 141 meq/L (ref 135–145)

## 2014-04-20 MED ORDER — PERFLUTREN PROTEIN A MICROSPH IV SUSP
3.0000 mL | Freq: Once | INTRAVENOUS | Status: AC
  Administered 2014-04-20: 3 mL via INTRAVENOUS

## 2014-04-20 NOTE — Progress Notes (Signed)
Echocardiogram performed with optison.

## 2014-04-21 DIAGNOSIS — I10 Essential (primary) hypertension: Secondary | ICD-10-CM | POA: Diagnosis not present

## 2014-04-21 DIAGNOSIS — Z471 Aftercare following joint replacement surgery: Secondary | ICD-10-CM | POA: Diagnosis not present

## 2014-04-21 DIAGNOSIS — I2510 Atherosclerotic heart disease of native coronary artery without angina pectoris: Secondary | ICD-10-CM | POA: Diagnosis not present

## 2014-04-21 DIAGNOSIS — Z96659 Presence of unspecified artificial knee joint: Secondary | ICD-10-CM | POA: Diagnosis not present

## 2014-04-21 DIAGNOSIS — I5040 Unspecified combined systolic (congestive) and diastolic (congestive) heart failure: Secondary | ICD-10-CM | POA: Diagnosis not present

## 2014-04-21 DIAGNOSIS — Z951 Presence of aortocoronary bypass graft: Secondary | ICD-10-CM | POA: Diagnosis not present

## 2014-04-24 ENCOUNTER — Encounter (HOSPITAL_COMMUNITY): Payer: Self-pay | Admitting: Pharmacy Technician

## 2014-04-26 ENCOUNTER — Ambulatory Visit (HOSPITAL_COMMUNITY): Payer: Medicare Other | Admitting: Certified Registered Nurse Anesthetist

## 2014-04-26 ENCOUNTER — Telehealth: Payer: Self-pay | Admitting: Interventional Cardiology

## 2014-04-26 ENCOUNTER — Encounter (HOSPITAL_COMMUNITY)
Admission: RE | Disposition: A | Discharge status: Home / Self Care | Payer: Self-pay | Source: Ambulatory Visit | Attending: Interventional Cardiology

## 2014-04-26 ENCOUNTER — Encounter (HOSPITAL_COMMUNITY): Payer: Medicare Other | Admitting: Certified Registered Nurse Anesthetist

## 2014-04-26 ENCOUNTER — Ambulatory Visit (HOSPITAL_COMMUNITY)
Admission: RE | Admit: 2014-04-26 | Discharge: 2014-04-26 | Disposition: A | Discharge status: Home / Self Care | Payer: Medicare Other | Source: Ambulatory Visit | Attending: Interventional Cardiology | Admitting: Interventional Cardiology

## 2014-04-26 ENCOUNTER — Encounter (HOSPITAL_COMMUNITY): Payer: Self-pay | Admitting: *Deleted

## 2014-04-26 DIAGNOSIS — Z87891 Personal history of nicotine dependence: Secondary | ICD-10-CM | POA: Insufficient documentation

## 2014-04-26 DIAGNOSIS — G4730 Sleep apnea, unspecified: Secondary | ICD-10-CM | POA: Diagnosis not present

## 2014-04-26 DIAGNOSIS — F1010 Alcohol abuse, uncomplicated: Secondary | ICD-10-CM

## 2014-04-26 DIAGNOSIS — I4891 Unspecified atrial fibrillation: Secondary | ICD-10-CM | POA: Insufficient documentation

## 2014-04-26 DIAGNOSIS — I10 Essential (primary) hypertension: Secondary | ICD-10-CM | POA: Diagnosis not present

## 2014-04-26 DIAGNOSIS — I509 Heart failure, unspecified: Secondary | ICD-10-CM | POA: Diagnosis not present

## 2014-04-26 DIAGNOSIS — J4489 Other specified chronic obstructive pulmonary disease: Secondary | ICD-10-CM | POA: Insufficient documentation

## 2014-04-26 DIAGNOSIS — J449 Chronic obstructive pulmonary disease, unspecified: Secondary | ICD-10-CM | POA: Insufficient documentation

## 2014-04-26 HISTORY — PX: CARDIOVERSION: SHX1299

## 2014-04-26 SURGERY — CARDIOVERSION
Anesthesia: Monitor Anesthesia Care

## 2014-04-26 MED ORDER — PROPOFOL 10 MG/ML IV BOLUS
INTRAVENOUS | Status: DC | PRN
  Administered 2014-04-26: 50 mg via INTRAVENOUS
  Administered 2014-04-26: 40 mg via INTRAVENOUS

## 2014-04-26 MED ORDER — SODIUM CHLORIDE 0.9 % IJ SOLN: 3.0000 mL | INTRAMUSCULAR | Status: DC | PRN

## 2014-04-26 MED ORDER — SODIUM CHLORIDE 0.9 % IJ SOLN: 3.0000 mL | Freq: Two times a day (BID) | INTRAMUSCULAR | Status: DC

## 2014-04-26 MED ORDER — SODIUM CHLORIDE 0.9 % IV SOLN
250.0000 mL | INTRAVENOUS | Status: DC
  Administered 2014-04-26: 500 mL via INTRAVENOUS
  Administered 2014-04-26: 12:00:00 via INTRAVENOUS

## 2014-04-26 NOTE — Telephone Encounter (Signed)
Three unsuccessful electrical shocks.He had initial conversion with return to AF after 30-90 seconds. Therefore we need to stay on current meds and reschedule elective cardioversion for 4 weeks.

## 2014-04-26 NOTE — Telephone Encounter (Signed)
Aaron Berg from [facility] needed order to continue following Mr. Pound for 1 visit weekly x 3 weeks in follow up from cardioversion.  Gave her order to continue care.  Will call if needs further information.

## 2014-04-26 NOTE — Discharge Instructions (Signed)
Electrical Cardioversion °Electrical cardioversion is the delivery of a jolt of electricity to change the rhythm of the heart. Sticky patches or metal paddles are placed on the chest to deliver the electricity from a device. This is done to restore a normal rhythm. A rhythm that is too fast or not regular keeps the heart from pumping well. °Electrical cardioversion is done in an emergency if:  °· There is low or no blood pressure as a result of the heart rhythm.   °· Normal rhythm must be restored as fast as possible to protect the brain and heart from further damage.   °· It may save a life. °Cardioversion may be done for heart rhythms that are not immediately life-threatening, such as atrial fibrillation or flutter, in which:  °· The heart is beating too fast or is not regular.   °· Medicine to change the rhythm has not worked.   °· It is safe to wait in order to allow time for preparation. °· Symptoms of the abnormal rhythm are bothersome. °· The risk of stroke and other serious complications can be reduced. °LET YOUR CAREGIVER KNOW ABOUT:  °· All medicines you are taking, including vitamins, herbs, eye drops, creams, and over-the-counter medicines.   °· Previous problems you or members of your family have had with the use of anesthetics.   °· Any blood disorders you have.   °· Previous surgeries you have had.   °· Medical conditions you have. °RISKS AND COMPLICATIONS  °Generally, this is a safe procedure. However, as with any procedure, complications can occur. Possible complications include:  °· Breathing problems related to the anesthetic used. °· Cardiac arrest This risk is rare. °· A blood clot that breaks free and travels to other parts of your body. This could cause a stroke or other problems. The risk of this is lowered by use of blood thinning medicine (anticoagulant) prior to the procedure. °BEFORE THE PROCEDURE  °· You may have tests to detect blood clots in your heart and evaluate heart  function.  °· You may start taking anticoagulants so your blood does not clot as easily.   °· Medicines may be given to help stabilize your heart rate and rhythm. °PROCEDURE °· You will be given medicine through an IV tube to reduce discomfort and make you sleepy (sedative).   °· An electrical shock will be delivered. °AFTER THE PROCEDURE °Your heart rhythm will be watched to make sure it does not change. You may be able to go home within a few hours.  °Document Released: 11/21/2002 Document Revised: 09/21/2013 Document Reviewed: 06/15/2013 °ExitCare® Patient Information ©2014 [employer]. ° °

## 2014-04-26 NOTE — Anesthesia Preprocedure Evaluation (Signed)
Anesthesia Evaluation   Patient awake    Reviewed: Allergy & Precautions, H&P , NPO status , Patient's Chart, lab work & pertinent test results, reviewed documented beta blocker date and time   Airway Mallampati: II      Dental  (+) Teeth Intact   Pulmonary sleep apnea , COPDformer smoker,          Cardiovascular hypertension, Pt. on medications Rhythm:Irregular     Neuro/Psych    GI/Hepatic   Endo/Other    Renal/GU      Musculoskeletal   Abdominal Normal abdominal exam  (+)   Peds  Hematology   Anesthesia Other Findings   Reproductive/Obstetrics                           Anesthesia Physical Anesthesia Plan  ASA: III  Anesthesia Plan: General   Post-op Pain Management:    Induction: Intravenous  Airway Management Planned:   Additional Equipment:   Intra-op Plan:   Post-operative Plan:   Informed Consent:   Plan Discussed with:   Anesthesia Plan Comments:         Anesthesia Quick Evaluation

## 2014-04-26 NOTE — CV Procedure (Signed)
Electrical Cardioversion Procedure Note Aaron Berg [id number] 1936-06-26  Procedure: Electrical Cardioversion Indications:  Atrial Fibrillation  Time Out: Verified patient identification, verified procedure,medications/allergies/relevent history reviewed, required imaging and test results available.  Performed  Procedure Details  The patient was NPO after midnight. Anesthesia was administered at the beside  by Dr.Joslin with 90mg  of propofol.  Cardioversion was done with synchronized biphasic defibrillation with AP pads with 200 watts on three separate occasions.  In each instance NSR occurred but was latter followed by return of Atrial Fibrillation. The patient tolerated the procedure well .  IMPRESSION:  Unsuccessful cardioversion of atrial fibrillation    Aaron Berg 04/26/2014, 12:37 PM

## 2014-04-26 NOTE — Transfer of Care (Signed)
Immediate Anesthesia Transfer of Care Note  Patient: Aaron Berg  Procedure(s) Performed: Procedure(s): CARDIOVERSION (N/A)  Patient Location: PACU  Anesthesia Type:General  Level of Consciousness: awake and alert   Airway & Oxygen Therapy: Patient Spontanous Breathing and Patient connected to nasal cannula oxygen  Post-op Assessment: Report given to PACU RN and Post -op Vital signs reviewed and stable  Post vital signs: Reviewed and stable  Complications: No apparent anesthesia complications

## 2014-04-26 NOTE — Anesthesia Postprocedure Evaluation (Signed)
  Anesthesia Post-op Note  Patient: Aaron Berg  Procedure(s) Performed: Procedure(s): CARDIOVERSION (N/A)  Patient Location: PACU  Anesthesia Type:General  Level of Consciousness: awake, alert  and oriented  Airway and Oxygen Therapy: Patient Spontanous Breathing and Patient connected to nasal cannula oxygen  Post-op Pain: mild  Post-op Assessment: Post-op Vital signs reviewed, Patient's Cardiovascular Status Stable, Respiratory Function Stable, Patent Airway and Pain level controlled  Post-op Vital Signs: stable  Last Vitals:  Filed Vitals:   04/26/14 1305  BP: 120/94  Pulse: 77  Temp:   Resp: 18    Complications: No apparent anesthesia complications

## 2014-04-26 NOTE — Telephone Encounter (Signed)
New message    Cardioversion done today .     Need order for home visit for 1 x 3 weeks - for follow up after cardioversion.

## 2014-04-26 NOTE — Anesthesia Postprocedure Evaluation (Signed)
  Anesthesia Post-op Note  Patient: Aaron Berg  Procedure(s) Performed: Procedure(s): CARDIOVERSION (N/A)  Patient Location: PACU and Endoscopy Unit  Anesthesia Type:General  Level of Consciousness: awake and alert   Airway and Oxygen Therapy: Patient Spontanous Breathing and Patient connected to nasal cannula oxygen  Post-op Pain: none  Post-op Assessment: Post-op Vital signs reviewed  Post-op Vital Signs: Reviewed and stable  Last Vitals:  Filed Vitals:   04/26/14 1243  BP: 114/84  Temp:   Resp: 21    Complications: No apparent anesthesia complications

## 2014-04-27 ENCOUNTER — Telehealth: Payer: Self-pay | Admitting: Interventional Cardiology

## 2014-04-27 ENCOUNTER — Encounter (HOSPITAL_COMMUNITY): Payer: Self-pay | Admitting: Interventional Cardiology

## 2014-04-27 DIAGNOSIS — Z951 Presence of aortocoronary bypass graft: Secondary | ICD-10-CM | POA: Diagnosis not present

## 2014-04-27 DIAGNOSIS — I10 Essential (primary) hypertension: Secondary | ICD-10-CM | POA: Diagnosis not present

## 2014-04-27 DIAGNOSIS — Z96659 Presence of unspecified artificial knee joint: Secondary | ICD-10-CM | POA: Diagnosis not present

## 2014-04-27 DIAGNOSIS — Z471 Aftercare following joint replacement surgery: Secondary | ICD-10-CM | POA: Diagnosis not present

## 2014-04-27 DIAGNOSIS — I5040 Unspecified combined systolic (congestive) and diastolic (congestive) heart failure: Secondary | ICD-10-CM | POA: Diagnosis not present

## 2014-04-27 DIAGNOSIS — I2510 Atherosclerotic heart disease of native coronary artery without angina pectoris: Secondary | ICD-10-CM | POA: Diagnosis not present

## 2014-04-27 NOTE — Telephone Encounter (Signed)
Message copied by Lamar Laundry on Thu Apr 27, 2014 10:04 AM ------      Message from: Daneen Schick      Created: Wed Apr 26, 2014  8:03 AM       Labs are okay ------

## 2014-04-27 NOTE — Telephone Encounter (Signed)
Message copied by Lamar Laundry on Thu Apr 27, 2014 10:01 AM ------      Message from: Daneen Schick      Created: Wed Apr 26, 2014  8:14 AM       Dilated LA and mild decrease in heart strength. EF 45% ------

## 2014-04-27 NOTE — Telephone Encounter (Signed)
New Message:  Estill Bamberg states the pt has had a 6 lb wt gain since yesterday. She also states he takes lasix on Mon, wed, and fri... States he did not have lasix yesterday b/c of his procedure.Estill Bamberg is asking if he should take his lasix today and tomorrow

## 2014-04-27 NOTE — Telephone Encounter (Signed)
Spoke with home care nurse.  States patient had cardioversion yesterday with some IV fluids and did not take his scheduled dose of lasix. He takes mon-wed-fri only.  He is not SOB, he has unchanged LE edema. Instructed that he take the Wednesday dose right now, then take usual dose tomorrow. Also instructed that he continue to monitor daily weights. Nurse verbalizes understanding and agreement.

## 2014-04-27 NOTE — Telephone Encounter (Signed)
pt aware of lab results   Labs are okay, and echo results Dilated LA and mild decrease in heart strength. EF 45%.pt adv to keep upcoming appt with Dr.Smith sch for 5/19.pt verbalized understanding.

## 2014-04-28 DIAGNOSIS — I2510 Atherosclerotic heart disease of native coronary artery without angina pectoris: Secondary | ICD-10-CM | POA: Diagnosis not present

## 2014-04-28 DIAGNOSIS — Z951 Presence of aortocoronary bypass graft: Secondary | ICD-10-CM | POA: Diagnosis not present

## 2014-04-28 DIAGNOSIS — Z96659 Presence of unspecified artificial knee joint: Secondary | ICD-10-CM | POA: Diagnosis not present

## 2014-04-28 DIAGNOSIS — I5040 Unspecified combined systolic (congestive) and diastolic (congestive) heart failure: Secondary | ICD-10-CM | POA: Diagnosis not present

## 2014-04-28 DIAGNOSIS — I10 Essential (primary) hypertension: Secondary | ICD-10-CM | POA: Diagnosis not present

## 2014-04-28 DIAGNOSIS — Z471 Aftercare following joint replacement surgery: Secondary | ICD-10-CM | POA: Diagnosis not present

## 2014-05-02 ENCOUNTER — Encounter: Payer: Self-pay | Admitting: Interventional Cardiology

## 2014-05-02 ENCOUNTER — Ambulatory Visit (INDEPENDENT_AMBULATORY_CARE_PROVIDER_SITE_OTHER): Payer: Medicare Other | Admitting: Interventional Cardiology

## 2014-05-02 VITALS — BP 136/81 | HR 83 | Ht 69.0 in | Wt 244.0 lb

## 2014-05-02 DIAGNOSIS — I2510 Atherosclerotic heart disease of native coronary artery without angina pectoris: Secondary | ICD-10-CM

## 2014-05-02 DIAGNOSIS — I10 Essential (primary) hypertension: Secondary | ICD-10-CM | POA: Diagnosis not present

## 2014-05-02 DIAGNOSIS — I4891 Unspecified atrial fibrillation: Secondary | ICD-10-CM | POA: Diagnosis not present

## 2014-05-02 DIAGNOSIS — I5042 Chronic combined systolic (congestive) and diastolic (congestive) heart failure: Secondary | ICD-10-CM | POA: Diagnosis not present

## 2014-05-02 MED ORDER — FUROSEMIDE 40 MG PO TABS: 40.0000 mg | ORAL_TABLET | Freq: Every day | ORAL | Status: DC

## 2014-05-02 MED ORDER — POTASSIUM CHLORIDE CRYS ER 20 MEQ PO TBCR: 20.0000 meq | EXTENDED_RELEASE_TABLET | Freq: Every day | ORAL | Status: DC

## 2014-05-02 NOTE — Progress Notes (Signed)
Patient ID: Aaron Berg, male   DOB: 1936-04-21, 78 y.o.   MRN: [id number]    [address], [address] [place], [place]  [address] Phone: [phone] Fax:  [phone]  Date:  05/02/2014   ID:  Aaron FRAIZER, DOB February 22, 1936, MRN [id number]  PCP:  Wyatt Haste, MD   ASSESSMENT:  1. Acute on chronic combined systolic and diastolic heart failure following failed electrical cardioversion one week ago 2. Atrial fibrillation with controlled rate 3. Coronary artery disease without angina 4. Amiodarone loading 5. Hypertension  PLAN:  1. increase furosemide to 40 mg per day and KCl 20 mEq per day 2. Clinical followup in one week with a basic metabolic panel 3. Call prior to that time if orthopnea does not start to improve.   SUBJECTIVE: Aaron Berg is a 78 y.o. male who developed atrial fibrillation sometime ago. He has been on amiodarone now for approximately 3 weeks. He underwent attempt at electrical cardioversion last week. He had 3 discharges. On each occasion he would develop sinus rhythm within 30 seconds deteriorated back into atrial fib. Since outpatient attempt at cardioversion, he is noted progressive orthopnea and lower extremity swelling. He denies angina.   Wt Readings from Last 3 Encounters:  05/02/14 244 lb (110.678 kg)  04/26/14 234 lb (106.142 kg)  04/26/14 234 lb (106.142 kg)     Past Medical History  Diagnosis Date  . ASHD (arteriosclerotic heart disease)   . Dyslipidemia     well controlled  . Obesity   . Arthritis   . Male hypogonadism   . Gait disorder   . Hypertension   . Dysrhythmia 2003    AFib after CABG; warfarin x 6 months  . Sleep apnea     cpap  . CHF (congestive heart failure)   . Dry eyes   . Complication of anesthesia     "woke up too soon"  . Depression   . COPD (chronic obstructive pulmonary disease)     "no signs or tests"  . Headache(784.0)     silent migraines- no pain just go blind for just a minute  . Cancer      skin  . Memory loss, short term 2/15    per daughter since last surgery and placement at [facility].  "Hallucinations have stopped"    Current Outpatient Prescriptions  Medication Sig Dispense Refill  . acetaminophen (TYLENOL) 325 MG tablet Take 650 mg by mouth every 4 (four) hours as needed for mild pain or moderate pain.      [name] amiodarone (PACERONE) 200 MG tablet Take 1 tablet (200 mg total) by mouth 2 (two) times daily.  60 tablet  0  . apixaban (ELIQUIS) 5 MG TABS tablet Take 1 tablet (5 mg total) by mouth 2 (two) times daily.  60 tablet  6  . cholecalciferol (VITAMIN D) 1000 UNITS tablet Take 2,000 Units by mouth daily.       . furosemide (LASIX) 40 MG tablet Take 1 tablet (40 mg total) by mouth daily.  30 tablet  0  . metoprolol succinate (TOPROL-XL) 25 MG 24 hr tablet Take 1 tablet (25 mg total) by mouth daily.  90 tablet  1  . Multiple Vitamins-Minerals (MULTIVITAMIN WITH MINERALS) tablet Take 1 tablet by mouth daily.      . potassium chloride SA (K-DUR,KLOR-CON) 20 MEQ tablet Take 1 tablet (20 mEq total) by mouth daily.  30 tablet  0  . rosuvastatin (CRESTOR) 40 MG tablet Take  1 tablet (40 mg total) by mouth daily with breakfast. For Hyperlipdemia  90 tablet  1  . Testosterone 20.25 MG/1.25GM (1.62%) GEL Apply 1 packet topically daily. Apply contents of 1 packet=1.25gm topically to each shoulder daily for hormone therapy       No current facility-administered medications for this visit.    Allergies:    Allergies  Allergen Reactions  . Procardia [Nifedipine] Rash    All over body    Social History:  The patient  reports that he quit smoking about 24 years ago. His smoking use included Cigarettes. He smoked 0.00 packs per day for 30 years. He has never used smokeless tobacco. He reports that he drinks alcohol. He reports that he does not use illicit drugs.   ROS:  Please see the history of present illness.   Orthopnea lower extremity swelling   All other systems reviewed and  negative.   OBJECTIVE: VS:  BP 136/81  Pulse 83  Ht 5\' 9"  (1.753 m)  Wt 244 lb (110.678 kg)  BMI 36.02 kg/m2 Well nourished, well developed, in no acute distress, obese HEENT: normal Neck: JVD moderate elevation. Carotid bruit absent  Cardiac:  normal S1, S2; IIRR; no murmur Lungs:  clear to auscultation bilaterally, no wheezing, rhonchi or rales Abd: soft, nontender, no hepatomegaly Ext: Edema trace to 1+ bilateral. Pulses 1+ Skin: warm and dry Neuro:  CNs 2-12 intact, no focal abnormalities noted  EKG:  Atrial fibrillation with controlled rate. No evidence of acute injury       Signed, Illene Labrador III, MD 05/02/2014 5:23 PM

## 2014-05-02 NOTE — Patient Instructions (Addendum)
Your physician has recommended you make the following change in your medication:  1) INCREASE LASIX TO 40MG  DAILY 2) INCREASE POTASSIUM TO 20MEQ DAILY    Your physician recommends that you return for lab work in: [place] have a follow up appointment scheduled for 05/11/14 @12pm 

## 2014-05-03 DIAGNOSIS — I10 Essential (primary) hypertension: Secondary | ICD-10-CM | POA: Diagnosis not present

## 2014-05-03 DIAGNOSIS — Z96659 Presence of unspecified artificial knee joint: Secondary | ICD-10-CM | POA: Diagnosis not present

## 2014-05-03 DIAGNOSIS — Z951 Presence of aortocoronary bypass graft: Secondary | ICD-10-CM | POA: Diagnosis not present

## 2014-05-03 DIAGNOSIS — I5040 Unspecified combined systolic (congestive) and diastolic (congestive) heart failure: Secondary | ICD-10-CM | POA: Diagnosis not present

## 2014-05-03 DIAGNOSIS — I2510 Atherosclerotic heart disease of native coronary artery without angina pectoris: Secondary | ICD-10-CM | POA: Diagnosis not present

## 2014-05-03 DIAGNOSIS — Z471 Aftercare following joint replacement surgery: Secondary | ICD-10-CM | POA: Diagnosis not present

## 2014-05-09 ENCOUNTER — Other Ambulatory Visit: Payer: Self-pay | Admitting: Interventional Cardiology

## 2014-05-10 ENCOUNTER — Ambulatory Visit (INDEPENDENT_AMBULATORY_CARE_PROVIDER_SITE_OTHER): Payer: Medicare Other | Admitting: Family Medicine

## 2014-05-10 ENCOUNTER — Encounter: Payer: Self-pay | Admitting: Family Medicine

## 2014-05-10 VITALS — BP 110/70 | HR 60 | Wt 234.0 lb

## 2014-05-10 DIAGNOSIS — I2510 Atherosclerotic heart disease of native coronary artery without angina pectoris: Secondary | ICD-10-CM

## 2014-05-10 DIAGNOSIS — F1010 Alcohol abuse, uncomplicated: Secondary | ICD-10-CM

## 2014-05-10 DIAGNOSIS — E291 Testicular hypofunction: Secondary | ICD-10-CM | POA: Diagnosis not present

## 2014-05-10 DIAGNOSIS — G4730 Sleep apnea, unspecified: Secondary | ICD-10-CM

## 2014-05-10 DIAGNOSIS — Z79899 Other long term (current) drug therapy: Secondary | ICD-10-CM

## 2014-05-10 DIAGNOSIS — M549 Dorsalgia, unspecified: Secondary | ICD-10-CM

## 2014-05-10 DIAGNOSIS — D62 Acute posthemorrhagic anemia: Secondary | ICD-10-CM | POA: Diagnosis not present

## 2014-05-10 DIAGNOSIS — Z96659 Presence of unspecified artificial knee joint: Secondary | ICD-10-CM

## 2014-05-10 DIAGNOSIS — I4891 Unspecified atrial fibrillation: Secondary | ICD-10-CM

## 2014-05-10 NOTE — Progress Notes (Signed)
   Subjective:    Patient ID: Aaron Berg, male    DOB: 09/14/1936, 78 y.o.   MRN: [id number]  HPI He is here for evaluation of multiple issues. He was difficult to get to maintain focus. He mentioned initially needing a form filled out for handicap placard and then when on to talk about back pain, hip pain, knee pain that he later described as really more in his quadriceps. He also then discussed difficulty with his heart with congestive failure and recent onset of atrial fibrillation. Apparently cardioversion was attempted recently which was not successful. He complains of fatigue but was very vague concerning when this occurs. He also has a history of sleep apnea but has not had a CPAP readout in quite some time. He has had a recent knee replacements and was in rehabilitation. He complained that the rehabilitation did not help but upon further questioning they did indeed work with him on range of motion and strengthening. He is now walking with a cane and is driving but having difficulty with distances. Review his record indicates he has not had followup on his hypogonadism. He does admit to drinking at least 2 drinks per night. At this point he apparently is not interested in doing any more potential work on his back and would like to get the knee replacement issue behind him. He does complain of quadricep pain when he tries to stand up from a sitting position. He has not discussed this with Dr.Olin   Review of Systems     Objective:   Physical Exam Alert and in no distress otherwise not examined       Assessment & Plan:  Sleep apnea  Hypogonadism male  S/P left TKA  Expected blood loss anemia  Back pain  Atrial fibrillation  Alcohol abuse, daily use  he plans to get a CPAP readout on his machine. He will followup with his cardiologist concerning his atrial fib. I will check a testosterone level. Recommend he followup with Dr. Alvan Dame concerning his quadricep discomfort. Briefly  discussed his alcohol consumption however at this point he is not interested in stopping. At the end of the interview he then mentioned having difficulty with dizziness and memory as well as several other issues and I recommended that he come back next week to discuss these further. Over half an hour spent discussing all these issues with him. Again it was difficult for him to stay on task dealing with one subject at a time.

## 2014-05-10 NOTE — Patient Instructions (Addendum)
Followup with Dr.Olin concerning the pain in her thighs. Make sure you get a CPAP readout. Followup with Dr. Tamala Julian. I will call with results of the blood

## 2014-05-11 ENCOUNTER — Telehealth: Payer: Self-pay | Admitting: Interventional Cardiology

## 2014-05-11 ENCOUNTER — Telehealth: Payer: Self-pay

## 2014-05-11 ENCOUNTER — Ambulatory Visit (INDEPENDENT_AMBULATORY_CARE_PROVIDER_SITE_OTHER): Payer: Medicare Other | Admitting: Interventional Cardiology

## 2014-05-11 ENCOUNTER — Encounter: Payer: Self-pay | Admitting: Interventional Cardiology

## 2014-05-11 ENCOUNTER — Other Ambulatory Visit: Payer: Medicare Other

## 2014-05-11 VITALS — BP 128/76 | HR 84 | Ht 69.0 in | Wt 238.0 lb

## 2014-05-11 DIAGNOSIS — I509 Heart failure, unspecified: Secondary | ICD-10-CM

## 2014-05-11 DIAGNOSIS — I4891 Unspecified atrial fibrillation: Secondary | ICD-10-CM

## 2014-05-11 DIAGNOSIS — I5042 Chronic combined systolic (congestive) and diastolic (congestive) heart failure: Secondary | ICD-10-CM | POA: Diagnosis not present

## 2014-05-11 DIAGNOSIS — I10 Essential (primary) hypertension: Secondary | ICD-10-CM

## 2014-05-11 DIAGNOSIS — E291 Testicular hypofunction: Secondary | ICD-10-CM | POA: Diagnosis not present

## 2014-05-11 DIAGNOSIS — I5040 Unspecified combined systolic (congestive) and diastolic (congestive) heart failure: Secondary | ICD-10-CM | POA: Diagnosis not present

## 2014-05-11 DIAGNOSIS — Z471 Aftercare following joint replacement surgery: Secondary | ICD-10-CM | POA: Diagnosis not present

## 2014-05-11 DIAGNOSIS — I2510 Atherosclerotic heart disease of native coronary artery without angina pectoris: Secondary | ICD-10-CM

## 2014-05-11 DIAGNOSIS — Z96659 Presence of unspecified artificial knee joint: Secondary | ICD-10-CM | POA: Diagnosis not present

## 2014-05-11 DIAGNOSIS — Z951 Presence of aortocoronary bypass graft: Secondary | ICD-10-CM | POA: Diagnosis not present

## 2014-05-11 LAB — BASIC METABOLIC PANEL
BUN: 21 mg/dL (ref 6–23)
CO2: 26 meq/L (ref 19–32)
CREATININE: 1.1 mg/dL (ref 0.50–1.35)
Calcium: 9.6 mg/dL (ref 8.4–10.5)
Chloride: 104 mEq/L (ref 96–112)
GLUCOSE: 86 mg/dL (ref 70–99)
Potassium: 4.3 mEq/L (ref 3.5–5.3)
Sodium: 140 mEq/L (ref 135–145)

## 2014-05-11 NOTE — Progress Notes (Signed)
Patient ID: Aaron Berg, male   DOB: 03/14/1936, 78 y.o.   MRN: [id number]    [address] [place], [place]  [address] Phone: [phone] Fax:  [phone]  Date:  05/11/2014   ID:  Aaron Berg, DOB 11/17/1936, MRN [id number]  PCP:  LALONDE,JOHN CHARLES, MD   ASSESSMENT:  1. Acute on chronic combined systolic and diastolic heart failure, improved after increase in diuretic regimen 2. Atrial fibrillation, continues but with good rate control 3. Chronic anticoagulation 4. Hypogonadism  PLAN:  1. Dr. LaLonde asked that we check a testosterone level 2. Will leave the furosemide dose at the current level is is been dramatic improvement in swelling and dyspnea. We will need to check a basic metabolic panel today 3. We'll schedule elective cardioversion for 3 weeks hence and have an office visit in 5 weeks.   SUBJECTIVE: Aaron Berg is a 78 y.o. male who returns today to followup management of acute on chronic combined systolic and diastolic heart failure. An increase in Lasix has dramatically improved lower extremity swelling and orthopnea. He feels back to baseline. No complaints otherwise. He states about Lalonde forgot all the blood work when last seen by him on yesterday and wonders if we can do it today. That blood work was a testosterone level.   Wt Readings from Last 3 Encounters:  05/11/14 238 lb (107.956 kg)  05/10/14 234 lb (106.142 kg)  05/02/14 244 lb (110.678 kg)     Past Medical History  Diagnosis Date  . ASHD (arteriosclerotic heart disease)   . Dyslipidemia     well controlled  . Obesity   . Arthritis   . Male hypogonadism   . Gait disorder   . Hypertension   . Dysrhythmia 2003    AFib after CABG; warfarin x 6 months  . Sleep apnea     cpap  . CHF (congestive heart failure)   . Dry eyes   . Complication of anesthesia     "woke up too soon"  . Depression   . COPD (chronic obstructive pulmonary disease)     "no signs or tests"    . Headache(784.0)     silent migraines- no pain just go blind for just a minute  . Cancer     skin  . Memory loss, short term 2/15    per daughter since last surgery and placement at [facility].  "Hallucinations have stopped"    Current Outpatient Prescriptions  Medication Sig Dispense Refill  . acetaminophen (TYLENOL) 325 MG tablet Take 650 mg by mouth every 4 (four) hours as needed for mild pain or moderate pain.      . amiodarone (PACERONE) 200 MG tablet TAKE 1 TABLET BY MOUTH TWICE DAILY  60 tablet  6  . apixaban (ELIQUIS) 5 MG TABS tablet Take 1 tablet (5 mg total) by mouth 2 (two) times daily.  60 tablet  6  . cholecalciferol (VITAMIN D) 1000 UNITS tablet Take 2,000 Units by mouth daily.       . furosemide (LASIX) 40 MG tablet Take 1 tablet (40 mg total) by mouth daily.  30 tablet  0  . metoprolol succinate (TOPROL-XL) 25 MG 24 hr tablet Take 1 tablet (25 mg total) by mouth daily.  90 tablet  1  . Multiple Vitamins-Minerals (MULTIVITAMIN WITH MINERALS) tablet Take 1 tablet by mouth daily.      . potassium chloride SA (K-DUR,KLOR-CON) 20 MEQ tablet Take 1 tablet (20 mEq total)   by mouth daily.  30 tablet  0  . rosuvastatin (CRESTOR) 40 MG tablet Take 1 tablet (40 mg total) by mouth daily with breakfast. For Hyperlipdemia  90 tablet  1  . Testosterone 20.25 MG/1.25GM (1.62%) GEL Apply 1 packet topically daily. Apply contents of 1 packet=1.25gm topically to each shoulder daily for hormone therapy       No current facility-administered medications for this visit.    Allergies:    Allergies  Allergen Reactions  . Procardia [Nifedipine] Rash    All over body    Social History:  The patient  reports that he quit smoking about 24 years ago. His smoking use included Cigarettes. He smoked 0.00 packs per day for 30 years. He has never used smokeless tobacco. He reports that he drinks alcohol. He reports that he does not use illicit drugs.   ROS:  Please see the history of present illness.    No neurological complaints or bleeding on Coumadin   All other systems reviewed and negative.   OBJECTIVE: VS:  BP 128/76  Pulse 84  Ht 5' 9" (1.753 m)  Wt 238 lb (107.956 kg)  BMI 35.13 kg/m2 Well nourished, well developed, in no acute distress, elderly HEENT: normal Neck: JVD flat. Carotid bruit absent  Cardiac:  normal S1, S2; IIRR; no murmur Lungs:  clear to auscultation bilaterally, no wheezing, rhonchi or rales Abd: soft, nontender, no hepatomegaly Ext: Edema trace bilateral. Pulses 2+ Skin: warm and dry Neuro:  CNs 2-12 intact, no focal abnormalities noted  EKG:  Not repeated       Signed, Cozetta Seif W. B. Mishika Flippen III, MD 05/11/2014 12:37 PM   

## 2014-05-11 NOTE — Patient Instructions (Addendum)
Your physician recommends that you continue on your current medications as directed. Please refer to the Current Medication list given to you today.  Your physician has recommended that you have a Cardioversion (DCCV). Electrical Cardioversion uses a jolt of electricity to your heart either through paddles or wired patches attached to your chest. This is a controlled, usually prescheduled, procedure. Defibrillation is done under light anesthesia in the hospital, and you usually go home the day of the procedure. This is done to get your heart back into a normal rhythm. You are not awake for the procedure. Please see the instruction sheet given to you today. ( To be scheduled for 2-3 weeks) you have been given some possible dates call the office with a date that works for [phone]  Lab Today: Bmet, Testosterone  Your physician recommends that you return for lab work on 05/22/14 between 7:30am - 5:15 pm  Your physician recommends that you schedule a follow-up appointment in: 5-6 weeks July 17,2015 @ 3:15pm

## 2014-05-11 NOTE — Telephone Encounter (Signed)
New message    Need order for continue home care , htn education, afib, cardio pulmonary program.   Twice a week for 3 week   Once a week for one week   Three time a month for one month.

## 2014-05-11 NOTE — Telephone Encounter (Signed)
Called pt to inform him to come back in and get blood work drawn

## 2014-05-12 ENCOUNTER — Telehealth: Payer: Self-pay

## 2014-05-12 LAB — TESTOSTERONE, FREE, TOTAL, SHBG
Sex Hormone Binding: 38 nmol/L (ref 13–71)
TESTOSTERONE: 217 ng/dL — AB (ref 300–890)
Testosterone, Free: 38 pg/mL — ABNORMAL LOW (ref 47.0–244.0)
Testosterone-% Free: 1.8 % (ref 1.6–2.9)

## 2014-05-12 NOTE — Telephone Encounter (Signed)
pt and pt daughter aware of dccv scheduled with Dr.Smith for 6/10 @1 :30 pt and pt dtr given preprocedure verbal instructions.written instructions mailed to pt at the address provided.[address] [place] [address].pt verbalized understanding

## 2014-05-12 NOTE — Telephone Encounter (Signed)
Will route to Dr.Smith for approval 

## 2014-05-14 DIAGNOSIS — I2510 Atherosclerotic heart disease of native coronary artery without angina pectoris: Secondary | ICD-10-CM | POA: Diagnosis not present

## 2014-05-14 DIAGNOSIS — I4891 Unspecified atrial fibrillation: Secondary | ICD-10-CM | POA: Diagnosis not present

## 2014-05-14 DIAGNOSIS — J449 Chronic obstructive pulmonary disease, unspecified: Secondary | ICD-10-CM | POA: Diagnosis not present

## 2014-05-14 DIAGNOSIS — I5040 Unspecified combined systolic (congestive) and diastolic (congestive) heart failure: Secondary | ICD-10-CM | POA: Diagnosis not present

## 2014-05-14 DIAGNOSIS — I10 Essential (primary) hypertension: Secondary | ICD-10-CM | POA: Diagnosis not present

## 2014-05-15 ENCOUNTER — Encounter (HOSPITAL_COMMUNITY): Payer: Self-pay

## 2014-05-15 NOTE — Telephone Encounter (Signed)
Follow up     Home heath still waiting on home health order.    Unable for home visit on tomorrow.

## 2014-05-16 DIAGNOSIS — I4891 Unspecified atrial fibrillation: Secondary | ICD-10-CM | POA: Diagnosis not present

## 2014-05-16 DIAGNOSIS — I2510 Atherosclerotic heart disease of native coronary artery without angina pectoris: Secondary | ICD-10-CM | POA: Diagnosis not present

## 2014-05-16 DIAGNOSIS — I10 Essential (primary) hypertension: Secondary | ICD-10-CM | POA: Diagnosis not present

## 2014-05-16 DIAGNOSIS — I5040 Unspecified combined systolic (congestive) and diastolic (congestive) heart failure: Secondary | ICD-10-CM | POA: Diagnosis not present

## 2014-05-16 DIAGNOSIS — J449 Chronic obstructive pulmonary disease, unspecified: Secondary | ICD-10-CM | POA: Diagnosis not present

## 2014-05-16 NOTE — Telephone Encounter (Signed)
returned amanda call from [place].adv here that Dr.Smith has given the verbal order ok for pt to received home visits for edu for htn,afib, cardio pul program.she verbalized understanding.

## 2014-05-17 ENCOUNTER — Ambulatory Visit (INDEPENDENT_AMBULATORY_CARE_PROVIDER_SITE_OTHER): Payer: Medicare Other | Admitting: Family Medicine

## 2014-05-17 ENCOUNTER — Encounter: Payer: Self-pay | Admitting: Family Medicine

## 2014-05-17 VITALS — BP 110/80

## 2014-05-17 DIAGNOSIS — F329 Major depressive disorder, single episode, unspecified: Secondary | ICD-10-CM

## 2014-05-17 DIAGNOSIS — E291 Testicular hypofunction: Secondary | ICD-10-CM

## 2014-05-17 DIAGNOSIS — M549 Dorsalgia, unspecified: Secondary | ICD-10-CM

## 2014-05-17 DIAGNOSIS — I4891 Unspecified atrial fibrillation: Secondary | ICD-10-CM | POA: Diagnosis not present

## 2014-05-17 DIAGNOSIS — G4730 Sleep apnea, unspecified: Secondary | ICD-10-CM | POA: Diagnosis not present

## 2014-05-17 DIAGNOSIS — H8110 Benign paroxysmal vertigo, unspecified ear: Secondary | ICD-10-CM

## 2014-05-17 DIAGNOSIS — F3289 Other specified depressive episodes: Secondary | ICD-10-CM

## 2014-05-17 DIAGNOSIS — I2510 Atherosclerotic heart disease of native coronary artery without angina pectoris: Secondary | ICD-10-CM | POA: Diagnosis not present

## 2014-05-17 MED ORDER — MECLIZINE HCL 12.5 MG PO TABS: 12.5000 mg | ORAL_TABLET | Freq: Two times a day (BID) | ORAL | Status: DC | PRN

## 2014-05-17 MED ORDER — FLUOXETINE HCL 10 MG PO CAPS: 10.0000 mg | ORAL_CAPSULE | Freq: Every day | ORAL | Status: DC

## 2014-05-17 NOTE — Progress Notes (Signed)
   Subjective:    Patient ID: Aaron Berg, male    DOB: [birth year], 78 y.o.   MRN: [id number]  HPI He is here for a followup visit. He will followup with Dr. Joya Salm at a later date concerning his back and hip trouble. He is seeing Dr. Tamala Julian for his underlying CHF and A. fib. He is scheduled for another cardioversion. He apparently does have a CPAP readout however he does not hear. Recent blood work did show testosterone of 217. He has started taking his testosterone again 2 days ago. He has difficulty with constant dizziness as well as a previous history of BPPV and states that now had motion and practically any direction causes dizziness. Epley maneuvers were only minimally successful. He has not been tried on any medications. He also admits to being depressed, having anhedonia, fatigue, memory issues. He has started drinking again and states he drinks roughly 5 ounces per night.   Review of Systems     Objective:   Physical Exam Alert and in no distress otherwise not examined. His affect is slightly flat.      Assessment & Plan:  Sleep apnea  Hypogonadism male  Back pain  Atrial fibrillation  BPPV (benign paroxysmal positional vertigo) - Plan: meclizine (ANTIVERT) 12.5 MG tablet  Depressive disorder, not elsewhere classified - Plan: FLUoxetine (PROZAC) 10 MG capsule  I will check a CPAP readout when it comes in. He is to return here in one month for recheck on testosterone and for his dizziness. I will also place him on Prozac to help with his depressive type symptoms. Encouraged him to stop all alcohol consumption.

## 2014-05-18 DIAGNOSIS — I2510 Atherosclerotic heart disease of native coronary artery without angina pectoris: Secondary | ICD-10-CM | POA: Diagnosis not present

## 2014-05-18 DIAGNOSIS — J449 Chronic obstructive pulmonary disease, unspecified: Secondary | ICD-10-CM | POA: Diagnosis not present

## 2014-05-18 DIAGNOSIS — I5040 Unspecified combined systolic (congestive) and diastolic (congestive) heart failure: Secondary | ICD-10-CM | POA: Diagnosis not present

## 2014-05-18 DIAGNOSIS — I10 Essential (primary) hypertension: Secondary | ICD-10-CM | POA: Diagnosis not present

## 2014-05-18 DIAGNOSIS — I4891 Unspecified atrial fibrillation: Secondary | ICD-10-CM | POA: Diagnosis not present

## 2014-05-22 ENCOUNTER — Ambulatory Visit (INDEPENDENT_AMBULATORY_CARE_PROVIDER_SITE_OTHER): Payer: Medicare Other | Admitting: *Deleted

## 2014-05-22 ENCOUNTER — Telehealth: Payer: Self-pay

## 2014-05-22 DIAGNOSIS — E785 Hyperlipidemia, unspecified: Secondary | ICD-10-CM

## 2014-05-22 DIAGNOSIS — I4891 Unspecified atrial fibrillation: Secondary | ICD-10-CM

## 2014-05-22 DIAGNOSIS — I5040 Unspecified combined systolic (congestive) and diastolic (congestive) heart failure: Secondary | ICD-10-CM | POA: Diagnosis not present

## 2014-05-22 DIAGNOSIS — Z79899 Other long term (current) drug therapy: Secondary | ICD-10-CM

## 2014-05-22 DIAGNOSIS — J449 Chronic obstructive pulmonary disease, unspecified: Secondary | ICD-10-CM | POA: Diagnosis not present

## 2014-05-22 DIAGNOSIS — I2510 Atherosclerotic heart disease of native coronary artery without angina pectoris: Secondary | ICD-10-CM | POA: Diagnosis not present

## 2014-05-22 DIAGNOSIS — I10 Essential (primary) hypertension: Secondary | ICD-10-CM

## 2014-05-22 DIAGNOSIS — I5042 Chronic combined systolic (congestive) and diastolic (congestive) heart failure: Secondary | ICD-10-CM

## 2014-05-22 DIAGNOSIS — I509 Heart failure, unspecified: Secondary | ICD-10-CM

## 2014-05-22 LAB — CBC WITH DIFFERENTIAL/PLATELET
BASOS ABS: 0 10*3/uL (ref 0.0–0.1)
Basophils Relative: 0.4 % (ref 0.0–3.0)
Eosinophils Absolute: 0.1 10*3/uL (ref 0.0–0.7)
Eosinophils Relative: 1.2 % (ref 0.0–5.0)
HCT: 41.3 % (ref 39.0–52.0)
Hemoglobin: 13.3 g/dL (ref 13.0–17.0)
LYMPHS PCT: 18.3 % (ref 12.0–46.0)
Lymphs Abs: 1.6 10*3/uL (ref 0.7–4.0)
MCHC: 32.3 g/dL (ref 30.0–36.0)
MCV: 85.5 fl (ref 78.0–100.0)
MONOS PCT: 7.7 % (ref 3.0–12.0)
Monocytes Absolute: 0.7 10*3/uL (ref 0.1–1.0)
Neutro Abs: 6.2 10*3/uL (ref 1.4–7.7)
Neutrophils Relative %: 72.4 % (ref 43.0–77.0)
Platelets: 167 10*3/uL (ref 150.0–400.0)
RBC: 4.83 Mil/uL (ref 4.22–5.81)
RDW: 17.2 % — ABNORMAL HIGH (ref 11.5–15.5)
WBC: 8.6 10*3/uL (ref 4.0–10.5)

## 2014-05-22 LAB — BASIC METABOLIC PANEL
BUN: 23 mg/dL (ref 6–23)
CO2: 26 meq/L (ref 19–32)
CREATININE: 1.2 mg/dL (ref 0.4–1.5)
Calcium: 9.8 mg/dL (ref 8.4–10.5)
Chloride: 103 mEq/L (ref 96–112)
GFR: 62.54 mL/min (ref >60.00)
GLUCOSE: 80 mg/dL (ref 70–99)
Potassium: 4.4 mEq/L (ref 3.5–5.1)
SODIUM: 138 meq/L (ref 135–145)

## 2014-05-22 LAB — PROTIME-INR
INR: 1.34 (ref <1.50)
Prothrombin Time: 16.3 seconds — ABNORMAL HIGH (ref 11.6–15.2)

## 2014-05-22 NOTE — Telephone Encounter (Signed)
pt aware of lab results.Heart labs are normal. Testosterone is low.will fwd to pcp pt verbalized understanding.

## 2014-05-22 NOTE — Telephone Encounter (Signed)
Message copied by Lamar Laundry on Mon May 22, 2014 11:01 AM ------      Message from: Daneen Schick      Created: Wed May 17, 2014  5:07 PM       Heart kabs are normal. Testosterone is low. ------

## 2014-05-24 ENCOUNTER — Encounter (HOSPITAL_COMMUNITY): Payer: Medicare Other | Admitting: Anesthesiology

## 2014-05-24 ENCOUNTER — Ambulatory Visit (HOSPITAL_COMMUNITY): Payer: Medicare Other | Admitting: Anesthesiology

## 2014-05-24 ENCOUNTER — Encounter (HOSPITAL_COMMUNITY)
Admission: RE | Disposition: A | Discharge status: Home / Self Care | Payer: Self-pay | Source: Ambulatory Visit | Attending: Interventional Cardiology

## 2014-05-24 ENCOUNTER — Encounter (HOSPITAL_COMMUNITY): Payer: Self-pay | Admitting: Anesthesiology

## 2014-05-24 ENCOUNTER — Ambulatory Visit (HOSPITAL_COMMUNITY)
Admission: RE | Admit: 2014-05-24 | Discharge: 2014-05-24 | Disposition: A | Discharge status: Home / Self Care | Payer: Medicare Other | Source: Ambulatory Visit | Attending: Interventional Cardiology | Admitting: Interventional Cardiology

## 2014-05-24 DIAGNOSIS — G4730 Sleep apnea, unspecified: Secondary | ICD-10-CM | POA: Diagnosis not present

## 2014-05-24 DIAGNOSIS — Z7901 Long term (current) use of anticoagulants: Secondary | ICD-10-CM | POA: Diagnosis not present

## 2014-05-24 DIAGNOSIS — I2510 Atherosclerotic heart disease of native coronary artery without angina pectoris: Secondary | ICD-10-CM | POA: Insufficient documentation

## 2014-05-24 DIAGNOSIS — I4891 Unspecified atrial fibrillation: Secondary | ICD-10-CM | POA: Insufficient documentation

## 2014-05-24 DIAGNOSIS — I509 Heart failure, unspecified: Secondary | ICD-10-CM | POA: Insufficient documentation

## 2014-05-24 DIAGNOSIS — Z951 Presence of aortocoronary bypass graft: Secondary | ICD-10-CM | POA: Diagnosis not present

## 2014-05-24 DIAGNOSIS — Z85828 Personal history of other malignant neoplasm of skin: Secondary | ICD-10-CM | POA: Insufficient documentation

## 2014-05-24 DIAGNOSIS — Z87891 Personal history of nicotine dependence: Secondary | ICD-10-CM | POA: Insufficient documentation

## 2014-05-24 DIAGNOSIS — Z79899 Other long term (current) drug therapy: Secondary | ICD-10-CM | POA: Diagnosis not present

## 2014-05-24 DIAGNOSIS — J4489 Other specified chronic obstructive pulmonary disease: Secondary | ICD-10-CM | POA: Insufficient documentation

## 2014-05-24 DIAGNOSIS — J449 Chronic obstructive pulmonary disease, unspecified: Secondary | ICD-10-CM | POA: Insufficient documentation

## 2014-05-24 DIAGNOSIS — I10 Essential (primary) hypertension: Secondary | ICD-10-CM | POA: Insufficient documentation

## 2014-05-24 DIAGNOSIS — E785 Hyperlipidemia, unspecified: Secondary | ICD-10-CM | POA: Diagnosis not present

## 2014-05-24 DIAGNOSIS — D649 Anemia, unspecified: Secondary | ICD-10-CM | POA: Insufficient documentation

## 2014-05-24 HISTORY — PX: CARDIOVERSION: SHX1299

## 2014-05-24 SURGERY — CARDIOVERSION
Anesthesia: General

## 2014-05-24 MED ORDER — PROPOFOL 10 MG/ML IV BOLUS
INTRAVENOUS | Status: DC | PRN
  Administered 2014-05-24: 80 mg via INTRAVENOUS

## 2014-05-24 MED ORDER — SODIUM CHLORIDE 0.9 % IJ SOLN: 3.0000 mL | INTRAMUSCULAR | Status: DC | PRN

## 2014-05-24 MED ORDER — HYDROCORTISONE 1 % EX CREA
1.0000 "application " | TOPICAL_CREAM | Freq: Three times a day (TID) | CUTANEOUS | Status: DC | PRN
  Filled 2014-05-24: qty 28

## 2014-05-24 MED ORDER — LACTATED RINGERS IV SOLN
INTRAVENOUS | Status: DC | PRN
  Administered 2014-05-24: 13:00:00 via INTRAVENOUS

## 2014-05-24 MED ORDER — APIXABAN 5 MG PO TABS
5.0000 mg | ORAL_TABLET | Freq: Two times a day (BID) | ORAL | Status: DC
  Administered 2014-05-24: 5 mg via ORAL
  Filled 2014-05-24 (×2): qty 1

## 2014-05-24 MED ORDER — SODIUM CHLORIDE 0.9 % IJ SOLN: 3.0000 mL | Freq: Two times a day (BID) | INTRAMUSCULAR | Status: DC

## 2014-05-24 MED ORDER — SODIUM CHLORIDE 0.9 % IV SOLN
INTRAVENOUS | Status: DC
  Administered 2014-05-24: 500 mL via INTRAVENOUS

## 2014-05-24 MED ORDER — SODIUM CHLORIDE 0.9 % IV SOLN: 250.0000 mL | INTRAVENOUS | Status: DC

## 2014-05-24 NOTE — Anesthesia Preprocedure Evaluation (Signed)
Anesthesia Evaluation  Patient identified by MRN, date of birth, ID band Patient awake    Reviewed: Allergy & Precautions, H&P , NPO status , Patient's Chart, lab work & pertinent test results, reviewed documented beta blocker date and time   History of Anesthesia Complications (+) AWARENESS UNDER ANESTHESIA and history of anesthetic complications  Airway Mallampati: II TM Distance: >3 FB Neck ROM: full    Dental   Pulmonary sleep apnea , COPDformer smoker,  breath sounds clear to auscultation        Cardiovascular hypertension, + CAD, + CABG and +CHF negative cardio ROS  + dysrhythmias Atrial Fibrillation Rhythm:regular     Neuro/Psych  Headaches, PSYCHIATRIC DISORDERS    GI/Hepatic negative GI ROS, Neg liver ROS, (+)     substance abuse  alcohol use,   Endo/Other    Renal/GU negative Renal ROS  negative genitourinary   Musculoskeletal   Abdominal   Peds  Hematology  (+) anemia ,   Anesthesia Other Findings See surgeon's H&P   Reproductive/Obstetrics negative OB ROS                           Anesthesia Physical Anesthesia Plan  ASA: III  Anesthesia Plan: General   Post-op Pain Management:    Induction: Intravenous  Airway Management Planned: Mask  Additional Equipment:   Intra-op Plan:   Post-operative Plan:   Informed Consent: I have reviewed the patients History and Physical, chart, labs and discussed the procedure including the risks, benefits and alternatives for the proposed anesthesia with the patient or authorized representative who has indicated his/her understanding and acceptance.   Dental Advisory Given  Plan Discussed with: CRNA and Surgeon  Anesthesia Plan Comments:         Anesthesia Quick Evaluation

## 2014-05-24 NOTE — H&P (View-Only) (Signed)
Patient ID: Aaron Berg, male   DOB: 02-Oct-1936, 78 y.o.   MRN: [id number]    [address] [place], [place]  [address] Phone: [phone] Fax:  [phone]  Date:  05/11/2014   ID:  Aaron Berg, DOB 1936-03-17, MRN [id number]  PCP:  Wyatt Haste, MD   ASSESSMENT:  1. Acute on chronic combined systolic and diastolic heart failure, improved after increase in diuretic regimen 2. Atrial fibrillation, continues but with good rate control 3. Chronic anticoagulation 4. Hypogonadism  PLAN:  1. Dr. Redmond [employer] asked that we check a testosterone level 2. Will leave the furosemide dose at the current level is is been dramatic improvement in swelling and dyspnea. We will need to check a basic metabolic panel today 3. We'll schedule elective cardioversion for 3 weeks hence and have an office visit in 5 weeks.   SUBJECTIVE: Aaron Berg is a 78 y.o. male who returns today to followup management of acute on chronic combined systolic and diastolic heart failure. An increase in Lasix has dramatically improved lower extremity swelling and orthopnea. He feels back to baseline. No complaints otherwise. He states about Redmond [employer] forgot all the blood work when last seen by him on yesterday and wonders if we can do it today. That blood work was a testosterone level.   Wt Readings from Last 3 Encounters:  05/11/14 238 lb (107.956 kg)  05/10/14 234 lb (106.142 kg)  05/02/14 244 lb (110.678 kg)     Past Medical History  Diagnosis Date  . ASHD (arteriosclerotic heart disease)   . Dyslipidemia     well controlled  . Obesity   . Arthritis   . Male hypogonadism   . Gait disorder   . Hypertension   . Dysrhythmia 2003    AFib after CABG; warfarin x 6 months  . Sleep apnea     cpap  . CHF (congestive heart failure)   . Dry eyes   . Complication of anesthesia     "woke up too soon"  . Depression   . COPD (chronic obstructive pulmonary disease)     "no signs or tests"    . Headache(784.0)     silent migraines- no pain just go blind for just a minute  . Cancer     skin  . Memory loss, short term 2/15    per daughter since last surgery and placement at [facility].  "Hallucinations have stopped"    Current Outpatient Prescriptions  Medication Sig Dispense Refill  . acetaminophen (TYLENOL) 325 MG tablet Take 650 mg by mouth every 4 (four) hours as needed for mild pain or moderate pain.      [name] amiodarone (PACERONE) 200 MG tablet TAKE 1 TABLET BY MOUTH TWICE DAILY  60 tablet  6  . apixaban (ELIQUIS) 5 MG TABS tablet Take 1 tablet (5 mg total) by mouth 2 (two) times daily.  60 tablet  6  . cholecalciferol (VITAMIN D) 1000 UNITS tablet Take 2,000 Units by mouth daily.       . furosemide (LASIX) 40 MG tablet Take 1 tablet (40 mg total) by mouth daily.  30 tablet  0  . metoprolol succinate (TOPROL-XL) 25 MG 24 hr tablet Take 1 tablet (25 mg total) by mouth daily.  90 tablet  1  . Multiple Vitamins-Minerals (MULTIVITAMIN WITH MINERALS) tablet Take 1 tablet by mouth daily.      . potassium chloride SA (K-DUR,KLOR-CON) 20 MEQ tablet Take 1 tablet (20 mEq total)  by mouth daily.  30 tablet  0  . rosuvastatin (CRESTOR) 40 MG tablet Take 1 tablet (40 mg total) by mouth daily with breakfast. For Hyperlipdemia  90 tablet  1  . Testosterone 20.25 MG/1.25GM (1.62%) GEL Apply 1 packet topically daily. Apply contents of 1 packet=1.25gm topically to each shoulder daily for hormone therapy       No current facility-administered medications for this visit.    Allergies:    Allergies  Allergen Reactions  . Procardia [Nifedipine] Rash    All over body    Social History:  The patient  reports that he quit smoking about 24 years ago. His smoking use included Cigarettes. He smoked 0.00 packs per day for 30 years. He has never used smokeless tobacco. He reports that he drinks alcohol. He reports that he does not use illicit drugs.   ROS:  Please see the history of present illness.    No neurological complaints or bleeding on Coumadin   All other systems reviewed and negative.   OBJECTIVE: VS:  BP 128/76  Pulse 84  Ht 5\' 9"  (1.753 m)  Wt 238 lb (107.956 kg)  BMI 35.13 kg/m2 Well nourished, well developed, in no acute distress, elderly HEENT: normal Neck: JVD flat. Carotid bruit absent  Cardiac:  normal S1, S2; IIRR; no murmur Lungs:  clear to auscultation bilaterally, no wheezing, rhonchi or rales Abd: soft, nontender, no hepatomegaly Ext: Edema trace bilateral. Pulses 2+ Skin: warm and dry Neuro:  CNs 2-12 intact, no focal abnormalities noted  EKG:  Not repeated       Signed, Illene Labrador III, MD 05/11/2014 12:37 PM

## 2014-05-24 NOTE — Anesthesia Postprocedure Evaluation (Signed)
  Anesthesia Post-op Note  Patient: Aaron Berg  Procedure(s) Performed: Procedure(s): CARDIOVERSION (N/A)  Patient Location: PACU and Endoscopy Unit  Anesthesia Type:General  Level of Consciousness: awake, alert , oriented and patient cooperative  Airway and Oxygen Therapy: Patient Spontanous Breathing and Patient connected to nasal cannula oxygen  Post-op Pain: none  Post-op Assessment: Post-op Vital signs reviewed, Patient's Cardiovascular Status Stable, Respiratory Function Stable, Patent Airway and No signs of Nausea or vomiting  Post-op Vital Signs: Reviewed and stable  Last Vitals:  Filed Vitals:   05/24/14 1343  BP:   Pulse:   Temp: 36.4 C  Resp:     Complications: No apparent anesthesia complications

## 2014-05-24 NOTE — CV Procedure (Signed)
Electrical Cardioversion Procedure Note Aaron Berg [id number] Sep 18, 1936  Procedure: Electrical Cardioversion Indications:  Atrial Fibrillation  Time Out: Verified patient identification, verified procedure,medications/allergies/relevent history reviewed, required imaging and test results available.  Performed  Procedure Details  The patient was NPO after midnight. Anesthesia was administered at the beside  by Dr.[name] with 80mg  of propofol.  Cardioversion was done with synchronized biphasic defibrillation with AP pads with 200watts.  The patient converted to normal sinus rhythm. The patient tolerated the procedure well   IMPRESSION:  Successful cardioversion of atrial fibrillation    Aaron Berg W 05/24/2014, 1:33 PM

## 2014-05-24 NOTE — Discharge Instructions (Signed)
Moderate Sedation, Adult Moderate sedation is given to help you relax or even sleep through a procedure. You may remain sleepy, be clumsy, or have poor balance for several hours following this procedure. Arrange for a responsible adult, family member, or friend to take you home. A responsible adult should stay with you for at least 24 hours or until the medicines have worn off.  Do not participate in any activities where you could become injured for the next 24 hours, or until you feel normal again. Do not:  Drive.  Swim.  Ride a bicycle.  Operate heavy machinery.  Cook.  Use power tools.  Climb ladders.  Work at [employer].  Do not make important decisions or sign legal documents until you are improved.  Vomiting may occur if you eat too soon. When you can drink without vomiting, try water, juice, or soup. Try solid foods if you feel little or no nausea.  Only take over-the-counter or prescription medications for pain, discomfort, or fever as directed by your caregiver.If pain medications have been prescribed for you, ask your caregiver how soon it is safe to take them.  Make sure you and your family fully understands everything about the medication given to you. Make sure you understand what side effects may occur.  You should not drink alcohol, take sleeping pills, or medications that cause drowsiness for at least 24 hours.  If you smoke, do not smoke alone.  If you are feeling better, you may resume normal activities 24 hours after receiving sedation.  Keep all appointments as scheduled. Follow all instructions.  Ask questions if you do not understand. SEEK MEDICAL CARE IF:   Your skin is pale or bluish in color.  You continue to feel sick to your stomach (nauseous) or throw up (vomit).  Your pain is getting worse and not helped by medication.  You have bleeding or swelling.  You are still sleepy or feeling clumsy after 24 hours. SEEK IMMEDIATE MEDICAL CARE IF:    You develop a rash.  You have difficulty breathing.  You develop any type of allergic problem.  You have a fever. Document Released: 08/26/2001 Document Revised: 02/23/2012 Document Reviewed: 08/08/2013 [facility] Patient Information 2014 [place]. Electrical Cardioversion Electrical cardioversion is the delivery of a jolt of electricity to change the rhythm of the heart. Sticky patches or metal paddles are placed on the chest to deliver the electricity from a device. This is done to restore a normal rhythm. A rhythm that is too fast or not regular keeps the heart from pumping well. Electrical cardioversion is done in an emergency if:   There is low or no blood pressure as a result of the heart rhythm.   Normal rhythm must be restored as fast as possible to protect the brain and heart from further damage.   It may save a life. Cardioversion may be done for heart rhythms that are not immediately life-threatening, such as atrial fibrillation or flutter, in which:   The heart is beating too fast or is not regular.   Medicine to change the rhythm has not worked.   It is safe to wait in order to allow time for preparation.  Symptoms of the abnormal rhythm are bothersome.  The risk of stroke and other serious complications can be reduced. LET YOUR CAREGIVER KNOW ABOUT:   All medicines you are taking, including vitamins, herbs, eye drops, creams, and over-the-counter medicines.   Previous problems you or members of your family have had with  the use of anesthetics.   Any blood disorders you have.   Previous surgeries you have had.   Medical conditions you have. RISKS AND COMPLICATIONS  Generally, this is a safe procedure. However, as with any procedure, complications can occur. Possible complications include:   Breathing problems related to the anesthetic used.  Cardiac arrest This risk is rare.  A blood clot that breaks free and travels to other parts of your  body. This could cause a stroke or other problems. The risk of this is lowered by use of blood thinning medicine (anticoagulant) prior to the procedure. BEFORE THE PROCEDURE   You may have tests to detect blood clots in your heart and evaluate heart function.  You may start taking anticoagulants so your blood does not clot as easily.   Medicines may be given to help stabilize your heart rate and rhythm. PROCEDURE  You will be given medicine through an IV tube to reduce discomfort and make you sleepy (sedative).   An electrical shock will be delivered. AFTER THE PROCEDURE Your heart rhythm will be watched to make sure it does not change.You may be able to go home within a few hours.  Document Released: 11/21/2002 Document Revised: 09/21/2013 Document Reviewed: 06/15/2013 [facility] Patient Information 2014 [place].

## 2014-05-24 NOTE — Transfer of Care (Signed)
Immediate Anesthesia Transfer of Care Note  Patient: Aaron Berg  Procedure(s) Performed: Procedure(s): CARDIOVERSION (N/A)  Patient Location: PACU and Endoscopy Unit  Anesthesia Type:General  Level of Consciousness: awake, alert , oriented and patient cooperative  Airway & Oxygen Therapy: Patient Spontanous Breathing and Patient connected to nasal cannula oxygen  Post-op Assessment: Report given to PACU RN, Post -op Vital signs reviewed and stable and Patient moving all extremities  Post vital signs: Reviewed and stable  Complications: No apparent anesthesia complications

## 2014-05-24 NOTE — Interval H&P Note (Signed)
History and Physical Interval Note:  05/24/2014 1:09 PM  Aaron Berg  has presented today for surgery, with the diagnosis of A FIB  The various methods of treatment have been discussed with the patient and family. After consideration of risks, benefits and other options for treatment, the patient has consented to  Procedure(s): CARDIOVERSION (N/A) as a surgical intervention .  The patient's history has been reviewed, patient examined, no change in status, stable for surgery.  I have reviewed the patient's chart and labs.  Questions were answered to the patient's satisfaction.     Sinclair Grooms

## 2014-05-26 ENCOUNTER — Encounter (HOSPITAL_COMMUNITY): Payer: Self-pay | Admitting: Interventional Cardiology

## 2014-05-26 DIAGNOSIS — I4891 Unspecified atrial fibrillation: Secondary | ICD-10-CM | POA: Diagnosis not present

## 2014-05-26 DIAGNOSIS — J449 Chronic obstructive pulmonary disease, unspecified: Secondary | ICD-10-CM | POA: Diagnosis not present

## 2014-05-26 DIAGNOSIS — I10 Essential (primary) hypertension: Secondary | ICD-10-CM | POA: Diagnosis not present

## 2014-05-26 DIAGNOSIS — I2510 Atherosclerotic heart disease of native coronary artery without angina pectoris: Secondary | ICD-10-CM | POA: Diagnosis not present

## 2014-05-26 DIAGNOSIS — I5040 Unspecified combined systolic (congestive) and diastolic (congestive) heart failure: Secondary | ICD-10-CM | POA: Diagnosis not present

## 2014-05-30 DIAGNOSIS — I5040 Unspecified combined systolic (congestive) and diastolic (congestive) heart failure: Secondary | ICD-10-CM | POA: Diagnosis not present

## 2014-05-30 DIAGNOSIS — I2510 Atherosclerotic heart disease of native coronary artery without angina pectoris: Secondary | ICD-10-CM | POA: Diagnosis not present

## 2014-05-30 DIAGNOSIS — I10 Essential (primary) hypertension: Secondary | ICD-10-CM | POA: Diagnosis not present

## 2014-05-30 DIAGNOSIS — J449 Chronic obstructive pulmonary disease, unspecified: Secondary | ICD-10-CM | POA: Diagnosis not present

## 2014-05-30 DIAGNOSIS — I4891 Unspecified atrial fibrillation: Secondary | ICD-10-CM | POA: Diagnosis not present

## 2014-05-31 DIAGNOSIS — H52209 Unspecified astigmatism, unspecified eye: Secondary | ICD-10-CM | POA: Diagnosis not present

## 2014-05-31 DIAGNOSIS — H2640 Unspecified secondary cataract: Secondary | ICD-10-CM | POA: Diagnosis not present

## 2014-05-31 DIAGNOSIS — H04129 Dry eye syndrome of unspecified lacrimal gland: Secondary | ICD-10-CM | POA: Diagnosis not present

## 2014-05-31 DIAGNOSIS — Z961 Presence of intraocular lens: Secondary | ICD-10-CM | POA: Diagnosis not present

## 2014-06-02 ENCOUNTER — Other Ambulatory Visit: Payer: Self-pay | Admitting: Interventional Cardiology

## 2014-06-02 DIAGNOSIS — I2510 Atherosclerotic heart disease of native coronary artery without angina pectoris: Secondary | ICD-10-CM | POA: Diagnosis not present

## 2014-06-02 DIAGNOSIS — I5040 Unspecified combined systolic (congestive) and diastolic (congestive) heart failure: Secondary | ICD-10-CM | POA: Diagnosis not present

## 2014-06-02 DIAGNOSIS — I4891 Unspecified atrial fibrillation: Secondary | ICD-10-CM | POA: Diagnosis not present

## 2014-06-02 DIAGNOSIS — I10 Essential (primary) hypertension: Secondary | ICD-10-CM | POA: Diagnosis not present

## 2014-06-02 DIAGNOSIS — J449 Chronic obstructive pulmonary disease, unspecified: Secondary | ICD-10-CM | POA: Diagnosis not present

## 2014-06-07 DIAGNOSIS — I4891 Unspecified atrial fibrillation: Secondary | ICD-10-CM | POA: Diagnosis not present

## 2014-06-07 DIAGNOSIS — J449 Chronic obstructive pulmonary disease, unspecified: Secondary | ICD-10-CM | POA: Diagnosis not present

## 2014-06-07 DIAGNOSIS — I10 Essential (primary) hypertension: Secondary | ICD-10-CM | POA: Diagnosis not present

## 2014-06-07 DIAGNOSIS — I5040 Unspecified combined systolic (congestive) and diastolic (congestive) heart failure: Secondary | ICD-10-CM | POA: Diagnosis not present

## 2014-06-07 DIAGNOSIS — I2510 Atherosclerotic heart disease of native coronary artery without angina pectoris: Secondary | ICD-10-CM | POA: Diagnosis not present

## 2014-06-10 ENCOUNTER — Other Ambulatory Visit: Payer: Self-pay | Admitting: Interventional Cardiology

## 2014-06-14 DIAGNOSIS — J449 Chronic obstructive pulmonary disease, unspecified: Secondary | ICD-10-CM | POA: Diagnosis not present

## 2014-06-14 DIAGNOSIS — I4891 Unspecified atrial fibrillation: Secondary | ICD-10-CM | POA: Diagnosis not present

## 2014-06-14 DIAGNOSIS — I10 Essential (primary) hypertension: Secondary | ICD-10-CM | POA: Diagnosis not present

## 2014-06-14 DIAGNOSIS — I5040 Unspecified combined systolic (congestive) and diastolic (congestive) heart failure: Secondary | ICD-10-CM | POA: Diagnosis not present

## 2014-06-14 DIAGNOSIS — I2510 Atherosclerotic heart disease of native coronary artery without angina pectoris: Secondary | ICD-10-CM | POA: Diagnosis not present

## 2014-06-20 ENCOUNTER — Ambulatory Visit (INDEPENDENT_AMBULATORY_CARE_PROVIDER_SITE_OTHER): Payer: Medicare Other | Admitting: Family Medicine

## 2014-06-20 VITALS — BP 126/80 | HR 76

## 2014-06-20 DIAGNOSIS — E291 Testicular hypofunction: Secondary | ICD-10-CM | POA: Diagnosis not present

## 2014-06-20 DIAGNOSIS — R42 Dizziness and giddiness: Secondary | ICD-10-CM | POA: Diagnosis not present

## 2014-06-20 DIAGNOSIS — I2510 Atherosclerotic heart disease of native coronary artery without angina pectoris: Secondary | ICD-10-CM | POA: Diagnosis not present

## 2014-06-20 NOTE — Progress Notes (Signed)
   Subjective:    Patient ID: Aaron Berg, male    DOB: 1936/07/24, 78 y.o.   MRN: [id number]  HPI He is here for recheck. He has had difficulty with dizziness for the last several months. It does wax and wane but never goes away. He has a previous history of BPPV but states that his present dizziness is not related to position. He has seen Dr. Erling Cruz in the past and apparently had an extensive workup which was negative. He actually describes different types of dizziness and he has had over the last several years. He also is on testosterone but admits to taking it only 50% a time.  Review of Systems     Objective:   Physical Exam alert and in no distress. EOMI. DTRs are 2+. Tympanic membranes and canals are normal. Throat is clear. Tonsils are normal. Neck is supple without adenopathy or thyromegaly. Cardiac exam shows a regular sinus rhythm without murmurs or gallops. Lungs are clear to auscultation.        Assessment & Plan:  Dizziness - Plan: Ambulatory referral to Neurology  Hypogonadism male  since she's had these episodes of dizziness for quite some time, think it's time to refer back to neurology to get another opinion. Also discussed the need for him to stay on testosterone regularly and then return here in one month for repeat testosterone testing.

## 2014-06-26 DIAGNOSIS — I5040 Unspecified combined systolic (congestive) and diastolic (congestive) heart failure: Secondary | ICD-10-CM | POA: Diagnosis not present

## 2014-06-26 DIAGNOSIS — I4891 Unspecified atrial fibrillation: Secondary | ICD-10-CM | POA: Diagnosis not present

## 2014-06-26 DIAGNOSIS — I10 Essential (primary) hypertension: Secondary | ICD-10-CM | POA: Diagnosis not present

## 2014-06-26 DIAGNOSIS — J449 Chronic obstructive pulmonary disease, unspecified: Secondary | ICD-10-CM | POA: Diagnosis not present

## 2014-06-26 DIAGNOSIS — I2510 Atherosclerotic heart disease of native coronary artery without angina pectoris: Secondary | ICD-10-CM | POA: Diagnosis not present

## 2014-06-27 ENCOUNTER — Institutional Professional Consult (permissible substitution): Payer: PRIVATE HEALTH INSURANCE | Admitting: Diagnostic Neuroimaging

## 2014-06-30 ENCOUNTER — Ambulatory Visit (INDEPENDENT_AMBULATORY_CARE_PROVIDER_SITE_OTHER): Payer: Medicare Other | Admitting: Interventional Cardiology

## 2014-06-30 ENCOUNTER — Other Ambulatory Visit: Payer: Self-pay | Admitting: Interventional Cardiology

## 2014-06-30 ENCOUNTER — Ambulatory Visit
Admission: RE | Admit: 2014-06-30 | Discharge: 2014-06-30 | Disposition: A | Discharge status: Home / Self Care | Payer: Medicare Other | Source: Ambulatory Visit | Attending: Interventional Cardiology | Admitting: Interventional Cardiology

## 2014-06-30 ENCOUNTER — Encounter: Payer: Self-pay | Admitting: Interventional Cardiology

## 2014-06-30 VITALS — BP 103/67 | HR 80 | Ht 69.0 in | Wt 240.0 lb

## 2014-06-30 DIAGNOSIS — I2510 Atherosclerotic heart disease of native coronary artery without angina pectoris: Secondary | ICD-10-CM

## 2014-06-30 DIAGNOSIS — I480 Paroxysmal atrial fibrillation: Secondary | ICD-10-CM

## 2014-06-30 DIAGNOSIS — I4891 Unspecified atrial fibrillation: Secondary | ICD-10-CM

## 2014-06-30 DIAGNOSIS — R222 Localized swelling, mass and lump, trunk: Secondary | ICD-10-CM | POA: Diagnosis not present

## 2014-06-30 DIAGNOSIS — I10 Essential (primary) hypertension: Secondary | ICD-10-CM

## 2014-06-30 DIAGNOSIS — I509 Heart failure, unspecified: Secondary | ICD-10-CM

## 2014-06-30 DIAGNOSIS — Z7901 Long term (current) use of anticoagulants: Secondary | ICD-10-CM

## 2014-06-30 DIAGNOSIS — I5042 Chronic combined systolic (congestive) and diastolic (congestive) heart failure: Secondary | ICD-10-CM | POA: Diagnosis not present

## 2014-06-30 DIAGNOSIS — J984 Other disorders of lung: Secondary | ICD-10-CM | POA: Diagnosis not present

## 2014-06-30 MED ORDER — AMIODARONE HCL 200 MG PO TABS: 200.0000 mg | ORAL_TABLET | Freq: Every day | ORAL | Status: DC

## 2014-06-30 NOTE — Patient Instructions (Addendum)
Your physician has recommended you make the following change in your medication:    1. DECREASE AMIODARONE TO DAILY     You have been referred to  DR [facility] FOR AFIB TO CONSIDER POSSIBLE ABLATION   Your physician recommends that you schedule a follow-up appointment in:  DR [facility] IN 3 MONTHS

## 2014-06-30 NOTE — Progress Notes (Signed)
Patient ID: Aaron Berg, male   DOB: 1936/11/22, 78 y.o.   MRN: [id number]    [address] [place], [place]  [address] Phone: [phone] Fax:  [phone]  Date:  06/30/2014   ID:  Aaron Berg, DOB Aug 11, 1936, MRN [id number]  PCP:  Wyatt Haste, MD   ASSESSMENT:  1. Atrial fibrillation, status post cardioversion, , recurrent without evidence of heart failure 2. Diastolic heart failure, improved 3. Amiodarone therapy 4. Anticoagulation therapy   PLAN:  1. Decrease amiodarone to 200 mg daily 2. Patient will be cleared for upcoming spinal surgery assuming rhythm remains stable 3. Be mindful of fluid intake. Take diuretics as instructed. 4. Chest x-ray with bone detail in the left subcostal region where he has a bony mass. 5. Consult with Dr. Thompson Grayer to determine if ablation as a treatment option   SUBJECTIVE: Aaron Berg is a 78 y.o. male is doing relatively well but complains of fatigue, difficulty with balance, lower chest discomfort since falling, and has concerns that medications may be causing dizziness.   Wt Readings from Last 3 Encounters:  06/30/14 240 lb (108.863 kg)  05/24/14 230 lb (104.327 kg)  05/24/14 230 lb (104.327 kg)     Past Medical History  Diagnosis Date  . ASHD (arteriosclerotic heart disease)   . Dyslipidemia     well controlled  . Obesity   . Arthritis   . Male hypogonadism   . Gait disorder   . Hypertension   . Dysrhythmia 2003    AFib after CABG; warfarin x 6 months  . Sleep apnea     cpap  . CHF (congestive heart failure)   . Dry eyes   . Complication of anesthesia     "woke up too soon"  . Depression   . COPD (chronic obstructive pulmonary disease)     "no signs or tests"  . Headache(784.0)     silent migraines- no pain just go blind for just a minute  . Cancer     skin  . Memory loss, short term 2/15    per daughter since last surgery and placement at [facility].  "Hallucinations have stopped"     Current Outpatient Prescriptions  Medication Sig Dispense Refill  . acetaminophen (TYLENOL) 325 MG tablet Take 650 mg by mouth every 4 (four) hours as needed for mild pain or moderate pain.      [name] amiodarone (PACERONE) 200 MG tablet TAKE 1 TABLET BY MOUTH TWICE DAILY  60 tablet  0  . apixaban (ELIQUIS) 5 MG TABS tablet Take 1 tablet (5 mg total) by mouth 2 (two) times daily.  60 tablet  6  . cholecalciferol (VITAMIN D) 1000 UNITS tablet Take 1,000 Units by mouth daily.       [name] FLUoxetine (PROZAC) 10 MG capsule Take 1 capsule (10 mg total) by mouth daily.  30 capsule  3  . furosemide (LASIX) 40 MG tablet TAKE 1 TABLET BY MOUTH EVERY DAY  30 tablet  0  . meclizine (ANTIVERT) 12.5 MG tablet Take 1 tablet (12.5 mg total) by mouth 2 (two) times daily as needed for dizziness.  60 tablet  2  . metoprolol succinate (TOPROL-XL) 25 MG 24 hr tablet Take 1 tablet (25 mg total) by mouth daily.  90 tablet  1  . Multiple Vitamins-Minerals (MULTIVITAMIN WITH MINERALS) tablet Take 1 tablet by mouth daily.      . potassium chloride SA (K-DUR,KLOR-CON) 20 MEQ tablet Take 1  tablet (20 mEq total) by mouth daily.  30 tablet  0  . rosuvastatin (CRESTOR) 40 MG tablet Take 1 tablet (40 mg total) by mouth daily with breakfast. For Hyperlipdemia  90 tablet  1  . sodium chloride (OCEAN) 0.65 % SOLN nasal spray Place 1 spray into both nostrils as needed for congestion.      . Testosterone 20.25 MG/1.25GM (1.62%) GEL Apply 1 packet topically daily. Apply contents of 1 packet=1.25gm topically to each shoulder daily for hormone therapy       No current facility-administered medications for this visit.    Allergies:    Allergies  Allergen Reactions  . Procardia [Nifedipine] Rash    All over body    Social History:  The patient  reports that he quit smoking about 25 years ago. His smoking use included Cigarettes. He smoked 0.00 packs per day for 30 years. He has never used smokeless tobacco. He reports that he drinks  alcohol. He reports that he does not use illicit drugs.   ROS:  Please see the history of present illness.   No blood in stool. History of " mini strokes".   No syncope. No palpitations. Denies angina. There is no edema. All other systems reviewed and negative.   OBJECTIVE: VS:  BP 103/67  Pulse 80  Ht 5\' 9"  (1.753 m)  Wt 240 lb (108.863 kg)  BMI 35.43 kg/m2 Well nourished, well developed, in no acute distress, and obese HEENT: normal Neck: JVD flat. Carotid bruit absent  Cardiac:  normal S1, S2; IIRR; no murmur Lungs:  clear to auscultation bilaterally, no wheezing, rhonchi or rales Abd: soft, nontender, no hepatomegaly Ext: Edema absent. Pulses difficult to palpate Skin: warm and dry Neuro:  CNs 2-12 intact, no focal abnormalities noted  EKG:  A. fib with rate control       Signed, Illene Labrador III, MD 06/30/2014 3:46 PM

## 2014-07-03 ENCOUNTER — Other Ambulatory Visit: Payer: Self-pay | Admitting: Interventional Cardiology

## 2014-07-03 NOTE — Telephone Encounter (Signed)
furosemide (LASIX) 40 MG tablet  TAKE 1 TABLET BY MOUTH EVERY DAY   30 tablet   Sinclair Grooms, MD at 06/30/2014  3:46 PM Your physician has recommended you make the following change in your medication:  1. DECREASE AMIODARONE TO DAILY

## 2014-07-07 ENCOUNTER — Encounter: Payer: Self-pay | Admitting: Interventional Cardiology

## 2014-07-10 ENCOUNTER — Institutional Professional Consult (permissible substitution): Payer: PRIVATE HEALTH INSURANCE | Admitting: Diagnostic Neuroimaging

## 2014-07-10 ENCOUNTER — Other Ambulatory Visit: Payer: Self-pay | Admitting: Interventional Cardiology

## 2014-07-11 DIAGNOSIS — I2510 Atherosclerotic heart disease of native coronary artery without angina pectoris: Secondary | ICD-10-CM | POA: Diagnosis not present

## 2014-07-11 DIAGNOSIS — I4891 Unspecified atrial fibrillation: Secondary | ICD-10-CM | POA: Diagnosis not present

## 2014-07-11 DIAGNOSIS — I5040 Unspecified combined systolic (congestive) and diastolic (congestive) heart failure: Secondary | ICD-10-CM | POA: Diagnosis not present

## 2014-07-11 DIAGNOSIS — J449 Chronic obstructive pulmonary disease, unspecified: Secondary | ICD-10-CM | POA: Diagnosis not present

## 2014-07-11 DIAGNOSIS — I10 Essential (primary) hypertension: Secondary | ICD-10-CM | POA: Diagnosis not present

## 2014-08-03 ENCOUNTER — Other Ambulatory Visit: Payer: Self-pay

## 2014-08-03 ENCOUNTER — Other Ambulatory Visit: Payer: Self-pay | Admitting: Interventional Cardiology

## 2014-08-03 ENCOUNTER — Institutional Professional Consult (permissible substitution): Payer: Medicare Other | Admitting: Internal Medicine

## 2014-08-03 DIAGNOSIS — I5042 Chronic combined systolic (congestive) and diastolic (congestive) heart failure: Secondary | ICD-10-CM

## 2014-08-03 MED ORDER — POTASSIUM CHLORIDE CRYS ER 20 MEQ PO TBCR: 20.0000 meq | EXTENDED_RELEASE_TABLET | Freq: Every day | ORAL | Status: DC

## 2014-08-09 ENCOUNTER — Ambulatory Visit (INDEPENDENT_AMBULATORY_CARE_PROVIDER_SITE_OTHER): Payer: Medicare Other | Admitting: Neurology

## 2014-08-09 ENCOUNTER — Encounter: Payer: Self-pay | Admitting: Neurology

## 2014-08-09 VITALS — BP 116/79 | HR 44 | Ht 70.0 in | Wt 240.0 lb

## 2014-08-09 DIAGNOSIS — Z9181 History of falling: Secondary | ICD-10-CM | POA: Diagnosis not present

## 2014-08-09 DIAGNOSIS — R251 Tremor, unspecified: Secondary | ICD-10-CM

## 2014-08-09 DIAGNOSIS — R2689 Other abnormalities of gait and mobility: Secondary | ICD-10-CM

## 2014-08-09 DIAGNOSIS — R259 Unspecified abnormal involuntary movements: Secondary | ICD-10-CM

## 2014-08-09 DIAGNOSIS — R269 Unspecified abnormalities of gait and mobility: Secondary | ICD-10-CM

## 2014-08-09 DIAGNOSIS — I2510 Atherosclerotic heart disease of native coronary artery without angina pectoris: Secondary | ICD-10-CM | POA: Diagnosis not present

## 2014-08-09 DIAGNOSIS — R296 Repeated falls: Secondary | ICD-10-CM | POA: Insufficient documentation

## 2014-08-09 HISTORY — DX: Repeated falls: R29.6

## 2014-08-09 NOTE — Patient Instructions (Signed)
Referral to movement disorder specialist for second opinion. Dr Janann Colonel or Rexene Alberts,  ASAP.

## 2014-08-09 NOTE — Progress Notes (Addendum)
Provider:  Larey Seat, M D  Referring Provider: [name], MD Primary Care Physician:  Wyatt Haste, MD  Chief Complaint  Patient presents with  . New Evaluation    Room 11  . Dizziness    HPI:  Aaron Berg is a 78 y.o.caucasian, right handed  male , who is seen here as a referral  from Dr. Redmond School for "dizziness"  .  Mr. Aaron Berg has seen other specialists for the workup of dizziness which has been describing as has been described as waxing and waning but it never seems to completely go away. He has a previous history of benign positional vertigo but his current dizziness is not related to the position of the hand. Extensive neurologic workup in the past was negative he actually describes different types of dizziness that he had over the last several years. Dizziness seems to be a prominent and permanent part of his life  and  there is no identifiable trigger. It can occur anytime of the day- it's not related to meal times, is not related to the extent of sleep he may have gotten the night before.  The patient had a surgery in January and February of this year replacing both knees. He has noted high in the for sleep since her surgeries and states that he craves 10-12 hours of sleep per day. He has been treated in the past for low testosterone levels, by a cream- which he often forgets.   He has noted that he is completely lost of in the dark, he cannot orient himself, needs optic/ visual control, if standing in a dark room he pro pulsed- fell into the baisin in front of him.   A visiting nurse worked with him on the blue platform, he falls.  He lost proprioception.   He was  still in rehab,with his hypersomnia was note by the caretakers. He went on to outpatient PT, now completed. He has another surgery lined up, fusion in lumbar spine.  Myelogram was positive for stenosis , he reported.  He has OSA, diagnosed 18 years ago and is using CPAP since.  He has been  told by dr Erling Cruz 10 years ago, that he has essential, familiar tremor.  He has noted delayed work finding , names mostly. He needs to keep a calendar for appointments.     Review of Systems: Out of a complete 14 system review, the patient complains of only the following symptoms, and all other reviewed systems are negative.  loss  of balance, sensory, dizziness, cane for walking.   History   Social History  . Marital Status: Divorced    Spouse Name: N/A    Number of Children: 2  . Years of Education: Masters   Occupational History  . Not on file.   Social History Main Topics  . Smoking status: Former Smoker -- 30 years    Types: Cigarettes    Quit date: 06/17/1989  . Smokeless tobacco: Never Used  . Alcohol Use: 0.0 oz/week     Comment: 4-5 oz per day before dinner  . Drug Use: No  . Sexual Activity: Not Currently   Other Topics Concern  . Not on file   Social History Narrative   Patient is single and lives alone.   Patient is retired.   Patient has a Scientist, water quality.   Patient has two adult children.   Patient is right-handed.   Patient drinks one cup of coffee daily.  Family History  Problem Relation Age of Onset  . Pancreatic cancer Father   . CAD Father   . CAD Mother     Past Medical History  Diagnosis Date  . ASHD (arteriosclerotic heart disease)   . Dyslipidemia     well controlled  . Obesity   . Arthritis   . Male hypogonadism   . Gait disorder   . Hypertension   . Dysrhythmia 2003    AFib after CABG; warfarin x 6 months  . Sleep apnea     cpap  . CHF (congestive heart failure)   . Dry eyes   . Complication of anesthesia     "woke up too soon"  . Depression   . COPD (chronic obstructive pulmonary disease)     "no signs or tests"  . Headache(784.0)     silent migraines- no pain just go blind for just a minute  . Cancer     skin  . Memory loss, short term 2/15    per daughter since last surgery and placement at [facility].  "Hallucinations  have stopped"  . Sleep disorder     Past Surgical History  Procedure Laterality Date  . Lumbar cyst      Dr. Joya Salm  . Shoulder surgery      Right with murphy, reattach ligaments  . Coronary artery bypass graft  2003    x 4  . Video assisted thoracoscopy (vats)/thorocotomy  2002    benign nodule  . Joint replacement      right shoulder replaced  . Coronary angioplasty  1991    with stents placed  . Eye surgery Bilateral     cataract surgeries  . Eye surgery Right     detached retina  . Tonsillectomy  as child  . Appendectomy  as child  . Mohs surgery      on chest  . Total knee arthroplasty Right 01/02/2014    Procedure: RIGHT TOTAL KNEE ARTHROPLASTY;  Surgeon: Mauri Pole, MD;  Location: [facility];  Service: Orthopedics;  Laterality: Right;  . Total knee arthroplasty Left 01/31/2014    Procedure: LEFT TOTAL KNEE ARTHROPLASTY;  Surgeon: Mauri Pole, MD;  Location: [facility];  Service: Orthopedics;  Laterality: Left;  . Cardioversion N/A 04/26/2014    Procedure: CARDIOVERSION;  Surgeon: Sinclair Grooms, MD;  Location: [place];  Service: Cardiovascular;  Laterality: N/A;  . Cardioversion N/A 05/24/2014    Procedure: CARDIOVERSION;  Surgeon: Sinclair Grooms, MD;  Location: [facility] ENDOSCOPY;  Service: Cardiovascular;  Laterality: N/A;    Current Outpatient Prescriptions  Medication Sig Dispense Refill  . acetaminophen (TYLENOL) 325 MG tablet Take 650 mg by mouth every 4 (four) hours as needed for mild pain or moderate pain.      [name] amiodarone (PACERONE) 200 MG tablet Take 1 tablet (200 mg total) by mouth daily.  60 tablet  0  . apixaban (ELIQUIS) 5 MG TABS tablet Take 1 tablet (5 mg total) by mouth 2 (two) times daily.  60 tablet  6  . cholecalciferol (VITAMIN D) 1000 UNITS tablet Take 1,000 Units by mouth daily.       [name] FLUoxetine (PROZAC) 10 MG capsule Take 1 capsule (10 mg total) by mouth daily.  30 capsule  3  . furosemide (LASIX) 40 MG tablet TAKE 1 TABLET BY MOUTH DAILY   30 tablet  3  . meclizine (ANTIVERT) 12.5 MG tablet Take 1 tablet (12.5 mg total) by mouth 2 (two)  times daily as needed for dizziness.  60 tablet  2  . metoprolol succinate (TOPROL-XL) 25 MG 24 hr tablet Take 1 tablet (25 mg total) by mouth daily.  90 tablet  1  . Multiple Vitamins-Minerals (MULTIVITAMIN WITH MINERALS) tablet Take 1 tablet by mouth daily.      . potassium chloride SA (K-DUR,KLOR-CON) 20 MEQ tablet Take 1 tablet (20 mEq total) by mouth daily.  30 tablet  0  . rosuvastatin (CRESTOR) 40 MG tablet Take 1 tablet (40 mg total) by mouth daily with breakfast. For Hyperlipdemia  90 tablet  1  . sodium chloride (OCEAN) 0.65 % SOLN nasal spray Place 1 spray into both nostrils as needed for congestion.      . Testosterone 20.25 MG/1.25GM (1.62%) GEL Apply 1 packet topically daily. Apply contents of 1 packet=1.25gm topically to each shoulder daily for hormone therapy       No current facility-administered medications for this visit.    Allergies as of 08/09/2014 - Review Complete 08/09/2014  Allergen Reaction Noted  . Procardia [nifedipine] Rash 07/09/2011    Vitals: BP 116/79  Pulse 44  Ht 5\' 10"  (1.778 m)  Wt 240 lb (108.863 kg)  BMI 34.44 kg/m2 Last Weight:  Wt Readings from Last 1 Encounters:  08/09/14 240 lb (108.863 kg)   Last Height:   Ht Readings from Last 1 Encounters:  08/09/14 5\' 10"  (1.778 m)    Physical exam:  General: The patient is awake, alert and appears not in acute distress.  Head: Normocephalic, atraumatic. Neck is supple. Mallampati 4, neck circumference:18.5  Cardiovascular:  Regular rate and rhythm , without  murmurs or carotid bruit, and without distended neck veins. Respiratory: Lungs are clear to auscultation. Skin:  Without evidence of edema, or rash Trunk: BMI is  elevated and patient  has normal posture.  Neurologic exam : The patient is awake and alert, oriented to place and time.  Memory subjective  described as impaired - word finding  delays.   There is a normal attention span & concentration ability. Speech is fluent without ysarthria, but with dysphonia Mood and affect are appropriate.  Cranial nerves: Pupils are equal and briskly reactive to light. Funduscopic exam without  evidence of pallor or edema.  Extraocular movements  in vertical and horizontal planes intact and without nystagmus.  Visual fields by finger perimetry are intact. Hearing to finger rub intact.   Facial sensation intact to fine touch. Facial motor strength is symmetric and tongue  moves midline.  Tongue protrusion into either cheek is normal. Shoulder shrug is normal.   Motor exam:   Elevated tone in the right arm biceps and wrist. Dominant hand -muscle bulk and symmetric strength in all extremities.  Right shoulder is lower, shrug is weaker due to replacement surgery.  Sensory: No pain,  Fine touch, pinprick and vibration were tested in all extremities. Vibratory sense was intact, but temperature was reduced,  Proprioception was reduced , he feels an inner trembling - and burning dysesthesias in the thigh, up to the hips.  Coordination: Rapid alternating movements in the fingers/hands were normal.  Finger-to-nose maneuver normal without evidence of ataxia, but dysmetria and mild tremor. Gait and station: Patient walks without assistive device and needs some assistance to climb up to the exam table.  He can not walk on gravel, sand or grass.  Small steps, shuffling , unable to turn, difficulties to step on the stepstool. Tandem gait is impossible, cannot stand on one leg.  Romberg  testing is propulsive.  Deep tendon reflexes: in the  upper and lower extremities are symmetric - attenuated.  Babinski maneuver response is attenuated .   Assessment:  After physical and neurologic examination, review of laboratory studies, imaging, neurophysiology testing and pre-existing records, assessment is that of :  Non vertigo dizziness, but the patient has a  severe fear of falling, and has fallen forward or backward.  There  is a postural tremor, shuffling gait, unsteadiness, worsened since knee surgeries bilaterally. No cerebellar eye movement  abnormalities, but dysmetria and tremor in both hands.  No urinary incontinence, no dementia.  GDS score was 6- depression and anxiety, loss of interest and energy is evident.   Plan:  Treatment plan and additional workup : CT head , rule out NPH. PD evaluation with Dr Rexene Alberts or Janann Colonel , He may have essential tremor. He has a tremor in the right arm for 10 years .   [name] Inessa Wardrop MD 08/09/2014

## 2014-08-10 LAB — CK TOTAL AND CKMB (NOT AT ARMC)
CK-MB Index: 1 ng/mL (ref 0.0–5.0)
Total CK: 57 U/L (ref 24–204)

## 2014-08-14 ENCOUNTER — Ambulatory Visit: Payer: Medicare Other | Admitting: Neurology

## 2014-08-14 ENCOUNTER — Telehealth: Payer: Self-pay | Admitting: Neurology

## 2014-08-14 ENCOUNTER — Other Ambulatory Visit: Payer: Self-pay | Admitting: Family Medicine

## 2014-08-14 NOTE — Telephone Encounter (Signed)
Is this okay?

## 2014-08-14 NOTE — Telephone Encounter (Signed)
Patient no showed for an appointment today, 08/14/2014, at 0930.

## 2014-08-15 ENCOUNTER — Ambulatory Visit (INDEPENDENT_AMBULATORY_CARE_PROVIDER_SITE_OTHER): Payer: Medicare Other | Admitting: Neurology

## 2014-08-15 ENCOUNTER — Encounter: Payer: Self-pay | Admitting: Neurology

## 2014-08-15 VITALS — BP 99/70 | HR 41 | Temp 98.4°F | Ht 69.0 in | Wt 239.0 lb

## 2014-08-15 DIAGNOSIS — E559 Vitamin D deficiency, unspecified: Secondary | ICD-10-CM | POA: Diagnosis not present

## 2014-08-15 DIAGNOSIS — I2510 Atherosclerotic heart disease of native coronary artery without angina pectoris: Secondary | ICD-10-CM | POA: Diagnosis not present

## 2014-08-15 DIAGNOSIS — Z9181 History of falling: Secondary | ICD-10-CM

## 2014-08-15 DIAGNOSIS — G250 Essential tremor: Secondary | ICD-10-CM

## 2014-08-15 DIAGNOSIS — Z9989 Dependence on other enabling machines and devices: Secondary | ICD-10-CM

## 2014-08-15 DIAGNOSIS — R2689 Other abnormalities of gait and mobility: Secondary | ICD-10-CM

## 2014-08-15 DIAGNOSIS — G609 Hereditary and idiopathic neuropathy, unspecified: Secondary | ICD-10-CM

## 2014-08-15 DIAGNOSIS — R269 Unspecified abnormalities of gait and mobility: Secondary | ICD-10-CM | POA: Diagnosis not present

## 2014-08-15 DIAGNOSIS — G4733 Obstructive sleep apnea (adult) (pediatric): Secondary | ICD-10-CM | POA: Diagnosis not present

## 2014-08-15 DIAGNOSIS — G252 Other specified forms of tremor: Secondary | ICD-10-CM

## 2014-08-15 DIAGNOSIS — R42 Dizziness and giddiness: Secondary | ICD-10-CM | POA: Diagnosis not present

## 2014-08-15 DIAGNOSIS — R296 Repeated falls: Secondary | ICD-10-CM

## 2014-08-15 NOTE — Progress Notes (Signed)
Subjective:    Patient ID: Aaron Berg is a 78 y.o. male.  HPI    Star Age, MD, PhD [facility] [address] [place], [place] [address]  Dear [name],   I saw your patient, Aaron Berg, upon your kind request in my clinic today for second opinion of his tremors, concern for parkinsonism. The patient is unaccompanied today. As you know, [name] is a 78 year old right-handed gentleman with an underlying complex medical history of hyperlipidemia, alcohol abuse in the past, obesity, arthritis, hypertension, heart disease, status post CABG, congestive heart failure, atrial fibrillation, skin cancer, depression, COPD, osteoarthritis, status post right total knee arthroplasty in January 2015, status post left total knee arthroplasty in February 2015, history of cardioversion, multiple other surgeries including right shoulder replacement surgery, detached retina repair, cataract surgeries, angioplasty and CABG four-vessel in 2003, tonsillectomy and appendectomy, memory loss, and obstructive sleep apnea on CPAP, who has had an upper extremity tremor for the past several years, perhaps as long as 10 years. He had seen Dr. Erling Cruz many years ago and was told he had essential tremor. He reports problems with his balance and he has dizziness for years. He missed his appointment with me yesterday, as he fell down the lower steps of his motor home and could not get up. He had his phone with him and called EMS. Of note, he has been on Prozac for the past several months. He has fallen multiple times since his TKAs. He has had some LE edema. He has a history of excessive alcohol use for years and usually has about 4 oz of liquor. He lives alone, has 2 grown daughters, who want him to get a call alert button. He is looking into it.   His Past Medical History Is Significant For: Past Medical History  Diagnosis Date  . ASHD (arteriosclerotic heart disease)   .  Dyslipidemia     well controlled  . Obesity   . Arthritis   . Male hypogonadism   . Gait disorder   . Hypertension   . Dysrhythmia 2003    AFib after CABG; warfarin x 6 months  . Sleep apnea     cpap  . CHF (congestive heart failure)   . Dry eyes   . Complication of anesthesia     "woke up too soon"  . Depression   . COPD (chronic obstructive pulmonary disease)     "no signs or tests"  . Headache(784.0)     silent migraines- no pain just go blind for just a minute  . Cancer     skin  . Memory loss, short term 2/15    per daughter since last surgery and placement at [facility].  "Hallucinations have stopped"  . Sleep disorder   . Multiple falls 08/09/2014    His Past Surgical History Is Significant For: Past Surgical History  Procedure Laterality Date  . Lumbar cyst      Dr. Joya Salm  . Shoulder surgery      Right with murphy, reattach ligaments  . Coronary artery bypass graft  2003    x 4  . Video assisted thoracoscopy (vats)/thorocotomy  2002    benign nodule  . Joint replacement      right shoulder replaced  . Coronary angioplasty  1991    with stents placed  . Eye surgery Bilateral     cataract surgeries  . Eye surgery Right     detached retina  .  Tonsillectomy  as child  . Appendectomy  as child  . Mohs surgery      on chest  . Total knee arthroplasty Right 01/02/2014    Procedure: RIGHT TOTAL KNEE ARTHROPLASTY;  Surgeon: Mauri Pole, MD;  Location: [facility];  Service: Orthopedics;  Laterality: Right;  . Total knee arthroplasty Left 01/31/2014    Procedure: LEFT TOTAL KNEE ARTHROPLASTY;  Surgeon: Mauri Pole, MD;  Location: [facility];  Service: Orthopedics;  Laterality: Left;  . Cardioversion N/A 04/26/2014    Procedure: CARDIOVERSION;  Surgeon: Sinclair Grooms, MD;  Location: [facility];  Service: Cardiovascular;  Laterality: N/A;  . Cardioversion N/A 05/24/2014    Procedure: CARDIOVERSION;  Surgeon: Sinclair Grooms, MD;  Location: [facility] ENDOSCOPY;  Service:  Cardiovascular;  Laterality: N/A;    His Family History Is Significant For: Family History  Problem Relation Age of Onset  . Pancreatic cancer Father   . CAD Father   . CAD Mother     His Social History Is Significant For: History   Social History  . Marital Status: Divorced    Spouse Name: N/A    Number of Children: 2  . Years of Education: Masters   Occupational History  .      retired   Social History Main Topics  . Smoking status: Former Smoker -- 30 years    Types: Cigarettes    Quit date: 06/17/1989  . Smokeless tobacco: Never Used  . Alcohol Use: 0.0 oz/week     Comment: 4-5 oz per day before dinner  . Drug Use: No  . Sexual Activity: Not Currently   Other Topics Concern  . None   Social History Narrative   Patient is single and lives alone.   Patient is retired.   Patient has a Scientist, water quality.   Patient has two adult children.   Patient is right-handed.   Patient drinks one cup of coffee daily.    His Allergies Are:  Allergies  Allergen Reactions  . Procardia [Nifedipine] Rash    All over body  :   His Current Medications Are:  Outpatient Encounter Prescriptions as of 08/15/2014  Medication Sig  . acetaminophen (TYLENOL) 325 MG tablet Take 650 mg by mouth every 4 (four) hours as needed for mild pain or moderate pain.  [name] amiodarone (PACERONE) 200 MG tablet Take 1 tablet (200 mg total) by mouth daily.  [name] apixaban (ELIQUIS) 5 MG TABS tablet Take 1 tablet (5 mg total) by mouth 2 (two) times daily.  . cholecalciferol (VITAMIN D) 1000 UNITS tablet Take 1,000 Units by mouth daily.   [name] FLUoxetine (PROZAC) 10 MG capsule Take 1 capsule (10 mg total) by mouth daily.  . furosemide (LASIX) 40 MG tablet TAKE 1 TABLET BY MOUTH DAILY  . meclizine (ANTIVERT) 12.5 MG tablet TAKE 1 TABLET BY MOUTH TWICE DAILY AS NEEDED FOR DIZZINESS  . metoprolol succinate (TOPROL-XL) 25 MG 24 hr tablet Take 1 tablet (25 mg total) by mouth daily.  . Multiple Vitamins-Minerals  (MULTIVITAMIN WITH MINERALS) tablet Take 1 tablet by mouth daily.  . potassium chloride SA (K-DUR,KLOR-CON) 20 MEQ tablet Take 1 tablet (20 mEq total) by mouth daily.  . rosuvastatin (CRESTOR) 40 MG tablet Take 1 tablet (40 mg total) by mouth daily with breakfast. For Hyperlipdemia  . sodium chloride (OCEAN) 0.65 % SOLN nasal spray Place 1 spray into both nostrils as needed for congestion.  . Testosterone 20.25 MG/1.25GM (1.62%) GEL Apply 1 packet topically  daily. Apply contents of 1 packet=1.25gm topically to each shoulder daily for hormone therapy  : Review of Systems:  Out of a complete 14 point review of systems, all are reviewed and negative with the exception of these symptoms as listed below:   Review of Systems  Constitutional: Positive for fatigue.  HENT:       Ringing in ears  Eyes: Positive for itching.  Musculoskeletal: Positive for back pain.       Joint pain  Neurological: Positive for dizziness and numbness.       Daytime sleepiness, apnea,memory loss  Psychiatric/Behavioral:       Depression    Objective:  Neurologic Exam  Physical Exam Physical Examination:   Filed Vitals:   08/15/14 1120  BP: 99/70  Pulse: 41  Temp: 98.4 F (36.9 C)   Repeat sitting: 100/66, 42 and 1 min standing: 113/68, 41, mild unsteadiness noted, no orthostatic lightheadedness.   General Examination: The patient is a very pleasant 78 y.o. male in no acute distress.  HEENT: Normocephalic, atraumatic, pupils are equal, round and reactive to light and accommodation. Funduscopic exam is normal with sharp disc margins noted. He is s/p cataract repair. Extraocular tracking shows mild saccadic breakdown without nystagmus noted. There is limitation to upper gaze. There is no significant decrease in eye blink rate. Hearing is intact. Tympanic membranes are clear bilaterally. Face is symmetric with no facial masking and normal facial sensation. There is no lip, neck or jaw tremor. Neck is minimally  rigid with intact passive ROM. There are no carotid bruits on auscultation. Oropharynx exam reveals moderate mouth dryness. Moderate airway crowding is noted. Mallampati is class II. Tongue protrudes centrally and palate elevates symmetrically.   There is drooling.   Chest: is clear to auscultation without wheezing, rhonchi or crackles noted.  Heart: sounds are regular and normal without murmurs, rubs or gallops noted.   Abdomen: is soft, non-tender and non-distended with normal bowel sounds appreciated on auscultation.  Extremities: There is 1+ pitting edema in the distal lower extremities bilaterally. Pedal pulses are intact. Chronic stasis-like changes are noted in the distal legs bilaterally. There are no varicose veins.  Skin: is warm and dry with no trophic changes noted. Age-related changes are noted on the skin.   Musculoskeletal: exam reveals redness on both knees. He has unremarkable scars but it looks like he had a recent fall but no open wounds and no obvious joint deformities. He has mild erythema.   Neurologically:  Mental status: The patient is awake and alert, paying good  attention. He is able to completely provide the history. He is oriented to: person, place, time/date, situation, day of week, month of year and year. His memory, attention, language and knowledge are fairly well intact. There is no aphasia, agnosia, apraxia or anomia. There is no evidence of bradyphrenia. Speech is mildly hypophonic with no dysarthria noted. Mood is congruent and affect is normal.  Cranial nerves are as described above under HEENT exam. In addition, shoulder shrug is normal with equal shoulder height noted.  Motor exam: Normal bulk, and strength for age is noted. There are no dyskinesias noted.  Tone is not rigid with absence of cogwheeling in the limbs. There is overall mild bradykinesia. There is no drift or rebound.  There is no resting tremor. There is a b/l UE action and postural tremor.   Romberg is positive with unsteadiness noted.   Reflexes are 1+ in the upper extremities and trace in the knees  and absent in the ankle.  Fine motor skills exam: Finger taps, hand movements, rapid alternating patting, foot agility and foot tapping are all mildly impaired without lateralization.   Cerebellar testing shows no dysmetria or intention tremor on finger to nose testing. Heel to shin is not possible due to limitation in range of motion in his knees. There is no truncal ataxia.   Sensory exam is intact to light touch, pinprick, vibration, temperature sense and proprioception in the upper extremities but decrease to temperature and light touch in the distal lower extremities up to 1 handbreadth above his ankles.   Gait, station and balance: He stands up from the seated position with mild difficulty and does need to push up with His hands, he has discomfort in both knees . He needs no assistance. No veering to one side is noted. He is not noted to lean to the side. Posture is mildly stooped, perhaps a little advanced for age. Stance is wide-based. He walks slowly and cautiously and while he does not pick up his feet very well he does not have a typical Parkinsonian shuffle and does not have a magnetic gait either. He walks wide-based. He looks quite insecure walking. He has trouble with turns. I did not check tandem walk because of gait insecurity and instability.   Assessment and Plan:   In summary, MIKEY MAFFETT is a very pleasant 78 y.o.-year old male with an underlying complex medical history of hyperlipidemia, obesity, arthritis, hypertension, heart disease, status post CABG, congestive heart failure, atrial fibrillation, skin cancer, depression, prior alcohol abuse and regular daily alcohol use, but none since his rehab for his TKAs, COPD, osteoarthritis, status post right total knee arthroplasty in January 2015, status post left total knee arthroplasty in February 2015, history of  cardioversion, multiple other surgeries including right shoulder replacement surgery, detached retina repair, cataract surgeries, angioplasty and CABG four-vessel in 2003, tonsillectomy and appendectomy, memory loss, and obstructive sleep apnea on CPAP, who has had  a long-standing history of tremors. On examination he has evidence of essential tremor. He has no parkinsonian signs or symptoms. He does have complaints of dizziness and balance problems for years. I do believe his gait disorder he is multifactorial in nature secondary to multiple issues including mild neuropathy, history of daily alcohol use for years, degenerative arthritis affecting major joints, mild dehydration, deconditioning, prior falls with injuries and fear of falling. I talked to the patient at length today about his condition and my findings. He is advised that he does not have any parkinsonian features. I do agree with proceeding with a head CT and this is pending as I understand. He has not yet been contacted for this. I would recommend consideration of physical therapy and let you decide to go through with physical therapy. He would benefit from gait and balance training with outpatient physical therapy. He is advised to use his cane at all times. He's also advised to change positions slowly and stay better hydrated. I willfallcheck TSH and vitamin B one today. He is advised to check with his primary care physician on his B12 status and his vitamin D level. He does take over-the-counter vitamin D supplements. He will see you back as previously scheduled. I do not need to see him back on a regular basis.  Thank you very much for allowing me to participate in the care of this nice patient.  Sincerely,   Star Age, MD, PhD

## 2014-08-15 NOTE — Patient Instructions (Addendum)
  Please remember, that any kind of tremor may be exacerbated by anxiety, anger, nervousness, excitement, dehydration, sleep deprivation, by caffeine, and low blood sugar values or blood sugar fluctuations. Some medications, especially some antidepressants and lithium can cause or exacerbate tremors. Tremors may temporarily calm down her subside with the use of a benzodiazepine such as Valium or related medications and with alcohol. Be aware however that drinking alcohol is not an approved treatment or appropriate treatment for tremor control and long-term use of benzodiazepines such as Valium, lorazepam, alprazolam, or clonazepam can cause habit formation, physical and psychological addiction.  I will give my recommendations to Dr. Brett Fairy. You can follow up with her as previously planned.    I believe you have a gait disorder, which likely is due to a combination of things: dehydration, normal aging, prior daily alcohol use, degenerative arthritis of your joints, possible atherosclerosis of the blood vessels in your brain. We will do a CT head as planned by Dr. Brett Fairy.   Remember to drink plenty of fluid, eat healthy meals and do not skip any meals. Try to eat protein with a every meal and eat a healthy snack such as fruit or nuts in between meals. Try to keep a regular sleep-wake schedule and try to exercise daily, particularly in the form of walking, 20-30 minutes a day, if you can. Change positions slowly and you should start using a cane. I will recommend that Dr. Brett Fairy order Physical therapy.   Have Dr. Redmond School check your B12 level and your vitamin D level next time.

## 2014-08-17 LAB — VITAMIN B1, WHOLE BLOOD: THIAMINE: 122.1 nmol/L (ref 66.5–200.0)

## 2014-08-17 LAB — TSH: TSH: 1.12 u[IU]/mL (ref 0.450–4.500)

## 2014-08-17 LAB — HGB A1C W/O EAG: Hgb A1c MFr Bld: 6.1 % — ABNORMAL HIGH (ref 4.8–5.6)

## 2014-08-17 NOTE — Progress Notes (Signed)
Quick Note:  Please advise patient that his thyroid screening test and vitamin B1 levels were fine. Hemoglobin A1c which is a marker for diabetes was borderline elevated, this does not mean he is frankly diabetic but may be at risk for diabetes. Followup with primary care physician as previously planned and with Dr. Brett Fairy as discussed.  Star Age, MD, PhD [facility] ([facility]  ______

## 2014-08-30 ENCOUNTER — Ambulatory Visit (INDEPENDENT_AMBULATORY_CARE_PROVIDER_SITE_OTHER): Payer: Medicare Other | Admitting: Internal Medicine

## 2014-08-30 ENCOUNTER — Encounter: Payer: Self-pay | Admitting: Internal Medicine

## 2014-08-30 VITALS — BP 102/70 | HR 87 | Ht 69.0 in | Wt 236.0 lb

## 2014-08-30 DIAGNOSIS — I4891 Unspecified atrial fibrillation: Secondary | ICD-10-CM | POA: Diagnosis not present

## 2014-08-30 DIAGNOSIS — I2510 Atherosclerotic heart disease of native coronary artery without angina pectoris: Secondary | ICD-10-CM

## 2014-08-30 DIAGNOSIS — I10 Essential (primary) hypertension: Secondary | ICD-10-CM

## 2014-08-30 DIAGNOSIS — I5042 Chronic combined systolic (congestive) and diastolic (congestive) heart failure: Secondary | ICD-10-CM

## 2014-08-30 DIAGNOSIS — I255 Ischemic cardiomyopathy: Secondary | ICD-10-CM

## 2014-08-30 DIAGNOSIS — I509 Heart failure, unspecified: Secondary | ICD-10-CM

## 2014-08-30 DIAGNOSIS — I480 Paroxysmal atrial fibrillation: Secondary | ICD-10-CM

## 2014-08-30 DIAGNOSIS — E669 Obesity, unspecified: Secondary | ICD-10-CM

## 2014-08-30 DIAGNOSIS — I2589 Other forms of chronic ischemic heart disease: Secondary | ICD-10-CM | POA: Diagnosis not present

## 2014-08-30 DIAGNOSIS — G4730 Sleep apnea, unspecified: Secondary | ICD-10-CM

## 2014-08-30 NOTE — Progress Notes (Signed)
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 male with a h/o persistent atrial fibrillation who presents for EP consultation.  He reports initially being diagnosed with atrial fibrillation in 2003 after bypass surgery.  He did very well until 4/15 when he had recurrent afib following knee surgery.  He has been in persistent atrial fibrillation since that time.  He is appropriately anticoagulated with xarelto.  He has also been placed on amiodarone.  He was recently cardioverted but quickly returned to afib.   He reports symptoms of fatigue.  This has been present since his knee surgery.  He is not sure if his symptoms are due to afib or deconditioning due to decreased activity since his knee surgery.  Today, he denies symptoms of palpitations, chest pain, shortness of breath, orthopnea, PND,  dizziness, presyncope, syncope, or neurologic sequela.  + dependant edema. The patient is tolerating medications without difficulties and is otherwise without complaint today.   Past Medical History  Diagnosis Date  . CAD (coronary artery disease) of bypass graft   . Dyslipidemia     well controlled  . Obesity   . Arthritis   . Male hypogonadism   . Gait disorder   . Hypertension   . Persistent atrial fibrillation 2003  . Sleep apnea     cpap  . CHF (congestive heart failure)   . Dry eyes   . Complication of anesthesia     "woke up too soon"  . Depression   . COPD (chronic obstructive pulmonary disease)     "no signs or tests"  . Headache(784.0)     silent migraines- no pain just go blind for just a minute  . Cancer     skin  . Memory loss, short term 2/15    per daughter since last surgery and placement at [facility].  "Hallucinations have stopped"  . Sleep disorder   . Multiple falls 08/09/2014  . Chronic systolic dysfunction of left ventricle   . Cerebrovascular disease 2006    multiple ischemic changes on prior MRI   Past  Surgical History  Procedure Laterality Date  . Lumbar cyst      Dr. Joya Salm  . Shoulder surgery      Right with murphy, reattach ligaments  . Coronary artery bypass graft  2003    x 4  . Video assisted thoracoscopy (vats)/thorocotomy  2002    benign nodule  . Joint replacement      right shoulder replaced  . Coronary angioplasty  1991    with stents placed  . Eye surgery Bilateral     cataract surgeries  . Eye surgery Right     detached retina  . Tonsillectomy  as child  . Appendectomy  as child  . Mohs surgery      on chest  . Total knee arthroplasty Right 01/02/2014    Procedure: RIGHT TOTAL KNEE ARTHROPLASTY;  Surgeon: Mauri Pole, MD;  Location: [facility];  Service: Orthopedics;  Laterality: Right;  . Total knee arthroplasty Left 01/31/2014    Procedure: LEFT TOTAL KNEE ARTHROPLASTY;  Surgeon: Mauri Pole, MD;  Location: [facility];  Service: Orthopedics;  Laterality: Left;  . Cardioversion N/A 04/26/2014    Procedure: CARDIOVERSION;  Surgeon: Sinclair Grooms, MD;  Location: [place];  Service: Cardiovascular;  Laterality: N/A;  . Cardioversion N/A 05/24/2014    Procedure: CARDIOVERSION;  Surgeon: Sinclair Grooms, MD;  Location: [facility];  Service: Cardiovascular;  Laterality: N/A;    Current Outpatient Prescriptions  Medication Sig Dispense Refill  . acetaminophen (TYLENOL) 325 MG tablet Take 650 mg by mouth every 4 (four) hours as needed for mild pain or moderate pain.      [name] amiodarone (PACERONE) 200 MG tablet Take 1 tablet (200 mg total) by mouth daily.  60 tablet  0  . apixaban (ELIQUIS) 5 MG TABS tablet Take 1 tablet (5 mg total) by mouth 2 (two) times daily.  60 tablet  6  . cholecalciferol (VITAMIN D) 1000 UNITS tablet Take 1,000 Units by mouth daily.       [name] FLUoxetine (PROZAC) 10 MG capsule Take 1 capsule (10 mg total) by mouth daily.  30 capsule  3  . furosemide (LASIX) 40 MG tablet TAKE 1 TABLET BY MOUTH DAILY  30 tablet  3  . meclizine (ANTIVERT) 12.5 MG  tablet TAKE 1 TABLET BY MOUTH TWICE DAILY AS NEEDED FOR DIZZINESS  60 tablet  0  . metoprolol succinate (TOPROL-XL) 25 MG 24 hr tablet Take 1 tablet (25 mg total) by mouth daily.  90 tablet  1  . Multiple Vitamins-Minerals (MULTIVITAMIN WITH MINERALS) tablet Take 1 tablet by mouth daily.      . potassium chloride SA (K-DUR,KLOR-CON) 20 MEQ tablet Take 1 tablet (20 mEq total) by mouth daily.  30 tablet  0  . rosuvastatin (CRESTOR) 40 MG tablet Take 1 tablet (40 mg total) by mouth daily with breakfast. For Hyperlipdemia  90 tablet  1  . sodium chloride (OCEAN) 0.65 % SOLN nasal spray Place 1 spray into both nostrils as needed for congestion.      . Testosterone 20.25 MG/1.25GM (1.62%) GEL Apply 1 packet topically daily. Apply contents of 1 packet=1.25gm topically to each shoulder daily for hormone therapy       No current facility-administered medications for this visit.    Allergies  Allergen Reactions  . Procardia [Nifedipine] Rash    All over body    History   Social History  . Marital Status: Divorced    Spouse Name: N/A    Number of Children: 2  . Years of Education: Masters   Occupational History  .      retired   Social History Main Topics  . Smoking status: Former Smoker -- 30 years    Types: Cigarettes    Quit date: 06/17/1989  . Smokeless tobacco: Never Used  . Alcohol Use: 0.0 oz/week     Comment: 4-5 oz per day before dinner  . Drug Use: No  . Sexual Activity: Not Currently   Other Topics Concern  . Not on file   Social History Narrative   Patient is single and lives alone.   Patient is retired.   Patient has a Scientist, water quality.   Patient has two adult children.   Patient is right-handed.   Patient drinks one cup of coffee daily.      Pt lives in [place] in his RV alone.   Retired Engineer, civil (consulting)    Family History  Problem Relation Age of Onset  . Pancreatic cancer Father   . CAD Father   . CAD Mother     ROS- All systems are reviewed and  negative except as per the HPI above  Physical Exam: Filed Vitals:   08/30/14 1459  BP: 102/70  Pulse: 87  Height: 5\' 9"  (1.753 m)  Weight: 236 lb (107.049 kg)    GEN- The  patient is well appearing, alert and oriented x 3 today.   Head- normocephalic, atraumatic Eyes-  Sclera clear, conjunctiva pink Ears- hearing intact Oropharynx- clear Neck- supple,  Lungs- Clear to ausculation bilaterally, normal work of breathing Heart- irregular rate and rhythm  GI- soft, NT, ND, + BS Extremities- no clubbing, cyanosis, or edema MS- no significant deformity or atrophy Skin- no rash or lesion Psych- euthymic mood, full affect Neuro- strength and sensation are intact  EKG today reveals typical appearing atrial flutter,  V rate 87 bpm, nonspecific ST/T hanges, PVCs Epic records including Dr Thompson Caul and Trin[facility] Curet notes are reviewed Echo 04/20/14- EF 45-50%, LA size 48 mm  Assessment and Plan:  1. Persistent atrial fibrillation and atrial flutter The patient has symptomatic atrial arrhythmias.  He has failed medical therapy with amiodarone.  Therapeutic strategies for afib including rate control and ablation were discussed in detail with the patient today. Risk, benefits, and alternatives to EP study and radiofrequency ablation for afib were also discussed in detail today. These risks include but are not limited to stroke, bleeding, vascular damage, tamponade, perforation, damage to the esophagus, lungs, and other structures, pulmonary vein stenosis, worsening renal function, and death. The patient understands these risk and wishes to further contemplate this option.  He would like to return in 3 months for additional consideration.   I will stop amiodarone today His chads2vasc score is at least 4.  He is appropriately anticoagulated and presently appears rate controlled.  2. OSA Compliance with CPAP is encouraged  3. CAD No ischemic symptoms No changes today  4. Chronic systolic and  diastolic dysfunction EF is mildly depressed No changes today  5. Hypertensive cardiovascular disease with CHF Stable No change required today  6. Obesity Body mass index is 34.84 kg/(m^2). Weight loss is advised  Return to the afib clinic in 3 months Follow-up with Dr Tamala Julian as scheduled

## 2014-08-30 NOTE — Patient Instructions (Signed)
Your physician wants you to follow-up in: 3 months wit Dr Vallery Ridge will receive a reminder letter in the mail two months in advance. If you don't receive a letter, please call our office to schedule the follow-up appointment.   Your physician has recommended you make the following change in your medication:  1) Stop Amiodarone

## 2014-08-31 ENCOUNTER — Other Ambulatory Visit: Payer: Self-pay | Admitting: Interventional Cardiology

## 2014-09-01 ENCOUNTER — Encounter: Payer: Self-pay | Admitting: Internal Medicine

## 2014-09-13 ENCOUNTER — Encounter: Payer: Self-pay | Admitting: Adult Health

## 2014-09-13 ENCOUNTER — Ambulatory Visit (INDEPENDENT_AMBULATORY_CARE_PROVIDER_SITE_OTHER): Payer: Medicare Other | Admitting: Adult Health

## 2014-09-13 VITALS — BP 99/64 | HR 82 | Ht 69.0 in | Wt 239.0 lb

## 2014-09-13 DIAGNOSIS — G250 Essential tremor: Secondary | ICD-10-CM

## 2014-09-13 DIAGNOSIS — Z9181 History of falling: Secondary | ICD-10-CM | POA: Diagnosis not present

## 2014-09-13 DIAGNOSIS — R296 Repeated falls: Secondary | ICD-10-CM

## 2014-09-13 DIAGNOSIS — I2510 Atherosclerotic heart disease of native coronary artery without angina pectoris: Secondary | ICD-10-CM

## 2014-09-13 DIAGNOSIS — R42 Dizziness and giddiness: Secondary | ICD-10-CM

## 2014-09-13 DIAGNOSIS — G252 Other specified forms of tremor: Secondary | ICD-10-CM | POA: Diagnosis not present

## 2014-09-13 NOTE — Progress Notes (Signed)
PATIENT: Aaron Berg DOB: May 01, 1936  REASON FOR VISIT: follow up HISTORY FROM: patient  HISTORY OF PRESENT ILLNESS: Mr. Aaron Berg is a 78 year old male with a history of essential tremor, dizziness and frequent falls. He returns today for follow-up. He was recently seen by Dr. Rexene Alberts who determined that his tremor was in fact an essential tremor and not a feature of parkinsonism. He states that he has been having dizziness since his prior surgery and rehab. According to the prior notes he has had a thorough workup for dizziness but has there has been no identifiable causes. He states that when the dizziness happens if he holds onto something it helps to resolve it. He states that in the dark it is very severe. He walks with a cane to stabilize him when the dizziness occurs. In the past he has had frequent falls. He states that at his home he usually has to hold on to the wall or furniture to keep from falling if he doesn't use his cane.                                                                                                                                                                                                                                                                                                                                                                              HISTORY 08/15/14 (SA):saw your patient, Aaron Berg, upon your kind request in my clinic today for second opinion of his tremors, concern for parkinsonism. The patient is unaccompanied today. As you know, Aaron Berg is a 78 year old right-handed gentleman with an underlying complex medical history of hyperlipidemia, alcohol abuse in the past, obesity, arthritis, hypertension, heart disease,  status post CABG, congestive heart failure, atrial fibrillation, skin cancer, depression, COPD, osteoarthritis, status post right total knee arthroplasty in January 2015, status post left total knee arthroplasty in February  2015, history of cardioversion, multiple other surgeries including right shoulder replacement surgery, detached retina repair, cataract surgeries, angioplasty and CABG four-vessel in 2003, tonsillectomy and appendectomy, memory loss, and obstructive sleep apnea on CPAP, who has had an upper extremity tremor for the past several years, perhaps as long as 10 years. He had seen Dr. Erling Cruz many years ago and was told he had essential tremor.  He reports problems with his balance and he has dizziness for years. He missed his appointment with me yesterday, as he fell down the lower steps of his motor home and could not get up. He had his phone with him and called EMS.  Of note, he has been on Prozac for the past several months. He has fallen multiple times since his TKAs. He has had some LE edema. He has a history of excessive alcohol use for years and usually has about 4 oz of liquor. He lives alone, has 2 grown daughters, who want him to get a call alert button. He is looking into it.     HISTORY 08/09/14 (CD): Aaron Berg is a 78 y.o.caucasian, right handed male , who is seen here as a referral from Dr. Redmond School for "dizziness" .  Aaron Berg has seen other specialists for the workup of dizziness which has been describing as has been described as waxing and waning but it never seems to completely go away. He has a previous history of benign positional vertigo but his current dizziness is not related to the position of the hand. Extensive neurologic workup in the past was negative he actually describes different types of dizziness that he had over the last several years.  Dizziness seems to be a prominent and permanent part of his life and there is no identifiable trigger. It can occur anytime of the day- it's not related to meal times, is not related to the extent of sleep he may have gotten the night before.  The patient had a surgery in January and February of this year replacing both knees. He has noted high  in the for sleep since her surgeries and states that he craves 10-12 hours of sleep per day. He has been treated in the past for low testosterone levels, by a cream- which he often forgets.  He has noted that he is completely lost of in the dark, he cannot orient himself, needs optic/ visual control, if standing in a dark room he pro pulsed- fell into the baisin in front of him.  A visiting nurse worked with him on the blue platform, he falls.  He lost proprioception.  He was still in rehab,with his hypersomnia was note by the caretakers. He went on to outpatient PT, now completed.  He has another surgery lined up, fusion in lumbar spine. Myelogram was positive for stenosis , he reported.  He has OSA, diagnosed 18 years ago and is using CPAP since.  He has been told by dr Erling Cruz 10 years ago, that he has essential, familiar tremor.  He has noted delayed work finding , names mostly. He needs to keep a calendar for appointments.    REVIEW OF SYSTEMS: Full 14 system review of systems performed and notable only for:  Constitutional: N/A  Eyes: N/A Ear/Nose/Throat: N/A  Skin: N/A  Cardiovascular: N/A  Respiratory: N/A  Gastrointestinal:  N/A  Genitourinary: N/A Hematology/Lymphatic: N/A  Endocrine: N/A Musculoskeletal:N/A  Allergy/Immunology: N/A  Neurological: N/A Psychiatric: N/A Sleep: N/A   ALLERGIES: Allergies  Allergen Reactions  . Procardia [Nifedipine] Rash    All over body    HOME MEDICATIONS: Outpatient Prescriptions Prior to Visit  Medication Sig Dispense Refill  . acetaminophen (TYLENOL) 325 MG tablet Take 650 mg by mouth every 4 (four) hours as needed for mild pain or moderate pain.      [name] apixaban (ELIQUIS) 5 MG TABS tablet Take 1 tablet (5 mg total) by mouth 2 (two) times daily.  60 tablet  6  . cholecalciferol (VITAMIN D) 1000 UNITS tablet Take 1,000 Units by mouth daily.       [name] FLUoxetine (PROZAC) 10 MG capsule Take 1 capsule (10 mg total) by mouth daily.  30  capsule  3  . furosemide (LASIX) 40 MG tablet TAKE 1 TABLET BY MOUTH DAILY  30 tablet  3  . meclizine (ANTIVERT) 12.5 MG tablet TAKE 1 TABLET BY MOUTH TWICE DAILY AS NEEDED FOR DIZZINESS  60 tablet  0  . metoprolol succinate (TOPROL-XL) 25 MG 24 hr tablet Take 1 tablet (25 mg total) by mouth daily.  90 tablet  1  . Multiple Vitamins-Minerals (MULTIVITAMIN WITH MINERALS) tablet Take 1 tablet by mouth daily.      . potassium chloride SA (K-DUR,KLOR-CON) 20 MEQ tablet TAKE 1 TABLET BY MOUTH EVERY DAY  30 tablet  0  . rosuvastatin (CRESTOR) 40 MG tablet Take 1 tablet (40 mg total) by mouth daily with breakfast. For Hyperlipdemia  90 tablet  1  . sodium chloride (OCEAN) 0.65 % SOLN nasal spray Place 1 spray into both nostrils as needed for congestion.      . Testosterone 20.25 MG/1.25GM (1.62%) GEL Apply 1 packet topically daily. Apply contents of 1 packet=1.25gm topically to each shoulder daily for hormone therapy       No facility-administered medications prior to visit.    PAST MEDICAL HISTORY: Past Medical History  Diagnosis Date  . CAD (coronary artery disease) of bypass graft   . Dyslipidemia     well controlled  . Obesity   . Arthritis   . Male hypogonadism   . Gait disorder   . Hypertension   . Persistent atrial fibrillation 2003  . Sleep apnea     cpap  . CHF (congestive heart failure)   . Dry eyes   . Complication of anesthesia     "woke up too soon"  . Depression   . COPD (chronic obstructive pulmonary disease)     "no signs or tests"  . Headache(784.0)     silent migraines- no pain just go blind for just a minute  . Cancer     skin  . Memory loss, short term 2/15    per daughter since last surgery and placement at [facility].  "Hallucinations have stopped"  . Sleep disorder   . Multiple falls 08/09/2014  . Chronic systolic dysfunction of left ventricle   . Cerebrovascular disease 2006    multiple ischemic changes on prior MRI  . Sleep apnea     on CPAP  . Atrial  flutter   . Hypertensive cardiovascular disease     PAST SURGICAL HISTORY: Past Surgical History  Procedure Laterality Date  . Lumbar cyst      Dr. Joya Salm  . Shoulder surgery      Right with murphy, reattach ligaments  . Coronary artery bypass graft  2003  x 4  . Video assisted thoracoscopy (vats)/thorocotomy  2002    benign nodule  . Joint replacement      right shoulder replaced  . Coronary angioplasty  1991    with stents placed  . Eye surgery Bilateral     cataract surgeries  . Eye surgery Right     detached retina  . Tonsillectomy  as child  . Appendectomy  as child  . Mohs surgery      on chest  . Total knee arthroplasty Right 01/02/2014    Procedure: RIGHT TOTAL KNEE ARTHROPLASTY;  Surgeon: Mauri Pole, MD;  Location: [facility]S;  Service: Orthopedics;  Laterality: Right;  . Total knee arthroplasty Left 01/31/2014    Procedure: LEFT TOTAL KNEE ARTHROPLASTY;  Surgeon: Mauri Pole, MD;  Location: [facility];  Service: Orthopedics;  Laterality: Left;  . Cardioversion N/A 04/26/2014    Procedure: CARDIOVERSION;  Surgeon: Sinclair Grooms, MD;  Location: [facility] ENDOSCOPY;  Service: Cardiovascular;  Laterality: N/A;  . Cardioversion N/A 05/24/2014    Procedure: CARDIOVERSION;  Surgeon: Sinclair Grooms, MD;  Location: [facility] ENDOSCOPY;  Service: Cardiovascular;  Laterality: N/A;    FAMILY HISTORY: Family History  Problem Relation Age of Onset  . Pancreatic cancer Father   . CAD Father   . CAD Mother     SOCIAL HISTORY: History   Social History  . Marital Status: Divorced    Spouse Name: N/A    Number of Children: 2  . Years of Education: Masters   Occupational History  .      retired   Social History Main Topics  . Smoking status: Former Smoker -- 30 years    Types: Cigarettes    Quit date: 06/17/1989  . Smokeless tobacco: Never Used  . Alcohol Use: 0.0 oz/week     Comment: 4-5 oz per day before dinner  . Drug Use: No  . Sexual Activity: Not Currently    Other Topics Concern  . Not on file   Social History Narrative   Patient is single and lives alone.   Patient is retired.   Patient has a Scientist, water quality.   Patient has two adult children.   Patient is right-handed.   Patient drinks one cup of coffee daily.      Pt lives in [place] in his RV alone.   Retired Engineer, civil (consulting)      PHYSICAL EXAM  Filed Vitals:   09/13/14 1017  BP: 99/64  Pulse: 82  Height: 5\' 9"  (1.753 m)  Weight: 239 lb (108.41 kg)   Body mass index is 35.28 kg/(m^2).  Generalized: Well developed, in no acute distress   Neurological examination  Mentation: Alert oriented to time, place, history taking. Follows all commands speech and language fluent Cranial nerve II-XII: Pupils were equal round reactive to light. Extraocular movements were full, visual field were full on confrontational test. Facial sensation and strength were normal. Head turning and shoulder shrug  were normal and symmetric. Motor: The motor testing reveals 5 over 5 strength of all 4 extremities. Good symmetric motor tone is noted throughout.  Sensory: Sensory testing is intact to soft touch on all 4 extremities. No evidence of extinction is noted.  Coordination: Cerebellar testing reveals good finger-nose-finger and heel-to-shin bilaterally.  Gait and station: uses a cane when ambulating. Slightly wide based. Gait is slightly unsteady. Tandem gait not attempted. Romberg is negative. No drift is seen.  Reflexes: Deep tendon reflexes are symmetric and normal  bilaterally.    DIAGNOSTIC DATA (LABS, IMAGING, TESTING) - I reviewed patient records, labs, notes, testing and imaging myself where available.  Lab Results  Component Value Date   WBC 8.6 05/22/2014   HGB 13.3 05/22/2014   HCT 41.3 05/22/2014   MCV 85.5 05/22/2014   PLT 167.0 05/22/2014      Component Value Date/Time   NA 138 05/22/2014 1135   K 4.4 05/22/2014 1135   CL 103 05/22/2014 1135   CO2 26 05/22/2014 1135   GLUCOSE 80  05/22/2014 1135   BUN 23 05/22/2014 1135   CREATININE 1.2 05/22/2014 1135   CREATININE 1.10 05/11/2014 1338   CALCIUM 9.8 05/22/2014 1135   PROT 6.8 10/05/2013 1137   ALBUMIN 4.1 10/05/2013 1137   AST 23 10/05/2013 1137   ALT 17 10/05/2013 1137   ALKPHOS 41 10/05/2013 1137   BILITOT 0.5 10/05/2013 1137   GFRNONAA 65* 02/02/2014 0421   GFRAA 35* 02/02/2014 0421   Lab Results  Component Value Date   CHOL 182 10/05/2013   HDL 53 10/05/2013   LDLCALC 78 10/05/2013   TRIG 253* 10/05/2013   CHOLHDL 3.4 10/05/2013   Lab Results  Component Value Date   HGBA1C 6.1* 08/15/2014   No results found for this basename: [id number]   Lab Results  Component Value Date   TSH 1.120 08/15/2014      ASSESSMENT AND PLAN 78 y.o. year old male  has a past medical history of CAD (coronary artery disease) of bypass graft; Dyslipidemia; Obesity; Arthritis; Male hypogonadism; Gait disorder; Hypertension; Persistent atrial fibrillation (2003); Sleep apnea; CHF (congestive heart failure); Dry eyes; Complication of anesthesia; Depression; COPD (chronic obstructive pulmonary disease); Headache(784.0); Cancer; Memory loss, short term (2/15); Sleep disorder; Multiple falls (08/09/2014); Chronic systolic dysfunction of left ventricle; Cerebrovascular disease (2006); Sleep apnea; Atrial flutter; and Hypertensive cardiovascular disease. here with:  1. Essential tremor 2. Dizziness 3. Frequent falls  Patient has been evaluated by Dr. Rexene Alberts who ruled out parkinson's. Over the years he has had a thorough work-up for dizziness without finding an etiology. He does have balance and gait instability. He has had several falls in the past. I think the patient would benefit from physical therapy for gait and balance training. A CT of the head was ordered at his last visit with Dr. Brett Fairy but he states that he was unable to complete that because insurance would not cover it. I will have our staff look into that. The patient will  follow-up in 6 months or sooner if needed.    Ward Givens, MSN, NP-C 09/13/2014, 10:20 AM [facility] [address] [place],  [address] [phone]  Note: This document was prepared with digital dictation and possible smart phrase technology. Any transcriptional errors that result from this process are unintentional.

## 2014-09-13 NOTE — Patient Instructions (Signed)

## 2014-09-13 NOTE — Progress Notes (Signed)
I agree with the assessment and plan as directed by NP .The patient is known to me .   Arti Trang, MD  

## 2014-09-22 ENCOUNTER — Ambulatory Visit: Payer: Medicare Other | Attending: Family Medicine | Admitting: Physical Therapy

## 2014-09-22 DIAGNOSIS — Z5189 Encounter for other specified aftercare: Secondary | ICD-10-CM | POA: Diagnosis not present

## 2014-09-22 DIAGNOSIS — R2689 Other abnormalities of gait and mobility: Secondary | ICD-10-CM | POA: Diagnosis not present

## 2014-09-22 DIAGNOSIS — M6281 Muscle weakness (generalized): Secondary | ICD-10-CM | POA: Insufficient documentation

## 2014-09-22 DIAGNOSIS — Z9181 History of falling: Secondary | ICD-10-CM | POA: Insufficient documentation

## 2014-09-28 ENCOUNTER — Other Ambulatory Visit: Payer: Self-pay | Admitting: Family Medicine

## 2014-10-03 ENCOUNTER — Ambulatory Visit: Payer: Medicare Other | Admitting: Physical Therapy

## 2014-10-03 ENCOUNTER — Ambulatory Visit (INDEPENDENT_AMBULATORY_CARE_PROVIDER_SITE_OTHER): Payer: Medicare Other | Admitting: Interventional Cardiology

## 2014-10-03 ENCOUNTER — Encounter: Payer: Self-pay | Admitting: Interventional Cardiology

## 2014-10-03 VITALS — BP 122/88 | HR 86 | Ht 69.0 in | Wt 236.0 lb

## 2014-10-03 DIAGNOSIS — Z5189 Encounter for other specified aftercare: Secondary | ICD-10-CM | POA: Diagnosis not present

## 2014-10-03 DIAGNOSIS — I2510 Atherosclerotic heart disease of native coronary artery without angina pectoris: Secondary | ICD-10-CM

## 2014-10-03 DIAGNOSIS — I484 Atypical atrial flutter: Secondary | ICD-10-CM | POA: Diagnosis not present

## 2014-10-03 DIAGNOSIS — Z9181 History of falling: Secondary | ICD-10-CM | POA: Diagnosis not present

## 2014-10-03 DIAGNOSIS — M6281 Muscle weakness (generalized): Secondary | ICD-10-CM | POA: Diagnosis not present

## 2014-10-03 DIAGNOSIS — R2689 Other abnormalities of gait and mobility: Secondary | ICD-10-CM | POA: Diagnosis not present

## 2014-10-03 DIAGNOSIS — I4892 Unspecified atrial flutter: Secondary | ICD-10-CM | POA: Insufficient documentation

## 2014-10-03 NOTE — Progress Notes (Signed)
Patient ID: Aaron Berg, male   DOB: 1936-01-10, 78 y.o.   MRN: [id number]    [address],   [address] Phone: [phone] Fax:  [phone]  Date:  10/03/2014   ID:  Aaron Berg, DOB Jan 12, 1936, MRN [id number]  PCP:  Wyatt Haste, MD   ASSESSMENT:  1. Atypical atrial flutter, with controlled rate 2. Coronary artery disease stable without angina. Prior coronary artery bypass grafting 3. Hypertension, controlled 4. Hyperlipidemia  PLAN:  1. Continue the current medical regimen as listed 2  Followup with Dr. Rayann Heman as previously scheduled 3. Clinical followup with me in 6 months.   SUBJECTIVE: Aaron Berg is a 78 y.o. male who is doing okay. He has occasional shortness of breath. He has not had syncope or sustained palpitations. Is no lower extremity swelling. He denies orthopnea. He has not had angina.   Wt Readings from Last 3 Encounters:  10/03/14 236 lb (107.049 kg)  09/13/14 239 lb (108.41 kg)  08/30/14 236 lb (107.049 kg)     Past Medical History  Diagnosis Date  . CAD (coronary artery disease) of bypass graft   . Dyslipidemia     well controlled  . Obesity   . Arthritis   . Male hypogonadism   . Gait disorder   . Hypertension   . Persistent atrial fibrillation 2003  . Sleep apnea     cpap  . CHF (congestive heart failure)   . Dry eyes   . Complication of anesthesia     "woke up too soon"  . Depression   . COPD (chronic obstructive pulmonary disease)     "no signs or tests"  . Headache(784.0)     silent migraines- no pain just go blind for just a minute  . Cancer     skin  . Memory loss, short term 2/15    per daughter since last surgery and placement at [facility]  "Hallucinations have stopped"  . Sleep disorder   . Multiple falls 08/09/2014  . Chronic systolic dysfunction of left ventricle   . Cerebrovascular disease 2006    multiple ischemic changes on prior MRI  . Sleep apnea     on CPAP  . Atrial  flutter   . Hypertensive cardiovascular disease     Current Outpatient Prescriptions  Medication Sig Dispense Refill  . acetaminophen (TYLENOL) 325 MG tablet Take 650 mg by mouth every 4 (four) hours as needed for mild pain or moderate pain.      [name] apixaban (ELIQUIS) 5 MG TABS tablet Take 1 tablet (5 mg total) by mouth 2 (two) times daily.  60 tablet  6  . cholecalciferol (VITAMIN D) 1000 UNITS tablet Take 1,000 Units by mouth daily.       [name] FLUoxetine (PROZAC) 10 MG capsule TAKE ONE CAPSULE BY MOUTH DAILY  30 capsule  0  . furosemide (LASIX) 40 MG tablet TAKE 1 TABLET BY MOUTH DAILY  30 tablet  3  . meclizine (ANTIVERT) 12.5 MG tablet TAKE 1 TABLET BY MOUTH TWICE DAILY AS NEEDED FOR DIZZINESS  60 tablet  0  . metoprolol succinate (TOPROL-XL) 25 MG 24 hr tablet Take 1 tablet (25 mg total) by mouth daily.  90 tablet  1  . Multiple Vitamins-Minerals (MULTIVITAMIN WITH MINERALS) tablet Take 1 tablet by mouth daily.      . potassium chloride SA (K-DUR,KLOR-CON) 20 MEQ tablet TAKE 1 TABLET BY MOUTH EVERY DAY  30 tablet  0  .  rosuvastatin (CRESTOR) 40 MG tablet Take 1 tablet (40 mg total) by mouth daily with breakfast. For Hyperlipdemia  90 tablet  1  . sodium chloride (OCEAN) 0.65 % SOLN nasal spray Place 1 spray into both nostrils as needed for congestion.      . Testosterone 20.25 MG/1.25GM (1.62%) GEL Apply 1 packet topically daily. Apply contents of 1 packet=1.25gm topically to each shoulder daily for hormone therapy       No current facility-administered medications for this visit.    Allergies:    Allergies  Allergen Reactions  . Procardia [Nifedipine] Rash    All over body    Social History:  The patient  reports that he quit smoking about 25 years ago. His smoking use included Cigarettes. He smoked 0.00 packs per day for 30 years. He has never used smokeless tobacco. He reports that he drinks alcohol. He reports that he does not use illicit drugs.   ROS:  Please see the history of  present illness.   No blood in the urine or stool. No transient neurological complaints.   All other systems reviewed and negative.   OBJECTIVE: VS:  BP 122/88  Pulse 86  Ht 5\' 9"  (1.753 m)  Wt 236 lb (107.049 kg)  BMI 34.84 kg/m2 Well nourished, well developed, in no acute distress, obese HEENT: normal Neck: JVD flat. Carotid bruit absent  Cardiac:  normal S1, S2; RRR; no murmur Lungs:  clear to auscultation bilaterally, no wheezing, rhonchi or rales Abd: soft, nontender, no hepatomegaly Ext: Edema absent. Pulses 2+ Skin: warm and dry Neuro:  CNs 2-12 intact, no focal abnormalities noted  EKG:  Atrial flutter with controlled ventricular rate at 86 beats per minute       Signed, Illene Labrador III, MD 10/03/2014 10:48 AM

## 2014-10-03 NOTE — Patient Instructions (Signed)
Your physician recommends that you continue on your current medications as directed. Please refer to the Current Medication list given to you today.  Follow up with Dr.Allred as planned  Your physician wants you to follow-up in: 6 months with Dr.Smith You will receive a reminder letter in the mail two months in advance. If you don't receive a letter, please call our office to schedule the follow-up appointment.

## 2014-10-06 ENCOUNTER — Ambulatory Visit: Payer: Medicare Other | Admitting: Rehabilitative and Restorative Service Providers"

## 2014-10-06 DIAGNOSIS — M6281 Muscle weakness (generalized): Secondary | ICD-10-CM | POA: Diagnosis not present

## 2014-10-06 DIAGNOSIS — Z5189 Encounter for other specified aftercare: Secondary | ICD-10-CM | POA: Diagnosis not present

## 2014-10-06 DIAGNOSIS — Z9181 History of falling: Secondary | ICD-10-CM | POA: Diagnosis not present

## 2014-10-06 DIAGNOSIS — R2689 Other abnormalities of gait and mobility: Secondary | ICD-10-CM | POA: Diagnosis not present

## 2014-10-09 ENCOUNTER — Ambulatory Visit: Payer: Medicare Other

## 2014-10-09 DIAGNOSIS — M6281 Muscle weakness (generalized): Secondary | ICD-10-CM | POA: Diagnosis not present

## 2014-10-09 DIAGNOSIS — Z9181 History of falling: Secondary | ICD-10-CM | POA: Diagnosis not present

## 2014-10-09 DIAGNOSIS — R2689 Other abnormalities of gait and mobility: Secondary | ICD-10-CM | POA: Diagnosis not present

## 2014-10-09 DIAGNOSIS — Z5189 Encounter for other specified aftercare: Secondary | ICD-10-CM | POA: Diagnosis not present

## 2014-10-11 ENCOUNTER — Ambulatory Visit: Payer: Medicare Other

## 2014-10-11 DIAGNOSIS — M6281 Muscle weakness (generalized): Secondary | ICD-10-CM | POA: Diagnosis not present

## 2014-10-11 DIAGNOSIS — R2689 Other abnormalities of gait and mobility: Secondary | ICD-10-CM | POA: Diagnosis not present

## 2014-10-11 DIAGNOSIS — Z5189 Encounter for other specified aftercare: Secondary | ICD-10-CM | POA: Diagnosis not present

## 2014-10-11 DIAGNOSIS — Z9181 History of falling: Secondary | ICD-10-CM | POA: Diagnosis not present

## 2014-10-18 ENCOUNTER — Ambulatory Visit: Payer: Medicare Other | Attending: Neurology

## 2014-10-18 DIAGNOSIS — M6281 Muscle weakness (generalized): Secondary | ICD-10-CM

## 2014-10-18 DIAGNOSIS — Z5189 Encounter for other specified aftercare: Secondary | ICD-10-CM | POA: Insufficient documentation

## 2014-10-18 DIAGNOSIS — Z9181 History of falling: Secondary | ICD-10-CM | POA: Diagnosis not present

## 2014-10-18 DIAGNOSIS — R269 Unspecified abnormalities of gait and mobility: Secondary | ICD-10-CM

## 2014-10-18 DIAGNOSIS — R2689 Other abnormalities of gait and mobility: Secondary | ICD-10-CM | POA: Insufficient documentation

## 2014-10-18 NOTE — Therapy (Signed)
Physical Therapy Treatment  Patient Details  Name: Aaron Berg MRN: [id number] Date of Birth: 1936/06/12  Encounter Date: 10/18/2014      PT End of Session - 10/18/14 1545    Visit Number 6   Number of Visits 16   PT Start Time 4818   PT Stop Time 1530   PT Time Calculation (min) 44 min   Equipment Utilized During Treatment Gait belt   Activity Tolerance Patient tolerated treatment well      Past Medical History  Diagnosis Date  . CAD (coronary artery disease) of bypass graft   . Dyslipidemia     well controlled  . Obesity   . Arthritis   . Male hypogonadism   . Gait disorder   . Hypertension   . Persistent atrial fibrillation 2003  . Sleep apnea     cpap  . CHF (congestive heart failure)   . Dry eyes   . Complication of anesthesia     "woke up too soon"  . Depression   . COPD (chronic obstructive pulmonary disease)     "no signs or tests"  . Headache(784.0)     silent migraines- no pain just go blind for just a minute  . Cancer     skin  . Memory loss, short term 2/15    per daughter since last surgery and placement at [facility].  "Hallucinations have stopped"  . Sleep disorder   . Multiple falls 08/09/2014  . Chronic systolic dysfunction of left ventricle   . Cerebrovascular disease 2006    multiple ischemic changes on prior MRI  . Sleep apnea     on CPAP  . Atrial flutter   . Hypertensive cardiovascular disease     Past Surgical History  Procedure Laterality Date  . Lumbar cyst      Dr. Joya Salm  . Shoulder surgery      Right with murphy, reattach ligaments  . Coronary artery bypass graft  2003    x 4  . Video assisted thoracoscopy (vats)/thorocotomy  2002    benign nodule  . Joint replacement      right shoulder replaced  . Coronary angioplasty  1991    with stents placed  . Eye surgery Bilateral     cataract surgeries  . Eye surgery Right     detached retina  . Tonsillectomy  as child  . Appendectomy  as child  . Mohs surgery      on  chest  . Total knee arthroplasty Right 01/02/2014    Procedure: RIGHT TOTAL KNEE ARTHROPLASTY;  Surgeon: Mauri Pole, MD;  Location: [facility];  Service: Orthopedics;  Laterality: Right;  . Total knee arthroplasty Left 01/31/2014    Procedure: LEFT TOTAL KNEE ARTHROPLASTY;  Surgeon: Mauri Pole, MD;  Location: [facility];  Service: Orthopedics;  Laterality: Left;  . Cardioversion N/A 04/26/2014    Procedure: CARDIOVERSION;  Surgeon: Sinclair Grooms, MD;  Location: [facility] ENDOSCOPY;  Service: Cardiovascular;  Laterality: N/A;  . Cardioversion N/A 05/24/2014    Procedure: CARDIOVERSION;  Surgeon: Sinclair Grooms, MD;  Location: [facility] ENDOSCOPY;  Service: Cardiovascular;  Laterality: N/A;    There were no vitals taken for this visit.  Visit Diagnosis:  Abnormality of gait  Muscle weakness (generalized)          Adult PT Treatment/Exercise - 10/18/14 0700    Ambulation/Gait   Ambulation/Gait Yes   Ambulation/Gait Assistance 5: Supervision   Ambulation/Gait Assistance Details VC's to  improve R heel strike during last 150' of ambulation.   Ambulation Distance (Feet) 400 Feet   Assistive device Straight cane   Gait Pattern Decreased stride length;Step-through pattern;Lateral trunk lean to right  Decreased R heel strike.   Gait velocity 2.4f/sec.  11.46sec.   Stairs No   Standardized Balance Assessment   Standardized Balance Assessment Berg Balance Test;Timed Up and Go Test   Berg Balance Test   Sit to Stand Able to stand without using hands and stabilize independently   Standing Unsupported Able to stand safely 2 minutes   Sitting with Back Unsupported but Feet Supported on Floor or Stool Able to sit safely and securely 2 minutes   Stand to Sit Sits safely with minimal use of hands   Transfers Able to transfer safely, definite need of hands   Standing Unsupported with Eyes Closed Able to stand 10 seconds with supervision   Standing Ubsupported with Feet Together Able to place feet  together independently and stand 1 minute safely   From Standing, Reach Forward with Outstretched Arm Can reach confidently >25 cm (10")   From Standing Position, Pick up Object from Floor Able to pick up shoe safely and easily   From Standing Position, Turn to Look Behind Over each Shoulder Looks behind from both sides and weight shifts well   Turn 360 Degrees Able to turn 360 degrees safely but slowly   Standing Unsupported, Alternately Place Feet on Step/Stool Able to complete 4 steps without aid or supervision   Standing Unsupported, One Foot in Front Able to plae foot ahead of the other independently and hold 30 seconds   Standing on One Leg Tries to lift leg/unable to hold 3 seconds but remains standing independently   Total Score 46   Timed Up and Go Test   TUG Normal TUG  with SPC   Normal TUG (seconds) 15.22   Exercises   Exercises Balance;Knee/Hip;Ankle  [facility]            PT Short Term Goals - 10/18/14 1549    PT SHORT TERM GOAL #1   Title Pt will be able to be independent with HEP.  10/20/14   Time 0   Period Days   Status Achieved   PT SHORT TERM GOAL #2   Title Pt will be able to improve BERG balance score to >/=38/56 to improve balance.   Time 0   Period Days   Status Achieved   PT SHORT TERM GOAL #3   Title Pt will be able to improve timed up and go to < 18 sec for improved balance.   Time 0   Period Days   Status Achieved   PT SHORT TERM GOAL #4   Title Pt will be able to improve gait velocity to > 2.5 ft/sec for improved mobility.   Time 0   Period Days   Status Achieved   PT SHORT TERM GOAL #5   Title Pt will be able to ambulate > 300' with LRAD modified independent for improved mobility.   Time 0   Period Days   Status Not Met          PT Long Term Goals - 10/18/14 1553    PT LONG TERM GOAL #1   Title Pt will be able to verbalize understanding of fall prevention strategies within home environment.  11/17/14   Time 4   Period  Weeks   Status On-going   PT LONG TERM GOAL #2  Title Pt will be able to improve BERG balance score to >/= 44/56 for improved balance.   Time 0   Period Weeks   Status Achieved   PT LONG TERM GOAL #3   Title Pt will be able to improve timed up and go to < 15 sec for improved balance.   Time 4   Period Weeks   Status On-going   PT LONG TERM GOAL #4   Title Pt will be able to improve gait velocity to > 2.62 ft/sec for improved mobility.   Time 0   Period Days   Status Achieved   PT LONG TERM GOAL #5   Title Pt will be able to ambulate > 500' with LRAD modified independent for improved mobility on indoor/paved outdoor surfaces.   Time 4   Period Weeks   Status On-going   Additional Long Term Goals   Additional Long Term Goals Yes   PT LONG TERM GOAL #6   Title Pt will be able to improve BERG balance score to >/=50/56 for improve balance.   Time 4   Period Weeks   Status New   PT LONG TERM GOAL #7   Title Pt will be able to improve gait velocity to >2.57f/sec. for improve functional mobililty.   Time 4   Period Weeks   Status New          Plan - 10/18/14 1546    Clinical Impression Statement Pt demonstrated progress, as he met 4/5 STGs and 2/5 LTGs. Pt continues to be at an increased risk for falls as indicated by 46/56 BERG score. Pt continues to be limited by decreased endurance, as he required frequent seated rest breaks. Pt would continue to benefit from skilled therapy to improve safety during functional mobility.   Pt will benefit from skilled therapeutic intervention in order to improve on the following deficits Abnormal gait;Decreased activity tolerance;Decreased balance;Decreased mobility;Decreased strength;Pain   Rehab Potential Good   PT Frequency Min 2X/week   PT Duration 8 weeks   PT Treatment/Interventions DME instruction;Gait training;Balance training;Modalities;Neuromuscular re-education;Stair training;Patient/family education;Functional mobility  training;Therapeutic exercise;Manual techniques;Therapeutic activities   PT Plan Continue to progress dynamic gait and balance activities, amb. outdoors over uneven terrain and improve endurance. Step-ups.        Problem List Patient Active Problem List   Diagnosis Date Noted  . Atrial flutter 10/03/2014  . Multiple falls 08/09/2014  . Chronic anticoagulation 06/30/2014  . Atrial fibrillation 03/03/2014  . Protein-calorie malnutrition, severe 02/03/2014  . Unspecified arthropathy, lower leg 01/29/2014  . Chronic airway obstruction, not elsewhere classified 01/29/2014  . Constipation 01/13/2014  . Expected blood loss anemia 01/04/2014  . Obesity (BMI 30-39.9) 01/04/2014  . S/P left TKA 01/02/2014  . Chronic combined systolic and diastolic CHF (congestive heart failure) 10/11/2013  . Essential hypertension, benign 10/11/2013  . ASHD (arteriosclerotic heart disease) 07/09/2011  . Hyperlipidemia LDL goal < 70 07/09/2011  . Sleep apnea 07/09/2011  . Alcohol abuse, daily use 07/09/2011  . Hypogonadism male 07/09/2011                                            , L 10/18/2014, 3:58 PM

## 2014-10-20 ENCOUNTER — Ambulatory Visit: Payer: Medicare Other

## 2014-10-20 VITALS — BP 118/71

## 2014-10-20 DIAGNOSIS — Z5189 Encounter for other specified aftercare: Secondary | ICD-10-CM | POA: Diagnosis not present

## 2014-10-20 DIAGNOSIS — M6281 Muscle weakness (generalized): Secondary | ICD-10-CM

## 2014-10-20 DIAGNOSIS — R2689 Other abnormalities of gait and mobility: Secondary | ICD-10-CM | POA: Diagnosis not present

## 2014-10-20 DIAGNOSIS — Z9181 History of falling: Secondary | ICD-10-CM | POA: Diagnosis not present

## 2014-10-20 DIAGNOSIS — R269 Unspecified abnormalities of gait and mobility: Secondary | ICD-10-CM

## 2014-10-20 NOTE — Therapy (Signed)
Physical Therapy Treatment  Patient Details  Name: Aaron Berg MRN: [id number] Date of Birth: [birth year]  Encounter Date: 10/20/2014      PT End of Session - 10/20/14 1556    Visit Number 7   Number of Visits 16   PT Start Time 1455   PT Stop Time 1540   PT Time Calculation (min) 45 min   Equipment Utilized During Treatment Gait belt   Activity Tolerance Patient limited by fatigue      Past Medical History  Diagnosis Date  . CAD (coronary artery disease) of bypass graft   . Dyslipidemia     well controlled  . Obesity   . Arthritis   . Male hypogonadism   . Gait disorder   . Hypertension   . Persistent atrial fibrillation 2003  . Sleep apnea     cpap  . CHF (congestive heart failure)   . Dry eyes   . Complication of anesthesia     "woke up too soon"  . Depression   . COPD (chronic obstructive pulmonary disease)     "no signs or tests"  . Headache(784.0)     silent migraines- no pain just go blind for just a minute  . Cancer     skin  . Memory loss, short term 2/15    per daughter since last surgery and placement at [facility].  "Hallucinations have stopped"  . Sleep disorder   . Multiple falls 08/09/2014  . Chronic systolic dysfunction of left ventricle   . Cerebrovascular disease 2006    multiple ischemic changes on prior MRI  . Sleep apnea     on CPAP  . Atrial flutter   . Hypertensive cardiovascular disease     Past Surgical History  Procedure Laterality Date  . Lumbar cyst      Dr. Joya Salm  . Shoulder surgery      Right with murphy, reattach ligaments  . Coronary artery bypass graft  2003    x 4  . Video assisted thoracoscopy (vats)/thorocotomy  2002    benign nodule  . Joint replacement      right shoulder replaced  . Coronary angioplasty  1991    with stents placed  . Eye surgery Bilateral     cataract surgeries  . Eye surgery Right     detached retina  . Tonsillectomy  as child  . Appendectomy  as child  . Mohs surgery      on chest  .  Total knee arthroplasty Right 01/02/2014    Procedure: RIGHT TOTAL KNEE ARTHROPLASTY;  Surgeon: Mauri Pole, MD;  Location: [facility];  Service: Orthopedics;  Laterality: Right;  . Total knee arthroplasty Left 01/31/2014    Procedure: LEFT TOTAL KNEE ARTHROPLASTY;  Surgeon: Mauri Pole, MD;  Location: [facility];  Service: Orthopedics;  Laterality: Left;  . Cardioversion N/A 04/26/2014    Procedure: CARDIOVERSION;  Surgeon: Sinclair Grooms, MD;  Location: [facility];  Service: Cardiovascular;  Laterality: N/A;  . Cardioversion N/A 05/24/2014    Procedure: CARDIOVERSION;  Surgeon: Sinclair Grooms, MD;  Location: [facility] ENDOSCOPY;  Service: Cardiovascular;  Laterality: N/A;    BP 118/71 mmHg  Visit Diagnosis:  Abnormality of gait  Muscle weakness (generalized)          [facility] Adult PT Treatment/Exercise - 10/20/14 1500    Ambulation/Gait   Ambulation/Gait Yes   Ambulation/Gait Assistance 4: Min guard;4: Min assist  60'    Ambulation/Gait Assistance Details  Pt required frequent seated rest breaks (2-5 minutes) due to fatigue and B LE pain. VC's to improve R heel strike, over sidewalk/even terrain. Min A to maintain balance, while ambulating in grass.  Assessed pt's BP at end of session, due to c/o dizziness, negative for orthostatic hypotension, see vitals for details.   3 minute walk test: 338'   Ambulation Distance (Feet) --  190', 200', 338, 75'   Assistive device Straight cane   Gait Pattern Decreased stride length;Step-through pattern;Lateral trunk lean to right  Decreased heel strike (R>L).              PT Long Term Goals - 10/20/14 1603    PT LONG TERM GOAL #1   Title Pt will be able to verbalize understanding of fall prevention strategies within home environment.   Time --  11/17/14   Status On-going   PT LONG TERM GOAL #2   Title Pt will be able to improve BERG balance score to >/= 44/56 for improved balance.   Time 0   Period Weeks   Status Achieved   PT LONG  TERM GOAL #3   Title Pt will be able to improve timed up and go to < 15 sec for improved balance.   Time 4   Period Weeks   Status On-going   PT LONG TERM GOAL #4   Title Pt will be able to improve gait velocity to > 2.62 ft/sec for improved mobility.   Time 0   Period Days   Status Achieved   PT LONG TERM GOAL #5   Title Pt will be able to ambulate > 500' with LRAD modified independent for improved mobility on indoor/paved outdoor surfaces.   Time 4   Period Weeks   Status On-going   Additional Long Term Goals   Additional Long Term Goals Yes   PT LONG TERM GOAL #6   Title Pt will be able to improve BERG balance score to >/=50/56 for improve balance.   Time 4   Period Weeks   Status On-going   PT LONG TERM GOAL #7   Title Pt will be able to improve gait velocity to >2.14ft/sec. for improve functional mobililty.   Time 4   Period Weeks   Status On-going          Plan - 10/20/14 1557    Clinical Impression Statement Pt continues to be limited by decreased endurance, as he required frequent seated rest breaks. Pt experienced one LOB while ambulating in grass and required min A to maintain balance. Pt would continue to benefit from skilled therapy to improve functional mobliity.   Pt will benefit from skilled therapeutic intervention in order to improve on the following deficits Abnormal gait;Decreased activity tolerance;Decreased balance;Decreased mobility;Decreased strength;Pain   Rehab Potential Good   PT Frequency Min 2X/week   PT Duration 8 weeks   PT Treatment/Interventions DME instruction;Gait training;Balance training;Modalities;Neuromuscular re-education;Stair training;Patient/family education;Functional mobility training;Therapeutic exercise;Manual techniques;Therapeutic activities   PT Plan Continue to progress dynamic gait and balance activities, amb. outdoors over uneven terrain and improve endurance. Step-ups.        Problem List Patient Active Problem List    Diagnosis Date Noted  . Atrial flutter 10/03/2014  . Multiple falls 08/09/2014  . Chronic anticoagulation 06/30/2014  . Atrial fibrillation 03/03/2014  . Protein-calorie malnutrition, severe 02/03/2014  . Unspecified arthropathy, lower leg 01/29/2014  . Chronic airway obstruction, not elsewhere classified 01/29/2014  . Constipation 01/13/2014  . Expected blood loss anemia 01/04/2014  .  Obesity (BMI 30-39.9) 01/04/2014  . S/P left TKA 01/02/2014  . Chronic combined systolic and diastolic CHF (congestive heart failure) 10/11/2013  . Essential hypertension, benign 10/11/2013  . ASHD (arteriosclerotic heart disease) 07/09/2011  . Hyperlipidemia LDL goal < 70 07/09/2011  . Sleep apnea 07/09/2011  . Alcohol abuse, daily use 07/09/2011  . Hypogonadism male 07/09/2011                                            Keshayla Schrum L 10/20/2014, 4:05 PM

## 2014-10-23 ENCOUNTER — Telehealth: Payer: Self-pay

## 2014-10-23 ENCOUNTER — Ambulatory Visit: Payer: Medicare Other

## 2014-10-23 NOTE — Telephone Encounter (Signed)
Patient called and spoke with front desk staff to cancel 10/23/14 at 1400, due to leg weakness and fall. PT called pt back and pt reported his legs feel weak today, and that he had a slow/controlled fall outside, due to the weakness and denied hitting head. Pt reported he "over did it" this weekend and feels that legs are tired from the weekend. PT encouraged pt to notify PCP if he feels dizziness, headache, nausea, or general malaise/fatigue. Pt verbalized understanding and agreement.

## 2014-10-25 ENCOUNTER — Ambulatory Visit: Payer: Medicare Other

## 2014-11-01 ENCOUNTER — Other Ambulatory Visit: Payer: Self-pay | Admitting: Family Medicine

## 2014-11-01 NOTE — Telephone Encounter (Signed)
Is this okay?

## 2014-11-13 ENCOUNTER — Other Ambulatory Visit: Payer: Self-pay | Admitting: Physician Assistant

## 2014-12-18 ENCOUNTER — Ambulatory Visit (INDEPENDENT_AMBULATORY_CARE_PROVIDER_SITE_OTHER): Payer: Medicare Other | Admitting: Internal Medicine

## 2014-12-18 ENCOUNTER — Encounter: Payer: Self-pay | Admitting: Internal Medicine

## 2014-12-18 VITALS — BP 126/68 | HR 78 | Ht 70.0 in | Wt 232.2 lb

## 2014-12-18 DIAGNOSIS — E669 Obesity, unspecified: Secondary | ICD-10-CM

## 2014-12-18 DIAGNOSIS — I4891 Unspecified atrial fibrillation: Secondary | ICD-10-CM | POA: Diagnosis not present

## 2014-12-18 DIAGNOSIS — R296 Repeated falls: Secondary | ICD-10-CM | POA: Diagnosis not present

## 2014-12-18 NOTE — Patient Instructions (Signed)
Your physician has recommended you make the following change in your medication:  1) STOP amiodarone  We will try to move your follow up appointment with neurology up from March- due to increased balance issues/ falls/ memory loss/ sleep walking  Your physician wants you to follow-up in: 3 months with Dr. Tamala Julian. You will receive a reminder letter in the mail two months in advance. If you don't receive a letter, please call our office to schedule the follow-up appointment.  Your physician wants you to follow-up in: 6 months with Roderic Palau- NP. You will receive a reminder letter in the mail two months in advance. If you don't receive a letter, please call our office to schedule the follow-up appointment.

## 2014-12-18 NOTE — Progress Notes (Deleted)
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 male with a h/o persistent atrial fibrillation who presents for EP consultation.  He reports initially being diagnosed with atrial fibrillation in 2003 after bypass surgery.  He did very well until 4/15 when he had recurrent afib following knee surgery.  He has been in persistent atrial fibrillation since that time.  He is appropriately anticoagulated with xarelto.  He has also been placed on amiodarone.  He was recently cardioverted but quickly returned to afib.    On last visit here, amiodarone was d/cdue to persistent afib/flutter, but by pt's report he continued to take it. Pharmacy was called and confirmed he picjed up inOctober and December. Interesting , he is in NSR today. Wears cpap but not consistently.  He also reports that he is having increasing issues with memory lapses, "I lose a day at times." He has had balance issues and walks with a cane and falls at times, believes this is getting worse. He also thinks he is sleep walking and woke up in bed last week with scrapes on his forehead and nose, and doesn't remember the fall but did notice there were some things knocked off his coffee table. Is a pt at [facility], last seen there in 9/15 and next appointment pending in march. His family recently voiced concern re his living by himself  and  the above issues  and are encouraging him to go to assisted care, pt has been resistant mostly due to having a cat, but is thinking about it more.  Today, he denies symptoms of palpitations, chest pain, shortness of breath, orthopnea, PND,  dizziness, presyncope, syncope, or neurologic sequela.   The patient is tolerating medications without difficulties and is otherwise without complaint other than the above.   Past Medical History  Diagnosis Date  . CAD (coronary artery disease) of bypass graft   . Dyslipidemia    well controlled  . Obesity   . Arthritis   . Male hypogonadism   . Gait disorder   . Hypertension   . Persistent atrial fibrillation 2003  . Sleep apnea     cpap  . CHF (congestive heart failure)   . Dry eyes   . Complication of anesthesia     "woke up too soon"  . Depression   . COPD (chronic obstructive pulmonary disease)     "no signs or tests"  . Headache(784.0)     silent migraines- no pain just go blind for just a minute  . Cancer     skin  . Memory loss, short term 2/15    per daughter since last surgery and placement at [facility].  "Hallucinations have stopped"  . Sleep disorder   . Multiple falls 08/09/2014  . Chronic systolic dysfunction of left ventricle   . Cerebrovascular disease 2006    multiple ischemic changes on prior MRI  . Sleep apnea     on CPAP  . Atrial flutter   . Hypertensive cardiovascular disease    Past Surgical History  Procedure Laterality Date  . Lumbar cyst      Dr. Joya Salm  . Shoulder surgery      Right with murphy, reattach ligaments  . Coronary artery bypass graft  2003    x 4  . Video assisted thoracoscopy (vats)/thorocotomy  2002    benign nodule  . Joint replacement      right shoulder replaced  .  Coronary angioplasty  1991    with stents placed  . Eye surgery Bilateral     cataract surgeries  . Eye surgery Right     detached retina  . Tonsillectomy  as child  . Appendectomy  as child  . Mohs surgery      on chest  . Total knee arthroplasty Right 01/02/2014    Procedure: RIGHT TOTAL KNEE ARTHROPLASTY;  Surgeon: Mauri Pole, MD;  Location: [facility];  Service: Orthopedics;  Laterality: Right;  . Total knee arthroplasty Left 01/31/2014    Procedure: LEFT TOTAL KNEE ARTHROPLASTY;  Surgeon: Mauri Pole, MD;  Location: [facility];  Service: Orthopedics;  Laterality: Left;  . Cardioversion N/A 04/26/2014    Procedure: CARDIOVERSION;  Surgeon: Sinclair Grooms, MD;  Location: [place];  Service: Cardiovascular;  Laterality: N/A;  .  Cardioversion N/A 05/24/2014    Procedure: CARDIOVERSION;  Surgeon: Sinclair Grooms, MD;  Location: [facility] ENDOSCOPY;  Service: Cardiovascular;  Laterality: N/A;    Current Outpatient Prescriptions  Medication Sig Dispense Refill  . acetaminophen (TYLENOL) 325 MG tablet Take 650 mg by mouth every 4 (four) hours as needed for mild pain or moderate pain.    . cholecalciferol (VITAMIN D) 1000 UNITS tablet Take 1,000 Units by mouth daily.     [name] ELIQUIS 5 MG TABS tablet TAKE 1 TABLET BY MOUTH TWICE DAILY 60 tablet 5  . FLUoxetine (PROZAC) 10 MG capsule TAKE ONE CAPSULE BY MOUTH DAILY 30 capsule 1  . meclizine (ANTIVERT) 12.5 MG tablet TAKE 1 TABLET BY MOUTH TWICE DAILY AS NEEDED FOR DIZZINESS 60 tablet 0  . metoprolol succinate (TOPROL-XL) 25 MG 24 hr tablet Take 1 tablet (25 mg total) by mouth daily. 90 tablet 1  . Multiple Vitamins-Minerals (MULTIVITAMIN WITH MINERALS) tablet Take 1 tablet by mouth daily.    . potassium chloride SA (K-DUR,KLOR-CON) 20 MEQ tablet TAKE 1 TABLET BY MOUTH EVERY DAY 30 tablet 0  . rosuvastatin (CRESTOR) 40 MG tablet Take 1 tablet (40 mg total) by mouth daily with breakfast. For Hyperlipdemia 90 tablet 1  . sodium chloride (OCEAN) 0.65 % SOLN nasal spray Place 1 spray into both nostrils daily as needed for congestion.     . Testosterone 20.25 MG/1.25GM (1.62%) GEL Apply 1 application topically daily. For each shoulder     No current facility-administered medications for this visit.    Allergies  Allergen Reactions  . Procardia [Nifedipine] Rash    All over body    History   Social History  . Marital Status: Divorced    Spouse Name: N/A    Number of Children: 2  . Years of Education: Masters   Occupational History  .      retired   Social History Main Topics  . Smoking status: Former Smoker -- 30 years    Types: Cigarettes    Quit date: 06/17/1989  . Smokeless tobacco: Never Used  . Alcohol Use: 0.0 oz/week     Comment: 4-5 oz per day before dinner    . Drug Use: No  . Sexual Activity: Not Currently   Other Topics Concern  . Not on file   Social History Narrative   Patient is single and lives alone.   Patient is retired.   Patient has a Scientist, water quality.   Patient has two adult children.   Patient is right-handed.   Patient drinks one cup of coffee daily.      Pt lives in [place] in  his RV alone.   Retired Engineer, civil (consulting)    Family History  Problem Relation Age of Onset  . Pancreatic cancer Father   . CAD Father   . CAD Mother     ROS- All systems are reviewed and negative except as per the HPI above  Physical Exam: Filed Vitals:   12/18/14 1225  BP: 126/68  Pulse: 78  Height: 5\' 10"  (1.778 m)  Weight: 232 lb 3.2 oz (105.325 kg)    GEN- The patient is well appearing, alert and oriented x 3 today.   Head- normocephalic, atraumatic Eyes-  Sclera clear, conjunctiva pink Ears- hearing intact Oropharynx- clear Neck- supple,  Lungs- Clear to ausculation bilaterally, normal work of breathing Heart- regular rate and rhythm with 2/6 systolic murmur consistent with aortic stenosis. GI- soft, NT, ND, + BS Extremities- no clubbing, cyanosis, or edema MS- no significant deformity or atrophy Skin- no rash or lesion Psych- euthymic mood, full affect Neuro- strength and sensation are intact  EKG today reveals SR, 78 bpm, cannot r/o inf infarct, age undetermined, seen on previous EKG's. PVCs Epic records including Dr. Fabian November, [facility] Neurological Notes reviewed.  Echo 04/20/14- EF 45-50%, LA size 48 mm, mild aortic stenosis.  Assessment and Plan:  1. Persistent atrial fibrillation and atrial flutter Pt was instructed on last visit to stop amiodarone and has taken it inconsistently over the last few months, in SR today, but pt stating he is unaware when he is in or out of rhythm. Stop amiodarone due to increasing concerns with pt balance and falls, to see if any improvement with d/c of drug. Continue eliquis for  now, but is concerning with falls.   2. OSA Compliance with CPAP is encouraged  3. CAD No ischemic symptoms No changes today  4. Chronic systolic and diastolic dysfunction EF is mildly depressed No changes today  5. Hypertensive cardiovascular disease with CHF Stable No change required today  6. Obesity Body mass index is 33.32 kg/(m^2). Weight loss is advised  7. Balance issues, memory lapses, sleep walking ,falls.Pt is concerned and thinks symptoms are progressive.Move up next neuro recheck from March to further evaluate symptoms. Pt was encouraged to discuss further with family re assisted living.  8. Recheck with Dr. Tamala Julian in 3 months, afib clinic in 6 months. Follow-up with Dr Tamala Julian as scheduled

## 2014-12-19 ENCOUNTER — Telehealth: Payer: Self-pay | Admitting: Adult Health

## 2014-12-19 NOTE — Progress Notes (Signed)
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  He reports initially being diagnosed with atrial fibrillation in 2003 after bypass surgery.  He did very well until 4/15 when he had recurrent afib following knee surgery.  He has been in persistent atrial fibrillation since that time.  He is appropriately anticoagulated with xarelto.  He has also been placed on amiodarone.  He was recently cardioverted but quickly returned to afib.    On last visit here, amiodarone was d/cdue to persistent afib/flutter, but by pt's report he continued to take it. Pharmacy was called and confirmed he picked up inOctober and December. Interesting , he is in NSR today. Wears cpap but not consistently.  He also reports that he is having increasing issues with memory lapses, "I lose a day at times." He has had balance issues and walks with a cane and falls at times, believes this is getting worse. He also thinks he is sleep walking and woke up in bed last week with scrapes on his forehead and nose, and doesn't remember the fall but did notice there were some things knocked off his coffee table. Is a pt at [facility], last seen there in 9/15 and next appointment pending in march. His family recently voiced concern re his living by himself  and  the above issues  and are encouraging him to go to assisted care, pt has been resistant mostly due to having a cat, but is thinking about it more.  Today, he denies symptoms of palpitations, chest pain, shortness of breath, orthopnea, PND,  dizziness, presyncope, syncope, or neurologic sequela.   The patient is tolerating medications without difficulties and is otherwise without complaint other than the above.   Past Medical History  Diagnosis Date  . CAD (coronary artery disease) of bypass graft   . Dyslipidemia    well controlled  . Obesity   . Arthritis   . Male hypogonadism   . Gait disorder   . Hypertension   . Persistent atrial fibrillation 2003  . Sleep apnea     cpap  . CHF (congestive heart failure)   . Dry eyes   . Complication of anesthesia     "woke up too soon"  . Depression   . COPD (chronic obstructive pulmonary disease)     "no signs or tests"  . Headache(784.0)     silent migraines- no pain just go blind for just a minute  . Cancer     skin  . Memory loss, short term 2/15    per daughter since last surgery and placement at [facility].  "Hallucinations have stopped"  . Sleep disorder   . Multiple falls 08/09/2014  . Chronic systolic dysfunction of left ventricle   . Cerebrovascular disease 2006    multiple ischemic changes on prior MRI  . Sleep apnea     on CPAP  . Atrial flutter   . Hypertensive cardiovascular disease    Past Surgical History  Procedure Laterality Date  . Lumbar cyst      Dr. Joya Salm  . Shoulder surgery      Right with murphy, reattach ligaments  . Coronary artery bypass graft  2003    x 4  . Video assisted thoracoscopy (vats)/thorocotomy  2002    benign nodule  . Joint replacement      right shoulder  replaced  . Coronary angioplasty  1991    with stents placed  . Eye surgery Bilateral     cataract surgeries  . Eye surgery Right     detached retina  . Tonsillectomy  as child  . Appendectomy  as child  . Mohs surgery      on chest  . Total knee arthroplasty Right 01/02/2014    Procedure: RIGHT TOTAL KNEE ARTHROPLASTY;  Surgeon: Mauri Pole, MD;  [facility];  Service: Orthopedics;  Laterality: Right;  . Total knee arthroplasty Left 01/31/2014    Procedure: LEFT TOTAL KNEE ARTHROPLASTY;  Surgeon: Mauri Pole, MD;  Location: [facility];  Service: Orthopedics;  Laterality: Left;  . Cardioversion N/A 04/26/2014    Procedure: CARDIOVERSION;  Surgeon: Sinclair Grooms, MD;  Location: [place];  Service: Cardiovascular;  Laterality: N/A;  .  Cardioversion N/A 05/24/2014    Procedure: CARDIOVERSION;  Surgeon: Sinclair Grooms, MD;  Location: [facility] ENDOSCOPY;  Service: Cardiovascular;  Laterality: N/A;    Current Outpatient Prescriptions  Medication Sig Dispense Refill  . acetaminophen (TYLENOL) 325 MG tablet Take 650 mg by mouth every 4 (four) hours as needed for mild pain or moderate pain.    . cholecalciferol (VITAMIN D) 1000 UNITS tablet Take 1,000 Units by mouth daily.     [name] ELIQUIS 5 MG TABS tablet TAKE 1 TABLET BY MOUTH TWICE DAILY 60 tablet 5  . FLUoxetine (PROZAC) 10 MG capsule TAKE ONE CAPSULE BY MOUTH DAILY 30 capsule 1  . meclizine (ANTIVERT) 12.5 MG tablet TAKE 1 TABLET BY MOUTH TWICE DAILY AS NEEDED FOR DIZZINESS 60 tablet 0  . metoprolol succinate (TOPROL-XL) 25 MG 24 hr tablet Take 1 tablet (25 mg total) by mouth daily. 90 tablet 1  . Multiple Vitamins-Minerals (MULTIVITAMIN WITH MINERALS) tablet Take 1 tablet by mouth daily.    . potassium chloride SA (K-DUR,KLOR-CON) 20 MEQ tablet TAKE 1 TABLET BY MOUTH EVERY DAY 30 tablet 0  . rosuvastatin (CRESTOR) 40 MG tablet Take 1 tablet (40 mg total) by mouth daily with breakfast. For Hyperlipdemia 90 tablet 1  . sodium chloride (OCEAN) 0.65 % SOLN nasal spray Place 1 spray into both nostrils daily as needed for congestion.     . Testosterone 20.25 MG/1.25GM (1.62%) GEL Apply 1 application topically daily. For each shoulder     No current facility-administered medications for this visit.    Allergies  Allergen Reactions  . Procardia [Nifedipine] Rash    All over body    History   Social History  . Marital Status: Divorced    Spouse Name: N/A    Number of Children: 2  . Years of Education: Masters   Occupational History  .      retired   Social History Main Topics  . Smoking status: Former Smoker -- 30 years    Types: Cigarettes    Quit date: 06/17/1989  . Smokeless tobacco: Never Used  . Alcohol Use: 0.0 oz/week     Comment: 4-5 oz per day before dinner    . Drug Use: No  . Sexual Activity: Not Currently   Other Topics Concern  . Not on file   Social History Narrative   Patient is single and lives alone.   Patient is retired.   Patient has a Scientist, water quality.   Patient has two adult children.   Patient is right-handed.   Patient drinks one cup of coffee daily.      Pt lives  in [place] in his RV alone.   Retired Engineer, civil (consulting)    Family History  Problem Relation Age of Onset  . Pancreatic cancer Father   . CAD Father   . CAD Mother     ROS- All systems are reviewed and negative except as per the HPI above  Physical Exam: Filed Vitals:   12/18/14 1225  BP: 126/68  Pulse: 78  Height: 5\' 10"  (1.778 m)  Weight: 232 lb 3.2 oz (105.325 kg)    GEN- The patient is well appearing, alert and oriented x 3 today.   Head- normocephalic, atraumatic Eyes-  Sclera clear, conjunctiva pink Ears- hearing intact Oropharynx- clear Neck- supple,  Lungs- Clear to ausculation bilaterally, normal work of breathing Heart- regular rate and rhythm with 2/6 systolic murmur consistent with aortic stenosis. GI- soft, NT, ND, + BS Extremities- no clubbing, cyanosis, or edema MS- no significant deformity or atrophy Skin- no rash or lesion Psych- euthymic mood, full affect Neuro- strength and sensation are intact  EKG today reveals SR, 78 bpm, cannot r/o inf infarct, age undetermined, seen on previous EKG's. PVCs Epic records including Dr. Fabian November, [facility] Neurological Notes reviewed.  Echo 04/20/14- EF 45-50%, LA size 48 mm, mild aortic stenosis.  Assessment and Plan:  1. Persistent atrial fibrillation and atrial flutter Pt was instructed on last visit to stop amiodarone and has taken it inconsistently over the last few months, in SR today, but pt stating he is unaware when he is in or out of rhythm. Stop amiodarone due to increasing concerns with pt balance and falls, to see if any improvement with d/c of drug. Continue eliquis for  now, but is concerning with falls.  He should follow closely with primary care and neurology  2. OSA Compliance with CPAP is encouraged  3. CAD No ischemic symptoms No changes today  4. Chronic systolic and diastolic dysfunction EF is mildly depressed No changes today  5. Hypertensive cardiovascular disease with CHF Stable No change required today  6. Obesity Body mass index is 33.32 kg/(m^2). Weight loss is advised  7. Balance issues, memory lapses, sleep walking ,falls.Pt is concerned and thinks symptoms are progressive.Move up next neuro recheck from March to further evaluate symptoms. Pt was encouraged to discuss further with family re assisted living.  8. Follow-up with Dr. Tamala Julian in 3 months, afib clinic in 6 months.

## 2014-12-19 NOTE — Telephone Encounter (Signed)
I received a note from the patient's cardiac NP. The patient is having ongoing memory and balance issues. He has an appointment with me January 8th.

## 2014-12-22 ENCOUNTER — Ambulatory Visit: Payer: Medicare Other | Admitting: Adult Health

## 2014-12-31 ENCOUNTER — Other Ambulatory Visit: Payer: Self-pay | Admitting: Family Medicine

## 2015-01-01 NOTE — Telephone Encounter (Signed)
I renewed the medicine but have him come in for a med check

## 2015-01-01 NOTE — Telephone Encounter (Signed)
Is this okay?

## 2015-01-07 ENCOUNTER — Other Ambulatory Visit: Payer: Self-pay | Admitting: Interventional Cardiology

## 2015-01-30 ENCOUNTER — Other Ambulatory Visit: Payer: Self-pay | Admitting: Family Medicine

## 2015-01-30 NOTE — Telephone Encounter (Signed)
IS THIS OKAY 

## 2015-02-02 ENCOUNTER — Other Ambulatory Visit: Payer: Self-pay | Admitting: Physician Assistant

## 2015-02-28 NOTE — Therapy (Signed)
[facility] [address] [place], [place], [address] Phone: [phone]   Fax:  [phone]  Patient Details  Name: Aaron Berg MRN: [id number] Date of Birth: Aug 05, 1936 Referring Provider:  No ref. provider found  Encounter Date: 02/28/2015  PHYSICAL THERAPY DISCHARGE SUMMARY  Visits from Start of Care: 7  Current functional level related to goals / functional outcomes:     PT Short Term Goals - 10/18/14 1549    PT SHORT TERM GOAL #1   Title Pt will be able to be independent with HEP.  10/20/14   Time 0   Period Days   Status Achieved   PT SHORT TERM GOAL #2   Title Pt will be able to improve BERG balance score to >/=38/56 to improve balance.   Time 0   Period Days   Status Achieved   PT SHORT TERM GOAL #3   Title Pt will be able to improve timed up and go to < 18 sec for improved balance.   Time 0   Period Days   Status Achieved   PT SHORT TERM GOAL #4   Title Pt will be able to improve gait velocity to > 2.5 ft/sec for improved mobility.   Time 0   Period Days   Status Achieved   PT SHORT TERM GOAL #5   Title Pt will be able to ambulate > 300' with LRAD modified independent for improved mobility.   Time 0   Period Days   Status Not Met         PT Long Term Goals - 10/20/14 1603    PT LONG TERM GOAL #1   Title Pt will be able to verbalize understanding of fall prevention strategies within home environment.   Time --  11/17/14   Status On-going   PT LONG TERM GOAL #2   Title Pt will be able to improve BERG balance score to >/= 44/56 for improved balance.   Time 0   Period Weeks   Status Achieved   PT LONG TERM GOAL #3   Title Pt will be able to improve timed up and go to < 15 sec for improved balance.   Time 4   Period Weeks   Status On-going   PT LONG TERM GOAL #4   Title Pt will be able to improve gait velocity to > 2.62 ft/sec for improved mobility.   Time 0   Period Days   Status  Achieved   PT LONG TERM GOAL #5   Title Pt will be able to ambulate > 500' with LRAD modified independent for improved mobility on indoor/paved outdoor surfaces.   Time 4   Period Weeks   Status On-going   Additional Long Term Goals   Additional Long Term Goals Yes   PT LONG TERM GOAL #6   Title Pt will be able to improve BERG balance score to >/=50/56 for improve balance.   Time 4   Period Weeks   Status On-going   PT LONG TERM GOAL #7   Title Pt will be able to improve gait velocity to >2.80f/sec. for improve functional mobililty.   Time 4   Period Weeks   Status On-going        Remaining deficits: Unknown, as pt never returned after last PT session in 10/2014.   Education / Equipment: HEP  Plan: Patient agrees to discharge.  Patient goals were partially met. Patient is being discharged due to not returning since  the last visit.  ?????       Carlson Belland L 02/28/2015, 1:30 PM  [facility] [address] [place], [place], [address] Phone: [phone]   Fax:  [phone]   Geoffry Paradise, PT,DPT 02/28/2015 1:31 PM Phone: [phone] Fax: [phone]

## 2015-03-14 ENCOUNTER — Ambulatory Visit: Payer: Medicare Other | Admitting: Adult Health

## 2015-03-29 ENCOUNTER — Ambulatory Visit (INDEPENDENT_AMBULATORY_CARE_PROVIDER_SITE_OTHER): Payer: Medicare Other | Admitting: Family Medicine

## 2015-03-29 ENCOUNTER — Encounter: Payer: Self-pay | Admitting: Family Medicine

## 2015-03-29 ENCOUNTER — Other Ambulatory Visit: Payer: Self-pay | Admitting: Family Medicine

## 2015-03-29 VITALS — BP 120/80 | HR 76 | Ht 69.0 in | Wt 233.0 lb

## 2015-03-29 DIAGNOSIS — Z7901 Long term (current) use of anticoagulants: Secondary | ICD-10-CM

## 2015-03-29 DIAGNOSIS — I10 Essential (primary) hypertension: Secondary | ICD-10-CM

## 2015-03-29 DIAGNOSIS — Z96653 Presence of artificial knee joint, bilateral: Secondary | ICD-10-CM

## 2015-03-29 DIAGNOSIS — Z125 Encounter for screening for malignant neoplasm of prostate: Secondary | ICD-10-CM

## 2015-03-29 DIAGNOSIS — I509 Heart failure, unspecified: Secondary | ICD-10-CM | POA: Diagnosis not present

## 2015-03-29 DIAGNOSIS — I2510 Atherosclerotic heart disease of native coronary artery without angina pectoris: Secondary | ICD-10-CM | POA: Diagnosis not present

## 2015-03-29 DIAGNOSIS — I5042 Chronic combined systolic (congestive) and diastolic (congestive) heart failure: Secondary | ICD-10-CM

## 2015-03-29 DIAGNOSIS — H8110 Benign paroxysmal vertigo, unspecified ear: Secondary | ICD-10-CM | POA: Diagnosis not present

## 2015-03-29 DIAGNOSIS — F1010 Alcohol abuse, uncomplicated: Secondary | ICD-10-CM | POA: Diagnosis not present

## 2015-03-29 DIAGNOSIS — E291 Testicular hypofunction: Secondary | ICD-10-CM

## 2015-03-29 LAB — CBC WITH DIFFERENTIAL/PLATELET
BASOS ABS: 0 10*3/uL (ref 0.0–0.1)
Basophils Relative: 0 % (ref 0–1)
Eosinophils Absolute: 0.2 10*3/uL (ref 0.0–0.7)
Eosinophils Relative: 3 % (ref 0–5)
HCT: 40.1 % (ref 39.0–52.0)
HEMOGLOBIN: 13.9 g/dL (ref 13.0–17.0)
LYMPHS PCT: 22 % (ref 12–46)
Lymphs Abs: 1.2 10*3/uL (ref 0.7–4.0)
MCH: 32.3 pg (ref 26.0–34.0)
MCHC: 34.7 g/dL (ref 30.0–36.0)
MCV: 93 fL (ref 78.0–100.0)
MPV: 10.9 fL (ref 8.6–12.4)
Monocytes Absolute: 0.7 10*3/uL (ref 0.1–1.0)
Monocytes Relative: 13 % — ABNORMAL HIGH (ref 3–12)
NEUTROS ABS: 3.5 10*3/uL (ref 1.7–7.7)
NEUTROS PCT: 62 % (ref 43–77)
PLATELETS: 133 10*3/uL — AB (ref 150–400)
RBC: 4.31 MIL/uL (ref 4.22–5.81)
RDW: 14.7 % (ref 11.5–15.5)
WBC: 5.6 10*3/uL (ref 4.0–10.5)

## 2015-03-29 NOTE — Progress Notes (Signed)
   Subjective:    Patient ID: Aaron Berg, male    DOB: [birth year], 79 y.o.   MRN: [id number]  HPI He is here for evaluation of multiple issues. He recently had the knee replacement. He has had both knees replaced and was in rehabilitation for a while. He still not back to full functional capacity. He has had difficulty with dizziness and does note that certain positions make this much worse. He has had difficulty with this in the past and has been on meclizine. He does have underlying heart disease with ASHD as well as CHF. This has been fairly stable. He also has hypertension. Continues on testosterone. He started back on this approximately 3 weeks ago. He is also on anticoagulation medication due to a previous history of fib flutter.he has had difficulty with hip and back pain and apparently does have some underlying back issues. He plans to have more definitive care for this in the near future.he states he drinks one or 2 beverages per night. His exercise is usually walking. He also has concerns over his memory. He states that sometimes he loses single but then finds them very quickly.   Review of Systems  All other systems reviewed and are negative.      Objective:   Physical Exam Alert and in no distress. Tympanic membranes and canals are normal. Pharyngeal area is normal. Neck is supple without adenopathy or thyromegaly. Cardiac exam shows a regular  rhythm with a 3/6 SEM gallops. Lungs are clear to auscultation. Abdominal exam shows no masses or tenderness. Good hip motion without pain. Exam of the knees does show lack of full terminal extension with good flexion.clicking sensation noted with passive motion of the knees. Movement of his head does cause him to become quite dizzy.       Assessment & Plan:  ASHD (arteriosclerotic heart disease) - Plan: CBC with Differential/Platelet, Comprehensive metabolic panel, Lipid panel  BPPV (benign paroxysmal positional vertigo), unspecified  laterality - Plan: Ambulatory referral to Physical Therapy  Chronic combined systolic and diastolic CHF (congestive heart failure) - Plan: CBC with Differential/Platelet, Comprehensive metabolic panel, Lipid panel  Alcohol abuse, daily use  Essential hypertension, benign  Chronic anticoagulation  Hypogonadism male - Plan: Testosterone, PSA, Medicare  Status post total bilateral knee replacement - Plan: Ambulatory referral to Physical Therapy  Special screening for malignant neoplasm of prostate - Plan: PSA, Medicare he will be set up to do a MMSE. He is also to be set up for physical therapy to help work with full extension of his knees as well as a good rehabilitation program.he obviously has not fully involved in a good rehabilitation program. I will also have physical therapy work with him for Epley maneuvers. He will be following up with his cardiologist in the near future.

## 2015-03-30 LAB — LIPID PANEL
Cholesterol: 205 mg/dL — ABNORMAL HIGH (ref 0–200)
HDL: 53 mg/dL (ref >=40)
LDL Cholesterol: 105 mg/dL — ABNORMAL HIGH (ref 0–99)
Total CHOL/HDL Ratio: 3.9 Ratio
Triglycerides: 236 mg/dL — ABNORMAL HIGH (ref <150)
VLDL: 47 mg/dL — ABNORMAL HIGH (ref 0–40)

## 2015-03-30 LAB — PSA, MEDICARE: PSA: 4.91 ng/mL — AB (ref <=4.00)

## 2015-03-30 LAB — COMPREHENSIVE METABOLIC PANEL
ALT: 13 U/L (ref 0–53)
AST: 20 U/L (ref 0–37)
Albumin: 4.1 g/dL (ref 3.5–5.2)
Alkaline Phosphatase: 53 U/L (ref 39–117)
BUN: 22 mg/dL (ref 6–23)
CALCIUM: 9.9 mg/dL (ref 8.4–10.5)
CHLORIDE: 106 meq/L (ref 96–112)
CO2: 24 mEq/L (ref 19–32)
CREATININE: 0.98 mg/dL (ref 0.50–1.35)
Glucose, Bld: 81 mg/dL (ref 70–99)
Potassium: 4.5 mEq/L (ref 3.5–5.3)
Sodium: 140 mEq/L (ref 135–145)
Total Bilirubin: 0.7 mg/dL (ref 0.2–1.2)
Total Protein: 6.7 g/dL (ref 6.0–8.3)

## 2015-03-30 LAB — TESTOSTERONE: Testosterone: 391 ng/dL (ref 300–890)

## 2015-03-31 LAB — CP2131 PSA, TOTAL AND FREE
PSA FREE PCT: 8 % — AB (ref >25)
PSA, Free: 0.39 ng/mL
PSA: 5.07 ng/mL — ABNORMAL HIGH (ref <=4.00)

## 2015-04-11 ENCOUNTER — Ambulatory Visit (INDEPENDENT_AMBULATORY_CARE_PROVIDER_SITE_OTHER): Payer: Medicare Other | Admitting: Interventional Cardiology

## 2015-04-11 ENCOUNTER — Encounter: Payer: Self-pay | Admitting: Interventional Cardiology

## 2015-04-11 VITALS — BP 116/84 | HR 77 | Ht 68.0 in | Wt 238.0 lb

## 2015-04-11 DIAGNOSIS — I10 Essential (primary) hypertension: Secondary | ICD-10-CM

## 2015-04-11 DIAGNOSIS — Z01810 Encounter for preprocedural cardiovascular examination: Secondary | ICD-10-CM | POA: Diagnosis not present

## 2015-04-11 DIAGNOSIS — G4730 Sleep apnea, unspecified: Secondary | ICD-10-CM | POA: Diagnosis not present

## 2015-04-11 DIAGNOSIS — Z7901 Long term (current) use of anticoagulants: Secondary | ICD-10-CM | POA: Diagnosis not present

## 2015-04-11 DIAGNOSIS — I2510 Atherosclerotic heart disease of native coronary artery without angina pectoris: Secondary | ICD-10-CM

## 2015-04-11 DIAGNOSIS — I5042 Chronic combined systolic (congestive) and diastolic (congestive) heart failure: Secondary | ICD-10-CM

## 2015-04-11 MED ORDER — METOPROLOL SUCCINATE ER 25 MG PO TB24: 25.0000 mg | ORAL_TABLET | Freq: Every day | ORAL | Status: DC

## 2015-04-11 MED ORDER — APIXABAN 5 MG PO TABS: 5.0000 mg | ORAL_TABLET | Freq: Two times a day (BID) | ORAL | Status: DC

## 2015-04-11 NOTE — Patient Instructions (Signed)
Medication Instructions:  Your physician recommends that you continue on your current medications as directed. Please refer to the Current Medication list given to you today.   Labwork: None   Testing/Procedures: None   Follow-Up: Your physician wants you to follow-up in: 9-12 months with Dr.Smith You will receive a reminder letter in the mail two months in advance. If you don't receive a letter, please call our office to schedule the follow-up appointment.   Any Other Special Instructions Will Be Listed Below (If Applicable). An Rx for Eliquis and Metoprolol has been sent to [facility]

## 2015-04-11 NOTE — Progress Notes (Signed)
Cardiology Office Note   Date:  04/11/2015   ID:  Aaron Berg, DOB March 26, 1936, MRN [id number]  PCP:  Wyatt Haste, MD  Cardiologist:   Sinclair Grooms, MD   Chief Complaint  Patient presents with  . Congestive Heart Failure      History of Present Illness: Aaron Berg is a 79 y.o. male who presents for chronic combined systolic and diastolic heart failure, coronary artery disease with prior bypass grafting in 2004, chronic anticoagulation therapy, history of paroxysmal atrial fibrillation, and hypertension.  He is doing well. No, occasions on anticoagulation therapy. Denies orthopnea, PND, chest pain, and syncope. He has had no lower extremity swelling. Medication regimen has been well-tolerated.    Past Medical History  Diagnosis Date  . CAD (coronary artery disease) of bypass graft   . Dyslipidemia     well controlled  . Obesity   . Arthritis   . Male hypogonadism   . Gait disorder   . Hypertension   . Persistent atrial fibrillation 2003  . Sleep apnea     cpap  . CHF (congestive heart failure)   . Dry eyes   . Complication of anesthesia     "woke up too soon"  . Depression   . COPD (chronic obstructive pulmonary disease)     "no signs or tests"  . Headache(784.0)     silent migraines- no pain just go blind for just a minute  . Cancer     skin  . Memory loss, short term 2/15    per daughter since last surgery and placement at [facility]  "Hallucinations have stopped"  . Sleep disorder   . Multiple falls 08/09/2014  . Chronic systolic dysfunction of left ventricle   . Cerebrovascular disease 2006    multiple ischemic changes on prior MRI  . Sleep apnea     on CPAP  . Atrial flutter   . Hypertensive cardiovascular disease     Past Surgical History  Procedure Laterality Date  . Lumbar cyst      Dr. Joya Salm  . Shoulder surgery      Right with murphy, reattach ligaments  . Coronary artery bypass graft  2003    x 4  . Video assisted  thoracoscopy (vats)/thorocotomy  2002    benign nodule  . Joint replacement      right shoulder replaced  . Coronary angioplasty  1991    with stents placed  . Eye surgery Bilateral     cataract surgeries  . Eye surgery Right     detached retina  . Tonsillectomy  as child  . Appendectomy  as child  . Mohs surgery      on chest  . Total knee arthroplasty Right 01/02/2014    Procedure: RIGHT TOTAL KNEE ARTHROPLASTY;  Surgeon: Mauri Pole, MD;  Location: [facility];  Service: Orthopedics;  Laterality: Right;  . Total knee arthroplasty Left 01/31/2014    Procedure: LEFT TOTAL KNEE ARTHROPLASTY;  Surgeon: Mauri Pole, MD;  Location: [facility];  Service: Orthopedics;  Laterality: Left;  . Cardioversion N/A 04/26/2014    Procedure: CARDIOVERSION;  Surgeon: Sinclair Grooms, MD;  Location: [place];  Service: Cardiovascular;  Laterality: N/A;  . Cardioversion N/A 05/24/2014    Procedure: CARDIOVERSION;  Surgeon: Sinclair Grooms, MD;  Location: [facility];  Service: Cardiovascular;  Laterality: N/A;     Current Outpatient Prescriptions  Medication Sig Dispense Refill  . apixaban (ELIQUIS) 5 MG  TABS tablet Take 1 tablet (5 mg total) by mouth 2 (two) times daily. 180 tablet 3  . cholecalciferol (VITAMIN D) 1000 UNITS tablet Take 1,000 Units by mouth daily.     . CRESTOR 40 MG tablet TAKE 1 TABLET (40 MG TOTAL) DAILY WITH BREAKFAST FOR HYPERLIPIDEMIA 90 tablet 0  . FLUoxetine (PROZAC) 10 MG capsule TAKE ONE CAPSULE BY MOUTH EVERY DAY 30 capsule 1  . metoprolol succinate (TOPROL-XL) 25 MG 24 hr tablet Take 1 tablet (25 mg total) by mouth daily. 90 tablet 3  . Multiple Vitamins-Minerals (MULTIVITAMIN WITH MINERALS) tablet Take 1 tablet by mouth daily.    . naproxen sodium (ANAPROX) 220 MG tablet Take 220 mg by mouth 2 (two) times daily with a meal.    . potassium chloride SA (K-DUR,KLOR-CON) 20 MEQ tablet TAKE 1 TABLET BY MOUTH EVERY DAY 30 tablet 0  . sodium chloride (OCEAN) 0.65 % SOLN  nasal spray Place 1 spray into both nostrils daily as needed for congestion.     . Testosterone 20.25 MG/1.25GM (1.62%) GEL Apply 2 Squirts topically daily. One pump per shoulder.     No current facility-administered medications for this visit.    Allergies:   Procardia    Social History:  The patient  reports that he quit smoking about 25 years ago. His smoking use included Cigarettes. He quit after 30 years of use. He has never used smokeless tobacco. He reports that he drinks alcohol. He reports that he does not use illicit drugs.   Family History:  The patient's family history includes Arthritis in his sister; CAD in his father and mother; Healthy in his sister; Pancreatic cancer in his father.    ROS:  Please see the history of present illness.   Otherwise, review of systems are positive for complaining of discomfort in the left hip and posterior thigh that he feels may be related to his back. He is status post bilateral knee replacement surgeries and did well with those in the past 12 months. Contemplating lumbar spine surgery with Dr. Joya Salm..   All other systems are reviewed and negative.    PHYSICAL EXAM: VS:  BP 116/84 mmHg  Pulse 77  Ht 5\' 8"  (1.727 m)  Wt 238 lb (107.956 kg)  BMI 36.20 kg/m2  SpO2 95% , BMI Body mass index is 36.2 kg/(m^2). GEN: Well nourished, well developed, in no acute distress HEENT: normal Neck: no JVD, carotid bruits, or masses Cardiac: RRR. He does have a grade 2 to 3/6 systolic murmur at the right upper sternal border. He has no rubs, or gallops,no edema  Respiratory:  clear to auscultation bilaterally, normal work of breathing GI: soft, nontender, nondistended, + BS MS: no deformity or atrophy Skin: warm and dry, no rash Neuro:  Strength and sensation are intact Psych: euthymic mood, full affect   EKG:  EKG is not ordered today.    Recent Labs: 08/15/2014: TSH 1.120 03/29/2015: ALT 13; BUN 22; Creatinine 0.98; Hemoglobin 13.9; Platelets  133*; Potassium 4.5; Sodium 140    Lipid Panel    Component Value Date/Time   CHOL 205* 03/29/2015 0001   TRIG 236* 03/29/2015 0001   HDL 53 03/29/2015 0001   CHOLHDL 3.9 03/29/2015 0001   VLDL 47* 03/29/2015 0001   LDLCALC 105* 03/29/2015 0001      Wt Readings from Last 3 Encounters:  04/11/15 238 lb (107.956 kg)  03/29/15 233 lb (105.688 kg)  12/18/14 232 lb 3.2 oz (105.325 kg)  Other studies Reviewed: Additional studies/ records that were reviewed today include: .   ASSESSMENT AND PLAN:  Pre-operative cardiovascular examination: Asymptomatic from cardiac standpoint. No clinical evidence of heart failure or progression of ischemic heart disease. He is cleared for upcoming neurosurgical spine procedure. He is on Eliquis which will need to be managed around surgery.  Chronic anticoagulation: Eliquis with no complications  Essential hypertension, benign: Controlled  Coronary artery disease: No angina  Hyperlipidemia with target LDL less than 70  Chronic combined systolic and diastolic CHF (congestive heart failure) no evidence of volume overload  Sleep apnea: Excessive daytime sleepiness. Consider reevaluation     Current medicines are reviewed at length with the patient today.  The patient does not have concerns regarding medicines.  The following changes have been made:  We will refill apixaban and metoprolol succinate.  Labs/ tests ordered today include:  No orders of the defined types were placed in this encounter.     Disposition:   FU with Linard Millers  in 9 months  Signed, Sinclair Grooms, MD  04/11/2015 12:15 PM    [place], [place],   [address] Phone: [phone]; Fax: [phone]

## 2015-04-13 ENCOUNTER — Other Ambulatory Visit: Payer: Medicare Other

## 2015-04-13 ENCOUNTER — Ambulatory Visit: Payer: Medicare Other | Attending: Family Medicine | Admitting: Rehabilitative and Restorative Service Providers"

## 2015-04-13 ENCOUNTER — Other Ambulatory Visit: Payer: Self-pay | Admitting: Interventional Cardiology

## 2015-04-13 DIAGNOSIS — E669 Obesity, unspecified: Secondary | ICD-10-CM | POA: Diagnosis not present

## 2015-04-13 DIAGNOSIS — J449 Chronic obstructive pulmonary disease, unspecified: Secondary | ICD-10-CM | POA: Diagnosis not present

## 2015-04-13 DIAGNOSIS — G4730 Sleep apnea, unspecified: Secondary | ICD-10-CM | POA: Insufficient documentation

## 2015-04-13 DIAGNOSIS — R269 Unspecified abnormalities of gait and mobility: Secondary | ICD-10-CM | POA: Diagnosis not present

## 2015-04-13 DIAGNOSIS — I2510 Atherosclerotic heart disease of native coronary artery without angina pectoris: Secondary | ICD-10-CM | POA: Diagnosis not present

## 2015-04-13 DIAGNOSIS — E785 Hyperlipidemia, unspecified: Secondary | ICD-10-CM | POA: Diagnosis not present

## 2015-04-13 DIAGNOSIS — I509 Heart failure, unspecified: Secondary | ICD-10-CM | POA: Diagnosis not present

## 2015-04-13 DIAGNOSIS — I481 Persistent atrial fibrillation: Secondary | ICD-10-CM | POA: Insufficient documentation

## 2015-04-13 DIAGNOSIS — H8110 Benign paroxysmal vertigo, unspecified ear: Secondary | ICD-10-CM | POA: Insufficient documentation

## 2015-04-13 NOTE — Therapy (Signed)
[facility] [address] [place], [place], [address] Phone: [phone]   Fax:  [phone]  Physical Therapy Evaluation  Patient Details  Name: Aaron Berg MRN: [id number] Date of Birth: 1936/03/11 Referring Provider:  [name], MD  Encounter Date: 04/13/2015      PT End of Session - 04/13/15 1708    Visit Number 1   Number of Visits 8   Date for PT Re-Evaluation 06/13/15   PT Start Time 1325   PT Stop Time 1405   PT Time Calculation (min) 40 min   Equipment Utilized During Treatment Gait belt   Activity Tolerance Patient tolerated treatment well   Behavior During Therapy [facility] for tasks assessed/performed      Past Medical History  Diagnosis Date  . CAD (coronary artery disease) of bypass graft   . Dyslipidemia     well controlled  . Obesity   . Arthritis   . Male hypogonadism   . Gait disorder   . Hypertension   . Persistent atrial fibrillation 2003  . Sleep apnea     cpap  . CHF (congestive heart failure)   . Dry eyes   . Complication of anesthesia     "woke up too soon"  . Depression   . COPD (chronic obstructive pulmonary disease)     "no signs or tests"  . Headache(784.0)     silent migraines- no pain just go blind for just a minute  . Cancer     skin  . Memory loss, short term 2/15    per daughter since last surgery and placement at [facility].  "Hallucinations have stopped"  . Sleep disorder   . Multiple falls 08/09/2014  . Chronic systolic dysfunction of left ventricle   . Cerebrovascular disease 2006    multiple ischemic changes on prior MRI  . Sleep apnea     on CPAP  . Atrial flutter   . Hypertensive cardiovascular disease     Past Surgical History  Procedure Laterality Date  . Lumbar cyst      Dr. Joya Salm  . Shoulder surgery      Right with murphy, reattach ligaments  . Coronary artery bypass graft  2003    x 4  . Video assisted thoracoscopy (vats)/thorocotomy  2002     benign nodule  . Joint replacement      right shoulder replaced  . Coronary angioplasty  1991    with stents placed  . Eye surgery Bilateral     cataract surgeries  . Eye surgery Right     detached retina  . Tonsillectomy  as child  . Appendectomy  as child  . Mohs surgery      on chest  . Total knee arthroplasty Right 01/02/2014    Procedure: RIGHT TOTAL KNEE ARTHROPLASTY;  Surgeon: Mauri Pole, MD;  Location: [facility];  Service: Orthopedics;  Laterality: Right;  . Total knee arthroplasty Left 01/31/2014    Procedure: LEFT TOTAL KNEE ARTHROPLASTY;  Surgeon: Mauri Pole, MD;  Location: [facility];  Service: Orthopedics;  Laterality: Left;  . Cardioversion N/A 04/26/2014    Procedure: CARDIOVERSION;  Surgeon: Sinclair Grooms, MD;  Location: [facility]COPY;  Service: Cardiovascular;  Laterality: N/A;  . Cardioversion N/A 05/24/2014    Procedure: CARDIOVERSION;  Surgeon: Sinclair Grooms, MD;  Location: [facility];  Service: Cardiovascular;  Laterality: N/A;    There were no vitals filed for this visit.  Visit  Diagnosis:  Abnormality of gait      Subjective Assessment - 04/13/15 1328    Subjective The patient reports that he underwent knee replacements in 12/2013 and underwent SNF stay for inpatient rehab.  He felt that he completed rehab for knees and then developed an imbalance problem described as "step in the shower, close my eyes and fall over".  He reports difficulty ambulating in the dark.  He denies true vertigo unless he bends over and moves his head a certain way.    Pertinent History 12+ falls since d/c from SNF last year   Patient Stated Goals Patient's primary concern is balance.   Currently in Pain? Yes   Pain Score --  intermittent pain in various areas, PT to monitor, but no goal to follow due to nature of referral.            [facility] PT Assessment - 04/13/15 1335    Assessment   Medical Diagnosis BPPV, imbalance   Onset Date --  01/2014   Prior Therapy --   SNF in 2015, and then OP therapy 10/2014   Precautions   Precautions Fall  h/o BPPV   Balance Screen   Has the patient fallen in the past 6 months Yes   How many times? 12 in last year   Has the patient had a decrease in activity level because of a fear of falling?  Yes   Is the patient reluctant to leave their home because of a fear of falling?  No   Home Environment   Living Enviornment Private residence   Living Arrangements Alone   Type of [place], grassy surfaces and unpaved roads   [place] to enter  motor home   Entrance Stairs-Number of Steps --  Green --  for 1/2 of steps   Home Layout One level   [place] - single point   Additional Comments --  plans to seek independent/assisted living placement   Prior Function   Level of Independence Independent with basic ADLs;Independent with gait  falls frequently   Observation/Other Assessments   Focus on Therapeutic Outcomes (FOTO)  refused FOTO   Ambulation/Gait   Ambulation/Gait Yes   Gait velocity 2.34 ft/sec   Stairs Yes   Stairs Assistance 5: Supervision   Stair Management Technique Step to pattern;Two rails   Number of Stairs --  4   Standardized Balance Assessment   Standardized Balance Assessment Berg Balance Test;Timed Up and Go Test   Berg Balance Test   Sit to Stand Able to stand  independently using hands   Standing Unsupported Able to stand 2 minutes with supervision   Sitting with Back Unsupported but Feet Supported on Floor or Stool Able to sit safely and securely 2 minutes   Stand to Sit Sits safely with minimal use of hands   Transfers Able to transfer safely, definite need of hands   Standing Unsupported with Eyes Closed Able to stand 10 seconds with supervision   Standing Ubsupported with Feet Together Able to place feet together independently and stand 1 minute safely   From Standing, Reach Forward with Outstretched Arm Can reach forward >12 cm  safely (5")   From Standing Position, Pick up Object from Floor Able to pick up shoe, needs supervision  off balance + environmental movement   From Standing Position, Turn to Look Behind Over each Shoulder Needs supervision when turning   Turn 360 Degrees Able to  turn 360 degrees safely but slowly   Standing Unsupported, Alternately Place Feet on Step/Stool Needs assistance to keep from falling or unable to try   Standing Unsupported, One Foot in [employer] balance while stepping or standing   Standing on One Leg Tries to lift leg/unable to hold 3 seconds but remains standing independently   Total Score 34   Timed Up and Go Test   TUG --  11.64 seconds            Vestibular Assessment - 04/13/15 1339    Symptom Behavior   Type of Dizziness Spinning  unsteadiness, imbalance   Frequency of Dizziness --  intermittent   Duration of Dizziness --  seconds   Aggravating Factors --  bending with specific head movement   Relieving Factors Head stationary   Positional Testing   Sidelying Test Sidelying Right;Sidelying Left   Horizontal Canal Testing Horizontal Canal Right;Horizontal Canal Left   Sidelying Right   Sidelying Right Symptoms No nystagmus   Sidelying Left   Sidelying Left Symptoms No nystagmus   Horizontal Canal Right   Horizontal Canal Right Symptoms --  sensation of dizziness, nystagmus not viewed   Horizontal Canal Left   Horizontal Canal Left Symptoms --  sensation of dizziness 4-5/10, nystagmus not viewed        NEUROMUSCULAR RE-EDUCATION: Habituation HEP for rolling      PT Education - 04/13/15 1707    Education provided Yes   Education Details HEP: habituation rolling   Person(s) Educated Patient   Methods Explanation;Demonstration;Handout   Comprehension Verbalized understanding;Returned demonstration             PT Long Term Goals - 04/13/15 1708    PT LONG TERM GOAL #1   Title Pt will be able to verbalize understanding of fall  prevention strategies within home environment.   Baseline Target date 05/13/2015   Time 4   Period Weeks   PT LONG TERM GOAL #2   Title The patient will improve Berg from 34/56 up to 40/56 to demo decreasing risk for fall.   Baseline Target date 05/13/2015   Time 4   Period Weeks   PT LONG TERM GOAL #3   Title The patient will improve gait speed from 2.34 ft/sec up to 2.8 ft/sec to demo improving functional ambulation.   Baseline Target date 05/13/2015   Time 4   Period Weeks   PT LONG TERM GOAL #4   Title The patient will report no dizziness with bed mobility tasks.   Baseline Target date 05/13/2015   Time 4   Period Weeks   PT LONG TERM GOAL #5   Title The patient will negotiate unlevel surfaces with SPC modified indep to negotiate home environment safely.   Baseline Target date 05/13/2015   Time 4   Period Weeks               Plan - 04/13/15 1714    Clinical Impression Statement The patient is a 79 yo male presenting today with mild dizziness, imbalance worse with turns, general instability of gait with frequent falls.   Pt will benefit from skilled therapeutic intervention in order to improve on the following deficits Abnormal gait;Decreased balance;Difficulty walking;Postural dysfunction;Decreased mobility   Rehab Potential Good   PT Frequency 2x / week   PT Duration 4 weeks   PT Treatment/Interventions Therapeutic activities;Functional mobility training;Neuromuscular re-education;Therapeutic exercise;Gait training;Stair training;Balance training;Patient/family education   PT Next Visit Plan Check HEP for rolling, add balance  HEP, gait training (focus on unlevel surfaces as able due to home environment)   Consulted and Agree with Plan of Care Patient         Problem List Patient Active Problem List   Diagnosis Date Noted  . Atrial flutter 10/03/2014  . Multiple falls 08/09/2014  . Chronic anticoagulation 06/30/2014  . Atrial fibrillation 03/03/2014  .  Protein-calorie malnutrition, severe 02/03/2014  . Unspecified arthropathy, lower leg 01/29/2014  . Chronic airway obstruction, not elsewhere classified 01/29/2014  . Constipation 01/13/2014  . Obesity (BMI 30-39.9) 01/04/2014  . S/P left TKA 01/02/2014  . Chronic combined systolic and diastolic CHF (congestive heart failure) 10/11/2013  . Essential hypertension, benign 10/11/2013  . ASHD (arteriosclerotic heart disease) 07/09/2011  . Hyperlipidemia with target LDL less than 70 07/09/2011  . Sleep apnea 07/09/2011  . Alcohol abuse, daily use 07/09/2011  . Hypogonadism male 07/09/2011    Karmina Zufall, PT 04/13/2015, 5:17 PM  [facility] [address] [place], [place], [address] Phone: [phone]   Fax:  [phone]

## 2015-04-13 NOTE — Patient Instructions (Signed)
Rolling   With pillow under head, start on back. Roll slowly to right. Hold position until symptoms subside. Roll slowly onto left side. Hold position until symptoms subside. Repeat sequence _5___ times per session. Do __2__ sessions per day.   After doing exercise, plan to sit and rest for 10 minutes before walking.  Copyright  VHI. All rights reserved.

## 2015-04-25 ENCOUNTER — Ambulatory Visit: Payer: Medicare Other | Attending: Family Medicine

## 2015-04-25 VITALS — BP 109/65 | HR 64

## 2015-04-25 DIAGNOSIS — H8110 Benign paroxysmal vertigo, unspecified ear: Secondary | ICD-10-CM | POA: Insufficient documentation

## 2015-04-25 DIAGNOSIS — J449 Chronic obstructive pulmonary disease, unspecified: Secondary | ICD-10-CM | POA: Insufficient documentation

## 2015-04-25 DIAGNOSIS — I509 Heart failure, unspecified: Secondary | ICD-10-CM | POA: Diagnosis not present

## 2015-04-25 DIAGNOSIS — E785 Hyperlipidemia, unspecified: Secondary | ICD-10-CM | POA: Diagnosis not present

## 2015-04-25 DIAGNOSIS — G4730 Sleep apnea, unspecified: Secondary | ICD-10-CM | POA: Insufficient documentation

## 2015-04-25 DIAGNOSIS — R269 Unspecified abnormalities of gait and mobility: Secondary | ICD-10-CM | POA: Diagnosis not present

## 2015-04-25 DIAGNOSIS — I481 Persistent atrial fibrillation: Secondary | ICD-10-CM | POA: Insufficient documentation

## 2015-04-25 DIAGNOSIS — E669 Obesity, unspecified: Secondary | ICD-10-CM | POA: Diagnosis not present

## 2015-04-25 DIAGNOSIS — I2510 Atherosclerotic heart disease of native coronary artery without angina pectoris: Secondary | ICD-10-CM | POA: Insufficient documentation

## 2015-04-25 NOTE — Patient Instructions (Signed)
Perform all balance exercises in a corner with a chair in front of you for safety OR stand behind sink with a chair behind for you:    Copyright  VHI. All rights reserved.  Feet Apart (Compliant Surface) Head Motion - Eyes Open   With eyes open, standing on compliant surface: __pillow______, feet shoulder width apart, move head slowly: up and down and side to side for 30 seconds. Repeat __3__ times per session. Do __1__ sessions per day.  Copyright  VHI. All rights reserved.  Copyright  VHI. All rights reserved.    Copyright  VHI. All rights reserved.  Single Leg - Eyes Open   Holding support, lift right leg while maintaining balance over other leg. Progress to removing hands from support surface for longer periods of time. Repeat with other leg lifted. Hold_10-30___ seconds. Repeat __3__ times per session. Do __1__ sessions per day.  Copyright  VHI. All rights reserved.  Feet Partial Heel-Toe, Varied Arm Positions - Eyes Open   With eyes open, right foot partially in front of the other, arms out, look straight ahead at a stationary object. Hold _30___ seconds. Repeat with left foot in front. Repeat _3___ times per session. Do __1__ sessions per day.  Copyright  VHI. All rights reserved.  Feet Together, Head Motion - Eyes Open   With eyes open, feet together, move head slowly: up and down and side to side for 30 seconds. Repeat __3__ times per session. Do _1___ sessions per day.  Copyright  VHI. All rights reserved.     Copyright  VHI. All rights reserved.  Feet Together, Varied Arm Positions - Eyes Closed   Stand with feet together and arms out. Close eyes and visualize upright position. Hold __10-30__ seconds. Repeat __3__ times per session. Do __1__ sessions per day.  Copyright  VHI. All rights reserved.

## 2015-04-25 NOTE — Therapy (Addendum)
[facility] [address] [place], [place], [address] Phone: [phone]   Fax:  [phone]  Physical Therapy Treatment  Patient Details  Name: Aaron Berg MRN: [id number] Date of Birth: 07/27/1936 Referring Provider:  [name], MD  Encounter Date: 04/25/2015      PT End of Session - 04/25/15 2030    Visit Number 2   Number of Visits 8   Date for PT Re-Evaluation 06/13/15   Authorization Type G-code every 10th visit.   PT Start Time 1533   PT Stop Time 1615   PT Time Calculation (min) 42 min   Equipment Utilized During Treatment Gait belt   Activity Tolerance Other (comment)  limited by wooziness   Behavior During Therapy [facility] for tasks assessed/performed      Past Medical History  Diagnosis Date  . CAD (coronary artery disease) of bypass graft   . Dyslipidemia     well controlled  . Obesity   . Arthritis   . Male hypogonadism   . Gait disorder   . Hypertension   . Persistent atrial fibrillation 2003  . Sleep apnea     cpap  . CHF (congestive heart failure)   . Dry eyes   . Complication of anesthesia     "woke up too soon"  . Depression   . COPD (chronic obstructive pulmonary disease)     "no signs or tests"  . Headache(784.0)     silent migraines- no pain just go blind for just a minute  . Cancer     skin  . Memory loss, short term 2/15    per daughter since last surgery and placement at [facility]  "Hallucinations have stopped"  . Sleep disorder   . Multiple falls 08/09/2014  . Chronic systolic dysfunction of left ventricle   . Cerebrovascular disease 2006    multiple ischemic changes on prior MRI  . Sleep apnea     on CPAP  . Atrial flutter   . Hypertensive cardiovascular disease     Past Surgical History  Procedure Laterality Date  . Lumbar cyst      Dr. Joya Salm  . Shoulder surgery      Right with murphy, reattach ligaments  . Coronary artery bypass graft  2003    x 4  .  Video assisted thoracoscopy (vats)/thorocotomy  2002    benign nodule  . Joint replacement      right shoulder replaced  . Coronary angioplasty  1991    with stents placed  . Eye surgery Bilateral     cataract surgeries  . Eye surgery Right     detached retina  . Tonsillectomy  as child  . Appendectomy  as child  . Mohs surgery      on chest  . Total knee arthroplasty Right 01/02/2014    Procedure: RIGHT TOTAL KNEE ARTHROPLASTY;  Surgeon: Mauri Pole, MD;  Location: [facility];  Service: Orthopedics;  Laterality: Right;  . Total knee arthroplasty Left 01/31/2014    Procedure: LEFT TOTAL KNEE ARTHROPLASTY;  Surgeon: Mauri Pole, MD;  Location: [facility];  Service: Orthopedics;  Laterality: Left;  . Cardioversion N/A 04/26/2014    Procedure: CARDIOVERSION;  Surgeon: Sinclair Grooms, MD;  Location: [place];  Service: Cardiovascular;  Laterality: N/A;  . Cardioversion N/A 05/24/2014    Procedure: CARDIOVERSION;  Surgeon: Sinclair Grooms, MD;  Location: [place];  Service: Cardiovascular;  Laterality: N/A;  Filed Vitals:   04/25/15 1540 04/25/15 1552  BP: 120/80 109/65  Pulse: 61 64    Visit Diagnosis:  Abnormality of gait      Subjective Assessment - 04/25/15 1540    Subjective Pt denied falls since last visit. Pt reported dizziness is not really an issue at this time and he finds the rolling exercises "boring". Pt reported he was doing better but he feels that his balance has been "off" since he arrived to PT, after driving today.  Pt reported wooziness upon walking from his truck to our building was 5-7/10 but ceased upon sitting. Pt reported it does not feel like positional vertigo (no spinning).   Pertinent History 12+ falls since d/c from SNF last year   Patient Stated Goals Patient's primary concern is balance.   Currently in Pain? Yes   Pain Score --  4-5/10   Pain Location Foot   Pain Orientation Left   Pain Descriptors / Indicators Sharp   Pain Type Chronic  pain   Pain Onset More than a month ago   Pain Frequency Intermittent   Aggravating Factors  standing/walking on L foot   Pain Relieving Factors rest (non-weight bearing), pt reported no pain with palpation        Neuro re-ed: Performed in corner with chair in front of pt for safety: 2-3 sets per activity with B LEs, with 10-30 second holds. No UE support, except for 1 UE support during tandem and single leg stance. All on non-compliant surface, unless otherwise noted. -Feet apart/together with eyes open/closed, feet apart/together while performing head turns (on compliant and non-compliant surfaces), tandem stance, modified tandem stance, single leg stance. Pt required seated rest break after activities due to fatigue and L foot pain.                          PT Education - 04/25/15 2029    Education provided Yes   Education Details PT educated pt on the importance of performing rolling exercises. PT also educated pt on orthostatic hypotension and the importance waiting for lightheadend/wooziness to subside when performing supine to sit and sit to stand transfers, in order to decrease falls risk.   Person(s) Educated Patient   Methods Explanation   Comprehension Verbalized understanding             PT Long Term Goals - 04/25/15 2035    PT LONG TERM GOAL #1   Title Pt will be able to verbalize understanding of fall prevention strategies within home environment.   Baseline Target date 05/13/2015   Time 4   Period Weeks   Status On-going   PT LONG TERM GOAL #2   Title The patient will improve Berg from 34/56 up to 40/56 to demo decreasing risk for fall.   Baseline Target date 05/13/2015   Time 4   Period Weeks   Status On-going   PT LONG TERM GOAL #3   Title The patient will improve gait speed from 2.34 ft/sec up to 2.8 ft/sec to demo improving functional ambulation.   Baseline Target date 05/13/2015   Time 4   Period Weeks   Status On-going   PT LONG  TERM GOAL #4   Title The patient will report no dizziness with bed mobility tasks.   Baseline Target date 05/13/2015   Time 4   Period Weeks   Status On-going   PT LONG TERM GOAL #5   Title The patient will  negotiate unlevel surfaces with SPC modified indep to negotiate home environment safely.   Baseline Target date 05/13/2015   Time 4   Period Weeks   Status On-going               Plan - 04/25/15 2031    Clinical Impression Statement Pt tolerated standing balance HEP well but did require rest breaks during session due to wooziness/lightheadedness.  Pt's diastolic BP decreased by >80DXIP when transitioning for sit to stand position, indicating orthostatic hypotension. Pt noted to experience increased postural sway with narrow BOS and eyes closed activities. Pt would continue to benefit from skilled PT to improve safety during functional mobility.   Pt will benefit from skilled therapeutic intervention in order to improve on the following deficits Abnormal gait;Decreased balance;Difficulty walking;Postural dysfunction;Decreased mobility   Rehab Potential Good   PT Frequency 2x / week   PT Duration 4 weeks   PT Treatment/Interventions Therapeutic activities;Functional mobility training;Neuromuscular re-education;Therapeutic exercise;Gait training;Stair training;Balance training;Patient/family education   PT Next Visit Plan Check HEP for rolling (did not have time to assess last visit due to orthostatics) and gait training (focus on unlevel surfaces as able due to home environment)   Consulted and Agree with Plan of Care Patient        Problem List Patient Active Problem List   Diagnosis Date Noted  . Atrial flutter 10/03/2014  . Multiple falls 08/09/2014  . Chronic anticoagulation 06/30/2014  . Atrial fibrillation 03/03/2014  . Protein-calorie malnutrition, severe 02/03/2014  . Unspecified arthropathy, lower leg 01/29/2014  . Chronic airway obstruction, not elsewhere  classified 01/29/2014  . Constipation 01/13/2014  . Obesity (BMI 30-39.9) 01/04/2014  . S/P left TKA 01/02/2014  . Chronic combined systolic and diastolic CHF (congestive heart failure) 10/11/2013  . Essential hypertension, benign 10/11/2013  . ASHD (arteriosclerotic heart disease) 07/09/2011  . Hyperlipidemia with target LDL less than 70 07/09/2011  . Sleep apnea 07/09/2011  . Alcohol abuse, daily use 07/09/2011  . Hypogonadism male 07/09/2011    Maanav Kassabian L 04/25/2015, 8:36 PM  [facility] [address] [place], [place], [address] Phone: [phone]   Fax:  [phone]     Geoffry Paradise, PT,DPT 04/25/2015 8:36 PM Phone: [phone] Fax: [phone]

## 2015-04-30 ENCOUNTER — Ambulatory Visit: Payer: Medicare Other | Admitting: Rehabilitative and Restorative Service Providers"

## 2015-04-30 DIAGNOSIS — I2510 Atherosclerotic heart disease of native coronary artery without angina pectoris: Secondary | ICD-10-CM | POA: Diagnosis not present

## 2015-04-30 DIAGNOSIS — E669 Obesity, unspecified: Secondary | ICD-10-CM | POA: Diagnosis not present

## 2015-04-30 DIAGNOSIS — R269 Unspecified abnormalities of gait and mobility: Secondary | ICD-10-CM

## 2015-04-30 DIAGNOSIS — H8110 Benign paroxysmal vertigo, unspecified ear: Secondary | ICD-10-CM | POA: Diagnosis not present

## 2015-04-30 DIAGNOSIS — M6281 Muscle weakness (generalized): Secondary | ICD-10-CM

## 2015-04-30 DIAGNOSIS — E785 Hyperlipidemia, unspecified: Secondary | ICD-10-CM | POA: Diagnosis not present

## 2015-04-30 DIAGNOSIS — I481 Persistent atrial fibrillation: Secondary | ICD-10-CM | POA: Diagnosis not present

## 2015-04-30 NOTE — Patient Instructions (Signed)
Functional Quadriceps: Sit to Stand   Sit on edge of chair, feet flat on floor. Stand upright, extending knees fully. Repeat _10___ times per set. Do _2___ sets per session. Do __1-2__ sessions per day.  http://orth.exer.us/734   Copyright  VHI. All rights reserved.  Heel Raise: Bilateral (Standing)   HOLD ONTO COUNTERTOP OR CHAIR/DRESSER.  Rise on balls of feet. Repeat __10__ times per set. Do __2__ sets per session. Do __1-2__ sessions per day.  http://orth.exer.us/38   Copyright  VHI. All rights reserved.  Toe Up   Gently rise up on toes and back on heels. Repeat __20__ times. Do __1-2__ sessions per day.  http://gt2.exer.us/455   Copyright  VHI. All rights reserved.  Feet Apart, Varied Arm Positions - Eyes Closed   Stand with feet shoulder width apart and arms out. Close eyes and visualize upright position. Hold __10__ seconds. Repeat _3___ times per session. Do __1-2__ sessions per day.  Copyright  VHI. All rights reserved.  Feet Partial Heel-Toe, Varied Arm Positions - Eyes Open   With eyes open, right foot partially in front of the other, arms out, look straight ahead at a stationary object. Hold __30__ seconds, then switch feet. Repeat __3__ times per session. Do __1-2__ sessions per day.  Copyright  VHI. All rights reserved.

## 2015-04-30 NOTE — Therapy (Signed)
[facility] [address] [place], [place], [address] Phone: [phone]   Fax:  [phone]  Physical Therapy Treatment  Patient Details  Name: Aaron Berg MRN: [id number] Date of Birth: 1936-05-08 Referring Provider:  [name], MD  Encounter Date: 04/30/2015      PT End of Session - 04/30/15 2136    Visit Number 3   Number of Visits 8   Date for PT Re-Evaluation 06/13/15   Authorization Type G-code every 10th visit.   PT Start Time 1238   PT Stop Time 1322   PT Time Calculation (min) 44 min   Equipment Utilized During Treatment Gait belt   Activity Tolerance Patient tolerated treatment well   Behavior During Therapy WFL for tasks assessed/performed      Past Medical History  Diagnosis Date  . CAD (coronary artery disease) of bypass graft   . Dyslipidemia     well controlled  . Obesity   . Arthritis   . Male hypogonadism   . Gait disorder   . Hypertension   . Persistent atrial fibrillation 2003  . Sleep apnea     cpap  . CHF (congestive heart failure)   . Dry eyes   . Complication of anesthesia     "woke up too soon"  . Depression   . COPD (chronic obstructive pulmonary disease)     "no signs or tests"  . Headache(784.0)     silent migraines- no pain just go blind for just a minute  . Cancer     skin  . Memory loss, short term 2/15    per daughter since last surgery and placement at [facility].  "Hallucinations have stopped"  . Sleep disorder   . Multiple falls 08/09/2014  . Chronic systolic dysfunction of left ventricle   . Cerebrovascular disease 2006    multiple ischemic changes on prior MRI  . Sleep apnea     on CPAP  . Atrial flutter   . Hypertensive cardiovascular disease     Past Surgical History  Procedure Laterality Date  . Lumbar cyst      Dr. Joya Salm  . Shoulder surgery      Right with murphy, reattach ligaments  . Coronary artery bypass graft  2003    x 4  . Video  assisted thoracoscopy (vats)/thorocotomy  2002    benign nodule  . Joint replacement      right shoulder replaced  . Coronary angioplasty  1991    with stents placed  . Eye surgery Bilateral     cataract surgeries  . Eye surgery Right     detached retina  . Tonsillectomy  as child  . Appendectomy  as child  . Mohs surgery      on chest  . Total knee arthroplasty Right 01/02/2014    Procedure: RIGHT TOTAL KNEE ARTHROPLASTY;  Surgeon: Mauri Pole, MD;  [facility];  Service: Orthopedics;  Laterality: Right;  . Total knee arthroplasty Left 01/31/2014    Procedure: LEFT TOTAL KNEE ARTHROPLASTY;  Surgeon: Mauri Pole, MD;  [facility];  Service: Orthopedics;  Laterality: Left;  . Cardioversion N/A 04/26/2014    Procedure: CARDIOVERSION;  Surgeon: Sinclair Grooms, MD;  Location: [place];  Service: Cardiovascular;  Laterality: N/A;  . Cardioversion N/A 05/24/2014    Procedure: CARDIOVERSION;  Surgeon: Sinclair Grooms, MD;  Location: [place];  Service: Cardiovascular;  Laterality: N/A;    There were  no vitals filed for this visit.  Visit Diagnosis:  Abnormality of gait  Muscle weakness (generalized)      Subjective Assessment - 04/30/15 1242    Subjective The patient reports rolling does not provoke symtpoms at this time- it has improved since the first day.  He reports occasional L LE pain from radiating back pain. He also notes dizziness upon rising.  No falls since last week.  "If I close my eyes, I'm in trouble."   Currently in Pain? No/denies          [facility] Adult PT Treatment/Exercise - 04/30/15 1245    Ambulation/Gait   Ambulation/Gait Yes   Ambulation/Gait Assistance 4: Min guard   Ambulation Distance (Feet) --  345 ft   Gait Pattern --  decreased heel strike   Ambulation Surface Level   Gait Comments Patient needs cues for longer stride length and improved heel strike      NEUROMUSCULAR RE-EDUCATION: Corner balance exercises consisting  of: Standing on level surface with feet wide progressing to 1/2 tandem and then feet together with eyes open progressing to eyes closed for short intervals with CGA to min A for safety. Standing countertop wall bmps Wall bumps at wall with eyes closed with cues to use available somatosensory feedback Heel and toe raises (toe raises performed in sitting due to weakness R > L sides) Sit>stand x 10 reps with cues on posture        PT Education - 04/30/15 1333    Education provided Yes   Education Details HEP: sit<>stand, heel raises standing, toe raises seated, corner with feet apart/eyes closed, corner with partial tandem and eyes open   Person(s) Educated Patient   Methods Explanation;Demonstration;Handout   Comprehension Returned demonstration;Verbalized understanding             PT Long Term Goals - 04/25/15 2035    PT LONG TERM GOAL #1   Title Pt will be able to verbalize understanding of fall prevention strategies within home environment.   Baseline Target date 05/13/2015   Time 4   Period Weeks   Status On-going   PT LONG TERM GOAL #2   Title The patient will improve Berg from 34/56 up to 40/56 to demo decreasing risk for fall.   Baseline Target date 05/13/2015   Time 4   Period Weeks   Status On-going   PT LONG TERM GOAL #3   Title The patient will improve gait speed from 2.34 ft/sec up to 2.8 ft/sec to demo improving functional ambulation.   Baseline Target date 05/13/2015   Time 4   Period Weeks   Status On-going   PT LONG TERM GOAL #4   Title The patient will report no dizziness with bed mobility tasks.   Baseline Target date 05/13/2015   Time 4   Period Weeks   Status On-going   PT LONG TERM GOAL #5   Title The patient will negotiate unlevel surfaces with SPC modified indep to negotiate home environment safely.   Baseline Target date 05/13/2015   Time 4   Period Weeks   Status On-going               Plan - 04/30/15 2137    Clinical Impression  Statement The patient c/o dizziness upon standing, he reports rolling no longer provokes symptoms.  HEP provided for LE strength and balance.  Patient continues with instability during narrow base and eyes closed activities.   PT Next Visit Plan Check HEP, balance  with narrow BOS, balance with eyes closed, gait training, fall education.   Consulted and Agree with Plan of Care Patient        Problem List Patient Active Problem List   Diagnosis Date Noted  . Atrial flutter 10/03/2014  . Multiple falls 08/09/2014  . Chronic anticoagulation 06/30/2014  . Atrial fibrillation 03/03/2014  . Protein-calorie malnutrition, severe 02/03/2014  . Unspecified arthropathy, lower leg 01/29/2014  . Chronic airway obstruction, not elsewhere classified 01/29/2014  . Constipation 01/13/2014  . Obesity (BMI 30-39.9) 01/04/2014  . S/P left TKA 01/02/2014  . Chronic combined systolic and diastolic CHF (congestive heart failure) 10/11/2013  . Essential hypertension, benign 10/11/2013  . ASHD (arteriosclerotic heart disease) 07/09/2011  . Hyperlipidemia with target LDL less than 70 07/09/2011  . Sleep apnea 07/09/2011  . Alcohol abuse, daily use 07/09/2011  . Hypogonadism male 07/09/2011    Aleeyah Bensen, PT 04/30/2015, 9:51 PM  [facility] [address] [place], [place], [address] Phone: [phone]   Fax:  [phone]

## 2015-05-02 ENCOUNTER — Ambulatory Visit: Payer: Medicare Other

## 2015-05-02 DIAGNOSIS — I481 Persistent atrial fibrillation: Secondary | ICD-10-CM | POA: Diagnosis not present

## 2015-05-02 DIAGNOSIS — R269 Unspecified abnormalities of gait and mobility: Secondary | ICD-10-CM

## 2015-05-02 DIAGNOSIS — H8110 Benign paroxysmal vertigo, unspecified ear: Secondary | ICD-10-CM | POA: Diagnosis not present

## 2015-05-02 DIAGNOSIS — E669 Obesity, unspecified: Secondary | ICD-10-CM | POA: Diagnosis not present

## 2015-05-02 DIAGNOSIS — M6281 Muscle weakness (generalized): Secondary | ICD-10-CM

## 2015-05-02 DIAGNOSIS — I2510 Atherosclerotic heart disease of native coronary artery without angina pectoris: Secondary | ICD-10-CM | POA: Diagnosis not present

## 2015-05-02 DIAGNOSIS — E785 Hyperlipidemia, unspecified: Secondary | ICD-10-CM | POA: Diagnosis not present

## 2015-05-02 NOTE — Patient Instructions (Addendum)
Fall Prevention and Home Safety Falls cause injuries and can affect all age groups. It is possible to use preventive measures to significantly decrease the likelihood of falls. There are many simple measures which can make your home safer and prevent falls. OUTDOORS  Repair cracks and edges of walkways and driveways.  Remove high doorway thresholds.  Trim shrubbery on the main path into your home.  Have good outside lighting.  Clear walkways of tools, rocks, debris, and clutter.  Check that handrails are not broken and are securely fastened. Both sides of steps should have handrails.  Have leaves, snow, and ice cleared regularly.  Use sand or salt on walkways during winter months.  In the garage, clean up grease or oil spills. BATHROOM  Install night lights.  Install grab bars by the toilet and in the tub and shower.  Use non-skid mats or decals in the tub or shower.  Place a plastic non-slip stool in the shower to sit on, if needed.  Keep floors dry and clean up all water on the floor immediately.  Remove soap buildup in the tub or shower on a regular basis.  Secure bath mats with non-slip, double-sided rug tape.  Remove throw rugs and tripping hazards from the floors. BEDROOMS  Install night lights.  Make sure a bedside light is easy to reach.  Do not use oversized bedding.  Keep a telephone by your bedside.  Have a firm chair with side arms to use for getting dressed.  Remove throw rugs and tripping hazards from the floor. KITCHEN  Keep handles on pots and pans turned toward the center of the stove. Use back burners when possible.  Clean up spills quickly and allow time for drying.  Avoid walking on wet floors.  Avoid hot utensils and knives.  Position shelves so they are not too high or low.  Place commonly used objects within easy reach.  If necessary, use a sturdy step stool with a grab bar when reaching.  Keep electrical cables out of the  way.  Do not use floor polish or wax that makes floors slippery. If you must use wax, use non-skid floor wax.  Remove throw rugs and tripping hazards from the floor. STAIRWAYS  Never leave objects on stairs.  Place handrails on both sides of stairways and use them. Fix any loose handrails. Make sure handrails on both sides of the stairways are as long as the stairs.  Check carpeting to make sure it is firmly attached along stairs. Make repairs to worn or loose carpet promptly.  Avoid placing throw rugs at the top or bottom of stairways, or properly secure the rug with carpet tape to prevent slippage. Get rid of throw rugs, if possible.  Have an electrician put in a light switch at the top and bottom of the stairs. OTHER FALL PREVENTION TIPS  Wear low-heel or rubber-soled shoes that are supportive and fit well. Wear closed toe shoes.  When using a stepladder, make sure it is fully opened and both spreaders are firmly locked. Do not climb a closed stepladder.  Add color or contrast paint or tape to grab bars and handrails in your home. Place contrasting color strips on first and last steps.  Learn and use mobility aids as needed. Install an electrical emergency response system.  Turn on lights to avoid dark areas. Replace light bulbs that burn out immediately. Get light switches that glow.  Arrange furniture to create clear pathways. Keep furniture in the same place.    Firmly attach carpet with non-skid or double-sided tape.  Eliminate uneven floor surfaces.  Select a carpet pattern that does not visually hide the edge of steps.  Be aware of all pets. OTHER HOME SAFETY TIPS  Set the water temperature for 120 F (48.8 C).  Keep emergency numbers on or near the telephone.  Keep smoke detectors on every level of the home and near sleeping areas. Document Released: 11/21/2002 Document Revised: 06/01/2012 Document Reviewed: 02/20/2012 [facility] Patient Information 2015  [place], [place]. This information is not intended to replace advice given to you by your health care provider. Make sure you discuss any questions you have with your health care provider.                 ANKLE: Eversion, Unilateral (Band)   Place band around left foot. Keeping heel in place, raise toes of banded foot up and away from body. Do not move hip or knees. Hold _2__ seconds. Use __yellow______ band. Repeat with right foot. _10__ reps per set, __3_ sets per day, _3__ days per week  Copyright  VHI. All rights reserved.

## 2015-05-02 NOTE — Therapy (Signed)
[facility] [address] [place], [place], [address] Phone: [phone]   Fax:  [phone]  Physical Therapy Treatment  Patient Details  Name: Aaron Berg MRN: [id number] Date of Birth: 05/29/1936 Referring Provider:  [name], MD  Encounter Date: 05/02/2015      PT End of Session - 05/02/15 1405    Visit Number 4   Number of Visits 8   Date for PT Re-Evaluation 06/13/15   Authorization Type G-code every 10th visit.   PT Start Time 1316   PT Stop Time 1359   PT Time Calculation (min) 43 min   Activity Tolerance Patient tolerated treatment well   Behavior During Therapy WFL for tasks assessed/performed      Past Medical History  Diagnosis Date  . CAD (coronary artery disease) of bypass graft   . Dyslipidemia     well controlled  . Obesity   . Arthritis   . Male hypogonadism   . Gait disorder   . Hypertension   . Persistent atrial fibrillation 2003  . Sleep apnea     cpap  . CHF (congestive heart failure)   . Dry eyes   . Complication of anesthesia     "woke up too soon"  . Depression   . COPD (chronic obstructive pulmonary disease)     "no signs or tests"  . Headache(784.0)     silent migraines- no pain just go blind for just a minute  . Cancer     skin  . Memory loss, short term 2/15    per daughter since last surgery and placement at [facility].  "Hallucinations have stopped"  . Sleep disorder   . Multiple falls 08/09/2014  . Chronic systolic dysfunction of left ventricle   . Cerebrovascular disease 2006    multiple ischemic changes on prior MRI  . Sleep apnea     on CPAP  . Atrial flutter   . Hypertensive cardiovascular disease     Past Surgical History  Procedure Laterality Date  . Lumbar cyst      Dr. Joya Salm  . Shoulder surgery      Right with murphy, reattach ligaments  . Coronary artery bypass graft  2003    x 4  . Video assisted thoracoscopy (vats)/thorocotomy  2002   benign nodule  . Joint replacement      right shoulder replaced  . Coronary angioplasty  1991    with stents placed  . Eye surgery Bilateral     cataract surgeries  . Eye surgery Right     detached retina  . Tonsillectomy  as child  . Appendectomy  as child  . Mohs surgery      on chest  . Total knee arthroplasty Right 01/02/2014    Procedure: RIGHT TOTAL KNEE ARTHROPLASTY;  Surgeon: Mauri Pole, MD;  Location: [facility];  Service: Orthopedics;  Laterality: Right;  . Total knee arthroplasty Left 01/31/2014    Procedure: LEFT TOTAL KNEE ARTHROPLASTY;  Surgeon: Mauri Pole, MD;  Location: [facility];  Service: Orthopedics;  Laterality: Left;  . Cardioversion N/A 04/26/2014    Procedure: CARDIOVERSION;  Surgeon: Sinclair Grooms, MD;  Location: [facility];  Service: Cardiovascular;  Laterality: N/A;  . Cardioversion N/A 05/24/2014    Procedure: CARDIOVERSION;  Surgeon: Sinclair Grooms, MD;  Location: [place];  Service: Cardiovascular;  Laterality: N/A;    There were no vitals filed for this visit.  Visit Diagnosis:  Muscle weakness (generalized)  Abnormality of gait      Subjective Assessment - 05/02/15 1320    Subjective Pt reported L LE pain stopped but yesterday L toes began to hurt. Pt denied falls since last visit. Pt reported he's been busy trying to obtain a new license/debit card as he lost his wallet. Pt reported he tried walking without SPC at home but felt unsteady.   Pertinent History 12+ falls since d/c from SNF last year   Patient Stated Goals Patient's primary concern is balance.   Currently in Pain? Yes   Pain Score 1    Pain Location Leg   Pain Orientation Right;Left  B thighs   Pain Descriptors / Indicators Tender   Pain Type Acute pain   Pain Onset Today   Pain Frequency Intermittent   Aggravating Factors  sit to stand   Pain Relieving Factors rest      Neuro re-ed: Performed at counter/sink with pt progressing from B UE support to no UE support.  All with supervision to ensure safety. VC's for technique. -Feet apart with eyes closed, B modified tandem stance. Pt noted to experience increased postural sway during both activities. -Standing heel/toe raises x20 with cues to decrease trunk flexion. -Sit<>stand x10 without UE support. VC's to improve weight shifting.  Therex: -Seated: B ankle eversion x10 without band and x20/LE with yellow theraband. VC's and tactile cues to keep knee still. -Seated: heel/toe raises x20.                           PT Education - 05/02/15 1402    Education provided Yes   Education Details Reviewed HEP (sit<>stand, heel raises standing, heel/toe raises seated, and balance) and added B ankle eversion HEP. PT reiterated the importance of using SPC at all times. Fall prevention handout.   Person(s) Educated Patient   Methods Explanation;Demonstration;Verbal cues;Handout;Tactile cues   Comprehension Verbalized understanding;Returned demonstration;Need further instruction  review ankle eversion             PT Long Term Goals - 04/25/15 2035    PT LONG TERM GOAL #1   Title Pt will be able to verbalize understanding of fall prevention strategies within home environment.   Baseline Target date 05/13/2015   Time 4   Period Weeks   Status On-going   PT LONG TERM GOAL #2   Title The patient will improve Berg from 34/56 up to 40/56 to demo decreasing risk for fall.   Baseline Target date 05/13/2015   Time 4   Period Weeks   Status On-going   PT LONG TERM GOAL #3   Title The patient will improve gait speed from 2.34 ft/sec up to 2.8 ft/sec to demo improving functional ambulation.   Baseline Target date 05/13/2015   Time 4   Period Weeks   Status On-going   PT LONG TERM GOAL #4   Title The patient will report no dizziness with bed mobility tasks.   Baseline Target date 05/13/2015   Time 4   Period Weeks   Status On-going   PT LONG TERM GOAL #5   Title The patient will negotiate  unlevel surfaces with SPC modified indep to negotiate home environment safely.   Baseline Target date 05/13/2015   Time 4   Period Weeks   Status On-going               Plan - 05/02/15 1405    Clinical Impression  Statement Pt continues to experience difficulty with narrow BOS and eyes closed balance activities. Pt also exhibited B foot supination during heel raises at counter (L>R), therefore, PT provided seated B ankle eversion strengthening HEP. Pt continues to require prompting to use SPC during ambulation to improve safety and reduce falls risk. Continue with POC.   Pt will benefit from skilled therapeutic intervention in order to improve on the following deficits Abnormal gait;Decreased balance;Difficulty walking;Postural dysfunction;Decreased mobility   Rehab Potential Good   PT Frequency 2x / week   PT Duration 4 weeks   PT Next Visit Plan Check B ankle eversion HEP. balance with narrow BOS, balance with eyes closed, gait training.   Consulted and Agree with Plan of Care Patient        Problem List Patient Active Problem List   Diagnosis Date Noted  . Atrial flutter 10/03/2014  . Multiple falls 08/09/2014  . Chronic anticoagulation 06/30/2014  . Atrial fibrillation 03/03/2014  . Protein-calorie malnutrition, severe 02/03/2014  . Unspecified arthropathy, lower leg 01/29/2014  . Chronic airway obstruction, not elsewhere classified 01/29/2014  . Constipation 01/13/2014  . Obesity (BMI 30-39.9) 01/04/2014  . S/P left TKA 01/02/2014  . Chronic combined systolic and diastolic CHF (congestive heart failure) 10/11/2013  . Essential hypertension, benign 10/11/2013  . ASHD (arteriosclerotic heart disease) 07/09/2011  . Hyperlipidemia with target LDL less than 70 07/09/2011  . Sleep apnea 07/09/2011  . Alcohol abuse, daily use 07/09/2011  . Hypogonadism male 07/09/2011    Justinian Miano L 05/02/2015, 2:09 PM  [facility] [address] [place], [place], [address] Phone: [phone]   Fax:  [phone]     Geoffry Paradise, PT,DPT 05/02/2015 2:09 PM Phone: [phone] Fax: [phone]

## 2015-05-07 ENCOUNTER — Ambulatory Visit: Payer: Medicare Other

## 2015-05-07 DIAGNOSIS — M6281 Muscle weakness (generalized): Secondary | ICD-10-CM

## 2015-05-07 DIAGNOSIS — I2510 Atherosclerotic heart disease of native coronary artery without angina pectoris: Secondary | ICD-10-CM | POA: Diagnosis not present

## 2015-05-07 DIAGNOSIS — I481 Persistent atrial fibrillation: Secondary | ICD-10-CM | POA: Diagnosis not present

## 2015-05-07 DIAGNOSIS — E785 Hyperlipidemia, unspecified: Secondary | ICD-10-CM | POA: Diagnosis not present

## 2015-05-07 DIAGNOSIS — E669 Obesity, unspecified: Secondary | ICD-10-CM | POA: Diagnosis not present

## 2015-05-07 DIAGNOSIS — R269 Unspecified abnormalities of gait and mobility: Secondary | ICD-10-CM

## 2015-05-07 DIAGNOSIS — H8110 Benign paroxysmal vertigo, unspecified ear: Secondary | ICD-10-CM | POA: Diagnosis not present

## 2015-05-07 NOTE — Therapy (Signed)
[facility] [address] [place], [place], [address] Phone: [phone]   Fax:  [phone]  Physical Therapy Treatment  Patient Details  Name: Aaron Berg MRN: [id number] Date of Birth: 1936-10-03 Referring Provider:  [name], MD  Encounter Date: 05/07/2015      PT End of Session - 05/07/15 1701    Visit Number 5   Number of Visits 8   Date for PT Re-Evaluation 06/13/15   Authorization Type G-code every 10th visit.   PT Start Time 1230   PT Stop Time 1315   PT Time Calculation (min) 45 min      Past Medical History  Diagnosis Date  . CAD (coronary artery disease) of bypass graft   . Dyslipidemia     well controlled  . Obesity   . Arthritis   . Male hypogonadism   . Gait disorder   . Hypertension   . Persistent atrial fibrillation 2003  . Sleep apnea     cpap  . CHF (congestive heart failure)   . Dry eyes   . Complication of anesthesia     "woke up too soon"  . Depression   . COPD (chronic obstructive pulmonary disease)     "no signs or tests"  . Headache(784.0)     silent migraines- no pain just go blind for just a minute  . Cancer     skin  . Memory loss, short term 2/15    per daughter since last surgery and placement at [facility].  "Hallucinations have stopped"  . Sleep disorder   . Multiple falls 08/09/2014  . Chronic systolic dysfunction of left ventricle   . Cerebrovascular disease 2006    multiple ischemic changes on prior MRI  . Sleep apnea     on CPAP  . Atrial flutter   . Hypertensive cardiovascular disease     Past Surgical History  Procedure Laterality Date  . Lumbar cyst      Dr. Joya Salm  . Shoulder surgery      Right with murphy, reattach ligaments  . Coronary artery bypass graft  2003    x 4  . Video assisted thoracoscopy (vats)/thorocotomy  2002    benign nodule  . Joint replacement      right shoulder replaced  . Coronary angioplasty  1991    with stents  placed  . Eye surgery Bilateral     cataract surgeries  . Eye surgery Right     detached retina  . Tonsillectomy  as child  . Appendectomy  as child  . Mohs surgery      on chest  . Total knee arthroplasty Right 01/02/2014    Procedure: RIGHT TOTAL KNEE ARTHROPLASTY;  Surgeon: Mauri Pole, MD;  Location: [facility];  Service: Orthopedics;  Laterality: Right;  . Total knee arthroplasty Left 01/31/2014    Procedure: LEFT TOTAL KNEE ARTHROPLASTY;  Surgeon: Mauri Pole, MD;  Location: [facility];  Service: Orthopedics;  Laterality: Left;  . Cardioversion N/A 04/26/2014    Procedure: CARDIOVERSION;  Surgeon: Sinclair Grooms, MD;  Location: [facility]OSCOPY;  Service: Cardiovascular;  Laterality: N/A;  . Cardioversion N/A 05/24/2014    Procedure: CARDIOVERSION;  Surgeon: Sinclair Grooms, MD;  Location: [facility];  Service: Cardiovascular;  Laterality: N/A;    There were no vitals filed for this visit.  Visit Diagnosis:  Abnormality of gait  Muscle weakness (generalized)      Subjective Assessment -  05/07/15 1236    Subjective Pt reports he found his wallet, had a productive weekend.    Currently in Pain? Yes   Pain Score 3    Pain Location Foot   Pain Orientation Left   Aggravating Factors  hurts while walking       -Reviewed LLE eversion exercise with yellow theraband (previous HEP) with pt reporting pain at the anterior tibialis tendon. The pain was not present when theraband was removed and pt performed eversion without resistance, and pain increased with increased resistance. Pt instructed to only perform this HEP every other day, and without resistance until the pain resolves.  Narrow base of support balance training: 3 laps at counter  forward/backward tandem walking with single UE support and close supervision 3 laps at counter braiding  Corner balance: -Static standing on solid surface with fingertip support with feet together x30 seconds, then modified tandem L, then R  with fingertip support and supervision, then performed each of these with eyes closed and supervision, then with eyes open pt practiced turning to look over shoulder in modified tandem 10x each side leading with each leg, noted to have decreased gaze stabilization (worse when turning right) - Static standing on compliant surface (2 pillows) with eyes closed without UE support for increased reliance on vestibular input requiring CGA due to multidirectional postural sway. Progressed to standing on pillows with eyes closed with concurrent horizontal head turns. Pt required finger tip support and intermittent Min A due to intermittent posterior LOB. - During seated rest breaks, pt educated on the following: 1) difference between positional vertigo and disequilibrium with head movement during dynamic standing; 2) impaired VOR with as reason for blurred vision with horizontal head turns; and 3) vestibular hypofunction as result of immobility. Pt verbalized understanding. -Practiced seated saccades with pt noted to undershoot (with corrective saccade to visualize target)                                PT Long Term Goals - 04/25/15 2035    PT LONG TERM GOAL #1   Title Pt will be able to verbalize understanding of fall prevention strategies within home environment.   Baseline Target date 05/13/2015   Time 4   Period Weeks   Status On-going   PT LONG TERM GOAL #2   Title The patient will improve Berg from 34/56 up to 40/56 to demo decreasing risk for fall.   Baseline Target date 05/13/2015   Time 4   Period Weeks   Status On-going   PT LONG TERM GOAL #3   Title The patient will improve gait speed from 2.34 ft/sec up to 2.8 ft/sec to demo improving functional ambulation.   Baseline Target date 05/13/2015   Time 4   Period Weeks   Status On-going   PT LONG TERM GOAL #4   Title The patient will report no dizziness with bed mobility tasks.   Baseline Target date 05/13/2015   Time  4   Period Weeks   Status On-going   PT LONG TERM GOAL #5   Title The patient will negotiate unlevel surfaces with SPC modified indep to negotiate home environment safely.   Baseline Target date 05/13/2015   Time 4   Period Weeks   Status On-going               Plan - 05/07/15 1701    Clinical Impression Statement Pt demonstrates undershooting  when performing saccades, and demonstrates poor gaze stabilization when turning to look over shoulder. This is likely contributing to some of his balance impairment. Did not add exercises to HEP as pt feels that he already has a lot of exercises. Will plan to address gaze/eye coordination during therapy sessions to i mprove balance, decrease dizziness and risk of falls.   PT Next Visit Plan check to see if ankle eversion continues to be painful, continue with balance with eyes closed, also work on gaze stabilization, saccades, turning leading with eyes.        Problem List Patient Active Problem List   Diagnosis Date Noted  . Atrial flutter 10/03/2014  . Multiple falls 08/09/2014  . Chronic anticoagulation 06/30/2014  . Atrial fibrillation 03/03/2014  . Protein-calorie malnutrition, severe 02/03/2014  . Unspecified arthropathy, lower leg 01/29/2014  . Chronic airway obstruction, not elsewhere classified 01/29/2014  . Constipation 01/13/2014  . Obesity (BMI 30-39.9) 01/04/2014  . S/P left TKA 01/02/2014  . Chronic combined systolic and diastolic CHF (congestive heart failure) 10/11/2013  . Essential hypertension, benign 10/11/2013  . ASHD (arteriosclerotic heart disease) 07/09/2011  . Hyperlipidemia with target LDL less than 70 07/09/2011  . Sleep apnea 07/09/2011  . Alcohol abuse, daily use 07/09/2011  . Hypogonadism male 07/09/2011   Delrae Sawyers, PT,DPT,NCS 05/07/2015 5:09 PM Phone [phone] FAX [phone]         [facility] [address] [place], [place], [address] Phone: [phone]   Fax:  [phone]

## 2015-05-09 ENCOUNTER — Ambulatory Visit: Payer: Medicare Other | Admitting: Rehabilitative and Restorative Service Providers"

## 2015-05-09 DIAGNOSIS — M6281 Muscle weakness (generalized): Secondary | ICD-10-CM

## 2015-05-09 DIAGNOSIS — H8110 Benign paroxysmal vertigo, unspecified ear: Secondary | ICD-10-CM | POA: Diagnosis not present

## 2015-05-09 DIAGNOSIS — I2510 Atherosclerotic heart disease of native coronary artery without angina pectoris: Secondary | ICD-10-CM | POA: Diagnosis not present

## 2015-05-09 DIAGNOSIS — R269 Unspecified abnormalities of gait and mobility: Secondary | ICD-10-CM

## 2015-05-09 DIAGNOSIS — E669 Obesity, unspecified: Secondary | ICD-10-CM | POA: Diagnosis not present

## 2015-05-09 DIAGNOSIS — E785 Hyperlipidemia, unspecified: Secondary | ICD-10-CM | POA: Diagnosis not present

## 2015-05-09 DIAGNOSIS — I481 Persistent atrial fibrillation: Secondary | ICD-10-CM | POA: Diagnosis not present

## 2015-05-09 NOTE — Therapy (Signed)
[facility] [address] [place], [place], [address] Phone: [phone]   Fax:  [phone]  Physical Therapy Treatment  Patient Details  Name: Aaron Berg MRN: [id number] Date of Birth: Apr 23, 1936 Referring Provider:  [name], MD  Encounter Date: 05/09/2015      PT End of Session - 05/09/15 1251    Visit Number 6   Number of Visits 8   Date for PT Re-Evaluation 06/13/15   Authorization Type G-code every 10th visit.   PT Start Time 1238   PT Stop Time 1320   PT Time Calculation (min) 42 min   Equipment Utilized During Treatment Gait belt   Activity Tolerance Patient tolerated treatment well   Behavior During Therapy WFL for tasks assessed/performed      Past Medical History  Diagnosis Date  . CAD (coronary artery disease) of bypass graft   . Dyslipidemia     well controlled  . Obesity   . Arthritis   . Male hypogonadism   . Gait disorder   . Hypertension   . Persistent atrial fibrillation 2003  . Sleep apnea     cpap  . CHF (congestive heart failure)   . Dry eyes   . Complication of anesthesia     "woke up too soon"  . Depression   . COPD (chronic obstructive pulmonary disease)     "no signs or tests"  . Headache(784.0)     silent migraines- no pain just go blind for just a minute  . Cancer     skin  . Memory loss, short term 2/15    per daughter since last surgery and placement at [facility].  "Hallucinations have stopped"  . Sleep disorder   . Multiple falls 08/09/2014  . Chronic systolic dysfunction of left ventricle   . Cerebrovascular disease 2006    multiple ischemic changes on prior MRI  . Sleep apnea     on CPAP  . Atrial flutter   . Hypertensive cardiovascular disease     Past Surgical History  Procedure Laterality Date  . Lumbar cyst      Dr. Joya Salm  . Shoulder surgery      Right with murphy, reattach ligaments  . Coronary artery bypass graft  2003    x 4  . Video  assisted thoracoscopy (vats)/thorocotomy  2002    benign nodule  . Joint replacement      right shoulder replaced  . Coronary angioplasty  1991    with stents placed  . Eye surgery Bilateral     cataract surgeries  . Eye surgery Right     detached retina  . Tonsillectomy  as child  . Appendectomy  as child  . Mohs surgery      on chest  . Total knee arthroplasty Right 01/02/2014    Procedure: RIGHT TOTAL KNEE ARTHROPLASTY;  Surgeon: Mauri Pole, MD;  Location: [facility];  Service: Orthopedics;  Laterality: Right;  . Total knee arthroplasty Left 01/31/2014    Procedure: LEFT TOTAL KNEE ARTHROPLASTY;  Surgeon: Mauri Pole, MD;  Location: [facility];  Service: Orthopedics;  Laterality: Left;  . Cardioversion N/A 04/26/2014    Procedure: CARDIOVERSION;  Surgeon: Sinclair Grooms, MD;  Location: [facility];  Service: Cardiovascular;  Laterality: N/A;  . Cardioversion N/A 05/24/2014    Procedure: CARDIOVERSION;  Surgeon: Sinclair Grooms, MD;  Location: [facility];  Service: Cardiovascular;  Laterality: N/A;    There were  no vitals filed for this visit.  Visit Diagnosis:  Abnormality of gait  Muscle weakness (generalized)      Subjective Assessment - 05/09/15 1241    Subjective The dizziness seems to be getting better with therapy, balance unchanged, and pain in L leg worsening.   Currently in Pain? Yes   Pain Score 3   pain awakened patient at night, no pain sitting, but hurts with walking   Pain Location Leg   Pain Orientation Left   Pain Descriptors / Indicators Tender   Pain Type Acute pain   Pain Onset In the past 7 days   Pain Frequency Intermittent   Aggravating Factors  painful with walking, night pain that awakened patient    Pain Relieving Factors rest      Gait: Ambulation x 345 ft x 2 without a device with CGA for safety- pt reports fatigue. Emphasis on longer stride, decreased foot dragging and heel strike Backwards walking 10 ft x 5 reps with CGA for  safety  THERAPEUTIC EXERCISE: Rolling a tennis ball under your foot With tennis ball under ball of foot, toe flexion/extension Ankle DF/PF seated for A/ROM Sit<>stand x 10 reps without UE support Sidestepping x 10 ft x 5 reps R and L side Standing heel cord stretch   *Pt shoes removed with indention where strap of L shoe presses into foot where pt c/o pain.  PT loosened velcro shoe strap and recommended patient wear socks for improved support.  No increased redness, swelling, or warmth noted over calf musculature. Pt also c/o toe pain on the L foot with ambulation.  PT recommended patient call MD if pain continues to worsen.        PT Long Term Goals - 04/25/15 2035    PT LONG TERM GOAL #1   Title Pt will be able to verbalize understanding of fall prevention strategies within home environment.   Baseline Target date 05/13/2015   Time 4   Period Weeks   Status On-going   PT LONG TERM GOAL #2   Title The patient will improve Berg from 34/56 up to 40/56 to demo decreasing risk for fall.   Baseline Target date 05/13/2015   Time 4   Period Weeks   Status On-going   PT LONG TERM GOAL #3   Title The patient will improve gait speed from 2.34 ft/sec up to 2.8 ft/sec to demo improving functional ambulation.   Baseline Target date 05/13/2015   Time 4   Period Weeks   Status On-going   PT LONG TERM GOAL #4   Title The patient will report no dizziness with bed mobility tasks.   Baseline Target date 05/13/2015   Time 4   Period Weeks   Status On-going   PT LONG TERM GOAL #5   Title The patient will negotiate unlevel surfaces with SPC modified indep to negotiate home environment safely.   Baseline Target date 05/13/2015   Time 4   Period Weeks   Status On-going               Plan - 05/09/15 1448    Clinical Impression Statement Patient has recent changes in pain in L LE.  Patient educated on shoewear and use of socks to decrease pain.  Continue to LTGs, begin checking goals to  determine renewal vs. d/c.   PT Next Visit Plan Check LTGs, determine renewal vs. d/c based on patient reports, objective measures.   Consulted and Agree with Plan of Care Patient  Problem List Patient Active Problem List   Diagnosis Date Noted  . Atrial flutter 10/03/2014  . Multiple falls 08/09/2014  . Chronic anticoagulation 06/30/2014  . Atrial fibrillation 03/03/2014  . Protein-calorie malnutrition, severe 02/03/2014  . Unspecified arthropathy, lower leg 01/29/2014  . Chronic airway obstruction, not elsewhere classified 01/29/2014  . Constipation 01/13/2014  . Obesity (BMI 30-39.9) 01/04/2014  . S/P left TKA 01/02/2014  . Chronic combined systolic and diastolic CHF (congestive heart failure) 10/11/2013  . Essential hypertension, benign 10/11/2013  . ASHD (arteriosclerotic heart disease) 07/09/2011  . Hyperlipidemia with target LDL less than 70 07/09/2011  . Sleep apnea 07/09/2011  . Alcohol abuse, daily use 07/09/2011  . Hypogonadism male 07/09/2011    Trevor Wilkie, PT 05/09/2015, 2:49 PM  [facility] [address] [place], [place], [address] Phone: [phone]   Fax:  [phone]

## 2015-05-16 ENCOUNTER — Ambulatory Visit: Payer: Medicare Other | Attending: Family Medicine | Admitting: Rehabilitative and Restorative Service Providers"

## 2015-05-16 DIAGNOSIS — R269 Unspecified abnormalities of gait and mobility: Secondary | ICD-10-CM | POA: Diagnosis not present

## 2015-05-16 DIAGNOSIS — M6281 Muscle weakness (generalized): Secondary | ICD-10-CM | POA: Diagnosis not present

## 2015-05-16 NOTE — Patient Instructions (Signed)

## 2015-05-16 NOTE — Therapy (Signed)
[facility] [address] [place], [place], [address] Phone: [phone]   Fax:  [phone]  Physical Therapy Treatment  Patient Details  Name: Aaron Berg MRN: [id number] Date of Birth: [birth year] Referring Provider:  [name], MD  Encounter Date: 05/16/2015      PT End of Session - 05/16/15 1316    Visit Number 7   Number of Visits 16   Date for PT Re-Evaluation 06/15/15   Authorization Type G-code every 10th visit.   PT Start Time 1239   PT Stop Time 1322   PT Time Calculation (min) 43 min   Equipment Utilized During Treatment Gait belt   Activity Tolerance Patient tolerated treatment well   Behavior During Therapy WFL for tasks assessed/performed      Past Medical History  Diagnosis Date  . CAD (coronary artery disease) of bypass graft   . Dyslipidemia     well controlled  . Obesity   . Arthritis   . Male hypogonadism   . Gait disorder   . Hypertension   . Persistent atrial fibrillation 2003  . Sleep apnea     cpap  . CHF (congestive heart failure)   . Dry eyes   . Complication of anesthesia     "woke up too soon"  . Depression   . COPD (chronic obstructive pulmonary disease)     "no signs or tests"  . Headache(784.0)     silent migraines- no pain just go blind for just a minute  . Cancer     skin  . Memory loss, short term 2/15    per daughter since last surgery and placement at [facility]  "Hallucinations have stopped"  . Sleep disorder   . Multiple falls 08/09/2014  . Chronic systolic dysfunction of left ventricle   . Cerebrovascular disease 2006    multiple ischemic changes on prior MRI  . Sleep apnea     on CPAP  . Atrial flutter   . Hypertensive cardiovascular disease     Past Surgical History  Procedure Laterality Date  . Lumbar cyst      Dr. Joya Salm  . Shoulder surgery      Right with murphy, reattach ligaments  . Coronary artery bypass graft  2003    x 4  . Video  assisted thoracoscopy (vats)/thorocotomy  2002    benign nodule  . Joint replacement      right shoulder replaced  . Coronary angioplasty  1991    with stents placed  . Eye surgery Bilateral     cataract surgeries  . Eye surgery Right     detached retina  . Tonsillectomy  as child  . Appendectomy  as child  . Mohs surgery      on chest  . Total knee arthroplasty Right 01/02/2014    Procedure: RIGHT TOTAL KNEE ARTHROPLASTY;  Surgeon: Mauri Pole, MD;  Location: [facility];  Service: Orthopedics;  Laterality: Right;  . Total knee arthroplasty Left 01/31/2014    Procedure: LEFT TOTAL KNEE ARTHROPLASTY;  Surgeon: Mauri Pole, MD;  Location: [facility];  Service: Orthopedics;  Laterality: Left;  . Cardioversion N/A 04/26/2014    Procedure: CARDIOVERSION;  Surgeon: Sinclair Grooms, MD;  Location: [facility] [facility];  Service: Cardiovascular;  Laterality: N/A;  . Cardioversion N/A 05/24/2014    Procedure: CARDIOVERSION;  Surgeon: Sinclair Grooms, MD;  Location: [employer];  Service: Cardiovascular;  Laterality: N/A;    There were  no vitals filed for this visit.  Visit Diagnosis:  Abnormality of gait  Muscle weakness (generalized)      Subjective Assessment - 05/16/15 1242    Subjective The patient reports no falls.  He notes he is not doing L ankle exercise due to pain that was present last week.   "Some days I can walk without pain". The patient feels comfortable walking around his environment and in community places, such as Paediatric nurse.   Patient Stated Goals Pt reports that he would like to work on stamina and lower extremity strength   Currently in Pain? Yes   Pain Score 1    Pain Location Ankle   Pain Orientation Left   Pain Descriptors / Indicators Tender   Pain Type Acute pain   Pain Onset 1 to 4 weeks ago   Pain Frequency Intermittent   Aggravating Factors  painful with walking at times   Pain Relieving Factors rest     SELF CARE/HOME MANAGEMENT: Reviewed falls prevention and  home safety checklist Discussed goals of PT and recommendations for post d/c activities.  PT to decrease frequency to 1x/week to progress activity to patient tolerance   NEUROMUSCULAR RE-EDUCATION: Berg=45/56 Sit<>stand Standing alternating foot taps to 6" surface  Gait: Ambulation level/unlevel surfaces with close supervision on pine straw surfaces and on grassy surfaces using SPC Ambulation on level surfaces with cues on longer stride length and heel strike bilaterally x 300 ft x 2 reps       [facility] PT Assessment - 05/16/15 1244    Ambulation/Gait   Ambulation/Gait Yes   Ambulation/Gait Assistance 6: Modified independent (Device/Increase time)   Ambulation Surface Level   Gait velocity 2.21 ft/sec   Gait Comments Doesn't use cane in the house   Standardized Balance Assessment   Standardized Balance Assessment Berg Balance Test   Berg Balance Test   Sit to Stand Able to stand without using hands and stabilize independently   Standing Unsupported Able to stand safely 2 minutes   Sitting with Back Unsupported but Feet Supported on Floor or Stool Able to sit safely and securely 2 minutes   Stand to Sit Sits safely with minimal use of hands   Transfers Able to transfer safely, minor use of hands   Standing Unsupported with Eyes Closed Able to stand 10 seconds safely   Standing Ubsupported with Feet Together Able to place feet together independently and stand 1 minute safely   From Standing, Reach Forward with Outstretched Arm Can reach forward >12 cm safely (5")  7"   From Standing Position, Pick up Object from Floor Able to pick up shoe safely and easily   From Standing Position, Turn to Look Behind Over each Shoulder Looks behind from both sides and weight shifts well   Turn 360 Degrees Able to turn 360 degrees safely but slowly   Standing Unsupported, Alternately Place Feet on Step/Stool Able to complete >2 steps/needs minimal assist   Standing Unsupported, One Foot in Front Able  to take small step independently and hold 30 seconds   Standing on One Leg Tries to lift leg/unable to hold 3 seconds but remains standing independently   Total Score 45           PT Education - 05/16/15 1308    Education provided Yes   Education Details Fall prevention handout for home   Person(s) Educated Patient   Methods Explanation   Comprehension Verbalized understanding  PT Long Term Goals - 05/16/15 1243    PT LONG TERM GOAL #1   Title Pt will be able to verbalize understanding of fall prevention strategies within home environment.   Baseline Met on 05/16/2015   Time 4   Period Weeks   Status Achieved   PT LONG TERM GOAL #2   Title The patient will improve Berg from 34/56 up to 40/56 to demo decreasing risk for fall.   Baseline Met on 05/16/2015 with patient improving to 45/56.   Time 4   Period Weeks   Status Achieved   PT LONG TERM GOAL #3   Title The patient will improve gait speed from 2.34 ft/sec up to 2.8 ft/sec to demo improving functional ambulation.   Baseline Patient gait speed 2.2 ft/sec at his "comfortable pace" with SPC modified indep   Time 4   Period Weeks   Status Not Met   PT LONG TERM GOAL #4   Title The patient will report no dizziness with bed mobility tasks.  New Target date 06/15/2015   Baseline IMproved, but not resolved.  Pt reports that he has occasional flashes of dizziness.   Time 4   Period Weeks   Status On-going   PT LONG TERM GOAL #5   Title The patient will negotiate unlevel surfaces with SPC modified indep to negotiate home environment safely.   Baseline New Target date 06/15/2015   Time 4   Period Weeks   Status On-going   Additional Long Term Goals   Additional Long Term Goals Yes   PT LONG TERM GOAL #6   Title The patient will return demo HEP progression for post d/c activities.   Baseline Target date 06/15/2015   Time 4   Period Weeks               Plan - 05/16/15 1315    Clinical Impression  Statement The patient met Berg LTG.  He c/o continued diminished stamina and general weakness in legs.  PT to continue balance training and gait training with patient participating in HEP.  Plan to focus on unlevel surface negotiation.  Patient to decrease to 1x/week with greater emphasis on long term, home exercise plan.   Pt will benefit from skilled therapeutic intervention in order to improve on the following deficits Abnormal gait;Decreased balance;Difficulty walking;Postural dysfunction;Decreased mobility   Rehab Potential Good   PT Frequency 2x / week   PT Duration 4 weeks   PT Treatment/Interventions Therapeutic activities;Functional mobility training;Neuromuscular re-education;Therapeutic exercise;Gait training;Stair training;Balance training;Patient/family education   PT Next Visit Plan Continue LE strength, stamina, general mobility training and outdoor gait activities.   Consulted and Agree with Plan of Care Patient        Problem List Patient Active Problem List   Diagnosis Date Noted  . Atrial flutter 10/03/2014  . Multiple falls 08/09/2014  . Chronic anticoagulation 06/30/2014  . Atrial fibrillation 03/03/2014  . Protein-calorie malnutrition, severe 02/03/2014  . Unspecified arthropathy, lower leg 01/29/2014  . Chronic airway obstruction, not elsewhere classified 01/29/2014  . Constipation 01/13/2014  . Obesity (BMI 30-39.9) 01/04/2014  . S/P left TKA 01/02/2014  . Chronic combined systolic and diastolic CHF (congestive heart failure) 10/11/2013  . Essential hypertension, benign 10/11/2013  . ASHD (arteriosclerotic heart disease) 07/09/2011  . Hyperlipidemia with target LDL less than 70 07/09/2011  . Sleep apnea 07/09/2011  . Alcohol abuse, daily use 07/09/2011  . Hypogonadism male 07/09/2011    Lacoya Wilbanks, PT 05/16/2015, 2:16 PM  [facility] [address] [place], [place], [address] Phone:  [phone]   Fax:  [phone]

## 2015-05-18 ENCOUNTER — Ambulatory Visit: Payer: Medicare Other | Admitting: Rehabilitative and Restorative Service Providers"

## 2015-05-23 ENCOUNTER — Ambulatory Visit: Payer: Medicare Other | Admitting: Rehabilitative and Restorative Service Providers"

## 2015-05-23 DIAGNOSIS — M6281 Muscle weakness (generalized): Secondary | ICD-10-CM

## 2015-05-23 DIAGNOSIS — R269 Unspecified abnormalities of gait and mobility: Secondary | ICD-10-CM

## 2015-05-23 NOTE — Therapy (Signed)
[facility] [address] [place], [place], [address] Phone: [phone]   Fax:  [phone]  Physical Therapy Treatment  Patient Details  Name: Aaron Berg MRN: [id number] Date of Birth: 1936-08-03 Referring Provider:  [name], MD  Encounter Date: 05/23/2015      PT End of Session - 05/23/15 1307    Visit Number 8   Number of Visits 16   Date for PT Re-Evaluation 06/15/15   Authorization Type G-code every 10th visit.   PT Start Time 1240   PT Stop Time 1320   PT Time Calculation (min) 40 min   Equipment Utilized During Treatment Gait belt   Activity Tolerance Patient tolerated treatment well   Behavior During Therapy WFL for tasks assessed/performed      Past Medical History  Diagnosis Date  . CAD (coronary artery disease) of bypass graft   . Dyslipidemia     well controlled  . Obesity   . Arthritis   . Male hypogonadism   . Gait disorder   . Hypertension   . Persistent atrial fibrillation 2003  . Sleep apnea     cpap  . CHF (congestive heart failure)   . Dry eyes   . Complication of anesthesia     "woke up too soon"  . Depression   . COPD (chronic obstructive pulmonary disease)     "no signs or tests"  . Headache(784.0)     silent migraines- no pain just go blind for just a minute  . Cancer     skin  . Memory loss, short term 2/15    per daughter since last surgery and placement at [facility].  "Hallucinations have stopped"  . Sleep disorder   . Multiple falls 08/09/2014  . Chronic systolic dysfunction of left ventricle   . Cerebrovascular disease 2006    multiple ischemic changes on prior MRI  . Sleep apnea     on CPAP  . Atrial flutter   . Hypertensive cardiovascular disease     Past Surgical History  Procedure Laterality Date  . Lumbar cyst      Dr. Joya Salm  . Shoulder surgery      Right with murphy, reattach ligaments  . Coronary artery bypass graft  2003    x 4  . Video  assisted thoracoscopy (vats)/thorocotomy  2002    benign nodule  . Joint replacement      right shoulder replaced  . Coronary angioplasty  1991    with stents placed  . Eye surgery Bilateral     cataract surgeries  . Eye surgery Right     detached retina  . Tonsillectomy  as child  . Appendectomy  as child  . Mohs surgery      on chest  . Total knee arthroplasty Right 01/02/2014    Procedure: RIGHT TOTAL KNEE ARTHROPLASTY;  Surgeon: Mauri Pole, MD;  [facility];  Service: Orthopedics;  Laterality: Right;  . Total knee arthroplasty Left 01/31/2014    Procedure: LEFT TOTAL KNEE ARTHROPLASTY;  Surgeon: Mauri Pole, MD;  [facility];  Service: Orthopedics;  Laterality: Left;  . Cardioversion N/A 04/26/2014    Procedure: CARDIOVERSION;  Surgeon: Sinclair Grooms, MD;  Location: [employer];  Service: Cardiovascular;  Laterality: N/A;  . Cardioversion N/A 05/24/2014    Procedure: CARDIOVERSION;  Surgeon: Sinclair Grooms, MD;  Location: [facility];  Service: Cardiovascular;  Laterality: N/A;    There were  no vitals filed for this visit.  Visit Diagnosis:  Abnormality of gait  Muscle weakness (generalized)   NEUROMUSCULAR RE-EDUCATION: Alternating LE foot taps to 6" step with bilateral UE support, decreasing to one UE support with min A Standing sidestepping R/L x 15 feet each direction with CGA to min A Standing backwards step returning to midline for weight shifting and postural control Marching x 100 ft with CGA to min A  THERAPEUTIC EXERCISE: Step ups R/L x 10 reps with one UE support Sit<>stand x 10 reps without UE support Sidestep and return middle with UE support and CGA for safety  Gait: Community gait on unlevel surfaces with SPC and SBA to CGA for safety x 300 ft Gait in the clinic with cues on heel strike with SPC and without SPC with CGA for safety x 400 ft x 2 times Backwards walking x 20 ft with CGA x 2 reps Gait with SPC x 200 ft focusing on L  heel strike       PT Long Term Goals - 05/16/15 1243    PT LONG TERM GOAL #1   Title Pt will be able to verbalize understanding of fall prevention strategies within home environment.   Baseline Met on 05/16/2015   Time 4   Period Weeks   Status Achieved   PT LONG TERM GOAL #2   Title The patient will improve Berg from 34/56 up to 40/56 to demo decreasing risk for fall.   Baseline Met on 05/16/2015 with patient improving to 45/56.   Time 4   Period Weeks   Status Achieved   PT LONG TERM GOAL #3   Title The patient will improve gait speed from 2.34 ft/sec up to 2.8 ft/sec to demo improving functional ambulation.   Baseline Patient gait speed 2.2 ft/sec at his "comfortable pace" with SPC modified indep   Time 4   Period Weeks   Status Not Met   PT LONG TERM GOAL #4   Title The patient will report no dizziness with bed mobility tasks.  New Target date 06/15/2015   Baseline IMproved, but not resolved.  Pt reports that he has occasional flashes of dizziness.   Time 4   Period Weeks   Status On-going   PT LONG TERM GOAL #5   Title The patient will negotiate unlevel surfaces with SPC modified indep to negotiate home environment safely.   Baseline New Target date 06/15/2015   Time 4   Period Weeks   Status On-going   Additional Long Term Goals   Additional Long Term Goals Yes   PT LONG TERM GOAL #6   Title The patient will return demo HEP progression for post d/c activities.   Baseline Target date 06/15/2015   Time 4   Period Weeks               Plan - 05/23/15 1331    Clinical Impression Statement The patient did not have loss of balance on unlevel surfaces today, however he had decreased L foot clearance during gait with multitasking in the clinic (small trip, but self recovered).  The patient fatigues easily with step ups and longer distance walking.  Encouraged continuing current HEP.    PT Next Visit Plan Continue LE strength, stamina, general mobility training and outdoor  gait activities.   Consulted and Agree with Plan of Care Patient        Problem List Patient Active Problem List   Diagnosis Date Noted  . Atrial flutter  10/03/2014  . Multiple falls 08/09/2014  . Chronic anticoagulation 06/30/2014  . Atrial fibrillation 03/03/2014  . Protein-calorie malnutrition, severe 02/03/2014  . Unspecified arthropathy, lower leg 01/29/2014  . Chronic airway obstruction, not elsewhere classified 01/29/2014  . Constipation 01/13/2014  . Obesity (BMI 30-39.9) 01/04/2014  . S/P left TKA 01/02/2014  . Chronic combined systolic and diastolic CHF (congestive heart failure) 10/11/2013  . Essential hypertension, benign 10/11/2013  . ASHD (arteriosclerotic heart disease) 07/09/2011  . Hyperlipidemia with target LDL less than 70 07/09/2011  . Sleep apnea 07/09/2011  . Alcohol abuse, daily use 07/09/2011  . Hypogonadism male 07/09/2011    Braiden Presutti, PT 05/23/2015, 1:37 PM  [facility] [address] [place], [place], [address] Phone: [phone]   Fax:  [phone]

## 2015-05-29 ENCOUNTER — Ambulatory Visit: Payer: Medicare Other | Admitting: Rehabilitative and Restorative Service Providers"

## 2015-05-29 DIAGNOSIS — R269 Unspecified abnormalities of gait and mobility: Secondary | ICD-10-CM | POA: Diagnosis not present

## 2015-05-29 DIAGNOSIS — M6281 Muscle weakness (generalized): Secondary | ICD-10-CM | POA: Diagnosis not present

## 2015-05-29 NOTE — Therapy (Signed)
[facility] [address] [place], [place], [address] Phone: [phone]   Fax:  [phone]  Physical Therapy Treatment  Patient Details  Name: Aaron Berg MRN: [id number] Date of Birth: 27-Sep-1936 Referring Provider:  [name], MD  Encounter Date: 05/29/2015      PT End of Session - 05/29/15 1306    Visit Number 9   Number of Visits 16   Date for PT Re-Evaluation 06/15/15   Authorization Type G-code every 10th visit.   PT Start Time 1235   PT Stop Time 1318   PT Time Calculation (min) 43 min   Equipment Utilized During Treatment Gait belt   Activity Tolerance Patient tolerated treatment well   Behavior During Therapy WFL for tasks assessed/performed      Past Medical History  Diagnosis Date  . CAD (coronary artery disease) of bypass graft   . Dyslipidemia     well controlled  . Obesity   . Arthritis   . Male hypogonadism   . Gait disorder   . Hypertension   . Persistent atrial fibrillation 2003  . Sleep apnea     cpap  . CHF (congestive heart failure)   . Dry eyes   . Complication of anesthesia     "woke up too soon"  . Depression   . COPD (chronic obstructive pulmonary disease)     "no signs or tests"  . Headache(784.0)     silent migraines- no pain just go blind for just a minute  . Cancer     skin  . Memory loss, short term 2/15    per daughter since last surgery and placement at [facility].  "Hallucinations have stopped"  . Sleep disorder   . Multiple falls 08/09/2014  . Chronic systolic dysfunction of left ventricle   . Cerebrovascular disease 2006    multiple ischemic changes on prior MRI  . Sleep apnea     on CPAP  . Atrial flutter   . Hypertensive cardiovascular disease     Past Surgical History  Procedure Laterality Date  . Lumbar cyst      Dr. Joya Salm  . Shoulder surgery      Right with murphy, reattach ligaments  . Coronary artery bypass graft  2003    x 4  . Video  assisted thoracoscopy (vats)/thorocotomy  2002    benign nodule  . Joint replacement      right shoulder replaced  . Coronary angioplasty  1991    with stents placed  . Eye surgery Bilateral     cataract surgeries  . Eye surgery Right     detached retina  . Tonsillectomy  as child  . Appendectomy  as child  . Mohs surgery      on chest  . Total knee arthroplasty Right 01/02/2014    Procedure: RIGHT TOTAL KNEE ARTHROPLASTY;  Surgeon: Mauri Pole, MD;  [facility];  Service: Orthopedics;  Laterality: Right;  . Total knee arthroplasty Left 01/31/2014    Procedure: LEFT TOTAL KNEE ARTHROPLASTY;  Surgeon: Mauri Pole, MD;  [facility];  Service: Orthopedics;  Laterality: Left;  . Cardioversion N/A 04/26/2014    Procedure: CARDIOVERSION;  Surgeon: Sinclair Grooms, MD;  Location: [facility];  Service: Cardiovascular;  Laterality: N/A;  . Cardioversion N/A 05/24/2014    Procedure: CARDIOVERSION;  Surgeon: Sinclair Grooms, MD;  Location: [facility];  Service: Cardiovascular;  Laterality: N/A;    There were  no vitals filed for this visit.  Visit Diagnosis:  Abnormality of gait  Muscle weakness (generalized)      Subjective Assessment - 05/29/15 1237    Subjective The patient reports that he has been getting more sleep.  He notes that overall he is improving with balance.     Patient Stated Goals Pt reports that he would like to work on stamina and lower extremity strength   Currently in Pain? No/denies      Gait: Ambulation on community surfaces including grass, mulch with SPC modified indep including curbs x 500 ft. Treadmill walking x 5 minutes with bilateral UE support up to 1.2 mph and up to 2% incline for improved endurance/stamina with cues on bilateral heel strike and longer stride emphasis.  BP=140/86   NEUROMUSCULAR RE-EDUCATION: Step-ups x R and L sides x 10 reps with one UE support and CGA Alternating LE foot taps to 6" and 12' steps with min A due  to loss of balance.  Sit<>sidelying did not elicit nystagmus today, however patient c/o vague sensation of dizziness, but denies true spinning *PT to further assess as indicated Sit<>stand x 10 reps        PT Long Term Goals - 05/16/15 1243    PT LONG TERM GOAL #1   Title Pt will be able to verbalize understanding of fall prevention strategies within home environment.   Baseline Met on 05/16/2015   Time 4   Period Weeks   Status Achieved   PT LONG TERM GOAL #2   Title The patient will improve Berg from 34/56 up to 40/56 to demo decreasing risk for fall.   Baseline Met on 05/16/2015 with patient improving to 45/56.   Time 4   Period Weeks   Status Achieved   PT LONG TERM GOAL #3   Title The patient will improve gait speed from 2.34 ft/sec up to 2.8 ft/sec to demo improving functional ambulation.   Baseline Patient gait speed 2.2 ft/sec at his "comfortable pace" with SPC modified indep   Time 4   Period Weeks   Status Not Met   PT LONG TERM GOAL #4   Title The patient will report no dizziness with bed mobility tasks.  New Target date 06/15/2015   Baseline IMproved, but not resolved.  Pt reports that he has occasional flashes of dizziness.   Time 4   Period Weeks   Status On-going   PT LONG TERM GOAL #5   Title The patient will negotiate unlevel surfaces with SPC modified indep to negotiate home environment safely.   Baseline New Target date 06/15/2015   Time 4   Period Weeks   Status On-going   Additional Long Term Goals   Additional Long Term Goals Yes   PT LONG TERM GOAL #6   Title The patient will return demo HEP progression for post d/c activities.   Baseline Target date 06/15/2015   Time 4   Period Weeks               Plan - 05/29/15 1431    Clinical Impression Statement The patient reports vague sensation of dizziness, but not provoked today with sidelying tests.  PT recommending d/c in next 1-2 visits with HEP.  May reassess further for vertigo depending on patient  reports/complaints.   PT Next Visit Plan Continue LE strength, stamina, general mobility training and outdoor gait activities.   Consulted and Agree with Plan of Care Patient  Problem List Patient Active Problem List   Diagnosis Date Noted  . Atrial flutter 10/03/2014  . Multiple falls 08/09/2014  . Chronic anticoagulation 06/30/2014  . Atrial fibrillation 03/03/2014  . Protein-calorie malnutrition, severe 02/03/2014  . Unspecified arthropathy, lower leg 01/29/2014  . Chronic airway obstruction, not elsewhere classified 01/29/2014  . Constipation 01/13/2014  . Obesity (BMI 30-39.9) 01/04/2014  . S/P left TKA 01/02/2014  . Chronic combined systolic and diastolic CHF (congestive heart failure) 10/11/2013  . Essential hypertension, benign 10/11/2013  . ASHD (arteriosclerotic heart disease) 07/09/2011  . Hyperlipidemia with target LDL less than 70 07/09/2011  . Sleep apnea 07/09/2011  . Alcohol abuse, daily use 07/09/2011  . Hypogonadism male 07/09/2011    Brentyn Seehafer, PT 05/29/2015, 2:32 PM  [facility] [address] [place], [place], [address] Phone: [phone]   Fax:  [phone]

## 2015-06-01 ENCOUNTER — Other Ambulatory Visit: Payer: Self-pay | Admitting: Family Medicine

## 2015-06-06 ENCOUNTER — Ambulatory Visit: Payer: Medicare Other | Admitting: Rehabilitative and Restorative Service Providers"

## 2015-06-15 ENCOUNTER — Ambulatory Visit: Payer: Medicare Other | Attending: Family Medicine | Admitting: Rehabilitative and Restorative Service Providers"

## 2015-06-15 ENCOUNTER — Encounter: Payer: Self-pay | Admitting: Rehabilitative and Restorative Service Providers"

## 2015-06-15 DIAGNOSIS — R269 Unspecified abnormalities of gait and mobility: Secondary | ICD-10-CM | POA: Diagnosis not present

## 2015-06-15 NOTE — Therapy (Signed)
Aaron Berg [address] [place], [place], [address] Phone: [phone]   Fax:  [phone]  Physical Therapy Treatment  Patient Details  Name: Aaron Berg MRN: [id number] Date of Birth: 1935-12-27 Referring Provider:  [name], MD  Encounter Date: 06/15/2015      PT End of Session - 06/15/15 1533    Visit Number 10   Number of Visits 16   Date for PT Re-Evaluation 06/15/15   Authorization Type G-code every 10th visit.   PT Start Time 1325   PT Stop Time 1405   PT Time Calculation (min) 40 min   Equipment Utilized During Treatment Gait belt   Activity Tolerance Patient tolerated treatment well   Behavior During Therapy WFL for tasks assessed/performed      Past Medical History  Diagnosis Date  . CAD (coronary artery disease) of bypass graft   . Dyslipidemia     well controlled  . Obesity   . Arthritis   . Male hypogonadism   . Gait disorder   . Hypertension   . Persistent atrial fibrillation 2003  . Sleep apnea     cpap  . CHF (congestive heart failure)   . Dry eyes   . Complication of anesthesia     "woke up too soon"  . Depression   . COPD (chronic obstructive pulmonary disease)     "no signs or tests"  . Headache(784.0)     silent migraines- no pain just go blind for just a minute  . Cancer     skin  . Memory loss, short term 2/15    per daughter since last surgery and placement at [facility].  "Hallucinations have stopped"  . Sleep disorder   . Multiple falls 08/09/2014  . Chronic systolic dysfunction of left ventricle   . Cerebrovascular disease 2006    multiple ischemic changes on prior MRI  . Sleep apnea     on CPAP  . Atrial flutter   . Hypertensive cardiovascular disease     Past Surgical History  Procedure Laterality Date  . Lumbar cyst      Dr. Joya Salm  . Shoulder surgery      Right with murphy, reattach ligaments  . Coronary artery bypass graft  2003    x 4  . Video  assisted thoracoscopy (vats)/thorocotomy  2002    benign nodule  . Joint replacement      right shoulder replaced  . Coronary angioplasty  1991    with stents placed  . Eye surgery Bilateral     cataract surgeries  . Eye surgery Right     detached retina  . Tonsillectomy  as child  . Appendectomy  as child  . Mohs surgery      on chest  . Total knee arthroplasty Right 01/02/2014    Procedure: RIGHT TOTAL KNEE ARTHROPLASTY;  Surgeon: Mauri Pole, MD;  Location: [facility];  Service: Orthopedics;  Laterality: Right;  . Total knee arthroplasty Left 01/31/2014    Procedure: LEFT TOTAL KNEE ARTHROPLASTY;  Surgeon: Mauri Pole, MD;  Location: [facility];  Service: Orthopedics;  Laterality: Left;  . Cardioversion N/A 04/26/2014    Procedure: CARDIOVERSION;  Surgeon: Sinclair Grooms, MD;  Location: [place];  Service: Cardiovascular;  Laterality: N/A;  . Cardioversion N/A 05/24/2014    Procedure: CARDIOVERSION;  Surgeon: Sinclair Grooms, MD;  Location: [place];  Service: Cardiovascular;  Laterality: N/A;    There were  no vitals filed for this visit.  Visit Diagnosis:  Abnormality of gait      Subjective Assessment - 06/15/15 1329    Subjective Patient reports that he missed last week because he was overheated from working on car.  No recent falls.  Patient doing HEP regularly. Patient reports no dizziness with rolling, that "is pretty much gone".  He notes improvement with dizziness, but up and down with balance and mobility.   Currently in Pain? No/denies      Gait: Ambulation outdoor surfaces level/ and unlevel with SPC modified independently at slowed pace.  Patient negotiated rubber mulch, gravel, pine straw, grassy surfaces, curbs, and sidewalks x 300 ft.  NEUROMUSCULAR RE-EDUCATION: Reviewed all prior HEP (sit<>stand, heel raises, toe raises, standing on pillow for balance, seated ankle DF/PF, 1/2 tandem stance)  SELF CARE/HOME MANAGEMENT: Discussed continued  performance of HEP, reviewed home safety and discussed importance of "forever exercise" to maintain gains        PT Education - 06/15/15 1531    Education provided Yes   Education Details Discharged today discussing long term plan for exercise and fall prevention.   Person(s) Educated Patient   Methods Explanation   Comprehension Verbalized understanding           PT Long Term Goals - 06/15/15 1332    PT LONG TERM GOAL #1   Title Pt will be able to verbalize understanding of fall prevention strategies within home environment.   Baseline Met on 05/16/2015   Time 4   Period Weeks   Status Achieved   PT LONG TERM GOAL #2   Title The patient will improve Berg from 34/56 up to 40/56 to demo decreasing risk for fall.   Baseline Met on 05/16/2015 with patient improving to 45/56.   Time 4   Period Weeks   Status Achieved   PT LONG TERM GOAL #3   Title The patient will improve gait speed from 2.34 ft/sec up to 2.8 ft/sec to demo improving functional ambulation.   Baseline Patient gait speed 2.2 ft/sec at his "comfortable pace" with SPC modified indep   Time 4   Period Weeks   Status Not Met   PT LONG TERM GOAL #4   Title The patient will report no dizziness with bed mobility tasks.  New Target date 06/15/2015   Baseline Met on 06/15/2015   Time 4   Period Weeks   Status Achieved   PT LONG TERM GOAL #5   Title The patient will negotiate unlevel surfaces with SPC modified indep to negotiate home environment safely.   Baseline Met on 06/15/2015   Time 4   Period Weeks   Status Achieved   PT LONG TERM GOAL #6   Title The patient will return demo HEP progression for post d/c activities.   Baseline Target date 06/15/2015   Time 4   Period Weeks   Status Achieved               Plan - 06/15/15 1534    Clinical Impression Statement The patient met LTGs and has HEP to continue.  He c/o variability wiht balance and PT discussed chronic nature of some of the factors impacting balance  including: peripheral neuropathy, h/o intermittent vertigo.  PT recommended patient use strategies to improve household safety and compensatory strategies including flash lights in dark, etc to help maintain safe mobility.   PT Next Visit Plan Discharge from PT   Consulted and Agree with Plan of Care   Patient          G-Codes - 06/15/15 1541    Functional Assessment Tool Used Berg=40/56   Functional Limitation Mobility: Walking and moving around   Mobility: Walking and Moving Around Goal Status (G8979) At least 20 percent but less than 40 percent impaired, limited or restricted   Mobility: Walking and Moving Around Discharge Status (G8980) At least 20 percent but less than 40 percent impaired, limited or restricted      Problem List Patient Active Problem List   Diagnosis Date Noted  . Atrial flutter 10/03/2014  . Multiple falls 08/09/2014  . Chronic anticoagulation 06/30/2014  . Atrial fibrillation 03/03/2014  . Protein-calorie malnutrition, severe 02/03/2014  . Unspecified arthropathy, lower leg 01/29/2014  . Chronic airway obstruction, not elsewhere classified 01/29/2014  . Constipation 01/13/2014  . Obesity (BMI 30-39.9) 01/04/2014  . S/P left TKA 01/02/2014  . Chronic combined systolic and diastolic CHF (congestive heart failure) 10/11/2013  . Essential hypertension, benign 10/11/2013  . ASHD (arteriosclerotic heart disease) 07/09/2011  . Hyperlipidemia with target LDL less than 70 07/09/2011  . Sleep apnea 07/09/2011  . Alcohol abuse, daily use 07/09/2011  . Hypogonadism male 07/09/2011    ,, PT 06/15/2015, 3:42 PM  [facility] [address] [place], [place], [address] Phone: [phone]   Fax:  [phone]      

## 2015-06-15 NOTE — Therapy (Signed)
[facility] [address] [place], [place], [address] Phone: [phone]   Fax:  [phone]  Patient Details  Name: Aaron Berg MRN: [id number] Date of Birth: 1936/05/28 Referring Provider:  No ref. provider found  Encounter Date: 06/15/2015  PHYSICAL THERAPY DISCHARGE SUMMARY  Visits from Start of Care: 10  Current functional level related to goals / functional outcomes:     PT Long Term Goals - 06/15/15 1332    PT LONG TERM GOAL #1   Title Pt will be able to verbalize understanding of fall prevention strategies within home environment.   Baseline Met on 05/16/2015   Time 4   Period Weeks   Status Achieved   PT LONG TERM GOAL #2   Title The patient will improve Berg from 34/56 up to 40/56 to demo decreasing risk for fall.   Baseline Met on 05/16/2015 with patient improving to 45/56.   Time 4   Period Weeks   Status Achieved   PT LONG TERM GOAL #3   Title The patient will improve gait speed from 2.34 ft/sec up to 2.8 ft/sec to demo improving functional ambulation.   Baseline Patient gait speed 2.2 ft/sec at his "comfortable pace" with SPC modified indep   Time 4   Period Weeks   Status Not Met   PT LONG TERM GOAL #4   Title The patient will report no dizziness with bed mobility tasks.  New Target date 06/15/2015   Baseline Met on 06/15/2015   Time 4   Period Weeks   Status Achieved   PT LONG TERM GOAL #5   Title The patient will negotiate unlevel surfaces with SPC modified indep to negotiate home environment safely.   Baseline Met on 06/15/2015   Time 4   Period Weeks   Status Achieved   PT LONG TERM GOAL #6   Title The patient will return demo HEP progression for post d/c activities.   Baseline Target date 06/15/2015   Time 4   Period Weeks   Status Achieved        Remaining deficits: Intermittent/variable balance performance from day to day Decreased high level balance Continued risk for falls    Education / Equipment: HEP, home fall prevention.  Plan: Patient agrees to discharge.  Patient goals were met. Patient is being discharged due to meeting the stated rehab goals.  ?????       Leane Loring , PT  06/15/2015, 3:44 PM  [facility] [address] [place], [place], [address] Phone: [phone]   Fax:  [phone]

## 2015-06-21 ENCOUNTER — Encounter: Payer: PRIVATE HEALTH INSURANCE | Admitting: Rehabilitative and Restorative Service Providers"

## 2015-06-29 ENCOUNTER — Encounter: Payer: PRIVATE HEALTH INSURANCE | Admitting: Rehabilitative and Restorative Service Providers"

## 2015-07-27 ENCOUNTER — Other Ambulatory Visit: Payer: Self-pay | Admitting: *Deleted

## 2015-07-27 MED ORDER — APIXABAN 5 MG PO TABS: 5.0000 mg | ORAL_TABLET | Freq: Two times a day (BID) | ORAL | Status: DC

## 2015-07-27 NOTE — Telephone Encounter (Signed)
Patient requests a local supply to get him through until his mail order arrives.

## 2015-08-14 DIAGNOSIS — T1490 Injury, unspecified: Secondary | ICD-10-CM | POA: Diagnosis not present

## 2015-08-28 ENCOUNTER — Other Ambulatory Visit: Payer: Self-pay | Admitting: Family Medicine

## 2015-08-28 NOTE — Telephone Encounter (Signed)
IS THIS OKAY TO REFILL 

## 2016-01-09 ENCOUNTER — Encounter: Payer: Self-pay | Admitting: Family Medicine

## 2016-01-09 ENCOUNTER — Telehealth: Payer: Self-pay | Admitting: *Deleted

## 2016-01-09 ENCOUNTER — Ambulatory Visit (INDEPENDENT_AMBULATORY_CARE_PROVIDER_SITE_OTHER): Payer: Medicare Other | Admitting: Family Medicine

## 2016-01-09 VITALS — BP 124/80 | HR 77 | Wt 236.0 lb

## 2016-01-09 DIAGNOSIS — R42 Dizziness and giddiness: Secondary | ICD-10-CM | POA: Diagnosis not present

## 2016-01-09 DIAGNOSIS — Z23 Encounter for immunization: Secondary | ICD-10-CM | POA: Diagnosis not present

## 2016-01-09 DIAGNOSIS — R296 Repeated falls: Secondary | ICD-10-CM

## 2016-01-09 DIAGNOSIS — G4730 Sleep apnea, unspecified: Secondary | ICD-10-CM | POA: Diagnosis not present

## 2016-01-09 DIAGNOSIS — E291 Testicular hypofunction: Secondary | ICD-10-CM | POA: Diagnosis not present

## 2016-01-09 DIAGNOSIS — F329 Major depressive disorder, single episode, unspecified: Secondary | ICD-10-CM | POA: Diagnosis not present

## 2016-01-09 DIAGNOSIS — I2510 Atherosclerotic heart disease of native coronary artery without angina pectoris: Secondary | ICD-10-CM | POA: Diagnosis not present

## 2016-01-09 DIAGNOSIS — Z9181 History of falling: Secondary | ICD-10-CM | POA: Diagnosis not present

## 2016-01-09 DIAGNOSIS — R5382 Chronic fatigue, unspecified: Secondary | ICD-10-CM | POA: Diagnosis not present

## 2016-01-09 DIAGNOSIS — E785 Hyperlipidemia, unspecified: Secondary | ICD-10-CM

## 2016-01-09 DIAGNOSIS — E669 Obesity, unspecified: Secondary | ICD-10-CM | POA: Diagnosis not present

## 2016-01-09 DIAGNOSIS — F32A Depression, unspecified: Secondary | ICD-10-CM

## 2016-01-09 DIAGNOSIS — I10 Essential (primary) hypertension: Secondary | ICD-10-CM | POA: Diagnosis not present

## 2016-01-09 LAB — CBC WITH DIFFERENTIAL/PLATELET
Basophils Absolute: 0 10*3/uL (ref 0.0–0.1)
Basophils Relative: 0 % (ref 0–1)
EOS ABS: 0.2 10*3/uL (ref 0.0–0.7)
Eosinophils Relative: 2 % (ref 0–5)
HEMATOCRIT: 44.6 % (ref 39.0–52.0)
HEMOGLOBIN: 14.6 g/dL (ref 13.0–17.0)
LYMPHS ABS: 1.2 10*3/uL (ref 0.7–4.0)
Lymphocytes Relative: 15 % (ref 12–46)
MCH: 31.6 pg (ref 26.0–34.0)
MCHC: 32.7 g/dL (ref 30.0–36.0)
MCV: 96.5 fL (ref 78.0–100.0)
MONOS PCT: 8 % (ref 3–12)
MPV: 11.1 fL (ref 8.6–12.4)
Monocytes Absolute: 0.7 10*3/uL (ref 0.1–1.0)
NEUTROS PCT: 75 % (ref 43–77)
Neutro Abs: 6.2 10*3/uL (ref 1.7–7.7)
Platelets: 122 10*3/uL — ABNORMAL LOW (ref 150–400)
RBC: 4.62 MIL/uL (ref 4.22–5.81)
RDW: 14.7 % (ref 11.5–15.5)
WBC: 8.2 10*3/uL (ref 4.0–10.5)

## 2016-01-09 LAB — COMPREHENSIVE METABOLIC PANEL
ALBUMIN: 4 g/dL (ref 3.6–5.1)
ALT: 20 U/L (ref 9–46)
AST: 31 U/L (ref 10–35)
Alkaline Phosphatase: 61 U/L (ref 40–115)
BUN: 16 mg/dL (ref 7–25)
CALCIUM: 9.6 mg/dL (ref 8.6–10.3)
CHLORIDE: 103 mmol/L (ref 98–110)
CO2: 21 mmol/L (ref 20–31)
Creat: 0.86 mg/dL (ref 0.70–1.18)
Glucose, Bld: 68 mg/dL (ref 65–99)
POTASSIUM: 4.1 mmol/L (ref 3.5–5.3)
Sodium: 142 mmol/L (ref 135–146)
TOTAL PROTEIN: 6.9 g/dL (ref 6.1–8.1)
Total Bilirubin: 1 mg/dL (ref 0.2–1.2)

## 2016-01-09 LAB — LIPID PANEL
CHOL/HDL RATIO: 3.4 ratio (ref <=5.0)
CHOLESTEROL: 191 mg/dL (ref 125–200)
HDL: 56 mg/dL (ref >=40)
LDL Cholesterol: 85 mg/dL (ref <130)
TRIGLYCERIDES: 251 mg/dL — AB (ref <150)
VLDL: 50 mg/dL — AB (ref <30)

## 2016-01-09 LAB — TSH: TSH: 1.501 u[IU]/mL (ref 0.350–4.500)

## 2016-01-09 MED ORDER — BUPROPION HCL ER (SR) 100 MG PO TB12: 100.0000 mg | ORAL_TABLET | Freq: Two times a day (BID) | ORAL | Status: DC

## 2016-01-09 NOTE — Telephone Encounter (Signed)
Called [facility] for [place] Spoke with Lattie Haw

## 2016-01-09 NOTE — Progress Notes (Signed)
   Subjective:    Patient ID: Aaron Berg, male    DOB: [birth year], 80 y.o.   MRN: [id number]  HPI He is here for consult concerning multiple issues. Since he had right knee surgery 2 years ago he has had difficulty with intermittent with balance. When he closes his eyes he feels as if he could fall. He has fallen on several occasions. He cannot relate this to any head position. He has previous history of difficulty with dizziness and was evaluated by Dr. Morene Antu.Parent with no definitive diagnosis was made. He has a history of sleep apnea but has not had a read out in quite some time. He continues on medications listed in the chart. He does have a history of hypogonadism and states he is using the medication. He has underlying heart disease but has not had any chest pain, shortness of breath. He states that he sleeps up to 12 hours per day, feels quite depressed as well as having difficulty with anhedonia. Further depression related questioning was definitely positive.His daughter who is in nurse's here with him. Apparently he is looking at assisted living as well.   Review of Systems     Objective:   Physical Exam Alert and in no distress with a slightly flat affect but appropriately dressed.His medications and health maintenance was reviewed.       Assessment & Plan:  Sleep apnea  ASHD (arteriosclerotic heart disease) - Plan: CBC with Differential/Platelet, Comprehensive metabolic panel, Lipid panel  Dizziness  Hypogonadism male - Plan: Testosterone  Hyperlipidemia with target LDL less than 70 - Plan: Lipid panel  Essential hypertension, benign - Plan: CBC with Differential/Platelet, Comprehensive metabolic panel  Multiple falls - Plan: CBC with Differential/Platelet, Comprehensive metabolic panel  Obesity (BMI 30-39.9) - Plan: CBC with Differential/Platelet, Comprehensive metabolic panel, Lipid panel  Need for prophylactic vaccination against Streptococcus pneumoniae  (pneumococcus) - Plan: Pneumococcal conjugate vaccine 13-valent  Need for prophylactic vaccination and inoculation against influenza - Plan: Flu vaccine HIGH DOSE PF (Fluzone High dose)  Depression - Plan: buPROPion (WELLBUTRIN SR) 100 MG 12 hr tablet  Chronic fatigue - Plan: TSH I discussed the dizziness with him in essentially explained that it is probably multi factorial.Strongly encouraged him to leave a night light on in bedroom, hallway and bathroom. We will have home health assessment done. He will also need to get a read out on his CPAP. We will also get routine blood screening on him. I added Wellbutrin to his regimen. Recheck here in one month. Over 25 minutes, greater than 50% spent in counseling and coordination of care.

## 2016-01-10 LAB — TESTOSTERONE: Testosterone: 832 ng/dL — ABNORMAL HIGH (ref 250–827)

## 2016-01-13 ENCOUNTER — Other Ambulatory Visit: Payer: Self-pay | Admitting: Interventional Cardiology

## 2016-01-14 ENCOUNTER — Telehealth: Payer: Self-pay | Admitting: Family Medicine

## 2016-01-14 DIAGNOSIS — F329 Major depressive disorder, single episode, unspecified: Secondary | ICD-10-CM

## 2016-01-14 DIAGNOSIS — F32A Depression, unspecified: Secondary | ICD-10-CM

## 2016-01-14 MED ORDER — BUPROPION HCL ER (SR) 100 MG PO TB12: 100.0000 mg | ORAL_TABLET | Freq: Two times a day (BID) | ORAL | Status: DC

## 2016-01-14 MED ORDER — APIXABAN 5 MG PO TABS: 5.0000 mg | ORAL_TABLET | Freq: Two times a day (BID) | ORAL | Status: DC

## 2016-01-14 NOTE — Telephone Encounter (Signed)
Pt called and was wondering why he only got 30 tablets a 15 day supply instead of a month supply if he was suppose to take 2 pills a days, said he you could fix that would be great pt uses [facility] and can be reached at [phone]

## 2016-01-14 NOTE — Telephone Encounter (Signed)
Let him know that that was my mistake and I corrected it

## 2016-01-14 NOTE — Telephone Encounter (Signed)
Pt's Rx was sent to pt's pharmacy as requested. Confirmation received.  °

## 2016-01-15 ENCOUNTER — Other Ambulatory Visit: Payer: Self-pay

## 2016-01-15 DIAGNOSIS — I5042 Chronic combined systolic (congestive) and diastolic (congestive) heart failure: Secondary | ICD-10-CM

## 2016-01-15 DIAGNOSIS — Z7689 Persons encountering health services in other specified circumstances: Secondary | ICD-10-CM

## 2016-01-15 NOTE — Telephone Encounter (Signed)
Spoke with patient & informed that it was a mistake  & that Dr Redmond School corrected it.

## 2016-01-15 NOTE — Patient Outreach (Signed)
[facility]) Care Management  01/15/2016  Aaron Berg 03-11-1936 [id number]   SUBJECTIVE:  Telephone call to patient regarding primary MD referral. HIPAA verified with patient.  Discussed and offered [facility] care management services to patient.  Patient agreed to telephonic follow up. Patient refused community care management. Patient stated he needed time to clean up his RV. 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 Patient states his legal residence is in [place]. Patient states his is living in an RV.   CONGESTIVE HEART FAILURE:  Patient states he was diagnosed with heart failure approximately 6 months to 1 year ago. Patient states he is not exactly sure of the time. Patient states about 10 years ago he had open heart surgery after having a cardiac cath.  Patient states he has had several cardiac caths done. Patient states he has had PVC's and heart murmur since he was a teenager. Patient  States he is unsure what heart failure means.  States he would like additional information regarding heart failure. Patient states he a has a workable scale at home. Patient denies any treatment or home monitoring related to his heart failure. Patient states he is not aware of any symptoms related to heart failure. Patient states he has also been diagnosed with COPD. Patient states he is not aware of any symptoms related to COPD. Patient states he has sleep apnea and uses a CPAP.   FALLS:  Patient states he has balance issues that come and go. Patient states he has learned over time to manage it. Patient states he has dizziness at times.  States he is helpless in the dark because he would fall. Patient states he does not go out at night for that reason. Patient reports last fall was Fall 2015. Patient denies any falls in 2016. Patient denies any serious injuries related to falls. Patient states he has had balance issues in the past especially after having surgeries. Patient  states he does not do well with general anesthesia. Patient states he walks with a cane. Patient states he lost strength in his arms and legs after his second knee surgery about 2 years ago. Patient states he also has a history of benign positional vertigo.  Patient denies follow up with [facility] community case manager.   Psychosocial: Patient states he is depressed. Patient reports he has little interest in doing anything other than watching TV and lacks energy. Patient states he saw his primary MD on 01/09/16 for a follow up. Patient states he discussed his depression with his primary MD. States his doctor gave him a prescription for Wellbutrin. Patient states the doctor only wrote him a prescription for 15 days. Patient states he has contacted his primary MD office and they have called in a 30 day supply of wellbutrin. Patient states he will not start taking the wellbutrin until all of his medication is in .  Patient states he wants to live in senior living housing. Patient states he has submitted an application and started the process for senior living.   MEDICATIONS: Patient states has has recently been giving a prescription for Wellbutrin. Patient states he is concerned about some of the side effects he has read. Patient states he is on approximately 5 medications. Patient states he is unsure what his medications are for.  Patient denies any recent hospitalizations or emergency room visits within the past year.   ASSESSMENT; Primary MD referral. Patient last seen by primary MD on 01/09/16. Depression screening noted  in EPIC done on 01/09/16.  Patients PHQ2 - 6 and PHQ9 - 17. Per EPIC NOTE primary MD on 01/09/16: 80 y.o. male who presents for chronic combined systolic and diastolic heart failure, coronary artery disease with prior bypass grafting in 2004, chronic anticoagulation therapy, history of paroxysmal atrial fibrillation, and hypertension. Patient would benefit from health coach follow up for heart  failure education. Patient refuses community case management.  Patient would benefit from falls prevention education material  Patient would benefit from follow up with pharmacy for medication education.   PLAN: RNCM will refer patient to health coach and pharmacy.   Quinn Plowman RN,BSN,CCM [facility] Telephonic  [phone]

## 2016-01-17 ENCOUNTER — Telehealth: Payer: Self-pay | Admitting: Family Medicine

## 2016-01-17 NOTE — Telephone Encounter (Signed)
Dtr called about Handicap Placard that you'll discussed at last office visit. Was this completed?  Aaron Berg t# [phone] Please advise

## 2016-01-18 NOTE — Telephone Encounter (Signed)
Pt called and informed placard ready

## 2016-01-21 ENCOUNTER — Other Ambulatory Visit: Payer: Self-pay | Admitting: *Deleted

## 2016-01-21 NOTE — Telephone Encounter (Signed)
apixaban (ELIQUIS) 5 MG TABS tablet  Medication   Date: 01/14/2016  Department: [facility] [place] Office  Ordering/Authorizing: Belva Crome, MD      Order Providers    Prescribing Provider Encounter Provider   Belva Crome, MD Belva Crome, MD    Medication Detail      Disp Refills Start End     apixaban (ELIQUIS) 5 MG TABS tablet 180 tablet 0 01/14/2016     Sig - Route: Take 1 tablet (5 mg total) by mouth 2 (two) times daily. - Oral    Notes to Pharmacy: Please call our office to schedule an yearly appointment for future refills. [phone]. Thank you 1st attempt    E-Prescribing Status: Receipt confirmed by pharmacy (01/14/2016 8:17 AM EST)     Pharmacy    [facility]     Medication filled till April when pt needs an OV

## 2016-01-24 ENCOUNTER — Other Ambulatory Visit: Payer: Self-pay

## 2016-01-24 NOTE — Patient Outreach (Signed)
[facility]) Care Management  01/24/2016  Aaron Berg November 28, 1936 [id number]  Unsuccessful attempt to reach patient referred for CHF disease management.  HIPPA appropriate message left requesting call back.  RN will make another attempt if no call back received.  [name], RN, MSN [place] [phone] Fax [phone]

## 2016-01-30 ENCOUNTER — Other Ambulatory Visit: Payer: Self-pay | Admitting: Pharmacist

## 2016-01-30 NOTE — Patient Outreach (Signed)
Aaron Berg is a 80 y.o. male referred to pharmacy to address his medication questions, particularly regarding a medication that he was just started on, Wellbutrin.   Patient reports that he no longer has any questions regarding the Wellbutrin. Reports that he spoke to his doctor about it, as well as his daughter who is a Marine scientist. Reports that he has now been taking it for a couple of weeks and has not noticed any side effects.  While on the phone with the patient, offer to go over his other medications with him to discuss what each is for. Patient states that he would appreciate this. Note that patient reports that he is not currently taking the potassium or the naproxen. Talk with Aaron Berg about the increased risk of bleeding with taking medications like naproxen or ibuprofen while he is on Eliquis.Patient verbalizes understanding. Counsel him to speak with his Cardiologist in the future before he takes one of these medications. Review with Aaron Berg signs/symptoms of bleeding.  Patient reports that he is currently able to afford all of his medications. Reports that that his Eliquis is currently expensive. Review details of patient's coverage based on the Medicare.gov website. Find that patient has a large deductible for the year, just over $1000, left to pay at this time. Patient states that he will be able to continue to afford his medications as he works through this deductible.  Patient reports that he has had two falls recently. Patient reports that these have been due to his knee. Reports that he has had knee surgery followed by rehab and that he has had physical therapy to improve his strength. However, reports that he still has some issues with the joint. Denies any dizziness or lightheadedness related to these falls. Patient reports that he was not injured. Patient reports that he does have a cane, but that he does not always use it. Counsel patient about the importance of keeping this cane  with him at all times. Also advise patient to follow up with his physician about his recent falls. Patient verbalizes understanding and states that he will call to follow up with Dr. Redmond School today.  Patient reports that his last blood pressure was 115/75, heart rate of 65.  Discuss with patient his testosterone therapy. Discuss with patient risks versus benefits of this therapy. Patient reports that his levels are closely monitored by his physician. Reports that he is being treated both for symptoms and for the level being low and that his dose is adjusted based on his level.  Patient states that he has no further questions for me at this time. Provide patient with my phone number as well as the main office phone number. Also let patient know that Glen [facility]in has been trying to reach the patient. Provide patient with Connie's phone number.  PLAN:  1) Patient to follow up with his PCP regarding recent falls.  2) Will coordinate care with Nurse Health Coach.  3) Will close pharmacy episode at this time.  Harlow Asa, PharmD Clinical Pharmacist [facility] [phone]

## 2016-01-31 ENCOUNTER — Other Ambulatory Visit: Payer: Self-pay

## 2016-01-31 VITALS — Wt 225.0 lb

## 2016-01-31 DIAGNOSIS — I5042 Chronic combined systolic (congestive) and diastolic (congestive) heart failure: Secondary | ICD-10-CM

## 2016-02-01 ENCOUNTER — Telehealth: Payer: Self-pay

## 2016-02-01 NOTE — Telephone Encounter (Signed)
Left message for patient to call office to inform him of his normal CPAP results.

## 2016-02-01 NOTE — Patient Outreach (Signed)
[facility]) Care Management  [place]  02/01/2016   Aaron Berg 1936-02-27 [id number]  Initial assessment with patient referred for CHF education.  Patient reports he has little knowledge of CHF self management guidelines.  Patient also reports frequent multiple falls, stating he has a balance problem.  Patient lives alone in a [place]  His nearest family is a daughter who lives 45 minutes away.  Assessment was not completed today due to patient's report that his phone battery was very low.  Current Medications:  Current Outpatient Prescriptions  Medication Sig Dispense Refill  . apixaban (ELIQUIS) 5 MG TABS tablet Take 1 tablet (5 mg total) by mouth 2 (two) times daily. 180 tablet 0  . buPROPion (WELLBUTRIN SR) 100 MG 12 hr tablet Take 1 tablet (100 mg total) by mouth 2 (two) times daily. 90 tablet 1  . cholecalciferol (VITAMIN D) 1000 UNITS tablet Take 1,000 Units by mouth daily.     . CRESTOR 40 MG tablet TAKE 1 TABLET DAILY WITH BREAKFAST FOR HYPERLIPIDEMIA 90 tablet 2  . FLUoxetine (PROZAC) 10 MG capsule TAKE ONE CAPSULE BY MOUTH EVERY DAY 90 capsule 1  . metoprolol succinate (TOPROL-XL) 25 MG 24 hr tablet Take 1 tablet (25 mg total) by mouth daily. 90 tablet 3  . Multiple Vitamins-Minerals (MULTIVITAMIN WITH MINERALS) tablet Take 1 tablet by mouth daily. Reported on 01/30/2016    . naproxen sodium (ANAPROX) 220 MG tablet Take 220 mg by mouth 2 (two) times daily with a meal. Reported on 01/30/2016    . potassium chloride SA (K-DUR,KLOR-CON) 20 MEQ tablet TAKE 1 TABLET BY MOUTH EVERY DAY (Patient not taking: Reported on 01/30/2016) 30 tablet 0  . sodium chloride (OCEAN) 0.65 % SOLN nasal spray Place 1 spray into both nostrils daily as needed for congestion.     . Testosterone 20.25 MG/1.25GM (1.62%) GEL Apply 2 Squirts topically daily. One pump per shoulder.     No current facility-administered medications for this visit.    THN CM Care Plan Problem One        Most Recent Value   Care Plan Problem One  Knowledge deficit regarding self-management of CHF   Role Documenting the Problem One  [facility] [place] for Problem One  Active   THN Long Term Goal (31-90 days)  Patient will verbalize understanding and use of CHF Action Plan within 90 days.   THN Long Term Goal Start Date  01/31/16   Interventions for Problem One Long Term Goal  Education provided on self-management of CHF including recording daily weights and using 'Stop Light' tool.  Will mail CHF patient education packet for patient review.   THN CM Short Term Goal #1 (0-30 days)  Patient will report weighing daily and keeping a written record of weights within 30 days.   THN CM Short Term Goal #1 Start Date  01/31/16   Interventions for Short Term Goal #1  Education provided about need to monitorand record daily weights and to report 3 pound increase in 1 day or 5 pound increase in a week.    THN CM Care Plan Problem Two        Most Recent Value   Care Plan Problem Two  High risk for falls   Role Documenting the Problem Two  [place] for Problem Two  Active   Interventions for Problem Two Long Term Goal   Education provided  on fall prevention strategies.  Recommended he report "balance problem" and frequesnt falls to PCP.   THN Long Term Goal (31-90) days  Patient will demonstrate implementation of fall prevention strategies by reporting no falls within 60 days.   THN Long Term Goal Start Date  01/31/16     Assessment: Lack of knowledge regarding CHF self management strategies                        High risk for falls  Plan: Patient will begin weighing daily and keep a written record.           Patient will implement fall prevention strategies and report 'balance problem' to PCP.           RN will Medical laboratory scientific officer and CHF Patient Education folder.           RN will follow up within one week.  [name], RN, MSN [place] [phone] Fax [phone]

## 2016-02-05 ENCOUNTER — Other Ambulatory Visit: Payer: Self-pay

## 2016-02-05 NOTE — Patient Outreach (Signed)
[facility]) Care Management  02/05/2016  KAIRYN DESKIN 02-Nov-1936 [id number]  Unsuccessful attempt to reach patient for scheduled follow up from last week.  HIPPA appropriate message left requesting call back.  RN will follow up within 5 working days if no call back from patient.  [name], RN, MSN [place] [phone] Fax [phone]

## 2016-02-07 ENCOUNTER — Other Ambulatory Visit: Payer: Self-pay

## 2016-02-07 DIAGNOSIS — I5042 Chronic combined systolic (congestive) and diastolic (congestive) heart failure: Secondary | ICD-10-CM

## 2016-02-07 NOTE — Patient Outreach (Signed)
[place] [facility]) Care Management  02/07/2016  Aaron Berg 1936/04/15 [id number]  Follow up call today to complete initial assessment.  Patient reports he continues to fall.  States he doesn't get dizzy or lightheaded, that his legs don't give way, and that he does not faint or lose consciousness.  He reports a large bruise on his right side, and that his knees and elbows are "banged up".  Fall prevention strategies reinforced.    Patient declined offer of a community nursing visit to evaluate fall injuries and safety of his environment.    Patient has appointment on 2-28 with Dr. Redmond School.    Plan:  Patient will monitor daily weights and BP and take record to PCP appointment on 02-12-16.           Patient will practice safety/fall precautions.           Patient will report falls to PCP           RN will follow up within one month.  Aaron Mile, RN, MSN [place] [phone] Fax [phone]

## 2016-02-12 ENCOUNTER — Encounter: Payer: Self-pay | Admitting: Family Medicine

## 2016-02-12 ENCOUNTER — Ambulatory Visit (INDEPENDENT_AMBULATORY_CARE_PROVIDER_SITE_OTHER): Payer: Medicare Other | Admitting: Family Medicine

## 2016-02-12 VITALS — BP 126/82 | HR 73 | Wt 220.4 lb

## 2016-02-12 DIAGNOSIS — I2510 Atherosclerotic heart disease of native coronary artery without angina pectoris: Secondary | ICD-10-CM | POA: Diagnosis not present

## 2016-02-12 DIAGNOSIS — R296 Repeated falls: Secondary | ICD-10-CM | POA: Diagnosis not present

## 2016-02-12 DIAGNOSIS — M79659 Pain in unspecified thigh: Secondary | ICD-10-CM

## 2016-02-12 DIAGNOSIS — G4730 Sleep apnea, unspecified: Secondary | ICD-10-CM

## 2016-02-12 DIAGNOSIS — F32A Depression, unspecified: Secondary | ICD-10-CM

## 2016-02-12 DIAGNOSIS — F329 Major depressive disorder, single episode, unspecified: Secondary | ICD-10-CM

## 2016-02-12 MED ORDER — BUPROPION HCL ER (SR) 150 MG PO TB12: 150.0000 mg | ORAL_TABLET | Freq: Two times a day (BID) | ORAL | Status: DC

## 2016-02-12 NOTE — Progress Notes (Signed)
   Subjective:    Patient ID: Aaron Berg, male    DOB: [birth year], 80 y.o.   MRN: [id number]  HPI He is here accompanied by his daughter for follow-up visit. He is presently on Wellbutrin 100 twice a day. He really cannot notice any improvement in his psychological well-being. He also has a history of multiple falls. He states that he can start walking, feel bilateral thigh pain and then fall. He apparently has also had falls with no precipitating event. He does have a previous history of back pain with MRI evidence of multiple issues. He has seen Dr. Joya Salm in the past for this. Presently he is walking and using a cane. He is living alone. He also has a history of OSA and has had his CPAP for over 5 years. He has not had a refill on this in quite some time.   Review of Systems     Objective:   Physical Exam Alert and in no distress with relatively flat affect. PH Q9 was performed.Large ecchymotic areas noted on the right flank.       Assessment & Plan:  Falls frequently - Plan: Ambulatory referral to Neurosurgery  Depression - Plan: buPROPion (WELLBUTRIN SR) 150 MG 12 hr tablet  Sleep apnea  Pain of thigh, unspecified laterality - Plan: Ambulatory referral to Neurosurgery I will refer him to Dr. Joya Salm for follow-up on his back pain. Discussed the fact that he needs to get into assisted living to help with his ADLs. I will also have PT/OT come by and do an evaluation to find out what his needs are at home. CPAP prescription written.his Wellbutrin will be increased to 150 twice a day.Return here in 1 month. Over 25 minutes, greater than 50% spent in counseling and coordination of care.

## 2016-02-12 NOTE — Progress Notes (Signed)
Made appoint Dr Joya Salm 3/20 @ 11 am X-rays & registration 11:30 visit w/ dr.

## 2016-02-14 NOTE — Progress Notes (Signed)
   Subjective:    Patient ID: Aaron Berg, male    DOB: Aug 06, 1936, 80 y.o.   MRN: [id number]  HPI He continues to use his CPAP regularly and does note a benefit from this.   Review of Systems     Objective:   Physical Exam        Assessment & Plan:  Falls frequently - Plan: Ambulatory referral to Neurosurgery  Depression - Plan: buPROPion (WELLBUTRIN SR) 150 MG 12 hr tablet  Sleep apnea  Pain of thigh, unspecified laterality - Plan: Ambulatory referral to Neurosurgery

## 2016-02-19 ENCOUNTER — Other Ambulatory Visit: Payer: Self-pay | Admitting: Interventional Cardiology

## 2016-02-21 ENCOUNTER — Other Ambulatory Visit: Payer: Self-pay | Admitting: Family Medicine

## 2016-02-27 ENCOUNTER — Telehealth: Payer: Self-pay

## 2016-02-27 ENCOUNTER — Other Ambulatory Visit: Payer: Self-pay

## 2016-02-27 NOTE — Patient Outreach (Signed)
[facility]) Care Management  02/27/2016  Aaron Berg 1936/06/04 [id number]   Spoke by phone with patient's daughter and Marlene Lard.  She reports she accompanied her father to an appointment with PCP Dr. Redmond School last week.  There was a discussion with Dr. Redmond School about the patient's frequent falls, and a need for an orthopedic referral for leg pain which may be from a back problem.    The daughter and I discussed issues of the patient's safety.  He has frequent falls.  He lives alone in a motor home.  She states they have looked at placement options but that there are waiting lists wherever she has looked.  Her understanding from Dr. Redmond School was that [facility] would be providing home health services, such as PT, OT, DME, and possibly assistance with placement in an assisted living facility.  Discussed [facility] care management services, and offered to have a [facility] community RN visit to evaluate the patient's needs.  However, I will first check to see if Dr. Redmond School has made a home health referral.  Hanley Seamen the daughter my contact information, as well as the 24 hour nurse line number, and encouraged her to call with any concerns.  [name], RN, MSN [place] [phone] Fax [phone]

## 2016-02-27 NOTE — Telephone Encounter (Signed)
[name] from [place] called and stated that pts daughter called her to ask about him having PT/OT assesments and assistance in placement for assisted living. I do not see it in his chart and pts daughter stated it was discussed at last OV but I seen no mention. Connie's call back is [phone] stated to call her and let her know if that was what we wanted to do and she could place the order

## 2016-02-27 NOTE — Patient Outreach (Signed)
[facility]) Care Management  02/27/2016  DYMOND EYE 1936-10-29 [id number]   Telephone call to office of PCP J. Redmond School.  Message left for his assistant asking for clarification regarding a referral for home health services.  RN will follow up if no call back received.  [name], RN, MSN [place] [phone] Fax [phone]

## 2016-02-28 ENCOUNTER — Other Ambulatory Visit: Payer: Self-pay

## 2016-02-28 NOTE — Patient Outreach (Signed)
[facility]) Care Management  02/28/2016  DERRIC DANDURAND 07/25/1936 [id number]   Follow up call with office of PCP Dr. Redmond School concerning home health referral.  Spoke with Ledell Noss.  Dr. Redmond School wants home health referral made.  Discussed with Abbie indications for PT, nursing, and possibly SW to assist with placement options.  Abbie stated she will make referral.  [name], RN, MSN [place] [phone] Fax [phone]

## 2016-02-28 NOTE — Telephone Encounter (Signed)
Go ahead and set this up. He definitely needs an evaluation

## 2016-02-28 NOTE — Telephone Encounter (Signed)
Left message on Brandon Melnick vm that she can set patient up for this. Asked if she could call me back and let me know she got the message.

## 2016-02-28 NOTE — Patient Outreach (Addendum)
[facility]) Care Management  02/28/2016  FHER WYNDHAM 12-24-1935 [id number]   Telephone to [name], daughter and [facility] for patient.  Informed her that I had spoken with Dr. Lanice Shirts office and that they will make a home health referral.  Also offered [facility] SW services to assist with placement issues.  She is looking at independent living facilities.  She will let me know if she wants assistance with this.  [name], RN, MSN [place] [phone] Fax [phone]

## 2016-02-28 NOTE — Telephone Encounter (Signed)
Aaron Berg,  Please handle this 

## 2016-03-03 DIAGNOSIS — M4316 Spondylolisthesis, lumbar region: Secondary | ICD-10-CM | POA: Diagnosis not present

## 2016-03-03 DIAGNOSIS — Z6833 Body mass index (BMI) 33.0-33.9, adult: Secondary | ICD-10-CM | POA: Diagnosis not present

## 2016-03-03 DIAGNOSIS — M549 Dorsalgia, unspecified: Secondary | ICD-10-CM | POA: Diagnosis not present

## 2016-03-04 ENCOUNTER — Ambulatory Visit: Payer: Self-pay

## 2016-03-04 ENCOUNTER — Other Ambulatory Visit: Payer: Self-pay

## 2016-03-04 DIAGNOSIS — I5042 Chronic combined systolic (congestive) and diastolic (congestive) heart failure: Secondary | ICD-10-CM

## 2016-03-04 NOTE — Patient Outreach (Signed)
[facility]) Care Management  03/04/2016  Aaron Berg 1936-10-19 [id number]   Telephonic assessment with patient.  He saw Dr. Redmond School last week.  Discussed with him that Dr. Redmond School has ordered a PT and home safety assessment referral to [place], and he should expect to hear from them later this week.  Patient states he fell 3 times over the weekend, but did not suffer any injuries.  Fall precautions discussed.  Patient reports he saw a neurologist yesterday, and that he is ordering an MRI to evaluate back/leg problems.  Patient states he is trying to weigh daily, and that he has lost weight, down to 219 pounds from 225 last month.  Discussed indications for weighing daily to monitor for sudden increases that may indicate fluid retention.  Patient states weight loss may be due to poor appetite.  RN will follow up in approximately one month.  [name], RN, MSN [place] [phone] Fax [phone]

## 2016-03-05 ENCOUNTER — Other Ambulatory Visit: Payer: Self-pay | Admitting: Neurosurgery

## 2016-03-05 DIAGNOSIS — M4316 Spondylolisthesis, lumbar region: Secondary | ICD-10-CM

## 2016-03-06 ENCOUNTER — Telehealth: Payer: Self-pay

## 2016-03-06 NOTE — Telephone Encounter (Signed)
Cardiac clearance placed in MR nurse fax box  to be faxed

## 2016-03-06 NOTE — Telephone Encounter (Signed)
It is okay to hold Eliquis for 48 hours prior to lumbar procedure

## 2016-03-06 NOTE — Telephone Encounter (Signed)
Cardiac clearance request.  [facility] are requesting an ok for the patient to HOLD his Eliquis for 2 days prior to a lumbar myelogram. They are awaiting Dr.Smith's ok before scheduling.  Response should be faxed to [phone]

## 2016-03-06 NOTE — Telephone Encounter (Signed)
The patient is cleared to proceed with the procedure. It is okay to hold Eliquis for 48 hours prior to the procedure.

## 2016-03-07 ENCOUNTER — Telehealth: Payer: Self-pay | Admitting: Interventional Cardiology

## 2016-03-07 NOTE — Telephone Encounter (Signed)
[place] called in asking if the nurse could fax back the clearance form on the pt stating that the pt is cleared. Please fax this to [phone]

## 2016-03-07 NOTE — Telephone Encounter (Signed)
Spoke w/[place] regarding the clearance form.  She viewed clearance in epic so doesn't need any additional information.

## 2016-03-11 ENCOUNTER — Ambulatory Visit: Payer: Medicare Other | Admitting: Family Medicine

## 2016-03-12 ENCOUNTER — Ambulatory Visit
Admission: RE | Admit: 2016-03-12 | Discharge: 2016-03-12 | Disposition: A | Discharge status: Home / Self Care | Payer: Medicare Other | Source: Ambulatory Visit | Attending: Neurosurgery | Admitting: Neurosurgery

## 2016-03-12 DIAGNOSIS — M4806 Spinal stenosis, lumbar region: Secondary | ICD-10-CM | POA: Diagnosis not present

## 2016-03-12 DIAGNOSIS — M4316 Spondylolisthesis, lumbar region: Secondary | ICD-10-CM

## 2016-03-12 MED ORDER — IOPAMIDOL (ISOVUE-M 200) INJECTION 41%
15.0000 mL | Freq: Once | INTRAMUSCULAR | Status: AC
  Administered 2016-03-12: 15 mL via INTRATHECAL

## 2016-03-12 MED ORDER — DIAZEPAM 5 MG PO TABS
5.0000 mg | ORAL_TABLET | Freq: Once | ORAL | Status: AC
  Administered 2016-03-12: 5 mg via ORAL

## 2016-03-12 NOTE — Discharge Instructions (Signed)
Myelogram Discharge Instructions  1. Go home and rest quietly for the next 24 hours.  It is important to lie flat for the next 24 hours.  Get up only to go to the restroom.  You may lie in the bed or on a couch on your back, your stomach, your left side or your right side.  You may have one pillow under your head.  You may have pillows between your knees while you are on your side or under your knees while you are on your back.  2. DO NOT drive today.  Recline the seat as far back as it will go, while still wearing your seat belt, on the way home.  3. You may get up to go to the bathroom as needed.  You may sit up for 10 minutes to eat.  You may resume your normal diet and medications unless otherwise indicated.  Drink lots of extra fluids today and tomorrow.  4. The incidence of headache, nausea, or vomiting is about 5% (one in 20 patients).  If you develop a headache, lie flat and drink plenty of fluids until the headache goes away.  Caffeinated beverages may be helpful.  If you develop severe nausea and vomiting or a headache that does not go away with flat bed rest, call [phone].  5. You may resume normal activities after your 24 hours of bed rest is over; however, do not exert yourself strongly or do any heavy lifting tomorrow. If when you get up you have a headache when standing, go back to bed and force fluids for another 24 hours.  6. Call your physician for a follow-up appointment.  The results of your myelogram will be sent directly to your physician by the following day.  7. If you have any questions or if complications develop after you arrive home, please call [phone].  Discharge instructions have been explained to the patient.  The patient, or the person responsible for the patient, fully understands these instructions.       MAY RESUME ELIQUIS TODAY.   May resume Wellbutrin and Prozac on March 13, 2016, after 11:00 am.

## 2016-03-12 NOTE — Progress Notes (Signed)
Patient states he has been off Eliquis, Prozac and Wellbutrin for at least the past two days.  [name], RN

## 2016-03-13 ENCOUNTER — Telehealth: Payer: Self-pay | Admitting: Internal Medicine

## 2016-03-13 NOTE — Telephone Encounter (Signed)
Allen with home health PT called to let us know that they were trying to go out this week to start PT on patient and he declined and said he did not want to this week as he was just to tired and just wait until next week. Zenia Resides was just calling to let you know that there is a delay in starting PT with patient and they will try next week

## 2016-03-17 ENCOUNTER — Telehealth: Payer: Self-pay | Admitting: Internal Medicine

## 2016-03-17 ENCOUNTER — Other Ambulatory Visit: Payer: Self-pay | Admitting: Family Medicine

## 2016-03-17 ENCOUNTER — Telehealth: Payer: Self-pay | Admitting: Family Medicine

## 2016-03-17 ENCOUNTER — Ambulatory Visit (INDEPENDENT_AMBULATORY_CARE_PROVIDER_SITE_OTHER): Payer: Medicare Other | Admitting: Family Medicine

## 2016-03-17 VITALS — BP 120/78 | HR 68 | Wt 213.6 lb

## 2016-03-17 DIAGNOSIS — F329 Major depressive disorder, single episode, unspecified: Secondary | ICD-10-CM | POA: Diagnosis not present

## 2016-03-17 DIAGNOSIS — G3184 Mild cognitive impairment, so stated: Secondary | ICD-10-CM

## 2016-03-17 DIAGNOSIS — R296 Repeated falls: Secondary | ICD-10-CM | POA: Diagnosis not present

## 2016-03-17 DIAGNOSIS — G4730 Sleep apnea, unspecified: Secondary | ICD-10-CM | POA: Diagnosis not present

## 2016-03-17 DIAGNOSIS — I2510 Atherosclerotic heart disease of native coronary artery without angina pectoris: Secondary | ICD-10-CM

## 2016-03-17 DIAGNOSIS — F32A Depression, unspecified: Secondary | ICD-10-CM

## 2016-03-17 MED ORDER — DONEPEZIL HCL 5 MG PO TABS: 5.0000 mg | ORAL_TABLET | Freq: Every day | ORAL | Status: DC

## 2016-03-17 NOTE — Patient Instructions (Signed)
Call in 2 weeks and let me know how you're doing on the Aricept

## 2016-03-17 NOTE — Telephone Encounter (Signed)
Daughter states wanted to let you know that she thinks pt has dementia and is interested in Aricept or something to help him.  He is very forgetful, says things that don't make sense.  Wants to talk to you about this but didn't want to really say in front of pt that she thinks he has dementia.  But wants to discuss any meds that might help his memory.

## 2016-03-17 NOTE — Telephone Encounter (Signed)
Zenia Resides, PT with [facility] called to let us know that pt fell 3 times over the weekend and was asked to schedule something but declined multiple times due to appts, not feeling up to it because he was tired. So when patient was in today, he was told by me that he really needs to schedule something so he can be evaluated to help his health and he seemed like it was no big deal to him but his daughter was also told that it was important and she said she was going to try and get something scheduled. Dr. Redmond School was aware of this before he went in with patient

## 2016-03-17 NOTE — Progress Notes (Signed)
   Subjective:    Patient ID: Aaron Berg, male    DOB: 02/02/1936, 80 y.o.   MRN: [id number]  HPI He is here with his daughter for a visit. He is scheduled to see Dr. Joya Salm for follow-up on his back tomorrow. He does have an underlying history of OSA but presently is not using his CPAP. He currently does not know how to use this although he has been given instructions. He does admit to memory difficulties. He has also had trouble with dizziness and multiple falls. Review of the record indicates this has been going on for quite some time but the memory has become much worse. He also complains of his ear being clogged up. Is been seen in the past by neurology for these issues. Apparently he has fallen 3 times within the last several weeks. Continues on low-dose Prozac and Wellbutrin.He apparently has agreed to go into assisted living and they do have a place pigtail.  Review of Systems     Objective:   Physical Exam Alert and in no distress. TMs and canals are normal. Reflexes are 2-3+. Cardiac exam shows a 1/6 systolic murmur.MMSE 22       Assessment & Plan:  Mild cognitive impairment  Falls frequently  Depression  Sleep apnea  Multiple falls Strongly encouraged him to go into assisted living. We will work with him to get his CPAP set up so he can use it more effectively. I will have him evaluated by neurology. His daughter will set up an appointment so she can be there. He will be started on Aricept. I explained that the neurologist might possibly switch to a different medication or combination of medicines. He will follow-up with Dr. Joya Salm as scheduled. He has multiple reasons for his dizziness/falls and memory issues. Hopefully when he gets into assisted living we can have him get set up for CPAP to get this under better control.

## 2016-03-17 NOTE — Telephone Encounter (Signed)
Is this okay to refill? 

## 2016-03-18 ENCOUNTER — Telehealth: Payer: Self-pay | Admitting: Family Medicine

## 2016-03-18 DIAGNOSIS — G3184 Mild cognitive impairment, so stated: Secondary | ICD-10-CM

## 2016-03-18 NOTE — Telephone Encounter (Signed)
Aaron Berg [phone] called and states Dr. Redmond School was referring her dad back to Dr. Rondall Allegra for his memory issues.  She tried to make the appt this morning and they have no referral yet.  Please call [name] [phone]

## 2016-03-18 NOTE — Telephone Encounter (Signed)
Left a detailed message for pt daughter that referral has been put in the computer that she should be able to call and schedule something but his insurance didn't need a referral done by the insurance company for him to be seen. Referral has been put in the computer

## 2016-03-20 DIAGNOSIS — M4316 Spondylolisthesis, lumbar region: Secondary | ICD-10-CM | POA: Diagnosis not present

## 2016-03-24 ENCOUNTER — Other Ambulatory Visit: Payer: Self-pay | Admitting: Neurosurgery

## 2016-03-24 DIAGNOSIS — M4316 Spondylolisthesis, lumbar region: Secondary | ICD-10-CM

## 2016-03-25 DIAGNOSIS — R5382 Chronic fatigue, unspecified: Secondary | ICD-10-CM | POA: Diagnosis not present

## 2016-03-25 DIAGNOSIS — R42 Dizziness and giddiness: Secondary | ICD-10-CM | POA: Diagnosis not present

## 2016-03-25 DIAGNOSIS — I10 Essential (primary) hypertension: Secondary | ICD-10-CM | POA: Diagnosis not present

## 2016-03-25 DIAGNOSIS — E669 Obesity, unspecified: Secondary | ICD-10-CM | POA: Diagnosis not present

## 2016-03-25 DIAGNOSIS — Z7901 Long term (current) use of anticoagulants: Secondary | ICD-10-CM | POA: Diagnosis not present

## 2016-03-25 DIAGNOSIS — Z9181 History of falling: Secondary | ICD-10-CM | POA: Diagnosis not present

## 2016-03-25 DIAGNOSIS — G4739 Other sleep apnea: Secondary | ICD-10-CM | POA: Diagnosis not present

## 2016-03-25 DIAGNOSIS — I2510 Atherosclerotic heart disease of native coronary artery without angina pectoris: Secondary | ICD-10-CM | POA: Diagnosis not present

## 2016-03-26 ENCOUNTER — Encounter: Payer: Self-pay | Admitting: Family Medicine

## 2016-03-27 ENCOUNTER — Telehealth: Payer: Self-pay | Admitting: Family Medicine

## 2016-03-27 DIAGNOSIS — I2510 Atherosclerotic heart disease of native coronary artery without angina pectoris: Secondary | ICD-10-CM | POA: Diagnosis not present

## 2016-03-27 DIAGNOSIS — R5382 Chronic fatigue, unspecified: Secondary | ICD-10-CM | POA: Diagnosis not present

## 2016-03-27 DIAGNOSIS — Z9181 History of falling: Secondary | ICD-10-CM | POA: Diagnosis not present

## 2016-03-27 DIAGNOSIS — R42 Dizziness and giddiness: Secondary | ICD-10-CM | POA: Diagnosis not present

## 2016-03-27 DIAGNOSIS — I10 Essential (primary) hypertension: Secondary | ICD-10-CM | POA: Diagnosis not present

## 2016-03-27 DIAGNOSIS — G4739 Other sleep apnea: Secondary | ICD-10-CM | POA: Diagnosis not present

## 2016-03-27 NOTE — Telephone Encounter (Signed)
Allen(PT) [facility] called and was wanting a verbal for to go see chandan says he needs to go and Evaluate him and go and see him a couple times a week, Zenia Resides can be reached at [phone]

## 2016-03-27 NOTE — Telephone Encounter (Signed)
Set this up 

## 2016-03-27 NOTE — Telephone Encounter (Signed)
Aaron Berg has been informed

## 2016-03-31 ENCOUNTER — Ambulatory Visit (INDEPENDENT_AMBULATORY_CARE_PROVIDER_SITE_OTHER): Payer: Medicare Other | Admitting: Neurology

## 2016-03-31 ENCOUNTER — Encounter: Payer: Self-pay | Admitting: Neurology

## 2016-03-31 VITALS — BP 124/82 | HR 84 | Resp 20 | Ht 68.5 in | Wt 208.0 lb

## 2016-03-31 DIAGNOSIS — F0280 Dementia in other diseases classified elsewhere without behavioral disturbance: Secondary | ICD-10-CM

## 2016-03-31 DIAGNOSIS — G301 Alzheimer's disease with late onset: Secondary | ICD-10-CM

## 2016-03-31 DIAGNOSIS — G250 Essential tremor: Secondary | ICD-10-CM | POA: Diagnosis not present

## 2016-03-31 DIAGNOSIS — I2510 Atherosclerotic heart disease of native coronary artery without angina pectoris: Secondary | ICD-10-CM

## 2016-03-31 DIAGNOSIS — F19921 Other psychoactive substance use, unspecified with intoxication with delirium: Secondary | ICD-10-CM | POA: Diagnosis not present

## 2016-03-31 DIAGNOSIS — T50905A Adverse effect of unspecified drugs, medicaments and biological substances, initial encounter: Secondary | ICD-10-CM | POA: Insufficient documentation

## 2016-03-31 DIAGNOSIS — G4733 Obstructive sleep apnea (adult) (pediatric): Secondary | ICD-10-CM | POA: Insufficient documentation

## 2016-03-31 DIAGNOSIS — F1010 Alcohol abuse, uncomplicated: Secondary | ICD-10-CM | POA: Diagnosis not present

## 2016-03-31 MED ORDER — DONEPEZIL HCL 10 MG PO TABS: 10.0000 mg | ORAL_TABLET | Freq: Every morning | ORAL | Status: DC

## 2016-03-31 NOTE — Patient Instructions (Signed)

## 2016-03-31 NOTE — Progress Notes (Signed)
PATIENT: Aaron Berg DOB: 07-29-1936  REASON FOR VISIT:  [facility] ? Started on Aricept by PCP, has been non compliant with CPAP- has never been followed here for OSA.  HISTORY FROM: patient  HISTORY OF PRESENT ILLNESS:  History from 03/31/2016, Dr. Jill Berg referred his established patient Aaron Berg for a re-evaluation today. The patient was just seen by Dr. Joya Berg for back issues,. Dr. Redmond Berg noted that the patient has an underlying history of atrial fibrillation, gait instability, obstructive sleep apnea but presently is not using CPAP also he has been given instructions.  He does have memory difficulties and had been worked up for dizziness and multiple falls as well as gait instability with Dr. Rexene Berg,  who diagnosed him with essential tremor.  Apparently he has fallen 3 times within the months of March. He continues to take Prozac and Wellbutrin. And Mini-Mental Status Examination at Dr. Lanice Berg office performed on April 3 established 22 points only. Dr. Redmond Berg feels that the untreated sleep apnea may contribute to a worsening of cognitive function. He was given a new CPAP by [facility] in February 2017, was scheduled for CPAP training but he lives alone and couldn't master the set up, thus he was deemed non compliant after 60 days- he will be charged for the machine or return it.  One complication is that the patient was not tested for sleep apnea. Last psg was approximately 10 years ago. He is now almost 50 pounds lighter than he was at the time of his last PSG. I stented different that we should first evaluated as the patient has any need for CPAP machine anymore.  The additional concern of memory impairment was also evaluated in today's visit. The patient scored 26 out of 30 points on the Mini-Mental Status Examination. This was followed by a [place] cognitive assessment in which she scored 19 out of 30. Given that the patient is hard of hearing this may have affected some of the  testing results however 19 out of 30 points would be considered early dementia and not mild cognitive impairment. He is unsure about the presence of atrial fibrillation now,  but had it in the past. The treatment of OSA, if present, would be a priority in atrial fibrillation. He insists that he doesn't snore. He lives alone.     (MM) Aaron Berg is a 80 year old male with a history of essential tremor, dizziness and frequent falls. He returns today for follow-up.He was seen by Dr. Rexene Berg who determined that his tremor was in fact an essential tremor and not a feature of parkinsonism. He states that he has been having dizziness since his prior surgery and rehab. According to the prior notes he has had a thorough workup for dizziness but has there has been no identifiable causes. He states that when the dizziness happens if he holds onto something it helps to resolve it. He states that in the dark it is very severe. He walks with a cane to stabilize him when the dizziness occurs. In the past he has had frequent falls. He states that at his home he usually has to hold on to the wall or furniture to keep from falling if he doesn't use his cane.  HISTORY 08/15/14 (SA):saw your patient, Aaron Berg, upon your kind request in my clinic today for second opinion of his tremors, concern for parkinsonism. The patient is unaccompanied today. As you know, Aaron Berg is a 80 year old right-handed gentleman with an underlying complex medical history of hyperlipidemia, alcohol abuse in the past, obesity, arthritis, hypertension, heart disease, status post CABG, congestive  heart failure, atrial fibrillation, skin cancer, depression, COPD, osteoarthritis, status post right total knee arthroplasty in January 2015, status post left total knee arthroplasty in February 2015, history of cardioversion, multiple other surgeries including right shoulder replacement surgery, detached retina repair, cataract surgeries, angioplasty and CABG four-vessel in 2003, tonsillectomy and appendectomy, memory loss, and obstructive sleep apnea on CPAP, who has had an upper extremity tremor for the past several years, perhaps as long as 10 years. He had seen Dr. Erling Cruz many years ago and was told he had essential tremor.  He reports problems with his balance and he has dizziness for years. He missed his appointment with me yesterday, as he fell down the lower steps of his motor home and could not get up. He had his phone with him and called EMS.  Of note, he has been on Prozac for the past several months. He has fallen multiple times since his TKAs. He has had some LE edema. He has a history of excessive alcohol use for years and usually has about 4 oz of liquor. He lives alone, has 2 grown daughters, who want him to get a call alert button. He is looking into it.     HISTORY 08/09/14 (CD): Aaron Berg is a 80 y.o.caucasian, right handed male , who is seen here as a referral from Dr. Redmond Berg for "dizziness" .  Aaron Berg has seen other specialists for the workup of dizziness which has been describing as has been described as waxing and waning but it never seems to completely go away. He has a previous history of benign positional vertigo but his current dizziness is not related to the position of the hand. Extensive neurologic workup in the past was negative he actually describes different types of dizziness that he had over the last several years.  Dizziness seems to be a prominent and permanent part of his life and there is no identifiable trigger. It can occur anytime of the day- it's not related  to meal times, is not related to the extent of sleep he may have gotten the night before. The patient had a surgery in January and February of this year replacing both knees. He has noted high in the for sleep since her surgeries and states that he craves 10-12 hours of sleep per day. He has been treated in the past for low testosterone levels, by a cream- which he often forgets.  He has noted that he is completely lost of in the dark, he cannot orient himself, needs optic/ visual control, if standing in a dark room . He has a tendency to pro pulse- and fell into the baisin in front of him.  A visiting nurse worked with him on the blue platform, he fell frequently while attended.  He lost proprioception. He was still in rehab,with his hypersomnia was note by the caretakers. He went on to outpatient PT, now completed. He has another surgery lined up, fusion in lumbar spine. Myelogram was positive for stenosis , he reported.   He has OSA, diagnosed 18 years ago and is using CPAP since. Followed by Dr. Redmond Berg, tested at [facility]  long  He has been told by Dr Erling Cruz 10 years ago, that he has essential, familiar tremor and assigned to Dr Aaron Berg.  He has noted delayed work finding , names mostly. He needs to keep a calendar for appointments.    REVIEW OF SYSTEMS: Full 14 system review of systems performed and notable only for:  Memory loss, gait instability, hearing loss, he lives alone with a pet cat in a RV.  Depression. Tremor.   ALLERGIES: Allergies  Allergen Reactions  . Procardia [Nifedipine] Rash    All over body    HOME MEDICATIONS: Outpatient Prescriptions Prior to Visit  Medication Sig Dispense Refill  . apixaban (ELIQUIS) 5 MG TABS tablet Take 1 tablet (5 mg total) by mouth 2 (two) times daily. 180 tablet 0  . buPROPion (WELLBUTRIN SR) 150 MG 12 hr tablet Take 1 tablet (150 mg total) by mouth 2 (two) times daily. 180 tablet 0  . cholecalciferol (VITAMIN D) 1000 UNITS tablet Take 1,000 Units  by mouth daily.     [name] donepezil (ARICEPT) 5 MG tablet TAKE 1 TABLET(5 MG) BY MOUTH AT BEDTIME 30 tablet 1  . FLUoxetine (PROZAC) 10 MG capsule TAKE ONE CAPSULE BY MOUTH EVERY DAY 90 capsule 0  . metoprolol succinate (TOPROL-XL) 25 MG 24 hr tablet Take 1 tablet (25 mg total) by mouth daily. 90 tablet 3  . Multiple Vitamins-Minerals (MULTIVITAMIN WITH MINERALS) tablet Take 1 tablet by mouth daily. Reported on 01/30/2016    . naproxen sodium (ANAPROX) 220 MG tablet Take 220 mg by mouth 2 (two) times daily with a meal. Reported on 01/30/2016    . rosuvastatin (CRESTOR) 40 MG tablet TAKE 1 TABLET DAILY WITH BREAKFAST FOR HYPERLIPIDEMIA 30 tablet 0  . sodium chloride (OCEAN) 0.65 % SOLN nasal spray Place 1 spray into both nostrils daily as needed for congestion.     . Testosterone 20.25 MG/1.25GM (1.62%) GEL Apply 2 Squirts topically daily. One pump per shoulder.    . potassium chloride SA (K-DUR,KLOR-CON) 20 MEQ tablet TAKE 1 TABLET BY MOUTH EVERY DAY 30 tablet 0   No facility-administered medications prior to visit.    PAST MEDICAL HISTORY: Past Medical History  Diagnosis Date  . CAD (coronary artery disease) of bypass graft   . Dyslipidemia     well controlled  . Obesity   . Arthritis   . Male hypogonadism   . Gait disorder   . Hypertension   . Persistent atrial fibrillation ([place]) 2003  . Sleep apnea     cpap  . CHF (congestive heart failure) ([place])   . Dry eyes   . Complication of anesthesia     "woke up too soon"  . Depression   . COPD (chronic obstructive pulmonary disease) ([place])     "no signs or tests"  . Headache(784.0)     silent migraines- no pain just go blind for just a minute  . Cancer ([place])     skin  . Memory loss, short term 2/15    per daughter since last surgery and placement at [facility].  "Hallucinations have stopped"  . Sleep disorder   . Multiple falls 08/09/2014  . Chronic systolic dysfunction of left ventricle   . Cerebrovascular disease 2006    multiple  ischemic changes on prior MRI  . Sleep apnea     on CPAP  . Atrial flutter ([place])   . Hypertensive cardiovascular disease     PAST SURGICAL HISTORY: Past Surgical History  Procedure Laterality Date  .  Lumbar cyst      Dr. Joya Berg  . Shoulder surgery      Right with murphy, reattach ligaments  . Video assisted thoracoscopy (vats)/thorocotomy  2003    benign nodule  . Joint replacement      right shoulder replaced  . Coronary angioplasty  1991    with stents placed  . Eye surgery Bilateral     cataract surgeries  . Eye surgery Right     detached retina  . Tonsillectomy  as child  . Appendectomy  as child  . Mohs surgery      on chest  . Total knee arthroplasty Right 01/02/2014    Procedure: RIGHT TOTAL KNEE ARTHROPLASTY;  Surgeon: Mauri Pole, MD;  Location: [facility] ORS;  Service: Orthopedics;  Laterality: Right;  . Total knee arthroplasty Left 01/31/2014    Procedure: LEFT TOTAL KNEE ARTHROPLASTY;  Surgeon: Mauri Pole, MD;  Location: [facility];  Service: Orthopedics;  Laterality: Left;  . Cardioversion N/A 04/26/2014    Procedure: CARDIOVERSION;  Surgeon: Sinclair Grooms, MD;  Location: [facility] ENDOSCOPY;  Service: Cardiovascular;  Laterality: N/A;  . Cardioversion N/A 05/24/2014    Procedure: CARDIOVERSION;  Surgeon: Sinclair Grooms, MD;  Location: [facility] ENDOSCOPY;  Service: Cardiovascular;  Laterality: N/A;  . Coronary artery bypass graft  2004    x 4    FAMILY HISTORY: Family History  Problem Relation Age of Onset  . Pancreatic cancer Father   . CAD Father   . CAD Mother   . Arthritis Sister   . Healthy Sister     SOCIAL HISTORY: Social History   Social History  . Marital Status: Divorced    Spouse Name: N/A  . Number of Children: 2  . Years of Education: Masters   Occupational History  .      retired   Social History Main Topics  . Smoking status: Former Smoker -- 30 years    Types: Cigarettes    Quit date: 06/17/1989  . Smokeless tobacco: Never Used  .  Alcohol Use: 0.0 oz/week     Comment: 4-5 oz per day before dinner  . Drug Use: No  . Sexual Activity: Not Currently   Other Topics Concern  . Not on file   Social History Narrative   Patient is single and lives alone.   Patient is retired.   Patient has a Scientist, water quality.   Patient has two adult children.   Patient is right-handed.   Patient drinks one cup of coffee daily.      Pt lives in [place] in his RV alone.   Retired Engineer, civil (consulting)      PHYSICAL EXAM  Filed Vitals:   03/31/16 1348  BP: 124/82  Pulse: 84  Resp: 20  Height: 5' 8.5" (1.74 m)  Weight: 208 lb (94.348 kg)   Body mass index is 31.16 kg/(m^2).  Generalized: Well developed, in no acute distress  The patient's neck circumference is 17-1/2 inches, he does not have dentures, Mallampati grade 3, natural teeth, mild retrognathia,  MMSE - Mini Mental State Exam 03/31/2016  Orientation to time 5  Orientation to Place 5  Registration 3  Attention/ Calculation 1  Recall 3  Language- name 2 objects 2  Language- repeat 1  Language- follow 3 step command 3  Language- read & follow direction 1  Write a sentence 1  Copy design 1  Total score 26     Montreal Cognitive Assessment  03/31/2016  Visuospatial/ Executive (0/5) 4  Naming (0/3) 3  Attention: Read list of digits (0/2) 1  Attention: Read list of letters (0/1) 1  Attention: Serial 7 subtraction starting at 100 (0/3) 1  Language: Repeat phrase (0/2) 0  Language : Fluency (0/1) 1  Abstraction (0/2) 2  Delayed Recall (0/5) 0  Orientation (0/6) 6  Total 19  Adjusted Score (based on education) 19     Neurological examination  Mentation: Alert oriented to time, place, history taking. Follows all commands speech and language fluent Cranial nerve :  Patient reports no loss of taste or smell. Pupils were equal round reactive to light. Extraocular movements were full, visual field were full on confrontational test. Facial sensation and strength  were normal. Head turning and shoulder shrug  were normal and symmetric. Motor: The motor testing reveals 5 / 5 strength of all 4 extremities and  symmetric motor tone is noted throughout.  Sensory: Sensory testing is intact to soft touch on all 4 extremities. No evidence of extinction is noted.  Coordination: Cerebellar testing reveals good finger-nose-finger and heel-to-shin bilaterally.  Gait and station: uses a cane when ambulating. Slightly wide based. Gait is slightly unsteady. Tandem gait not attempted. Romberg is negative. No drift is seen.  Reflexes: Deep tendon reflexes are symmetric and normal bilaterally.    DIAGNOSTIC DATA (LABS, IMAGING, TESTING) - I reviewed patient records, labs, notes, testing and imaging myself where available.  Lab Results  Component Value Date   WBC 8.2 01/09/2016   HGB 14.6 01/09/2016   HCT 44.6 01/09/2016   MCV 96.5 01/09/2016   PLT 122* 01/09/2016      Component Value Date/Time   NA 142 01/09/2016 0001   NA 139 02/24/2014 1200   K 4.1 01/09/2016 0001   K 4.2 02/24/2014 1200   CL 103 01/09/2016 0001   CL 106 02/24/2014 1200   CO2 21 01/09/2016 0001   CO2 30 02/24/2014 1200   GLUCOSE 68 01/09/2016 0001   GLUCOSE 97 02/24/2014 1200   BUN 16 01/09/2016 0001   BUN 25* 02/24/2014 1200   CREATININE 0.86 01/09/2016 0001   CREATININE 1.2 05/22/2014 1135   CREATININE 1.38* 02/24/2014 1200   CALCIUM 9.6 01/09/2016 0001   CALCIUM 10.3* 02/24/2014 1200   PROT 6.9 01/09/2016 0001   ALBUMIN 4.0 01/09/2016 0001   AST 31 01/09/2016 0001   ALT 20 01/09/2016 0001   ALKPHOS 61 01/09/2016 0001   BILITOT 1.0 01/09/2016 0001   GFRNONAA 50* 02/24/2014 1200   GFRNONAA 65* 02/02/2014 0421   GFRAA 58* 02/24/2014 1200   GFRAA 76* 02/02/2014 0421   Lab Results  Component Value Date   CHOL 191 01/09/2016   HDL 56 01/09/2016   LDLCALC 85 01/09/2016   TRIG 251* 01/09/2016   CHOLHDL 3.4 01/09/2016   Lab Results  Component Value Date   HGBA1C 6.1*  08/15/2014   No results found for: VITAMINB12 Lab Results  Component Value Date   TSH 1.501 01/09/2016      ASSESSMENT AND PLAN Established [facility] patient, who was send again by Dr Aaron Berg , This time for memory evaluation and sleep apnea . 80 y.o. year old male  has a past medical history of CAD (coronary artery disease) of bypass graft; Dyslipidemia; Obesity; Arthritis; Male hypogonadism; Gait disorder; Hypertension; Persistent atrial fibrillation ([place]) (2003); Sleep apnea; CHF (congestive heart failure) ([place]); Dry eyes; Complication of anesthesia; Depression; COPD (chronic obstructive pulmonary disease) ([place]); Headache(784.0); Cancer ([place]); Memory loss, short term (2/15);  Sleep disorder; Multiple falls (08/09/2014); Chronic systolic dysfunction of left ventricle; Cerebrovascular disease (2006); Sleep apnea; Atrial flutter ([place]); and Hypertensive cardiovascular disease. here with:  1) untreated OSA Mr. Richman was given a new CPAP and according to him without much instruction, and the new CPAP has barely been used. Medicare therefore considered is a noncompliant. My concern is that a patient with memory loss may need more assistance to become a compliant CPAP user especially if confronted with new headgear. My additional question is if the patient still has apnea and his daughter mentioned that he lost about 50 pounds in the last decade. Since his last CPAP study took place. I will order a split  PSG, attended study - to be split as the AHI exceeds 15.     2)early dementia: In addition we performed today to memory tests, one is a Folstein Mini-Mental exam in which she scored 26 out of 30 points and because he based the Lillia Corporal so very well be exposed him to the [facility] cognitive assessment and he scored 19 out of 30 points.  5 points in delayed recall were missed,  which indicates short-term memory loss.  He was unable to repeat 2 rather convoluted sentences, which may be related to hearing loss  he did well with trail making, clock drawing and naming but he could not read a list of numbers in reverse order or subtract 7 from 100. Based on this I would consider him to be in the stages of early dementia rather than mild cognitive impairment the best assessment however would be with hearing aids for correction of his hearing loss.  I agree with starting Aricept and increasing the dose to 10 mg in 4 weeks.    3) Tremor, gait ; Patient has been evaluated 2015 by Dr. Rexene Berg who ruled out Parkinson's Disorder and will continue to follow his gait instability.  Over the years he has had a thorough work-up for dizziness, a blocked up feeling in both ears, hearing loss - without finding an etiology.  He does have balance and gait instability. He has had several falls in the past. I think the patient would benefit from physical therapy for gait and balance training.  A CT of the head was ordered at his last visit with Dr. Brett Fairy,  but he states that he was unable to complete that because insurance would not cover it.  I will have our staff look into that. I will order an imaging study to assess vascular dementia versus early Alzheimer's.   The patient will follow-up in 3 months with me or sooner if needed.   Jamarious Febo, MD  03/31/2016, 2:07 PM [facility] [address], [place], [place] [address] [phone]

## 2016-04-01 ENCOUNTER — Other Ambulatory Visit: Payer: Self-pay

## 2016-04-01 DIAGNOSIS — I5042 Chronic combined systolic (congestive) and diastolic (congestive) heart failure: Secondary | ICD-10-CM

## 2016-04-01 NOTE — Patient Outreach (Addendum)
Aaron Berg [facility]) Care Management  04/01/2016  Aaron Berg [birth year] [id number]   Telephone contact with patient.  He states he is in the process of moving to an apartment (patient did not identify whether this is an assisted living facility, but his daughter previously told this RN that she was looking at placement options.)  He was waiting for her arrival when I called.  Patient has had neurological evaluation for early dementia and has been started on Aricept.  RN will check back with patient (or with daughter/HCPOA) within one week to check on new living arrangement.  [name], RN, MSN [place] [phone] Fax [phone]

## 2016-04-02 ENCOUNTER — Other Ambulatory Visit: Payer: Self-pay | Admitting: Interventional Cardiology

## 2016-04-02 ENCOUNTER — Other Ambulatory Visit: Payer: Self-pay | Admitting: Neurosurgery

## 2016-04-02 ENCOUNTER — Ambulatory Visit
Admission: RE | Admit: 2016-04-02 | Discharge: 2016-04-02 | Disposition: A | Discharge status: Home / Self Care | Payer: Medicare Other | Source: Ambulatory Visit | Attending: Neurosurgery | Admitting: Neurosurgery

## 2016-04-02 DIAGNOSIS — M47817 Spondylosis without myelopathy or radiculopathy, lumbosacral region: Secondary | ICD-10-CM | POA: Diagnosis not present

## 2016-04-02 DIAGNOSIS — M4316 Spondylolisthesis, lumbar region: Secondary | ICD-10-CM

## 2016-04-02 MED ORDER — METHYLPREDNISOLONE ACETATE 40 MG/ML INJ SUSP (RADIOLOG
120.0000 mg | Freq: Once | INTRAMUSCULAR | Status: AC
  Administered 2016-04-02: 120 mg via EPIDURAL

## 2016-04-02 MED ORDER — IOHEXOL 180 MG/ML  SOLN
1.0000 mL | Freq: Once | INTRAMUSCULAR | Status: AC | PRN
  Administered 2016-04-02: 1 mL via EPIDURAL

## 2016-04-02 MED ORDER — ROSUVASTATIN CALCIUM 40 MG PO TABS: ORAL_TABLET | ORAL | Status: DC

## 2016-04-02 NOTE — Discharge Instructions (Signed)

## 2016-04-03 DIAGNOSIS — G4739 Other sleep apnea: Secondary | ICD-10-CM | POA: Diagnosis not present

## 2016-04-03 DIAGNOSIS — R42 Dizziness and giddiness: Secondary | ICD-10-CM | POA: Diagnosis not present

## 2016-04-03 DIAGNOSIS — R5382 Chronic fatigue, unspecified: Secondary | ICD-10-CM | POA: Diagnosis not present

## 2016-04-03 DIAGNOSIS — I10 Essential (primary) hypertension: Secondary | ICD-10-CM | POA: Diagnosis not present

## 2016-04-03 DIAGNOSIS — Z9181 History of falling: Secondary | ICD-10-CM | POA: Diagnosis not present

## 2016-04-03 DIAGNOSIS — I2510 Atherosclerotic heart disease of native coronary artery without angina pectoris: Secondary | ICD-10-CM | POA: Diagnosis not present

## 2016-04-04 DIAGNOSIS — G4739 Other sleep apnea: Secondary | ICD-10-CM | POA: Diagnosis not present

## 2016-04-04 DIAGNOSIS — R42 Dizziness and giddiness: Secondary | ICD-10-CM | POA: Diagnosis not present

## 2016-04-04 DIAGNOSIS — R5382 Chronic fatigue, unspecified: Secondary | ICD-10-CM | POA: Diagnosis not present

## 2016-04-04 DIAGNOSIS — I10 Essential (primary) hypertension: Secondary | ICD-10-CM | POA: Diagnosis not present

## 2016-04-04 DIAGNOSIS — I2510 Atherosclerotic heart disease of native coronary artery without angina pectoris: Secondary | ICD-10-CM | POA: Diagnosis not present

## 2016-04-04 DIAGNOSIS — Z9181 History of falling: Secondary | ICD-10-CM | POA: Diagnosis not present

## 2016-04-08 ENCOUNTER — Other Ambulatory Visit: Payer: Self-pay

## 2016-04-08 DIAGNOSIS — R42 Dizziness and giddiness: Secondary | ICD-10-CM | POA: Diagnosis not present

## 2016-04-08 DIAGNOSIS — R5382 Chronic fatigue, unspecified: Secondary | ICD-10-CM | POA: Diagnosis not present

## 2016-04-08 DIAGNOSIS — I2510 Atherosclerotic heart disease of native coronary artery without angina pectoris: Secondary | ICD-10-CM | POA: Diagnosis not present

## 2016-04-08 DIAGNOSIS — I5042 Chronic combined systolic (congestive) and diastolic (congestive) heart failure: Secondary | ICD-10-CM

## 2016-04-08 DIAGNOSIS — G4739 Other sleep apnea: Secondary | ICD-10-CM | POA: Diagnosis not present

## 2016-04-08 DIAGNOSIS — I10 Essential (primary) hypertension: Secondary | ICD-10-CM | POA: Diagnosis not present

## 2016-04-08 DIAGNOSIS — Z9181 History of falling: Secondary | ICD-10-CM | POA: Diagnosis not present

## 2016-04-08 NOTE — Patient Outreach (Signed)
[facility]) Care Management  04/08/2016  [name] 10-03-1936 [id number]   Follow up call with patient.  He has moved into an independent living apartment in [place].  He reports he likes his new living arrangements- he is provided 3 meals a day and other amenities.  Patient reports he continues to take his medications on his own.  He also reports he is weighing daily and keeping a record, and that his weights have been stable, ranging from 205-209 pounds. This is a significant weight loss from 220 to 225 pounds in February.  He states he is eating well now that he has access to 3 meals a day.  Patient reports he continues to receive PT services, and that the incidence of falling is much improved.  Pt will take meds as ordered, limit his salt intake, and weigh daily. RN will follow up in approximately one month.  [name], RN, MSN [place] [phone] Fax [phone]

## 2016-04-10 DIAGNOSIS — G4739 Other sleep apnea: Secondary | ICD-10-CM | POA: Diagnosis not present

## 2016-04-10 DIAGNOSIS — I2510 Atherosclerotic heart disease of native coronary artery without angina pectoris: Secondary | ICD-10-CM | POA: Diagnosis not present

## 2016-04-10 DIAGNOSIS — R42 Dizziness and giddiness: Secondary | ICD-10-CM | POA: Diagnosis not present

## 2016-04-10 DIAGNOSIS — R5382 Chronic fatigue, unspecified: Secondary | ICD-10-CM | POA: Diagnosis not present

## 2016-04-10 DIAGNOSIS — I10 Essential (primary) hypertension: Secondary | ICD-10-CM | POA: Diagnosis not present

## 2016-04-10 DIAGNOSIS — Z9181 History of falling: Secondary | ICD-10-CM | POA: Diagnosis not present

## 2016-04-11 DIAGNOSIS — R42 Dizziness and giddiness: Secondary | ICD-10-CM | POA: Diagnosis not present

## 2016-04-11 DIAGNOSIS — I10 Essential (primary) hypertension: Secondary | ICD-10-CM | POA: Diagnosis not present

## 2016-04-11 DIAGNOSIS — Z9181 History of falling: Secondary | ICD-10-CM | POA: Diagnosis not present

## 2016-04-11 DIAGNOSIS — I2510 Atherosclerotic heart disease of native coronary artery without angina pectoris: Secondary | ICD-10-CM | POA: Diagnosis not present

## 2016-04-11 DIAGNOSIS — G4739 Other sleep apnea: Secondary | ICD-10-CM | POA: Diagnosis not present

## 2016-04-11 DIAGNOSIS — R5382 Chronic fatigue, unspecified: Secondary | ICD-10-CM | POA: Diagnosis not present

## 2016-04-15 ENCOUNTER — Telehealth: Payer: Self-pay | Admitting: Family Medicine

## 2016-04-15 DIAGNOSIS — I10 Essential (primary) hypertension: Secondary | ICD-10-CM | POA: Diagnosis not present

## 2016-04-15 DIAGNOSIS — Z9181 History of falling: Secondary | ICD-10-CM | POA: Diagnosis not present

## 2016-04-15 DIAGNOSIS — I2510 Atherosclerotic heart disease of native coronary artery without angina pectoris: Secondary | ICD-10-CM | POA: Diagnosis not present

## 2016-04-15 DIAGNOSIS — G4739 Other sleep apnea: Secondary | ICD-10-CM | POA: Diagnosis not present

## 2016-04-15 DIAGNOSIS — R42 Dizziness and giddiness: Secondary | ICD-10-CM | POA: Diagnosis not present

## 2016-04-15 DIAGNOSIS — R5382 Chronic fatigue, unspecified: Secondary | ICD-10-CM | POA: Diagnosis not present

## 2016-04-15 NOTE — Telephone Encounter (Signed)
Aaron Berg with [facility] called & states pt told the nurse he's not using CPAP, says the mask is uncomfortable & pt told her that he's not going for sleep study and states pt seems to be confused. Aaron Berg is going to get him in for mask refit

## 2016-04-17 ENCOUNTER — Other Ambulatory Visit: Payer: Self-pay | Admitting: Neurosurgery

## 2016-04-17 DIAGNOSIS — M4316 Spondylolisthesis, lumbar region: Secondary | ICD-10-CM

## 2016-04-18 DIAGNOSIS — R5382 Chronic fatigue, unspecified: Secondary | ICD-10-CM | POA: Diagnosis not present

## 2016-04-18 DIAGNOSIS — G4739 Other sleep apnea: Secondary | ICD-10-CM | POA: Diagnosis not present

## 2016-04-18 DIAGNOSIS — I2510 Atherosclerotic heart disease of native coronary artery without angina pectoris: Secondary | ICD-10-CM | POA: Diagnosis not present

## 2016-04-18 DIAGNOSIS — Z9181 History of falling: Secondary | ICD-10-CM | POA: Diagnosis not present

## 2016-04-18 DIAGNOSIS — R42 Dizziness and giddiness: Secondary | ICD-10-CM | POA: Diagnosis not present

## 2016-04-18 DIAGNOSIS — I10 Essential (primary) hypertension: Secondary | ICD-10-CM | POA: Diagnosis not present

## 2016-04-22 DIAGNOSIS — I10 Essential (primary) hypertension: Secondary | ICD-10-CM | POA: Diagnosis not present

## 2016-04-22 DIAGNOSIS — R5382 Chronic fatigue, unspecified: Secondary | ICD-10-CM | POA: Diagnosis not present

## 2016-04-22 DIAGNOSIS — I2510 Atherosclerotic heart disease of native coronary artery without angina pectoris: Secondary | ICD-10-CM | POA: Diagnosis not present

## 2016-04-22 DIAGNOSIS — G4739 Other sleep apnea: Secondary | ICD-10-CM | POA: Diagnosis not present

## 2016-04-22 DIAGNOSIS — R42 Dizziness and giddiness: Secondary | ICD-10-CM | POA: Diagnosis not present

## 2016-04-22 DIAGNOSIS — Z9181 History of falling: Secondary | ICD-10-CM | POA: Diagnosis not present

## 2016-04-23 ENCOUNTER — Ambulatory Visit
Admission: RE | Admit: 2016-04-23 | Discharge: 2016-04-23 | Disposition: A | Discharge status: Home / Self Care | Payer: Medicare Other | Source: Ambulatory Visit | Attending: Neurology | Admitting: Neurology

## 2016-04-23 DIAGNOSIS — G301 Alzheimer's disease with late onset: Principal | ICD-10-CM

## 2016-04-23 DIAGNOSIS — F19921 Other psychoactive substance use, unspecified with intoxication with delirium: Secondary | ICD-10-CM

## 2016-04-23 DIAGNOSIS — I639 Cerebral infarction, unspecified: Secondary | ICD-10-CM | POA: Diagnosis not present

## 2016-04-23 DIAGNOSIS — F0280 Dementia in other diseases classified elsewhere without behavioral disturbance: Secondary | ICD-10-CM

## 2016-04-23 DIAGNOSIS — G250 Essential tremor: Secondary | ICD-10-CM

## 2016-04-23 DIAGNOSIS — G4733 Obstructive sleep apnea (adult) (pediatric): Secondary | ICD-10-CM

## 2016-04-23 DIAGNOSIS — T50905A Adverse effect of unspecified drugs, medicaments and biological substances, initial encounter: Secondary | ICD-10-CM

## 2016-04-24 DIAGNOSIS — G4739 Other sleep apnea: Secondary | ICD-10-CM | POA: Diagnosis not present

## 2016-04-24 DIAGNOSIS — R5382 Chronic fatigue, unspecified: Secondary | ICD-10-CM | POA: Diagnosis not present

## 2016-04-24 DIAGNOSIS — I10 Essential (primary) hypertension: Secondary | ICD-10-CM | POA: Diagnosis not present

## 2016-04-24 DIAGNOSIS — I2510 Atherosclerotic heart disease of native coronary artery without angina pectoris: Secondary | ICD-10-CM | POA: Diagnosis not present

## 2016-04-24 DIAGNOSIS — Z9181 History of falling: Secondary | ICD-10-CM | POA: Diagnosis not present

## 2016-04-24 DIAGNOSIS — R42 Dizziness and giddiness: Secondary | ICD-10-CM | POA: Diagnosis not present

## 2016-04-28 ENCOUNTER — Telehealth: Payer: Self-pay

## 2016-04-28 NOTE — Telephone Encounter (Signed)
-----   Message from Larey Seat, MD sent at 04/28/2016 11:32 AM EDT ----- No strokes, no tumor, no scars- Temporal parietal atrophy- fits dementia. CD

## 2016-04-28 NOTE — Telephone Encounter (Signed)
Spoke to pt's daughter, [name], per DPR, and advised her that per Dr. Brett Fairy, pt's MRI revealed no strokes, no tumor, no scars, but temporal parietal atrophy, which fits dementia. Pt's daughter requesting a follow up appt to discuss. Pt refused sleep study. Pt was scheduled for a follow up with Dr. Rexene Alberts for MRI results, but this is incorrect, should have been scheduled with Dr. Brett Fairy. A follow up was scheduled for 05/29/16 with Dr. Brett Fairy, and the one with Dr. Rexene Alberts was cancelled. Pt's daughter verbalized understanding. Pt's daughter had no questions at this time but was encouraged to call back if questions arise.

## 2016-04-29 DIAGNOSIS — R42 Dizziness and giddiness: Secondary | ICD-10-CM | POA: Diagnosis not present

## 2016-04-29 DIAGNOSIS — I2510 Atherosclerotic heart disease of native coronary artery without angina pectoris: Secondary | ICD-10-CM | POA: Diagnosis not present

## 2016-04-29 DIAGNOSIS — I10 Essential (primary) hypertension: Secondary | ICD-10-CM | POA: Diagnosis not present

## 2016-04-29 DIAGNOSIS — G4739 Other sleep apnea: Secondary | ICD-10-CM | POA: Diagnosis not present

## 2016-04-29 DIAGNOSIS — Z9181 History of falling: Secondary | ICD-10-CM | POA: Diagnosis not present

## 2016-04-29 DIAGNOSIS — R5382 Chronic fatigue, unspecified: Secondary | ICD-10-CM | POA: Diagnosis not present

## 2016-04-30 ENCOUNTER — Ambulatory Visit
Admission: RE | Admit: 2016-04-30 | Discharge: 2016-04-30 | Disposition: A | Discharge status: Home / Self Care | Payer: Medicare Other | Source: Ambulatory Visit | Attending: Neurosurgery | Admitting: Neurosurgery

## 2016-04-30 ENCOUNTER — Other Ambulatory Visit: Payer: Self-pay | Admitting: Neurosurgery

## 2016-04-30 DIAGNOSIS — M4316 Spondylolisthesis, lumbar region: Secondary | ICD-10-CM

## 2016-04-30 DIAGNOSIS — M47817 Spondylosis without myelopathy or radiculopathy, lumbosacral region: Secondary | ICD-10-CM | POA: Diagnosis not present

## 2016-04-30 MED ORDER — IOPAMIDOL (ISOVUE-M 200) INJECTION 41%
1.0000 mL | Freq: Once | INTRAMUSCULAR | Status: AC
  Administered 2016-04-30: 1 mL via EPIDURAL

## 2016-04-30 MED ORDER — METHYLPREDNISOLONE ACETATE 40 MG/ML INJ SUSP (RADIOLOG
120.0000 mg | Freq: Once | INTRAMUSCULAR | Status: AC
  Administered 2016-04-30: 120 mg via EPIDURAL

## 2016-04-30 NOTE — Discharge Instructions (Signed)

## 2016-05-01 ENCOUNTER — Other Ambulatory Visit: Payer: Self-pay | Admitting: *Deleted

## 2016-05-01 ENCOUNTER — Other Ambulatory Visit: Payer: Self-pay | Admitting: Interventional Cardiology

## 2016-05-01 DIAGNOSIS — I2510 Atherosclerotic heart disease of native coronary artery without angina pectoris: Secondary | ICD-10-CM | POA: Diagnosis not present

## 2016-05-01 DIAGNOSIS — R42 Dizziness and giddiness: Secondary | ICD-10-CM | POA: Diagnosis not present

## 2016-05-01 DIAGNOSIS — R5382 Chronic fatigue, unspecified: Secondary | ICD-10-CM | POA: Diagnosis not present

## 2016-05-01 DIAGNOSIS — I10 Essential (primary) hypertension: Secondary | ICD-10-CM | POA: Diagnosis not present

## 2016-05-01 DIAGNOSIS — Z9181 History of falling: Secondary | ICD-10-CM | POA: Diagnosis not present

## 2016-05-01 DIAGNOSIS — G4739 Other sleep apnea: Secondary | ICD-10-CM | POA: Diagnosis not present

## 2016-05-01 MED ORDER — METOPROLOL SUCCINATE ER 25 MG PO TB24: 25.0000 mg | ORAL_TABLET | Freq: Every day | ORAL | Status: DC

## 2016-05-06 ENCOUNTER — Other Ambulatory Visit: Payer: Self-pay

## 2016-05-06 VITALS — Wt 208.0 lb

## 2016-05-06 DIAGNOSIS — I5042 Chronic combined systolic (congestive) and diastolic (congestive) heart failure: Secondary | ICD-10-CM

## 2016-05-06 DIAGNOSIS — R42 Dizziness and giddiness: Secondary | ICD-10-CM | POA: Diagnosis not present

## 2016-05-06 DIAGNOSIS — I10 Essential (primary) hypertension: Secondary | ICD-10-CM | POA: Diagnosis not present

## 2016-05-06 DIAGNOSIS — I2510 Atherosclerotic heart disease of native coronary artery without angina pectoris: Secondary | ICD-10-CM | POA: Diagnosis not present

## 2016-05-06 DIAGNOSIS — R5382 Chronic fatigue, unspecified: Secondary | ICD-10-CM | POA: Diagnosis not present

## 2016-05-06 DIAGNOSIS — G4739 Other sleep apnea: Secondary | ICD-10-CM | POA: Diagnosis not present

## 2016-05-06 DIAGNOSIS — Z9181 History of falling: Secondary | ICD-10-CM | POA: Diagnosis not present

## 2016-05-06 NOTE — Patient Outreach (Signed)
[facility]) Care Management  05/06/2016  BRUNSON CIRIACO 1936/08/20 [id number]   Spoke with patient's daughter [name] today.  She reports patient is adjusting fairly well to new living arrangement in an independent living facility apartment.  She reports he is often going out to get something to eat instead of eating the meals provided at the facility.  Discussed patient's recent weight loss.  His recorded weight on 01-31-16 was 225 pounds, but he is now down to around 207 pounds.  Recommended addition of nutritional supplements such as Ensure to augment diet.  Patient recently had an MRI, which found signs of dementia (according to daughter and EMR).  He has a return appointment with Dr. Brett Fairy on 05-29-16.  Daughter reports she is considering placement in an assisted living facility where more supervision would be available.  RN will follow up with patient in approximately one month.  [name], RN, MSN [place] [phone] Fax [phone]

## 2016-05-15 DIAGNOSIS — G4739 Other sleep apnea: Secondary | ICD-10-CM | POA: Diagnosis not present

## 2016-05-15 DIAGNOSIS — I2510 Atherosclerotic heart disease of native coronary artery without angina pectoris: Secondary | ICD-10-CM | POA: Diagnosis not present

## 2016-05-15 DIAGNOSIS — R42 Dizziness and giddiness: Secondary | ICD-10-CM | POA: Diagnosis not present

## 2016-05-15 DIAGNOSIS — I10 Essential (primary) hypertension: Secondary | ICD-10-CM | POA: Diagnosis not present

## 2016-05-15 DIAGNOSIS — R5382 Chronic fatigue, unspecified: Secondary | ICD-10-CM | POA: Diagnosis not present

## 2016-05-15 DIAGNOSIS — Z9181 History of falling: Secondary | ICD-10-CM | POA: Diagnosis not present

## 2016-05-15 NOTE — Progress Notes (Signed)
Cardiology Office Note    Date:  05/15/2016   ID:  Aaron Berg, DOB 1936/07/01, MRN [id number]  PCP:  Aaron Haste, MD  Cardiologist: Aaron Grooms, MD   Chief Complaint  Patient presents with  . Congestive Heart Failure  . Atrial Fibrillation  . Coronary Artery Disease    History of Present Illness:  Aaron Berg is a 80 y.o. male follow-up of coronary artery disease with previous bypass grafting, hyperlipidemia, obesity, sleep apnea, chronic diastolic heart failure, and microvascular dementia. He has a history of paroxysmal atrial fibrillation. He is on chronic anticoagulation with Eliquis.  Overall, Aaron Berg is doing well. He denies angina. No episodes of syncope. No lower extremity swelling. Flat in bed without difficulty. Having difficulty with memory and balance.  Past Medical History  Diagnosis Date  . CAD (coronary artery disease) of bypass graft   . Dyslipidemia     well controlled  . Obesity   . Arthritis   . Male hypogonadism   . Gait disorder   . Hypertension   . Persistent atrial fibrillation ([place]) 2003  . Sleep apnea     cpap  . CHF (congestive heart failure) ([place])   . Dry eyes   . Complication of anesthesia     "woke up too soon"  . Depression   . COPD (chronic obstructive pulmonary disease) ([place])     "no signs or tests"  . Headache(784.0)     silent migraines- no pain just go blind for just a minute  . Cancer ([place])     skin  . Memory loss, short term 2/15    per daughter since last surgery and placement at [facility].  "Hallucinations have stopped"  . Sleep disorder   . Multiple falls 08/09/2014  . Chronic systolic dysfunction of left ventricle   . Cerebrovascular disease 2006    multiple ischemic changes on prior MRI  . Sleep apnea     on CPAP  . Atrial flutter ([place])   . Hypertensive cardiovascular disease     Past Surgical History  Procedure Laterality Date  . Lumbar cyst      Dr. Joya Salm  . Shoulder surgery      Right with  murphy, reattach ligaments  . Video assisted thoracoscopy (vats)/thorocotomy  2003    benign nodule  . Joint replacement      right shoulder replaced  . Coronary angioplasty  1991    with stents placed  . Eye surgery Bilateral     cataract surgeries  . Eye surgery Right     detached retina  . Tonsillectomy  as child  . Appendectomy  as child  . Mohs surgery      on chest  . Total knee arthroplasty Right 01/02/2014    Procedure: RIGHT TOTAL KNEE ARTHROPLASTY;  Surgeon: Mauri Pole, MD;  Location: [facility]S;  Service: Orthopedics;  Laterality: Right;  . Total knee arthroplasty Left 01/31/2014    Procedure: LEFT TOTAL KNEE ARTHROPLASTY;  Surgeon: Mauri Pole, MD;  Location: [facility];  Service: Orthopedics;  Laterality: Left;  . Cardioversion N/A 04/26/2014    Procedure: CARDIOVERSION;  Surgeon: Aaron Grooms, MD;  Location: [facility] ENDOSCOPY;  Service: Cardiovascular;  Laterality: N/A;  . Cardioversion N/A 05/24/2014    Procedure: CARDIOVERSION;  Surgeon: Aaron Grooms, MD;  Location: [facility] ENDOSCOPY;  Service: Cardiovascular;  Laterality: N/A;  . Coronary artery bypass graft  2004    x 4  Current Medications: Outpatient Prescriptions Prior to Visit  Medication Sig Dispense Refill  . apixaban (ELIQUIS) 5 MG TABS tablet Take 1 tablet (5 mg total) by mouth 2 (two) times daily. 180 tablet 0  . buPROPion (WELLBUTRIN SR) 150 MG 12 hr tablet Take 1 tablet (150 mg total) by mouth 2 (two) times daily. 180 tablet 0  . cholecalciferol (VITAMIN D) 1000 UNITS tablet Take 1,000 Units by mouth daily.     [name] donepezil (ARICEPT) 10 MG tablet Take 1 tablet (10 mg total) by mouth every morning. Start after May 5th when you have finished the 5 mg introductory dose. 90 tablet 3  . FLUoxetine (PROZAC) 10 MG capsule TAKE ONE CAPSULE BY MOUTH EVERY DAY 90 capsule 0  . metoprolol succinate (TOPROL-XL) 25 MG 24 hr tablet Take 1 tablet (25 mg total) by mouth daily. 30 tablet 0  . Multiple Vitamins-Minerals  (MULTIVITAMIN WITH MINERALS) tablet Take 1 tablet by mouth daily. Reported on 01/30/2016    . naproxen sodium (ANAPROX) 220 MG tablet Take 220 mg by mouth 2 (two) times daily with a meal. Reported on 01/30/2016    . rosuvastatin (CRESTOR) 40 MG tablet TAKE 1 TABLET DAILY WITH BREAKFAST FOR HYPERLIPIDEMIA 90 tablet 0  . sodium chloride (OCEAN) 0.65 % SOLN nasal spray Place 1 spray into both nostrils daily as needed for congestion.     . Testosterone 20.25 MG/1.25GM (1.62%) GEL Apply 2 Squirts topically daily. One pump per shoulder.     No facility-administered medications prior to visit.     Allergies:   Procardia   Social History   Social History  . Marital Status: Divorced    Spouse Name: N/A  . Number of Children: 2  . Years of Education: Masters   Occupational History  .      retired   Social History Main Topics  . Smoking status: Former Smoker -- 30 years    Types: Cigarettes    Quit date: 06/17/1989  . Smokeless tobacco: Never Used  . Alcohol Use: 0.0 oz/week     Comment: 4-5 oz per day before dinner  . Drug Use: No  . Sexual Activity: Not Currently   Other Topics Concern  . Not on file   Social History Narrative   Patient is single and lives alone.   Patient is retired.   Patient has a Scientist, water quality.   Patient has two adult children.   Patient is right-handed.   Patient drinks one cup of coffee daily.      Pt lives in [place] in his RV alone.   Retired Engineer, civil (consulting)     Family History:  The patient's family history includes Arthritis in his sister; CAD in his father and mother; Healthy in his sister; Pancreatic cancer in his father.   ROS:   Please see the history of present illness.    Status post bilateral knee replacements. Now has back stiffness and needs fusion of L3-4. Frequent falls. One with head trauma. MRI did not reveal any bleeding. One episode of focal left chest discomfort lasting 3 seconds before resolving. Otherwise no complaints.    All other systems reviewed and are negative.   PHYSICAL EXAM:   VS:  There were no vitals taken for this visit.   GEN: Well nourished, well developed, in no acute distress HEENT: normal Neck: no JVD, carotid bruits, or masses Cardiac: RRR; no murmurs, rubs, or gallops,no edema  Respiratory:  clear to auscultation bilaterally, normal work of breathing GI:  soft, nontender, nondistended, + BS MS: no deformity or atrophy Skin: warm and dry, no rash Neuro:  Alert and Oriented x 3, Strength and sensation are intact Psych: euthymic mood, full affect  Wt Readings from Last 3 Encounters:  05/06/16 208 lb (94.348 kg)  03/31/16 208 lb (94.348 kg)  03/17/16 213 lb 9.6 oz (96.888 kg)      Studies/Labs Reviewed:   EKG:  EKG  Is not performed.  Recent Labs: 01/09/2016: ALT 20; BUN 16; Creat 0.86; Hemoglobin 14.6; Platelets 122*; Potassium 4.1; Sodium 142; TSH 1.501   Lipid Panel    Component Value Date/Time   CHOL 191 01/09/2016 0001   TRIG 251* 01/09/2016 0001   HDL 56 01/09/2016 0001   CHOLHDL 3.4 01/09/2016 0001   VLDL 50* 01/09/2016 0001   LDLCALC 85 01/09/2016 0001    Additional studies/ records that were reviewed today include:  No new data other than laboratory information from Dr. Redmond School where the liver function and lipids were done. LDL cholesterol is near target 85. Total cholesterol 191. Liver enzymes were normal. This was performed on 01/09/16. There noted above.    ASSESSMENT:    1. Paroxysmal atrial fibrillation (HCC)   2. Chronic combined systolic and diastolic CHF (congestive heart failure) ([place])   3. Essential hypertension, benign   4. ASHD (arteriosclerotic heart disease)   5. Chronic anticoagulation      PLAN:  In order of problems listed above:  1. Currently maintaining normal sinus rhythm based upon clinical exam. 2. There is no evidence of volume overload or CHF on today's exam. 2 g sodium diet as advocated. 3. Excellent blood pressure control. 2 g  sodium diet as advocated. 4. Stable without angina. 5. No bleeding on chronic anticoagulation therapy. Recent fall with head trauma was evaluated with MRI of the brain.     Medication Adjustments/Labs and Tests Ordered: Current medicines are reviewed at length with the patient today.  Concerns regarding medicines are outlined above.  Medication changes, Labs and Tests ordered today are listed in the Patient Instructions below. There are no Patient Instructions on file for this visit.   Signed, Aaron Grooms, MD  05/15/2016 9:22 PM    [facility] [address], [place], [place]  [address] Phone: [phone]; Fax: [phone]

## 2016-05-16 ENCOUNTER — Ambulatory Visit (INDEPENDENT_AMBULATORY_CARE_PROVIDER_SITE_OTHER): Payer: Medicare Other | Admitting: Interventional Cardiology

## 2016-05-16 ENCOUNTER — Encounter: Payer: Self-pay | Admitting: Interventional Cardiology

## 2016-05-16 VITALS — BP 124/72 | HR 68 | Ht 68.0 in | Wt 201.2 lb

## 2016-05-16 DIAGNOSIS — Z7901 Long term (current) use of anticoagulants: Secondary | ICD-10-CM

## 2016-05-16 DIAGNOSIS — I2510 Atherosclerotic heart disease of native coronary artery without angina pectoris: Secondary | ICD-10-CM | POA: Diagnosis not present

## 2016-05-16 DIAGNOSIS — I480 Paroxysmal atrial fibrillation: Secondary | ICD-10-CM

## 2016-05-16 DIAGNOSIS — I5042 Chronic combined systolic (congestive) and diastolic (congestive) heart failure: Secondary | ICD-10-CM | POA: Diagnosis not present

## 2016-05-16 DIAGNOSIS — I10 Essential (primary) hypertension: Secondary | ICD-10-CM | POA: Diagnosis not present

## 2016-05-16 NOTE — Patient Instructions (Signed)

## 2016-05-22 ENCOUNTER — Ambulatory Visit: Payer: Medicare Other | Admitting: Neurology

## 2016-05-24 DIAGNOSIS — I4891 Unspecified atrial fibrillation: Secondary | ICD-10-CM | POA: Diagnosis not present

## 2016-05-24 DIAGNOSIS — R296 Repeated falls: Secondary | ICD-10-CM | POA: Diagnosis not present

## 2016-05-26 ENCOUNTER — Other Ambulatory Visit: Payer: Self-pay | Admitting: *Deleted

## 2016-05-26 ENCOUNTER — Telehealth: Payer: Self-pay

## 2016-05-26 MED ORDER — METOPROLOL SUCCINATE ER 25 MG PO TB24: 25.0000 mg | ORAL_TABLET | Freq: Every day | ORAL | Status: DC

## 2016-05-26 NOTE — Telephone Encounter (Signed)
Aaron Berg PT, [name] called to let us know that pt has not been taking Prozac for past 2-3 weeks.   Called pt to find out reasons why he has not taken medication. LMTCB.  I am awaiting callback, if pt just forgot to get it renewed is it ok to renew medication for him? Please advise. [name]

## 2016-05-27 ENCOUNTER — Telehealth: Payer: Self-pay

## 2016-05-27 ENCOUNTER — Other Ambulatory Visit: Payer: Self-pay | Admitting: *Deleted

## 2016-05-27 DIAGNOSIS — I4891 Unspecified atrial fibrillation: Secondary | ICD-10-CM | POA: Diagnosis not present

## 2016-05-27 DIAGNOSIS — R296 Repeated falls: Secondary | ICD-10-CM | POA: Diagnosis not present

## 2016-05-27 MED ORDER — METOPROLOL SUCCINATE ER 25 MG PO TB24: 25.0000 mg | ORAL_TABLET | Freq: Every day | ORAL | Status: DC

## 2016-05-27 MED ORDER — FLUOXETINE HCL 10 MG PO CAPS: 10.0000 mg | ORAL_CAPSULE | Freq: Every day | ORAL | Status: DC

## 2016-05-27 NOTE — Telephone Encounter (Signed)
Let him know that we did call the medicine in

## 2016-05-27 NOTE — Telephone Encounter (Signed)
Called and spoke with pt's daughter- she reports that pt has been sleeping more lately since he has not had prozac. Daughter forgot to get medications refilled.  She request refill to [facility] please. Thank you, RLB

## 2016-05-27 NOTE — Telephone Encounter (Signed)
Left message med sent in

## 2016-05-29 ENCOUNTER — Ambulatory Visit (INDEPENDENT_AMBULATORY_CARE_PROVIDER_SITE_OTHER): Payer: Medicare Other | Admitting: Neurology

## 2016-05-29 ENCOUNTER — Encounter: Payer: Self-pay | Admitting: Neurology

## 2016-05-29 VITALS — BP 124/76 | HR 70 | Resp 20 | Ht 68.0 in | Wt 200.0 lb

## 2016-05-29 DIAGNOSIS — F0280 Dementia in other diseases classified elsewhere without behavioral disturbance: Secondary | ICD-10-CM | POA: Diagnosis not present

## 2016-05-29 DIAGNOSIS — R413 Other amnesia: Secondary | ICD-10-CM | POA: Diagnosis not present

## 2016-05-29 DIAGNOSIS — R2689 Other abnormalities of gait and mobility: Secondary | ICD-10-CM

## 2016-05-29 DIAGNOSIS — G300 Alzheimer's disease with early onset: Secondary | ICD-10-CM | POA: Diagnosis not present

## 2016-05-29 DIAGNOSIS — I2510 Atherosclerotic heart disease of native coronary artery without angina pectoris: Secondary | ICD-10-CM

## 2016-05-29 DIAGNOSIS — R9082 White matter disease, unspecified: Secondary | ICD-10-CM | POA: Diagnosis not present

## 2016-05-29 NOTE — Patient Instructions (Signed)
Vascular Dementia Vascular dementia is a common cause of dementia in the elderly. Dementia is a condition that affects the brain and causes people to not think well or act normally. Vascular dementia is one type of dementia. It occurs when blood clots block small blood vessels in the brain and destroy brain tissue. Likely risk factors are high blood pressure and advanced age. This disease can cause stroke, migraine-like headaches, and psychiatric disturbances.  SYMPTOMS   Confusion.  Problems with recent memory.  Wandering or getting lost in familiar places.  Loss of bladder or bowel control (incontinence).  Unsteady gait.  Poor attention and concentration.  Emotional problems such as laughing or crying inappropriately.  Difficulty following instructions.  Problems handling money.  Depression.  Difficulty planning ahead. Usually the damage is slight at first. Over time, as more small vessels are blocked, there is a gradual mental decline. However, symptoms may begin suddenly. Symptoms may be very similar to Alzheimer's disease. The two forms of dementia may occur together. Vascular dementia typically begins between the ages of 60 and 75. It affects men more often than women. TREATMENT   Currently there is no treatment for vascular dementia that can reverse the damage that has already occurred.  Treatment focuses on prevention of additional brain damage and improvement of symptoms.  It is important to treat the risk factors for vascular dementia, such as keeping blood pressure under control, treating diabetes, lowering cholesterol, and stop smoking. PROGNOSIS   Prognosis for patients is generally poor. Individuals with the disease may improve for short periods of time, then get worse again. Early treatment and management of blood pressure and other risk factors may prevent further worsening of the disorder.   This information is not intended to replace advice given to you by your  health care provider. Make sure you discuss any questions you have with your health care provider.   Document Released: 11/21/2002 Document Revised: 02/23/2012 Document Reviewed: 03/14/2015 Elsevier Interactive Patient Education 2016 [employer]  

## 2016-05-29 NOTE — Progress Notes (Signed)
PATIENT: Aaron Berg DOB: 1936/04/10  REASON FOR VISIT:  [facility] ?  Dr Aaron Berg has seen this patient.    Started on Aricept by PCP, has been non compliant with CPAP- has never been followed here for OSA.   HISTORY FROM: patient  HISTORY OF PRESENT ILLNESS:  Dr. Jill Berg referred his established patient Aaron Berg for a re-evaluation today. The patient was just seen by Dr. Joya Berg for back issues, Dr. Redmond Berg noted that the patient has an underlying history of atrial fibrillation, gait instability, obstructive sleep apnea but presently is not using CPAP also he has been given instructions.  He does have memory difficulties and had been worked up for dizziness and multiple falls as well as gait instability with Dr. Rexene Berg,  who diagnosed him with essential tremor.  Apparently he has fallen 3 times within the months of March. He continues to take Prozac and Wellbutrin. And Mini-Mental Status Examination at Dr. Lanice Berg office performed on April 3rd established 22 points only. Dr. Redmond Berg feels that the untreated sleep apnea may contribute to a worsening of cognitive function. He was given a new CPAP by Aaron Berg in February 2017, was scheduled for CPAP training but he lives alone and couldn't master the set up, thus he was deemed non compliant after 60 days- he will be charged for the machine or return it.  One complication is that the patient was not tested for sleep apnea. Last psg was approximately 10 years ago. He is now almost 50 pounds lighter than he was at the time of his last PSG. I stented different that we should first evaluated as the patient has any need for CPAP machine anymore.  The additional concern of memory impairment was also evaluated in today's visit. The patient scored 26 out of 30 points on the Mini-Mental Status Examination. This was followed by a Aaron Berg cognitive assessment in which she scored 19 out of 30. Given that the patient is hard of hearing this may have  affected some of the testing results however 19 out of 30 points would be considered early dementia and not mild cognitive impairment.He is unsure about the presence of atrial fibrillation now,  but had it in the past. The treatment of OSA, if present, would be a priority in atrial fibrillation. He insists that he doesn't snore. He lives alone.   15th of June 2017, Aaron Berg and is here in the presence of his daughter. His most recent neurologic diagnostic studies included an MRI of the brain without contrast. This was performed on 04/23/2016 and showed progressive moderate diffuse cerebral and cerebellar atrophy which affects more the temporal and parietal lobes. There is also progressive moderate white matter disease a sign of chronic microvascular ischemia. In comparison to a study from 2006 there was a new stroke noted but this is still a remote stroke with scoliosis, and ischemic cortical infarct in the anterior left frontal lobe. No other acute abnormalities were noted. The study was read by Dr. San Berg. He has clearly evidence of Alzheimer's dementia.  The patient underwent a memory test today this was a Mini-Mental Status Examination. 27/30 ! Very good.  The patient also has a multifactorial gait disorder and a left frontal lobe stroke would explain some clumsiness on the right. He drives still,    (MM) Aaron Berg is a 80 year old male with a history of essential tremor, dizziness and frequent falls. He returns today for follow-up.He was seen by Dr.  Athar who determined that his tremor was in fact an essential tremor and not a feature of parkinsonism. He states that he has been having dizziness since his prior surgery and rehab. According to the prior notes he has had a thorough workup for dizziness but has there has been no identifiable causes. He states that when the dizziness happens if he holds onto something it helps to resolve it. He states that in the dark it is very severe. He  walks with a cane to stabilize him when the dizziness occurs. In the past he has had frequent falls. He states that at his home he usually has to hold on to the wall or furniture to keep from falling if he doesn't use his cane.                                                                                                                                                                                                                                                                                                                                                                              HISTORY 08/15/14 (SA):saw your patient, Aaron Berg, upon your kind request in my clinic today for second opinion of his tremors, concern for parkinsonism. The patient is unaccompanied today. As you know, Aaron Berg is a 80 year old right-handed gentleman with an underlying complex medical history of hyperlipidemia, alcohol abuse in the past, obesity, arthritis, hypertension, heart disease, status post CABG, congestive heart failure, atrial fibrillation, skin cancer, depression, COPD, osteoarthritis, status post right total knee arthroplasty in January 2015, status post left total knee arthroplasty in February 2015, history of cardioversion, multiple other surgeries including right shoulder replacement surgery, detached retina repair, cataract surgeries, angioplasty and CABG  four-vessel in 2003, tonsillectomy and appendectomy, memory loss, and obstructive sleep apnea on CPAP, who has had an upper extremity tremor for the past several years, perhaps as long as 10 years. He had seen Dr. Erling Berg many years ago and was told he had essential tremor.  He reports problems with his balance and he has dizziness for years. He missed his appointment with me yesterday, as he fell down the lower steps of his motor home and could not get up. He had his phone with him and called EMS.  Of note, he has been on Prozac for the past several months. He  has fallen multiple times since his TKAs. He has had some LE edema. He has a history of excessive alcohol use for years and usually has about 4 oz of liquor. He lives alone, has 2 grown daughters, who want him to get a call alert button. He is looking into it.     HISTORY 08/09/14 (CD): Aaron Berg is a 80 y.o.caucasian, right handed male , who is seen here as a referral from Dr. Redmond Berg for "dizziness" .  Aaron Berg has seen other specialists for the workup of dizziness which has been describing as has been described as waxing and waning but it never seems to completely go away. He has a previous history of benign positional vertigo but his current dizziness is not related to the position of the hand. Extensive neurologic workup in the past was negative he actually describes different types of dizziness that he had over the last several years.  Dizziness seems to be a prominent and permanent part of his life and there is no identifiable trigger. It can occur anytime of the day- it's not related to meal times, is not related to the extent of sleep he may have gotten the night before. The patient had a surgery in January and February of this year replacing both knees. He has noted high in the for sleep since her surgeries and states that he craves 10-12 hours of sleep per day. He has been treated in the past for low testosterone levels, by a cream- which he often forgets.  He has noted that he is completely lost of in the dark, he cannot orient himself, needs optic/ visual control, if standing in a dark room . He has a tendency to pro pulse- and fell into the baisin in front of him.  A visiting nurse worked with him on the blue platform, he fell frequently while attended.  He lost proprioception. He was still in rehab,with his hypersomnia was note by the caretakers. He went on to outpatient PT, now completed. He has another surgery lined up, fusion in lumbar spine. Myelogram was positive for stenosis , he  reported.   He has OSA, diagnosed 18 years ago and is using CPAP since. Followed by Dr. Redmond Berg, tested at [facility] long  He has been told by Dr Aaron Berg 10 years ago, that he has essential, familiar tremor and assigned to Dr Aaron Berg.  He has noted delayed work finding , names mostly. He needs to keep a calendar for appointments.      REVIEW OF SYSTEMS: Full 14 system review of systems performed and notable only for:  Memory loss, gait instability, hearing loss, he lives alone with a pet cat in a RV.  Depression. Tremor.    PAST MEDICAL HISTORY: Past Medical History  Diagnosis Date  . CAD (coronary artery disease) of bypass graft   . Dyslipidemia  well controlled  . Obesity   . Arthritis   . Male hypogonadism   . Gait disorder   . Hypertension   . Persistent atrial fibrillation ([place]) 2003  . Sleep apnea     cpap  . CHF (congestive heart failure) ([place])   . Dry eyes   . Complication of anesthesia     "woke up too soon"  . Depression   . COPD (chronic obstructive pulmonary disease) ([place])     "no signs or tests"  . Headache(784.0)     silent migraines- no pain just go blind for just a minute  . Cancer ([place])     skin  . Memory loss, short term 2/15    per daughter since last surgery and placement at [facility].  "Hallucinations have stopped"  . Sleep disorder   . Multiple falls 08/09/2014  . Chronic systolic dysfunction of left ventricle   . Cerebrovascular disease 2006    multiple ischemic changes on prior MRI  . Sleep apnea     on CPAP  . Atrial flutter ([place])   . Hypertensive cardiovascular disease     PAST SURGICAL HISTORY: Past Surgical History  Procedure Laterality Date  . Lumbar cyst      Dr. Joya Berg  . Shoulder surgery      Right with murphy, reattach ligaments  . Video assisted thoracoscopy (vats)/thorocotomy  2003    benign nodule  . Joint replacement      right shoulder replaced  . Coronary angioplasty  1991    with stents placed  . Eye surgery Bilateral      cataract surgeries  . Eye surgery Right     detached retina  . Tonsillectomy  as child  . Appendectomy  as child  . Mohs surgery      on chest  . Total knee arthroplasty Right 01/02/2014    Procedure: RIGHT TOTAL KNEE ARTHROPLASTY;  Surgeon: Mauri Pole, MD;  Location: [facility];  Service: Orthopedics;  Laterality: Right;  . Total knee arthroplasty Left 01/31/2014    Procedure: LEFT TOTAL KNEE ARTHROPLASTY;  Surgeon: Mauri Pole, MD;  Location: [facility];  Service: Orthopedics;  Laterality: Left;  . Cardioversion N/A 04/26/2014    Procedure: CARDIOVERSION;  Surgeon: Sinclair Grooms, MD;  Location: [facility];  Service: Cardiovascular;  Laterality: N/A;  . Cardioversion N/A 05/24/2014    Procedure: CARDIOVERSION;  Surgeon: Sinclair Grooms, MD;  Location: [facility] ENDOSCOPY;  Service: Cardiovascular;  Laterality: N/A;  . Coronary artery bypass graft  2004    x 4      PHYSICAL EXAM  Filed Vitals:   05/29/16 1322  BP: 124/76  Pulse: 70  Resp: 20  Height: 5\' 8"  (1.727 m)  Weight: 200 lb (90.719 kg)   Body mass index is 30.42 kg/(m^2).  Generalized: Well developed, in no acute distress  The patient's neck circumference is 17-1/2 inches, he does not have dentures, Mallampati grade 3, natural teeth, mild retrognathia,  MMSE - Mini Mental State Exam 05/29/2016 03/31/2016  Orientation to time 4 5  Orientation to Place 4 5  Registration 3 3  Attention/ Calculation 5 1  Recall 2 3  Language- name 2 objects 2 2  Language- repeat 1 1  Language- follow 3 step command 3 3  Language- read & follow direction 1 1  Write a sentence 1 1  Copy design 0 1  Total score 26 26     Montreal Cognitive Assessment  03/31/2016  Visuospatial/ Executive (0/5) 4  Naming (0/3) 3  Attention: Read list of digits (0/2) 1  Attention: Read list of letters (0/1) 1  Attention: Serial 7 subtraction starting at 100 (0/3) 1  Language: Repeat phrase (0/2) 0  Language : Fluency (0/1) 1  Abstraction (0/2) 2    Delayed Recall (0/5) 0  Orientation (0/6) 6  Total 19  Adjusted Score (based on education) 19     Neurological examination  Mentation: Alert oriented to time, place, history taking. Follows all commands speech and language fluent Cranial nerve :  Patient reports no loss of taste or smell. Pupils were equal round reactive to light. Extraocular movements were full, visual field were full on confrontational test. Facial sensation and strength were normal. Head turning and shoulder shrug  were normal and symmetric. Motor: The motor testing reveals 5 / 5 strength of all 4 extremities and  symmetric motor tone is noted throughout.  Sensory: Sensory testing is intact to soft touch on all 4 extremities. No evidence of extinction is noted.  Coordination: Cerebellar testing reveals good finger-nose-finger and heel-to-shin bilaterally.  Gait and station: uses a cane when ambulating. Slightly wide based. Gait is slightly unsteady. Tandem gait not attempted. Romberg is negative. No drift is seen.  Reflexes: Deep tendon reflexes are symmetric and normal bilaterally.    DIAGNOSTIC DATA (LABS, IMAGING, TESTING) - I reviewed patient records, labs, notes, testing and imaging myself where available.  Lab Results  Component Value Date   WBC 8.2 01/09/2016   HGB 14.6 01/09/2016   HCT 44.6 01/09/2016   MCV 96.5 01/09/2016   PLT 122* 01/09/2016   Lab Results  Component Value Date   HGBA1C 6.1* 08/15/2014   No results found for: VITAMINB12 Lab Results  Component Value Date   TSH 1.501 01/09/2016      ASSESSMENT AND PLAN Established [facility] patient, who was send again by Dr Aaron Berg , This time for memory evaluation and sleep apnea . 80 y.o. year old male  has a past medical history of CAD (coronary artery disease) of bypass graft; Dyslipidemia; Obesity; Arthritis; Male hypogonadism; Gait disorder; Hypertension; Persistent atrial fibrillation ([place]) (2003); Sleep apnea; CHF (congestive heart failure)  ([place]); Dry eyes; Complication of anesthesia; Depression; COPD (chronic obstructive pulmonary disease) ([place]); Headache(784.0); Cancer ([place]); Memory loss, short term (2/15); Sleep disorder; Multiple falls (08/09/2014); Chronic systolic dysfunction of left ventricle; Cerebrovascular disease (2006); Sleep apnea; Atrial flutter ([place]); and Hypertensive cardiovascular disease. here with:  1) untreated OSA : Aaron Berg was given a new CPAP and according to him without much instruction, and the new CPAP has barely been used.  Medicare therefore considered him noncompliant. My concern is that a patient with memory loss may need more assistance to become a compliant CPAP user especially if confronted with new headgear. My additional question is if the patient still has apnea and his daughter mentioned that he lost about 50 pounds since his last CPAP study took place.   2) early dementia: In addition we performed today to memory tests, one is a Folstein Mini-Mental exam in which she scored 26 out of 30 points - I agreed with starting Aricept and continues on the dose to 10 mg .    3) Tremor, gait ; Patient has been evaluated 2015 by Dr. Rexene Berg who ruled out Parkinson's Disorder and will continue to follow his gait instability.  Over the years he has had a thorough work-up for dizziness, a blocked up feeling in both ears, hearing loss -  without finding an etiology.   He does have balance and gait instability. He has had several falls in the past. I think the patient would benefit from physical therapy for gait and balance training. He has had a stroke in the left frontal lobe and significant small vessel disease. He has atrophy, dominantly in bilateral fronto temporal area. =vascular dementia and/ or  early Alzheimer's.   The patient will follow-up in 6 months with NP  or sooner if needed. [place] and MMSE, please  Tykeisha Peer, MD  05/29/2016, 1:45 PM [facility] [address] [place],  [address] [phone]

## 2016-05-30 DIAGNOSIS — R296 Repeated falls: Secondary | ICD-10-CM | POA: Diagnosis not present

## 2016-05-30 DIAGNOSIS — I4891 Unspecified atrial fibrillation: Secondary | ICD-10-CM | POA: Diagnosis not present

## 2016-06-03 ENCOUNTER — Ambulatory Visit: Payer: Self-pay

## 2016-06-03 DIAGNOSIS — I4891 Unspecified atrial fibrillation: Secondary | ICD-10-CM | POA: Diagnosis not present

## 2016-06-03 DIAGNOSIS — R296 Repeated falls: Secondary | ICD-10-CM | POA: Diagnosis not present

## 2016-06-05 ENCOUNTER — Telehealth: Payer: Self-pay | Admitting: Neurology

## 2016-06-05 DIAGNOSIS — R296 Repeated falls: Secondary | ICD-10-CM | POA: Diagnosis not present

## 2016-06-05 DIAGNOSIS — I4891 Unspecified atrial fibrillation: Secondary | ICD-10-CM | POA: Diagnosis not present

## 2016-06-05 NOTE — Telephone Encounter (Signed)
Aaron Berg [phone] called said pt is asking for home health speech therapy to address his cognition. Please call

## 2016-06-05 NOTE — Telephone Encounter (Signed)
Are you ok with this? If so, I will place order and call back.

## 2016-06-06 ENCOUNTER — Other Ambulatory Visit: Payer: Self-pay

## 2016-06-06 NOTE — Telephone Encounter (Signed)
i am OK with this request- can you have Dr Rexene Alberts sign the order for me ? CD

## 2016-06-06 NOTE — Patient Outreach (Signed)
[facility]) Care Management  06/06/2016  Aaron Berg 02/25/1936 [id number]   Unsuccessful attempt to reach patient.  Unable to leave message- received a fast busy signal. RN will make another attempt to reach patient within 10 working days.  [name], RN, MSN [place] [phone] Fax [phone]

## 2016-06-09 DIAGNOSIS — R296 Repeated falls: Secondary | ICD-10-CM | POA: Diagnosis not present

## 2016-06-09 DIAGNOSIS — I4891 Unspecified atrial fibrillation: Secondary | ICD-10-CM | POA: Diagnosis not present

## 2016-06-09 NOTE — Telephone Encounter (Signed)
I called Anda Kraft back. No answer, left a message asking her to call me back.

## 2016-06-10 DIAGNOSIS — R296 Repeated falls: Secondary | ICD-10-CM | POA: Diagnosis not present

## 2016-06-10 DIAGNOSIS — I4891 Unspecified atrial fibrillation: Secondary | ICD-10-CM | POA: Diagnosis not present

## 2016-06-10 NOTE — Telephone Encounter (Signed)
LM on Aaron Berg's VM with verbal orders. Left call back number if any questions.

## 2016-06-11 ENCOUNTER — Telehealth: Payer: Self-pay

## 2016-06-11 ENCOUNTER — Encounter: Payer: Self-pay | Admitting: Neurology

## 2016-06-11 DIAGNOSIS — I4891 Unspecified atrial fibrillation: Secondary | ICD-10-CM | POA: Diagnosis not present

## 2016-06-11 DIAGNOSIS — R296 Repeated falls: Secondary | ICD-10-CM | POA: Diagnosis not present

## 2016-06-11 MED ORDER — DONEPEZIL HCL 23 MG PO TABS: 23.0000 mg | ORAL_TABLET | Freq: Every day | ORAL | Status: DC

## 2016-06-11 NOTE — Addendum Note (Signed)
Addended by: Margette Fast on: 06/11/2016 03:48 PM   Modules accepted: Orders, Medications

## 2016-06-11 NOTE — Telephone Encounter (Signed)
A prescription for the 23 mg Aricept will be sent in.

## 2016-06-11 NOTE — Telephone Encounter (Signed)
Received this notification from pt's daughter, [name] (on [place]). Pt is currently taking aricept 10mg  every morning.  "Hi, this is [name], Makay Scheidt daughter and healthcare POA. We just saw Dr. Brett Fairy on June 15th, and dad seemed stable with his mini memory test, but I wondered if his Aricept could be increased a bit, please. Dad and I have noticed that his short-term memory has become worse in the past two weeks. He is concerned and asked me to communicate this request with you. If you agree to increase his Aricept dose, will you please do it through [facility]? Thank you!"   I have sent this request to the [facility] for the afternoon of 06/11/16.

## 2016-06-11 NOTE — Telephone Encounter (Signed)
Patient's daughter is calling back stating Aricept 23mg  long-acting tablet would be fine for the patient. Please call to [facility].

## 2016-06-11 NOTE — Telephone Encounter (Signed)
I called and left a message. The Aricept dose, 23 mg long-acting tablet, could go up to this if they desire, they are to let me know, and I'll be happy to call in a prescription.

## 2016-06-12 ENCOUNTER — Other Ambulatory Visit: Payer: Self-pay

## 2016-06-12 DIAGNOSIS — R296 Repeated falls: Secondary | ICD-10-CM | POA: Diagnosis not present

## 2016-06-12 DIAGNOSIS — I4891 Unspecified atrial fibrillation: Secondary | ICD-10-CM | POA: Diagnosis not present

## 2016-06-12 NOTE — Patient Outreach (Signed)
[facility]) Care Management  06/12/2016  Aaron Berg [birth year] [id number]   Second unsuccessful attempt to reach patient.  Called daughter [name] [facility] and emergency contact), but also was unable to reach her.  HIPAA appropriate message left requesting call back.  If no response RN will make another attempt to reach patient within 2 weeks.  [name], RN, MSN [place] [phone] Fax [phone]

## 2016-06-13 ENCOUNTER — Other Ambulatory Visit: Payer: Self-pay | Admitting: Family Medicine

## 2016-06-13 ENCOUNTER — Other Ambulatory Visit: Payer: Self-pay | Admitting: Interventional Cardiology

## 2016-06-16 DIAGNOSIS — R296 Repeated falls: Secondary | ICD-10-CM | POA: Diagnosis not present

## 2016-06-16 DIAGNOSIS — I4891 Unspecified atrial fibrillation: Secondary | ICD-10-CM | POA: Diagnosis not present

## 2016-06-16 NOTE — Telephone Encounter (Signed)
Is this okay to refill? 

## 2016-06-18 ENCOUNTER — Encounter: Payer: Self-pay | Admitting: Family Medicine

## 2016-06-18 ENCOUNTER — Other Ambulatory Visit: Payer: Self-pay

## 2016-06-18 MED ORDER — BUPROPION HCL ER (SR) 150 MG PO TB12
150.0000 mg | ORAL_TABLET | Freq: Two times a day (BID) | ORAL | Status: DC
Start: 2016-06-18 — End: 2016-06-18

## 2016-06-18 MED ORDER — BUPROPION HCL ER (SR) 150 MG PO TB12
150.0000 mg | ORAL_TABLET | Freq: Two times a day (BID) | ORAL | Status: DC
Start: 2016-06-18 — End: 2016-08-25

## 2016-06-19 ENCOUNTER — Other Ambulatory Visit: Payer: Self-pay

## 2016-06-19 ENCOUNTER — Other Ambulatory Visit: Payer: Self-pay | Admitting: Interventional Cardiology

## 2016-06-19 DIAGNOSIS — R296 Repeated falls: Secondary | ICD-10-CM | POA: Diagnosis not present

## 2016-06-19 DIAGNOSIS — I4891 Unspecified atrial fibrillation: Secondary | ICD-10-CM | POA: Diagnosis not present

## 2016-06-19 NOTE — Patient Outreach (Signed)
[facility]) Care Management  06/19/2016  Aaron Berg 22-May-1936 [id number]  Third unsuccessful attempt to reach patient.  HIPAA appropriate message left requesting call back.  RN will send letter requesting contact if patient wishes to continue [facility] services. If no response within 10 days, case will be closed.  [name], RN, MSN [place] [phone] Fax [phone]

## 2016-06-24 DIAGNOSIS — R296 Repeated falls: Secondary | ICD-10-CM | POA: Diagnosis not present

## 2016-06-24 DIAGNOSIS — I4891 Unspecified atrial fibrillation: Secondary | ICD-10-CM | POA: Diagnosis not present

## 2016-06-26 DIAGNOSIS — I4891 Unspecified atrial fibrillation: Secondary | ICD-10-CM | POA: Diagnosis not present

## 2016-06-26 DIAGNOSIS — R296 Repeated falls: Secondary | ICD-10-CM | POA: Diagnosis not present

## 2016-06-30 ENCOUNTER — Telehealth: Payer: Self-pay | Admitting: Family Medicine

## 2016-06-30 DIAGNOSIS — I4891 Unspecified atrial fibrillation: Secondary | ICD-10-CM | POA: Diagnosis not present

## 2016-06-30 DIAGNOSIS — R296 Repeated falls: Secondary | ICD-10-CM | POA: Diagnosis not present

## 2016-06-30 NOTE — Telephone Encounter (Signed)
PT [facility] called and is discharges him, PT can be reached at [phone]

## 2016-07-02 DIAGNOSIS — N390 Urinary tract infection, site not specified: Secondary | ICD-10-CM | POA: Diagnosis not present

## 2016-07-02 DIAGNOSIS — R296 Repeated falls: Secondary | ICD-10-CM | POA: Diagnosis not present

## 2016-07-02 DIAGNOSIS — I4891 Unspecified atrial fibrillation: Secondary | ICD-10-CM | POA: Diagnosis not present

## 2016-07-07 DIAGNOSIS — R296 Repeated falls: Secondary | ICD-10-CM | POA: Diagnosis not present

## 2016-07-07 DIAGNOSIS — I4891 Unspecified atrial fibrillation: Secondary | ICD-10-CM | POA: Diagnosis not present

## 2016-07-08 DIAGNOSIS — R296 Repeated falls: Secondary | ICD-10-CM | POA: Diagnosis not present

## 2016-07-08 DIAGNOSIS — I4891 Unspecified atrial fibrillation: Secondary | ICD-10-CM | POA: Diagnosis not present

## 2016-07-11 ENCOUNTER — Encounter: Payer: Self-pay | Admitting: Family Medicine

## 2016-07-14 DIAGNOSIS — R296 Repeated falls: Secondary | ICD-10-CM | POA: Diagnosis not present

## 2016-07-14 DIAGNOSIS — I4891 Unspecified atrial fibrillation: Secondary | ICD-10-CM | POA: Diagnosis not present

## 2016-07-17 ENCOUNTER — Ambulatory Visit: Payer: Medicare Other | Admitting: Family Medicine

## 2016-07-18 DIAGNOSIS — R296 Repeated falls: Secondary | ICD-10-CM | POA: Diagnosis not present

## 2016-07-18 DIAGNOSIS — I4891 Unspecified atrial fibrillation: Secondary | ICD-10-CM | POA: Diagnosis not present

## 2016-07-21 DIAGNOSIS — R296 Repeated falls: Secondary | ICD-10-CM | POA: Diagnosis not present

## 2016-07-21 DIAGNOSIS — I4891 Unspecified atrial fibrillation: Secondary | ICD-10-CM | POA: Diagnosis not present

## 2016-07-23 DIAGNOSIS — F0390 Unspecified dementia without behavioral disturbance: Secondary | ICD-10-CM | POA: Diagnosis not present

## 2016-07-23 DIAGNOSIS — R2681 Unsteadiness on feet: Secondary | ICD-10-CM | POA: Diagnosis not present

## 2016-07-24 DIAGNOSIS — R2681 Unsteadiness on feet: Secondary | ICD-10-CM | POA: Diagnosis not present

## 2016-07-24 DIAGNOSIS — F0390 Unspecified dementia without behavioral disturbance: Secondary | ICD-10-CM | POA: Diagnosis not present

## 2016-07-28 DIAGNOSIS — R2681 Unsteadiness on feet: Secondary | ICD-10-CM | POA: Diagnosis not present

## 2016-07-28 DIAGNOSIS — F0390 Unspecified dementia without behavioral disturbance: Secondary | ICD-10-CM | POA: Diagnosis not present

## 2016-07-29 DIAGNOSIS — F0390 Unspecified dementia without behavioral disturbance: Secondary | ICD-10-CM | POA: Diagnosis not present

## 2016-07-29 DIAGNOSIS — R2681 Unsteadiness on feet: Secondary | ICD-10-CM | POA: Diagnosis not present

## 2016-07-30 DIAGNOSIS — F0390 Unspecified dementia without behavioral disturbance: Secondary | ICD-10-CM | POA: Diagnosis not present

## 2016-07-30 DIAGNOSIS — R2681 Unsteadiness on feet: Secondary | ICD-10-CM | POA: Diagnosis not present

## 2016-07-31 DIAGNOSIS — R2681 Unsteadiness on feet: Secondary | ICD-10-CM | POA: Diagnosis not present

## 2016-07-31 DIAGNOSIS — F0390 Unspecified dementia without behavioral disturbance: Secondary | ICD-10-CM | POA: Diagnosis not present

## 2016-08-04 DIAGNOSIS — F0390 Unspecified dementia without behavioral disturbance: Secondary | ICD-10-CM | POA: Diagnosis not present

## 2016-08-04 DIAGNOSIS — R2681 Unsteadiness on feet: Secondary | ICD-10-CM | POA: Diagnosis not present

## 2016-08-05 ENCOUNTER — Encounter: Payer: Self-pay | Admitting: Family Medicine

## 2016-08-06 ENCOUNTER — Telehealth: Payer: Self-pay | Admitting: Interventional Cardiology

## 2016-08-06 DIAGNOSIS — F0390 Unspecified dementia without behavioral disturbance: Secondary | ICD-10-CM | POA: Diagnosis not present

## 2016-08-06 DIAGNOSIS — R2681 Unsteadiness on feet: Secondary | ICD-10-CM | POA: Diagnosis not present

## 2016-08-06 NOTE — Telephone Encounter (Signed)
New message      FYI     Nurse states that the pt is says that he has a sharp pain in his left hip and leg on yesterday which has subsided. Pt is also experiencing chest pain. For the last couple of weeks he has had a dull headache. Pt had an MRI and it was good. Vitals are good.

## 2016-08-08 DIAGNOSIS — R2681 Unsteadiness on feet: Secondary | ICD-10-CM | POA: Diagnosis not present

## 2016-08-08 DIAGNOSIS — F0390 Unspecified dementia without behavioral disturbance: Secondary | ICD-10-CM | POA: Diagnosis not present

## 2016-08-11 DIAGNOSIS — F0390 Unspecified dementia without behavioral disturbance: Secondary | ICD-10-CM | POA: Diagnosis not present

## 2016-08-11 DIAGNOSIS — R2681 Unsteadiness on feet: Secondary | ICD-10-CM | POA: Diagnosis not present

## 2016-08-12 DIAGNOSIS — H35372 Puckering of macula, left eye: Secondary | ICD-10-CM | POA: Diagnosis not present

## 2016-08-15 DIAGNOSIS — F0390 Unspecified dementia without behavioral disturbance: Secondary | ICD-10-CM | POA: Diagnosis not present

## 2016-08-15 DIAGNOSIS — R2681 Unsteadiness on feet: Secondary | ICD-10-CM | POA: Diagnosis not present

## 2016-08-19 DIAGNOSIS — R2681 Unsteadiness on feet: Secondary | ICD-10-CM | POA: Diagnosis not present

## 2016-08-19 DIAGNOSIS — F0390 Unspecified dementia without behavioral disturbance: Secondary | ICD-10-CM | POA: Diagnosis not present

## 2016-08-20 DIAGNOSIS — R2681 Unsteadiness on feet: Secondary | ICD-10-CM | POA: Diagnosis not present

## 2016-08-20 DIAGNOSIS — F0390 Unspecified dementia without behavioral disturbance: Secondary | ICD-10-CM | POA: Diagnosis not present

## 2016-08-25 ENCOUNTER — Other Ambulatory Visit: Payer: Self-pay | Admitting: Family Medicine

## 2016-08-25 DIAGNOSIS — R2681 Unsteadiness on feet: Secondary | ICD-10-CM | POA: Diagnosis not present

## 2016-08-25 DIAGNOSIS — F0390 Unspecified dementia without behavioral disturbance: Secondary | ICD-10-CM | POA: Diagnosis not present

## 2016-08-26 NOTE — Telephone Encounter (Signed)
Is this okay to refill? 

## 2016-08-27 ENCOUNTER — Encounter: Payer: Self-pay | Admitting: Internal Medicine

## 2016-08-27 ENCOUNTER — Other Ambulatory Visit: Payer: Self-pay | Admitting: Neurosurgery

## 2016-08-27 DIAGNOSIS — M4316 Spondylolisthesis, lumbar region: Secondary | ICD-10-CM

## 2016-08-28 DIAGNOSIS — F0390 Unspecified dementia without behavioral disturbance: Secondary | ICD-10-CM | POA: Diagnosis not present

## 2016-08-28 DIAGNOSIS — R2681 Unsteadiness on feet: Secondary | ICD-10-CM | POA: Diagnosis not present

## 2016-09-01 DIAGNOSIS — F0390 Unspecified dementia without behavioral disturbance: Secondary | ICD-10-CM | POA: Diagnosis not present

## 2016-09-01 DIAGNOSIS — R2681 Unsteadiness on feet: Secondary | ICD-10-CM | POA: Diagnosis not present

## 2016-09-02 ENCOUNTER — Telehealth: Payer: Self-pay

## 2016-09-02 DIAGNOSIS — R2681 Unsteadiness on feet: Secondary | ICD-10-CM | POA: Diagnosis not present

## 2016-09-02 DIAGNOSIS — F0390 Unspecified dementia without behavioral disturbance: Secondary | ICD-10-CM | POA: Diagnosis not present

## 2016-09-02 NOTE — Telephone Encounter (Signed)
Aaron Berg with [facility] reports that pt has become confused and is falling. She is wanting to extend services for 2 weeks for medication compliance. Pt also noted with a burn on his finger and nurse wanted to know if she can dress it?  She was not with pt at time of call.  [phone]

## 2016-09-03 DIAGNOSIS — F0390 Unspecified dementia without behavioral disturbance: Secondary | ICD-10-CM | POA: Diagnosis not present

## 2016-09-03 DIAGNOSIS — R2681 Unsteadiness on feet: Secondary | ICD-10-CM | POA: Diagnosis not present

## 2016-09-05 ENCOUNTER — Ambulatory Visit
Admission: RE | Admit: 2016-09-05 | Discharge: 2016-09-05 | Disposition: A | Discharge status: Home / Self Care | Payer: Medicare Other | Source: Ambulatory Visit | Attending: Neurosurgery | Admitting: Neurosurgery

## 2016-09-05 DIAGNOSIS — M4316 Spondylolisthesis, lumbar region: Secondary | ICD-10-CM

## 2016-09-05 DIAGNOSIS — M47817 Spondylosis without myelopathy or radiculopathy, lumbosacral region: Secondary | ICD-10-CM | POA: Diagnosis not present

## 2016-09-05 MED ORDER — IOPAMIDOL (ISOVUE-M 200) INJECTION 41%
1.0000 mL | Freq: Once | INTRAMUSCULAR | Status: AC
  Administered 2016-09-05: 1 mL via EPIDURAL

## 2016-09-05 MED ORDER — METHYLPREDNISOLONE ACETATE 40 MG/ML INJ SUSP (RADIOLOG
120.0000 mg | Freq: Once | INTRAMUSCULAR | Status: AC
  Administered 2016-09-05: 120 mg via EPIDURAL

## 2016-09-05 NOTE — Discharge Instructions (Signed)

## 2016-09-10 DIAGNOSIS — R2681 Unsteadiness on feet: Secondary | ICD-10-CM | POA: Diagnosis not present

## 2016-09-10 DIAGNOSIS — F0390 Unspecified dementia without behavioral disturbance: Secondary | ICD-10-CM | POA: Diagnosis not present

## 2016-09-11 ENCOUNTER — Encounter: Payer: Self-pay | Admitting: Family Medicine

## 2016-09-11 ENCOUNTER — Ambulatory Visit (INDEPENDENT_AMBULATORY_CARE_PROVIDER_SITE_OTHER): Payer: Medicare Other | Admitting: Family Medicine

## 2016-09-11 VITALS — BP 100/58 | HR 55 | Ht 68.0 in | Wt 210.0 lb

## 2016-09-11 DIAGNOSIS — M545 Low back pain, unspecified: Secondary | ICD-10-CM

## 2016-09-11 DIAGNOSIS — I10 Essential (primary) hypertension: Secondary | ICD-10-CM

## 2016-09-11 DIAGNOSIS — J309 Allergic rhinitis, unspecified: Secondary | ICD-10-CM | POA: Insufficient documentation

## 2016-09-11 DIAGNOSIS — Z23 Encounter for immunization: Secondary | ICD-10-CM | POA: Diagnosis not present

## 2016-09-11 DIAGNOSIS — G4730 Sleep apnea, unspecified: Secondary | ICD-10-CM

## 2016-09-11 DIAGNOSIS — R413 Other amnesia: Secondary | ICD-10-CM

## 2016-09-11 DIAGNOSIS — F329 Major depressive disorder, single episode, unspecified: Secondary | ICD-10-CM

## 2016-09-11 DIAGNOSIS — I2510 Atherosclerotic heart disease of native coronary artery without angina pectoris: Secondary | ICD-10-CM

## 2016-09-11 DIAGNOSIS — R296 Repeated falls: Secondary | ICD-10-CM | POA: Diagnosis not present

## 2016-09-11 DIAGNOSIS — Z96653 Presence of artificial knee joint, bilateral: Secondary | ICD-10-CM

## 2016-09-11 DIAGNOSIS — G300 Alzheimer's disease with early onset: Secondary | ICD-10-CM

## 2016-09-11 DIAGNOSIS — G4733 Obstructive sleep apnea (adult) (pediatric): Secondary | ICD-10-CM | POA: Diagnosis not present

## 2016-09-11 DIAGNOSIS — I480 Paroxysmal atrial fibrillation: Secondary | ICD-10-CM

## 2016-09-11 DIAGNOSIS — R2689 Other abnormalities of gait and mobility: Secondary | ICD-10-CM | POA: Diagnosis not present

## 2016-09-11 DIAGNOSIS — G8929 Other chronic pain: Secondary | ICD-10-CM

## 2016-09-11 DIAGNOSIS — F32A Depression, unspecified: Secondary | ICD-10-CM

## 2016-09-11 DIAGNOSIS — F0280 Dementia in other diseases classified elsewhere without behavioral disturbance: Secondary | ICD-10-CM

## 2016-09-11 NOTE — Patient Instructions (Addendum)
Get back on your CPAP and if you have a problem with the equipment call your equipment company, [facility] [employer]. Talk to [employer] and [employer].Call if further problems Keep the appointment with Dr. Joya Salm and Dr. Brett Fairy

## 2016-09-11 NOTE — Progress Notes (Signed)
   Subjective:    Patient ID: Aaron Berg, male    DOB: 16-Mar-1936, 80 y.o.   MRN: [id number]  HPI He is here for a follow-up visit. His main complaint today is rhinorrhea and postnasal drainage. Presently he is living at [facility] and plans to move to the [facility] home. He is getting physical therapy as well as occupational therapy and has made improvement with this. He has not fallen recently. He has had both knees replaced. He seems to be doing well with this. He has a history of mild cognitive impairment and both he and his daughter recognizes he has improved considerably. There is also an underlying history of depression and presently he is on Wellbutrin and Prozac. He recognizes that this has also helped. He continues on Aricept. He has also been having a lot of difficulty with back pain and recently had an epidural injection. He will follow-up with Dr. Joya Salm concerning this. He will also discussed this with his neurologist. He notes increased difficulty with physical impairment as well as his sensation in his legs. He has an underlying history of ASHD and PAF. He has seen Dr. Daneen Schick in the past for this. Presently he is having no chest pain, shortness of breath. He continues on chronic anticoagulation. He does have underlying OSA and has not been using his CPAP for the last month. During the same timeframe he has noted increased difficulty with daytime sleepiness. He had difficulty with the mask but did not call concerning changing this.  Review of Systems     Objective:   Physical Exam Alert and in no distress with much more positive affect. Cardiac exam shows a grade 1 murmur. Lungs are clear to auscultation. Nasal mucosa is slightly pinkish. No tenderness over sinuses.       Assessment & Plan:  Essential hypertension, benign  ASHD (arteriosclerotic heart disease)  Depression  Sleep apnea  Multiple falls  PAF (paroxysmal atrial fibrillation) ([place])  Need for  prophylactic vaccination and inoculation against influenza - Plan: Flu vaccine HIGH DOSE PF (Fluzone High dose)  OSA (obstructive sleep apnea)  Early onset Alzheimer's dementia without behavioral disturbance  Memory impairment of gradual onset  Multifactorial gait disorder  Allergic rhinitis, unspecified allergic rhinitis type He will continue on his present medication regimen and discuss adjustment with his neurologist concerning his neurologic issues. He is to call [facility] concerning getting proper equipment for his CPAP. Continue to follow-up with Dr. Tamala Julian concerning his cardiac issues. He is scheduled to see Dr. Joya Salm as well as neurology. I encouraged him to become even more physically active. He seems to have a much better attitude towards everything. He is also to use Rhinocort for his allergies and Claritin and call me if he has further difficulties with that. Over 45 minutes, greater than 50% spent in counseling and coordination of care.

## 2016-09-12 DIAGNOSIS — R2681 Unsteadiness on feet: Secondary | ICD-10-CM | POA: Diagnosis not present

## 2016-09-12 DIAGNOSIS — F0390 Unspecified dementia without behavioral disturbance: Secondary | ICD-10-CM | POA: Diagnosis not present

## 2016-09-17 ENCOUNTER — Encounter: Payer: Self-pay | Admitting: Neurology

## 2016-09-17 ENCOUNTER — Ambulatory Visit (INDEPENDENT_AMBULATORY_CARE_PROVIDER_SITE_OTHER): Payer: Medicare Other | Admitting: Neurology

## 2016-09-17 VITALS — BP 158/82 | HR 60 | Resp 20 | Ht 68.0 in | Wt 203.0 lb

## 2016-09-17 DIAGNOSIS — H9110 Presbycusis, unspecified ear: Secondary | ICD-10-CM | POA: Diagnosis not present

## 2016-09-17 DIAGNOSIS — G250 Essential tremor: Secondary | ICD-10-CM | POA: Insufficient documentation

## 2016-09-17 DIAGNOSIS — I2510 Atherosclerotic heart disease of native coronary artery without angina pectoris: Secondary | ICD-10-CM | POA: Diagnosis not present

## 2016-09-17 NOTE — Progress Notes (Signed)
PATIENT: Aaron Berg DOB: [birth year]  REASON FOR VISIT:  [facility] ?  Dr Rexene Alberts has seen this patient.    Started on Aricept by PCP, has been non compliant with CPAP- has never been followed here for OSA.   HISTORY FROM: patient  HISTORY OF PRESENT ILLNESS:  Dr. Jill Alexanders referred his established patient Aaron Berg for a re-evaluation today. The patient was just seen by Dr. Joya Salm for back issues, Dr. Redmond School noted that the patient has an underlying history of atrial fibrillation, gait instability, obstructive sleep apnea but presently is not using CPAP also he has been given instructions.  He does have memory difficulties and had been worked up for dizziness and multiple falls as well as gait instability with Dr. Rexene Alberts,  who diagnosed him with essential tremor.  Apparently he has fallen 3 times within the months of March. He continues to take Prozac and Wellbutrin. And Mini-Mental Status Examination at Dr. Lanice Shirts office performed on April 3rd established 22 points only. Dr. Redmond School feels that the untreated sleep apnea may contribute to a worsening of cognitive function. He was given a new CPAP by [facility] in February 2017, was scheduled for CPAP training but he lives alone and couldn't master the set up, thus he was deemed non compliant after 60 days- he will be charged for the machine or return it.  One complication is that the patient was not tested for sleep apnea. Last psg was approximately 10 years ago. He is now almost 50 pounds lighter than he was at the time of his last PSG. The additional concern of memory impairment was also evaluated in today's visit. The patient scored 26 out of 30 points on the Mini-Mental Status Examination.  This was followed by a Montral cognitive assessment in whichshe scored 19 out of 30. Given that the patient is hard of hearing this may have affected some of the testing results however 19 out of 30 points would be considered early dementia and not mild  cognitive impairment.He is unsure about the presence of atrial fibrillation now,  but had it in the past. The treatment of OSA, if present, would be a priority in atrial fibrillation. He insists that he doesn't snore. He lives alone.   15th of June 2017, Aaron Berg and is here in the presence of his daughter. His most recent neurologic diagnostic studies included an MRI of the brain without contrast. This was performed on 04/23/2016 and showed progressive moderate diffuse cerebral and cerebellar atrophy which affects more the temporal and parietal lobes. There is also progressive moderate white matter disease a sign of chronic microvascular ischemia. In comparison to a study from 2006 there was a new stroke noted but this is still a remote stroke with scoliosis, and ischemic cortical infarct in the anterior left frontal lobe. No other acute abnormalities were noted. The study was read by Dr. San Morelle. He has clearly evidence of Alzheimer's dementia.  The patient underwent a memory test today this was a Mini-Mental Status Examination. 27/30 ! Very good.  The patient also has a multifactorial gait disorder and a left frontal lobe stroke would explain some clumsiness on the right. He drives still,    (MM) Aaron Berg is a 80 year old male patient of Dr Guadelupe Sabin with a history of essential tremor, dizziness and frequent falls. He returns today for follow-up.He was seen by Dr. Rexene Alberts who determined that his tremor was in fact an essential tremor and not a  feature of parkinsonism. He states that he has been having dizziness since his prior surgery and rehab. According to the prior notes he has had a thorough workup for dizziness but has there has been no identifiable causes. He states that when the dizziness happens if he holds onto something it helps to resolve it. He states that in the dark it is very severe. He walks with a cane to stabilize him when the dizziness occurs. In the past he has had frequent  falls. He states that at his home he usually has to hold on to the wall or furniture to keep from falling if he doesn't use his cane.                                                                                                                                                                                                                                                        He had seen Dr. Erling Cruz many years ago and was told he had essential tremor.  He reports problems with his balance and he has dizziness for years. He missed his appointment with me yesterday, as he fell down the lower steps of his motor home and could not get up. He had his phone with him and called EMS.  Of note, he has been on Prozac for the past several months. He has fallen multiple times since his TKAs. He has had some LE edema. He has a history of excessive alcohol use for years and usually has about 4 oz of liquor. He lives alone, has 2 grown daughters, who want him to get a call alert button. He is looking into it.   I had the pleasure of seeing Aaron Berg today for routine revisit on 09/17/2016. Aaron Berg has a history of essential tremors, gait disorder multifactorial, arthritis age-related, rhinitis due to allergies, and depression. He is followed here today for cognitive assessment. The Montral cognitive assessment was chosen today and he scored 27 out of 30 points. He is no longer using as much alcohol maybe drinks 1 beer every other week or so. He ran out of multivitamins and has not replaced them. Tremor has been increasing , but is still essential. He is awaiting a fusion at l 3-4 , had nerve root  block without pain control. He is in  PT, nurse Visits.   We have discussed today the possibilities of treating the higher amplitude essential tremor and its progressive nature with medication. I am not thrilled of using topiramate due to cognitive side effects. Mysoline is very sedating, a beta blocker may cause fainting. He is  already on Toprol-XL for congestive heart failure and rate control. Heart rate is now running in the 50s. I suggested a vibrating wrist raise or weighted wrist braces to suppress the tremor.     REVIEW OF SYSTEMS: Full 14 system review of systems performed and notable only for:  Memory loss, gait instability, hearing loss, he lives alone with a pet cat - he lives independent at [place]. Depression. Tremor.    PAST MEDICAL HISTORY: Past Medical History:  Diagnosis Date  . Arthritis   . Atrial flutter ([place])   . CAD (coronary artery disease) of bypass graft   . Cancer ([place])    skin  . Cerebrovascular disease 2006   multiple ischemic changes on prior MRI  . CHF (congestive heart failure) ([place])   . Chronic systolic dysfunction of left ventricle   . Complication of anesthesia    "woke up too soon"  . COPD (chronic obstructive pulmonary disease) ([place])    "no signs or tests"  . Depression   . Dry eyes   . Dyslipidemia    well controlled  . Gait disorder   . Headache(784.0)    silent migraines- no pain just go blind for just a minute  . Hypertension   . Hypertensive cardiovascular disease   . Male hypogonadism   . Memory loss, short term 2/15   per daughter since last surgery and placement at [facility].  "Hallucinations have stopped"  . Multiple falls 08/09/2014  . Obesity   . Persistent atrial fibrillation ([place]) 2003  . Sleep apnea    cpap  . Sleep apnea    on CPAP  . Sleep disorder     PAST SURGICAL HISTORY: Past Surgical History:  Procedure Laterality Date  . APPENDECTOMY  as child  . CARDIOVERSION N/A 04/26/2014   Procedure: CARDIOVERSION;  Surgeon: Sinclair Grooms, MD;  Location: [facility] ENDOSCOPY;  Service: Cardiovascular;  Laterality: N/A;  . CARDIOVERSION N/A 05/24/2014   Procedure: CARDIOVERSION;  Surgeon: Sinclair Grooms, MD;  Location: [facility];  Service: Cardiovascular;  Laterality: N/A;  . [place]   with stents placed  . CORONARY  ARTERY BYPASS GRAFT  2004   x 4  . EYE SURGERY Bilateral    cataract surgeries  . EYE SURGERY Right    detached retina  . JOINT REPLACEMENT     right shoulder replaced  . lumbar cyst     Dr. Joya Salm  . MOHS SURGERY     on chest  . SHOULDER SURGERY     Right with murphy, reattach ligaments  . TONSILLECTOMY  as child  . TOTAL KNEE ARTHROPLASTY Right 01/02/2014   Procedure: RIGHT TOTAL KNEE ARTHROPLASTY;  Surgeon: Mauri Pole, MD;  Location: [facility] ORS;  Service: Orthopedics;  Laterality: Right;  . TOTAL KNEE ARTHROPLASTY Left 01/31/2014   Procedure: LEFT TOTAL KNEE ARTHROPLASTY;  Surgeon: Mauri Pole, MD;  Location: [facility] ORS;  Service: Orthopedics;  Laterality: Left;  [name] VIDEO ASSISTED THORACOSCOPY (VATS)/THOROCOTOMY  2003   benign nodule      PHYSICAL EXAM  Vitals:   09/17/16 1102  BP: (!) 158/82  Pulse: 60  Resp: 20  Weight: 203 lb (92.1 kg)  Height: 5\' 8"  (1.727 m)   Body mass index is 30.87 kg/m.  Generalized: Well developed, in no acute distress  The patient's neck circumference is 17-1/2 inches, he does not have dentures, Mallampati grade 3, natural teeth, mild retrognathia,  MMSE - Mini Mental State Exam 05/29/2016 03/31/2016  Orientation to time 4 5  Orientation to Place 4 5  Registration 3 3  Attention/ Calculation 5 1  Recall 2 3  Language- name 2 objects 2 2  Language- repeat 1 1  Language- follow 3 step command 3 3  Language- read & follow direction 1 1  Write a sentence 1 1  Copy design 0 1  Total score 26 26     Montreal Cognitive Assessment  09/17/2016 03/31/2016  Visuospatial/ Executive (0/5) 5 4  Naming (0/3) 3 3  Attention: Read list of digits (0/2) 2 1  Attention: Read list of letters (0/1) 1 1  Attention: Serial 7 subtraction starting at 100 (0/3) 3 1  Language: Repeat phrase (0/2) 1 0  Language : Fluency (0/1) 0 1  Abstraction (0/2) 2 2  Delayed Recall (0/5) 4 0  Orientation (0/6) 6 6  Total 27 19  Adjusted Score (based on education) 27 19      Neurological examination  Mentation: Alert oriented to time, place, history taking. Follows all commands speech and language fluent Cranial nerve :  Patient reports no loss of taste or smell. Pupils were equal round reactive to light. Extraocular movements were full, visual field were full on confrontational test. Facial sensation and strength were normal. Head turning and shoulder shrug  were normal and symmetric. Motor: The motor testing reveals 5 / 5 strength of all 4 extremities and  symmetric motor tone is noted throughout.  Sensory: Sensory testing is intact to soft touch on all 4 extremities. No evidence of extinction is noted.  Coordination: Cerebellar testing reveals good finger-nose-finger and heel-to-shin bilaterally.  Gait and station: uses a cane when ambulating. Slightly wide based. Gait is slightly unsteady. Tandem gait not attempted. Romberg is negative. No drift is seen.  Reflexes: Deep tendon reflexes are symmetric and normal bilaterally.    DIAGNOSTIC DATA (LABS, IMAGING, TESTING) - I reviewed patient records, labs, notes, testing and imaging myself where available.  Lab Results  Component Value Date   WBC 8.2 01/09/2016   HGB 14.6 01/09/2016   HCT 44.6 01/09/2016   MCV 96.5 01/09/2016   PLT 122 (L) 01/09/2016   Lab Results  Component Value Date   HGBA1C 6.1 (H) 08/15/2014   No results found for: VITAMINB12 Lab Results  Component Value Date   TSH 1.501 01/09/2016      ASSESSMENT AND PLAN Established [facility] patient, who was send again by Dr Redmond School , This time for memory evaluation and sleep apnea. Patient refused Sleep test.   Has CPAP , non compliant.    Cognitive impairment - MOCA 27-30 -  started Aricept and continues on the dose to 10 mg .   Essential Tremor,  He does have balance and gait instability. He has had several falls in the past. I think the patient would benefit from physical therapy for gait and balance training.   He has had a stroke  in the left frontal lobe and significant small vessel disease. He has atrophy, dominantly in bilateral fronto temporal area. =vascular dementia and/ or  early Alzheimer's.   The patient will follow-up in 12 months with NP . MOCA , please    Tamiya Colello, MD  09/17/2016, 11:17 AM [facility] [address] [place], [place] [address] [phone]

## 2016-09-17 NOTE — Patient Instructions (Signed)
Essential Tremor A tremor is trembling or shaking that you cannot control. Most tremors affect the hands or arms. Tremors can also affect the head, vocal cords, face, and other parts of the body.  Essential tremor is a tremor without a known cause.  CAUSES Essential tremor has no known cause.  RISK FACTORS You may be at greater risk of essential tremor if:   You have a family member with essential tremor.   You are age 78 or older.   You take certain medicines. SIGNS AND SYMPTOMS The main sign of a tremor is uncontrolled and unintentional rhythmic shaking of a body part.  You may have difficulty eating with a spoon or fork.   You may have difficulty writing.   You may nod your head up and down or side to side.   You may have a quivering voice.  Your tremors:  May get worse over time.   May come and go.   May be more noticeable on one side of your body.   May get worse due to stress, fatigue, caffeine, and extreme heat or cold.  DIAGNOSIS Your health care provider can diagnose essential tremor based on your symptoms, medical history, and a physical examination. There is no single test to diagnose an essential tremor. However, your health care provider may perform a variety of tests to rule out other conditions. Tests may include:   Blood and urine tests.   Imaging studies of your brain, such as:   CT scan.   MRI.   A test that measures involuntary muscle movement (electromyogram). TREATMENT Your tremors may go away without treatment. Mild tremors may not need treatment if they do not affect your day-to-day life. Severe tremors may need to be treated using one or a combination of the following options:   Medicines. This may include medicine that is injected.  Lifestyle changes.   Physical therapy.  HOME CARE INSTRUCTIONS  Take medicines only as directed by your health care provider.   Limit alcohol intake to no more than 1 drink per day for  nonpregnant women and 2 drinks per day for men. One drink equals 12 oz of beer, 5 oz of wine, or 1 oz of hard liquor.  Do not use any tobacco products, including cigarettes, chewing tobacco, or electronic cigarettes. If you need help quitting, ask your health care provider.  Take medicines only as directed by your health care provider.   Avoid extreme heat or cold.   Limit the amount of caffeine you consumeas directed by your health care provider.   Try to get eight hours of sleep each night.  Find ways to manage your stress, such as meditation or yoga.  Keep all follow-up visits as directed by your health care provider. This is important. This includes any physical therapy visits. SEEK MEDICAL CARE IF:  You experience any changes in the location or intensity of your tremors.   You start having a tremor after starting a new medicine.   You have tremor with other symptoms such as:   Numbness.   Tingling.   Pain.   Weakness.   Your tremor gets worse.   Your tremor interferes with your daily life.    This information is not intended to replace advice given to you by your health care provider. Make sure you discuss any questions you have with your health care provider.   Document Released: 12/22/2014 Document Reviewed: 12/22/2014 Elsevier Interactive Patient Education 2016 [place] Strategies for Tremors  When eating, try the following  Eat out of bowls, divided plates, or use a plate guard (available at a medical supply store) and eat with a spoon so that you have an edge to scoop up food.  Try raising your plate so that there is less distance between the plate and mouth.Try stabilizing elbows on the tables or against your body.  Use utensil with built-up/larger grips as they are easier to hold.  When writing, try the following:  Stabilize forearm on the table.  Take your time as rushing/being stressed can increase tremors.  Try a  felt-tipped pen, it does not glide as much.  Avoid gel pens ( they move to much ).  Consider using pre-printed labels with your name and address (carry them with you when you go out) or you can get stamps with your address or signature on it.  Use a small tape recorder to record messages/reminders for yourself.  Use pens with bigger grips.  When brushing your teeth, putting on make-up, or styling hair, try the following:  Use an electric toothbrush.  Use items with built-up grips.  Stabilize your elbows against your body or on the counter.  Use long-handled brushes/combs.  Use a hair dryer with a stand.  In general:  Avoid stress, fatigue or rushing as this can increase tremors.  Sit down for activities that require more control/coordination.  Perform "flicks".

## 2016-09-18 DIAGNOSIS — F0390 Unspecified dementia without behavioral disturbance: Secondary | ICD-10-CM | POA: Diagnosis not present

## 2016-09-18 DIAGNOSIS — H04123 Dry eye syndrome of bilateral lacrimal glands: Secondary | ICD-10-CM | POA: Diagnosis not present

## 2016-09-18 DIAGNOSIS — R2681 Unsteadiness on feet: Secondary | ICD-10-CM | POA: Diagnosis not present

## 2016-09-19 DIAGNOSIS — F0390 Unspecified dementia without behavioral disturbance: Secondary | ICD-10-CM | POA: Diagnosis not present

## 2016-09-19 DIAGNOSIS — R2681 Unsteadiness on feet: Secondary | ICD-10-CM | POA: Diagnosis not present

## 2016-09-21 DIAGNOSIS — R251 Tremor, unspecified: Secondary | ICD-10-CM | POA: Diagnosis not present

## 2016-09-21 DIAGNOSIS — F0390 Unspecified dementia without behavioral disturbance: Secondary | ICD-10-CM | POA: Diagnosis not present

## 2016-09-23 DIAGNOSIS — F0390 Unspecified dementia without behavioral disturbance: Secondary | ICD-10-CM | POA: Diagnosis not present

## 2016-09-23 DIAGNOSIS — R251 Tremor, unspecified: Secondary | ICD-10-CM | POA: Diagnosis not present

## 2016-09-24 ENCOUNTER — Telehealth: Payer: Self-pay

## 2016-09-24 DIAGNOSIS — F0390 Unspecified dementia without behavioral disturbance: Secondary | ICD-10-CM | POA: Diagnosis not present

## 2016-09-24 DIAGNOSIS — R251 Tremor, unspecified: Secondary | ICD-10-CM | POA: Diagnosis not present

## 2016-09-24 NOTE — Telephone Encounter (Signed)
Make sure that he gets checked tomorrow so that we know he is okay

## 2016-09-24 NOTE — Telephone Encounter (Signed)
[name] a nurse with [facility] called to inform us Mr.Camera had fallen about 3 hrs ago and hit his head he has a raised place on his head vitals are 140/90 and are stable she just wanted to let you know just incase you would like to see him

## 2016-09-26 DIAGNOSIS — F0390 Unspecified dementia without behavioral disturbance: Secondary | ICD-10-CM | POA: Diagnosis not present

## 2016-09-26 DIAGNOSIS — R251 Tremor, unspecified: Secondary | ICD-10-CM | POA: Diagnosis not present

## 2016-09-29 DIAGNOSIS — F0390 Unspecified dementia without behavioral disturbance: Secondary | ICD-10-CM | POA: Diagnosis not present

## 2016-09-29 DIAGNOSIS — R251 Tremor, unspecified: Secondary | ICD-10-CM | POA: Diagnosis not present

## 2016-10-01 ENCOUNTER — Telehealth: Payer: Self-pay | Admitting: Family Medicine

## 2016-10-01 DIAGNOSIS — R251 Tremor, unspecified: Secondary | ICD-10-CM | POA: Diagnosis not present

## 2016-10-01 DIAGNOSIS — F0390 Unspecified dementia without behavioral disturbance: Secondary | ICD-10-CM | POA: Diagnosis not present

## 2016-10-01 NOTE — Telephone Encounter (Signed)
Aaron Berg with [facility] home health called and stated pt is having issues with restless leg syndrome. She states it is interrupting his sleep. She would like some to be considered to help him. Please call Juliann Pulse at [phone].

## 2016-10-01 NOTE — Telephone Encounter (Signed)
He is going to need an office visit

## 2016-10-02 NOTE — Telephone Encounter (Signed)
Left message for Aaron Berg and called Aaron Berg left message for him to make appointment

## 2016-10-05 DIAGNOSIS — F419 Anxiety disorder, unspecified: Secondary | ICD-10-CM | POA: Diagnosis not present

## 2016-10-06 ENCOUNTER — Observation Stay (HOSPITAL_COMMUNITY)
Admission: EM | Admit: 2016-10-06 | Discharge: 2016-10-08 | Disposition: A | Discharge status: Home / Self Care | Payer: Medicare Other | Attending: Internal Medicine | Admitting: Internal Medicine

## 2016-10-06 ENCOUNTER — Emergency Department (HOSPITAL_COMMUNITY): Payer: Medicare Other

## 2016-10-06 ENCOUNTER — Encounter (HOSPITAL_COMMUNITY): Payer: Self-pay

## 2016-10-06 DIAGNOSIS — G4733 Obstructive sleep apnea (adult) (pediatric): Secondary | ICD-10-CM | POA: Insufficient documentation

## 2016-10-06 DIAGNOSIS — I700 Atherosclerosis of aorta: Secondary | ICD-10-CM | POA: Insufficient documentation

## 2016-10-06 DIAGNOSIS — R072 Precordial pain: Principal | ICD-10-CM | POA: Insufficient documentation

## 2016-10-06 DIAGNOSIS — I4581 Long QT syndrome: Secondary | ICD-10-CM | POA: Insufficient documentation

## 2016-10-06 DIAGNOSIS — M19042 Primary osteoarthritis, left hand: Secondary | ICD-10-CM | POA: Insufficient documentation

## 2016-10-06 DIAGNOSIS — I480 Paroxysmal atrial fibrillation: Secondary | ICD-10-CM | POA: Insufficient documentation

## 2016-10-06 DIAGNOSIS — I350 Nonrheumatic aortic (valve) stenosis: Secondary | ICD-10-CM | POA: Diagnosis not present

## 2016-10-06 DIAGNOSIS — R079 Chest pain, unspecified: Secondary | ICD-10-CM | POA: Diagnosis present

## 2016-10-06 DIAGNOSIS — M19041 Primary osteoarthritis, right hand: Secondary | ICD-10-CM | POA: Diagnosis not present

## 2016-10-06 DIAGNOSIS — M19032 Primary osteoarthritis, left wrist: Secondary | ICD-10-CM | POA: Diagnosis not present

## 2016-10-06 DIAGNOSIS — Z9889 Other specified postprocedural states: Secondary | ICD-10-CM | POA: Insufficient documentation

## 2016-10-06 DIAGNOSIS — I481 Persistent atrial fibrillation: Secondary | ICD-10-CM | POA: Diagnosis not present

## 2016-10-06 DIAGNOSIS — Z8249 Family history of ischemic heart disease and other diseases of the circulatory system: Secondary | ICD-10-CM | POA: Insufficient documentation

## 2016-10-06 DIAGNOSIS — Z85828 Personal history of other malignant neoplasm of skin: Secondary | ICD-10-CM | POA: Insufficient documentation

## 2016-10-06 DIAGNOSIS — Z888 Allergy status to other drugs, medicaments and biological substances status: Secondary | ICD-10-CM | POA: Insufficient documentation

## 2016-10-06 DIAGNOSIS — J449 Chronic obstructive pulmonary disease, unspecified: Secondary | ICD-10-CM | POA: Insufficient documentation

## 2016-10-06 DIAGNOSIS — M19031 Primary osteoarthritis, right wrist: Secondary | ICD-10-CM | POA: Insufficient documentation

## 2016-10-06 DIAGNOSIS — I10 Essential (primary) hypertension: Secondary | ICD-10-CM | POA: Diagnosis present

## 2016-10-06 DIAGNOSIS — Z8673 Personal history of transient ischemic attack (TIA), and cerebral infarction without residual deficits: Secondary | ICD-10-CM | POA: Insufficient documentation

## 2016-10-06 DIAGNOSIS — I2510 Atherosclerotic heart disease of native coronary artery without angina pectoris: Secondary | ICD-10-CM | POA: Insufficient documentation

## 2016-10-06 DIAGNOSIS — I5042 Chronic combined systolic (congestive) and diastolic (congestive) heart failure: Secondary | ICD-10-CM | POA: Diagnosis not present

## 2016-10-06 DIAGNOSIS — I252 Old myocardial infarction: Secondary | ICD-10-CM | POA: Diagnosis not present

## 2016-10-06 DIAGNOSIS — D696 Thrombocytopenia, unspecified: Secondary | ICD-10-CM | POA: Diagnosis not present

## 2016-10-06 DIAGNOSIS — E669 Obesity, unspecified: Secondary | ICD-10-CM | POA: Insufficient documentation

## 2016-10-06 DIAGNOSIS — D649 Anemia, unspecified: Secondary | ICD-10-CM | POA: Insufficient documentation

## 2016-10-06 DIAGNOSIS — Z96653 Presence of artificial knee joint, bilateral: Secondary | ICD-10-CM | POA: Insufficient documentation

## 2016-10-06 DIAGNOSIS — Z87891 Personal history of nicotine dependence: Secondary | ICD-10-CM | POA: Insufficient documentation

## 2016-10-06 DIAGNOSIS — F329 Major depressive disorder, single episode, unspecified: Secondary | ICD-10-CM | POA: Diagnosis not present

## 2016-10-06 DIAGNOSIS — E291 Testicular hypofunction: Secondary | ICD-10-CM | POA: Diagnosis not present

## 2016-10-06 DIAGNOSIS — R011 Cardiac murmur, unspecified: Secondary | ICD-10-CM | POA: Insufficient documentation

## 2016-10-06 DIAGNOSIS — Z951 Presence of aortocoronary bypass graft: Secondary | ICD-10-CM | POA: Insufficient documentation

## 2016-10-06 DIAGNOSIS — F0150 Vascular dementia without behavioral disturbance: Secondary | ICD-10-CM | POA: Insufficient documentation

## 2016-10-06 DIAGNOSIS — Z7982 Long term (current) use of aspirin: Secondary | ICD-10-CM | POA: Insufficient documentation

## 2016-10-06 DIAGNOSIS — I110 Hypertensive heart disease with heart failure: Secondary | ICD-10-CM | POA: Insufficient documentation

## 2016-10-06 DIAGNOSIS — Z6830 Body mass index (BMI) 30.0-30.9, adult: Secondary | ICD-10-CM | POA: Insufficient documentation

## 2016-10-06 DIAGNOSIS — R296 Repeated falls: Secondary | ICD-10-CM | POA: Insufficient documentation

## 2016-10-06 DIAGNOSIS — Q211 Atrial septal defect: Secondary | ICD-10-CM | POA: Insufficient documentation

## 2016-10-06 DIAGNOSIS — Z7901 Long term (current) use of anticoagulants: Secondary | ICD-10-CM | POA: Insufficient documentation

## 2016-10-06 DIAGNOSIS — R0789 Other chest pain: Secondary | ICD-10-CM | POA: Diagnosis not present

## 2016-10-06 HISTORY — DX: Cardiac murmur, unspecified: R01.1

## 2016-10-06 HISTORY — DX: Low back pain: M54.5

## 2016-10-06 HISTORY — DX: Spinal stenosis, site unspecified: M48.00

## 2016-10-06 HISTORY — DX: Unspecified malignant neoplasm of skin of other part of trunk: C44.509

## 2016-10-06 HISTORY — DX: Dependence on other enabling machines and devices: Z99.89

## 2016-10-06 HISTORY — DX: Other chronic pain: G89.29

## 2016-10-06 HISTORY — DX: Low back pain, unspecified: M54.50

## 2016-10-06 HISTORY — DX: Migraine, unspecified, not intractable, without status migrainosus: G43.909

## 2016-10-06 HISTORY — DX: Obstructive sleep apnea (adult) (pediatric): G47.33

## 2016-10-06 LAB — COMPREHENSIVE METABOLIC PANEL
ALBUMIN: 3.4 g/dL — AB (ref 3.5–5.0)
ALK PHOS: 43 U/L (ref 38–126)
ALT: 23 U/L (ref 17–63)
AST: 27 U/L (ref 15–41)
Anion gap: 7 (ref 5–15)
BILIRUBIN TOTAL: 0.7 mg/dL (ref 0.3–1.2)
BUN: 23 mg/dL — AB (ref 6–20)
CALCIUM: 9.8 mg/dL (ref 8.9–10.3)
CO2: 25 mmol/L (ref 22–32)
CREATININE: 1.09 mg/dL (ref 0.61–1.24)
Chloride: 108 mmol/L (ref 101–111)
GFR calc Af Amer: >60 mL/min (ref >60)
GLUCOSE: 85 mg/dL (ref 65–99)
Potassium: 3.9 mmol/L (ref 3.5–5.1)
Sodium: 140 mmol/L (ref 135–145)
TOTAL PROTEIN: 5.7 g/dL — AB (ref 6.5–8.1)

## 2016-10-06 LAB — CBC WITH DIFFERENTIAL/PLATELET
Basophils Absolute: 0 10*3/uL (ref 0.0–0.1)
Basophils Relative: 0 %
EOS ABS: 0.1 10*3/uL (ref 0.0–0.7)
Eosinophils Relative: 2 %
HCT: 38.9 % — ABNORMAL LOW (ref 39.0–52.0)
HEMOGLOBIN: 12.9 g/dL — AB (ref 13.0–17.0)
LYMPHS ABS: 1.2 10*3/uL (ref 0.7–4.0)
LYMPHS PCT: 23 %
MCH: 31.2 pg (ref 26.0–34.0)
MCHC: 33.2 g/dL (ref 30.0–36.0)
MCV: 94.2 fL (ref 78.0–100.0)
MONOS PCT: 10 %
Monocytes Absolute: 0.5 10*3/uL (ref 0.1–1.0)
NEUTROS PCT: 65 %
Neutro Abs: 3.3 10*3/uL (ref 1.7–7.7)
Platelets: 125 10*3/uL — ABNORMAL LOW (ref 150–400)
RBC: 4.13 MIL/uL — AB (ref 4.22–5.81)
RDW: 14 % (ref 11.5–15.5)
WBC: 5.1 10*3/uL (ref 4.0–10.5)

## 2016-10-06 LAB — I-STAT TROPONIN, ED: Troponin i, poc: 0.01 ng/mL (ref 0.00–0.08)

## 2016-10-06 MED ORDER — BUPROPION HCL ER (SR) 150 MG PO TB12
150.0000 mg | ORAL_TABLET | Freq: Two times a day (BID) | ORAL | Status: DC
  Administered 2016-10-06 – 2016-10-08 (×4): 150 mg via ORAL
  Filled 2016-10-06 (×5): qty 1

## 2016-10-06 MED ORDER — VITAMIN B-1 100 MG PO TABS
100.0000 mg | ORAL_TABLET | Freq: Every day | ORAL | Status: DC
  Administered 2016-10-07 – 2016-10-08 (×2): 100 mg via ORAL
  Filled 2016-10-06 (×2): qty 1

## 2016-10-06 MED ORDER — HEPARIN (PORCINE) IN NACL 100-0.45 UNIT/ML-% IJ SOLN
1400.0000 [IU]/h | INTRAMUSCULAR | Status: DC
  Administered 2016-10-06: 1400 [IU]/h via INTRAVENOUS
  Filled 2016-10-06: qty 250

## 2016-10-06 MED ORDER — DONEPEZIL HCL 23 MG PO TABS
23.0000 mg | ORAL_TABLET | Freq: Every day | ORAL | Status: DC
  Administered 2016-10-06 – 2016-10-07 (×2): 23 mg via ORAL
  Filled 2016-10-06 (×2): qty 1

## 2016-10-06 MED ORDER — METOPROLOL SUCCINATE ER 25 MG PO TB24
25.0000 mg | ORAL_TABLET | Freq: Every day | ORAL | Status: DC
  Administered 2016-10-07: 25 mg via ORAL
  Filled 2016-10-06: qty 1

## 2016-10-06 MED ORDER — LORAZEPAM 2 MG/ML IJ SOLN: 1.0000 mg | Freq: Four times a day (QID) | INTRAMUSCULAR | Status: DC | PRN

## 2016-10-06 MED ORDER — LORAZEPAM 1 MG PO TABS: 1.0000 mg | ORAL_TABLET | Freq: Four times a day (QID) | ORAL | Status: DC | PRN

## 2016-10-06 MED ORDER — ASPIRIN EC 325 MG PO TBEC
325.0000 mg | DELAYED_RELEASE_TABLET | Freq: Every day | ORAL | Status: DC
  Administered 2016-10-07 – 2016-10-08 (×2): 325 mg via ORAL
  Filled 2016-10-06 (×2): qty 1

## 2016-10-06 MED ORDER — FOLIC ACID 1 MG PO TABS
1.0000 mg | ORAL_TABLET | Freq: Every day | ORAL | Status: DC
  Administered 2016-10-07 – 2016-10-08 (×2): 1 mg via ORAL
  Filled 2016-10-06 (×2): qty 1

## 2016-10-06 MED ORDER — ADULT MULTIVITAMIN W/MINERALS CH
1.0000 | ORAL_TABLET | Freq: Every day | ORAL | Status: DC
  Administered 2016-10-07 – 2016-10-08 (×2): 1 via ORAL
  Filled 2016-10-06 (×2): qty 1

## 2016-10-06 MED ORDER — ONDANSETRON HCL 4 MG/2ML IJ SOLN: 4.0000 mg | Freq: Four times a day (QID) | INTRAMUSCULAR | Status: DC | PRN

## 2016-10-06 MED ORDER — LORATADINE 10 MG PO TABS
10.0000 mg | ORAL_TABLET | Freq: Every day | ORAL | Status: DC
  Administered 2016-10-07 – 2016-10-08 (×2): 10 mg via ORAL
  Filled 2016-10-06 (×2): qty 1

## 2016-10-06 MED ORDER — ROSUVASTATIN CALCIUM 10 MG PO TABS
40.0000 mg | ORAL_TABLET | Freq: Every day | ORAL | Status: DC
  Administered 2016-10-07: 40 mg via ORAL
  Filled 2016-10-06: qty 4

## 2016-10-06 MED ORDER — FLUOXETINE HCL 10 MG PO CAPS
10.0000 mg | ORAL_CAPSULE | Freq: Every day | ORAL | Status: DC
  Administered 2016-10-07 – 2016-10-08 (×2): 10 mg via ORAL
  Filled 2016-10-06 (×2): qty 1

## 2016-10-06 MED ORDER — ENSURE ENLIVE PO LIQD
237.0000 mL | Freq: Two times a day (BID) | ORAL | Status: DC
  Administered 2016-10-07 (×2): 237 mL via ORAL

## 2016-10-06 MED ORDER — ACETAMINOPHEN 325 MG PO TABS: 650.0000 mg | ORAL_TABLET | ORAL | Status: DC | PRN

## 2016-10-06 MED ORDER — MULTI-VITAMIN/MINERALS PO TABS: 1.0000 | ORAL_TABLET | Freq: Every day | ORAL | Status: DC

## 2016-10-06 MED ORDER — THIAMINE HCL 100 MG/ML IJ SOLN: 100.0000 mg | Freq: Every day | INTRAMUSCULAR | Status: DC

## 2016-10-06 NOTE — ED Notes (Signed)
MD at bedside. 

## 2016-10-06 NOTE — ED Triage Notes (Signed)
BIB [facility] from [place]. Pt. Having chest discomfort and pressure since noon yesterday. Pt. Reports it has been coming and going. Hx. Of triple bypass. Uses a walker. NAD. Denies pain. EMS gave 3 aspirin in the field.

## 2016-10-06 NOTE — ED Notes (Signed)
EKG given to Dr. Delo 

## 2016-10-06 NOTE — ED Provider Notes (Signed)
[facility] DEPT Provider Note   CSN: [id number] Arrival date & time: 10/06/16  1602     History   Chief Complaint Chief Complaint  Patient presents with  . Chest Pain    HPI Aaron Berg is a 80 y.o. male with a history of CAD, status post CABG approximately 10-12 years ago, and prior CVA with residual difficulty with ambulation, who walks with a walker, CHF, multiple falls, presents to the emergency department noting a waxing and waning precordial chest pain and discomfort over the last 2 days. These episodes have occurred while at rest and lasts anywhere from a few minutes to a couple hours and spontaneously resolve. He states that at their worst they have been moderate in severity and associated with no nausea, vomiting, diaphoresis, lightheadedness, palpitations, radiation. He states that he is unsure if these symptoms are similar to that which she had prior to his previous CABG, because he states that he didn't have much pain at that time. He denies any pain currently. He was given ASA by EMS.   He states that it has been several years since his last stress testing.   HPI  Past Medical History:  Diagnosis Date  . Arthritis    "thumbs, wrists, legs" (10/06/2016)  . Atrial flutter ([place])   . CAD (coronary artery disease) of bypass graft   . Cerebrovascular disease 2006   multiple ischemic changes on prior MRI  . CHF (congestive heart failure) ([place])   . Chronic lower back pain   . Chronic systolic dysfunction of left ventricle   . Complication of anesthesia    "woke up too soon"  . COPD (chronic obstructive pulmonary disease) ([place])    "no signs or tests"  . Depression   . Dry eyes   . Dyslipidemia    well controlled  . Gait disorder   . Heart murmur    "found in ~ 1946"  . Hypertension   . Hypertensive cardiovascular disease   . Male hypogonadism   . Memory loss, short term 2/15   per daughter since last surgery and placement at [facility].  "Hallucinations have  stopped"  . Migraine    silent migraines- no pain just go blind for just a minute  . Migraine    "major one when I was a kid; very painful"  . Multiple falls    "after both knees surgeries; went to rehab; came out and fell all the time" (10/06/2016)  . Myocardial infarction 2004   "was told I'd had one that I didn't know I'd had; ended up getting OHS"  . Obesity   . OSA on CPAP    cpap  . Persistent atrial fibrillation ([place]) 2003  . Skin cancer of anterior chest   . Spinal stenosis     Patient Active Problem List   Diagnosis Date Noted  . Precordial chest pain 10/06/2016  . Chest pain 10/06/2016  . Benign essential tremor 09/17/2016  . Rhinitis, allergic 09/11/2016  . Chronic low back pain 09/11/2016  . Status post total bilateral knee replacement 09/11/2016  . White matter disease 05/29/2016  . Multifactorial gait disorder 05/29/2016  . Memory impairment of gradual onset 05/29/2016  . Tremor, essential 03/31/2016  . OSA (obstructive sleep apnea) 03/31/2016  . Late onset Alzheimer's disease without behavioral disturbance 03/31/2016  . Multiple falls 08/09/2014  . Chronic anticoagulation 06/30/2014  . PAF (paroxysmal atrial fibrillation) ([place]) 03/03/2014  . Unspecified arthropathy, lower leg 01/29/2014  . Chronic airway obstruction, not elsewhere  classified 01/29/2014  . Obesity (BMI 30-39.9) 01/04/2014  . Chronic combined systolic and diastolic CHF (congestive heart failure) ([place]) 10/11/2013  . Essential hypertension, benign 10/11/2013  . ASHD (arteriosclerotic heart disease) 07/09/2011  . Hyperlipidemia with target LDL less than 70 07/09/2011  . Sleep apnea 07/09/2011  . Hypogonadism male 07/09/2011    Past Surgical History:  Procedure Laterality Date  . APPENDECTOMY  as child  . CARDIAC CATHETERIZATION  11/2003   Archie Endo 04/29/2011  . CARDIOVERSION N/A 04/26/2014   Procedure: CARDIOVERSION;  Surgeon: Sinclair Grooms, MD;  Location: [facility] ENDOSCOPY;  Service:  Cardiovascular;  Laterality: N/A;  . CARDIOVERSION N/A 05/24/2014   Procedure: CARDIOVERSION;  Surgeon: Sinclair Grooms, MD;  Location: [place];  Service: Cardiovascular;  Laterality: N/A;  . CATARACT EXTRACTION W/ INTRAOCULAR LENS  IMPLANT, BILATERAL Bilateral 2000s  . CORONARY ANGIOPLASTY  1991   "no stents" (10/06/2016)  . CORONARY ARTERY BYPASS GRAFT  11/2002   X 3/notes 04/29/2011  . CYST EXCISION     lumbar; Dr. Joya Salm  . EYE SURGERY Right    detached retina  . JOINT REPLACEMENT    . MOHS SURGERY     on chest  . RETINAL DETACHMENT SURGERY Right   . SHOULDER SURGERY Right    Right with murphy, reattach ligaments  . TONSILLECTOMY  as child  . TOTAL KNEE ARTHROPLASTY Right 01/02/2014   Procedure: RIGHT TOTAL KNEE ARTHROPLASTY;  Surgeon: Mauri Pole, MD;  Location[facility];  Service: Orthopedics;  Laterality: Right;  . TOTAL KNEE ARTHROPLASTY Left 01/31/2014   Procedure: LEFT TOTAL KNEE ARTHROPLASTY;  Surgeon: Mauri Pole, MD;  Location: [facility];  Service: Orthopedics;  Laterality: Left;  . TOTAL SHOULDER ARTHROPLASTY Right   . VIDEO ASSISTED THORACOSCOPY (VATS)/THOROCOTOMY  2003   benign nodule       Home Medications    Prior to Admission medications   Medication Sig Start Date End Date Taking? Authorizing Provider  apixaban (ELIQUIS) 5 MG TABS tablet Take 1 tablet (5 mg total) by mouth 2 (two) times daily. 06/16/16  Yes Belva Crome, MD  buPROPion [facility] SR) 150 MG 12 hr tablet TAKE 1 TABLET TWICE A DAY 08/26/16  Yes [name], MD  cholecalciferol (VITAMIN D) 1000 UNITS tablet Take 1,000 Units by mouth daily.    Yes Historical Provider, MD  donepezil (ARICEPT) 23 MG TABS tablet Take 1 tablet (23 mg total) by mouth at bedtime. 06/11/16  Yes Kathrynn Ducking, MD  fexofenadine (ALLEGRA) 180 MG tablet Take 180 mg by mouth daily.   Yes Historical Provider, MD  FLUoxetine (PROZAC) 10 MG capsule TAKE 1 CAPSULE DAILY 08/26/16  Yes [name], MD  metoprolol  succinate (TOPROL-XL) 25 MG 24 hr tablet Take 1 tablet (25 mg total) by mouth daily. 05/27/16  Yes Belva Crome, MD  Multiple Vitamins-Minerals (MULTIVITAMIN WITH MINERALS) tablet Take 1 tablet by mouth daily. Reported on 01/30/2016   Yes Historical Provider, MD  naproxen sodium (ANAPROX) 220 MG tablet Take 220 mg by mouth 2 (two) times daily as needed (pain). Reported on 01/30/2016   Yes Historical Provider, MD  rosuvastatin (CRESTOR) 40 MG tablet TAKE 1 TABLET DAILY WITH BREAKFAST FOR HYPERLIPIDEMIA (KEEP UPCOMING APPOINTMENT IN ORDER TO RECEIVE ADDITIONAL REFILLS) 06/19/16  Yes Belva Crome, MD  sodium chloride (OCEAN) 0.65 % SOLN nasal spray Place 1 spray into both nostrils daily as needed for congestion.    Yes Historical Provider, MD  Testosterone 20.25  MG/1.25GM (1.62%) GEL Apply 2 Squirts topically daily as needed (occasionally). One pump per shoulder. 03/06/14  Yes [place], DO    Family History Family History  Problem Relation Age of Onset  . Pancreatic cancer Father   . CAD Father   . CAD Mother   . Arthritis Sister   . Healthy Sister     Social History Social History  Substance Use Topics  . Smoking status: Former Smoker    Packs/day: 2.00    Years: 33.00    Types: Cigarettes    Quit date: 06/17/1989  . Smokeless tobacco: Never Used  . Alcohol use 8.4 oz/week    14 Shots of liquor per week     Comment: 10/06/2016 "bourbon"     Allergies   Procardia [nifedipine]   Review of Systems Review of Systems  Constitutional: Negative for chills and fever.  Respiratory: Negative for cough, chest tightness and shortness of breath.   Cardiovascular: Positive for chest pain. Negative for palpitations and leg swelling.  Gastrointestinal: Negative for abdominal pain, nausea and vomiting.  Genitourinary: Negative for difficulty urinating and dysuria.  Musculoskeletal: Negative for back pain and neck pain.  Neurological: Negative for syncope, weakness, light-headedness,  numbness and headaches.  All other systems reviewed and are negative.    Physical Exam Updated Vital Signs BP (!) 146/79 (BP Location: Left Arm)   Pulse 94   Temp 98.6 F (37 C) (Oral)   Resp 18   Ht 5\' 10"  (1.778 m)   Wt 95.4 kg   SpO2 97%   BMI 30.17 kg/m   Physical Exam  Constitutional: He is oriented to person, place, and time. He appears well-developed and well-nourished. No distress.  HENT:  Head: Normocephalic and atraumatic.  Nose: Nose normal.  Mouth/Throat: Oropharynx is clear and moist.  Eyes: Conjunctivae and EOM are normal. Pupils are equal, round, and reactive to light.  Neck: Neck supple.  Cardiovascular: Normal rate, regular rhythm and intact distal pulses.   Murmur heard. Pulmonary/Chest: Effort normal and breath sounds normal. He exhibits no tenderness.  Abdominal: Soft. He exhibits no distension. There is no tenderness.  Musculoskeletal: He exhibits no edema or tenderness.  Neurological: He is alert and oriented to person, place, and time. He has normal strength. No cranial nerve deficit or sensory deficit. Coordination normal. GCS eye subscore is 4. GCS verbal subscore is 5. GCS motor subscore is 6.  Skin: Skin is warm and dry. No rash noted. He is not diaphoretic.  Nursing note and vitals reviewed.    ED Treatments / Results  Labs (all labs ordered are listed, but only abnormal results are displayed) Labs Reviewed  CBC WITH DIFFERENTIAL/PLATELET - Abnormal; Notable for the following:       Result Value   RBC 4.13 (*)    Hemoglobin 12.9 (*)    HCT 38.9 (*)    Platelets 125 (*)    All other components within normal limits  COMPREHENSIVE METABOLIC PANEL - Abnormal; Notable for the following:    BUN 23 (*)    Total Protein 5.7 (*)    Albumin 3.4 (*)    All other components within normal limits  TROPONIN I  TROPONIN I  TROPONIN I  I-STAT TROPOININ, ED    EKG  EKG Interpretation None       Radiology Dg Chest 2 View  Result Date:  10/06/2016 CLINICAL DATA:  Chest discomfort today with right-sided chest spasm. No breathing complaints. EXAM: CHEST  2 VIEW COMPARISON:  06/30/2014 FINDINGS: Old bilateral rib fractures. No acute osseous abnormality. Right shoulder arthroplasty partially visualized. The heart is normal in size. There is atherosclerosis of the aortic arch without aneurysm. Chronic mild diffuse interstitial prominence without pneumonic consolidation, effusion or pneumothorax. IMPRESSION: Chronic stable changes of the chest without acute pulmonary disease. Aortic atherosclerosis. Electronically Signed   By: Ashley Royalty M.D.   On: 10/06/2016 18:28    Procedures Procedures (including critical care time)  Medications Ordered in ED Medications  feeding supplement (ENSURE ENLIVE) (ENSURE ENLIVE) liquid 237 mL (not administered)  buPROPion (WELLBUTRIN SR) 12 hr tablet 150 mg (not administered)  FLUoxetine (PROZAC) capsule 10 mg (not administered)  rosuvastatin (CRESTOR) tablet 40 mg (not administered)  donepezil (ARICEPT) tablet 23 mg (not administered)  metoprolol succinate (TOPROL-XL) 24 hr tablet 25 mg (not administered)  loratadine (CLARITIN) tablet 10 mg (not administered)  acetaminophen (TYLENOL) tablet 650 mg (not administered)  ondansetron (ZOFRAN) injection 4 mg (not administered)  aspirin EC tablet 325 mg (not administered)  LORazepam (ATIVAN) tablet 1 mg (not administered)    Or  LORazepam (ATIVAN) injection 1 mg (not administered)  thiamine (VITAMIN B-1) tablet 100 mg (not administered)    Or  thiamine (B-1) injection 100 mg (not administered)  folic acid (FOLVITE) tablet 1 mg (not administered)  multivitamin with minerals tablet 1 tablet (not administered)     Initial Impression / Assessment and Plan / ED Course  I have reviewed the triage vital signs and the nursing notes.  Pertinent labs & imaging results that were available during my care of the patient were reviewed by me and considered in  my medical decision making (see chart for details).  Clinical Course  80 y.o. male with hx of CAD presents with precordial chest pain. EKG shows NSR with nonspecific ST depression in the lateral leads but no corresponding ST elevation. Initial troponin negative, no leukocytosis, electrolyte abnormality, normal renal function, hemoglobin 12.9. Chest x-ray was done and showed no acute findings.  Given the patient's significant cardiac history with return of symptoms concerning for possible unstable angina, the decision was made to admit for chest pain with chemical stress testing   Final Clinical Impressions(s) / ED Diagnoses   Final diagnoses:  Precordial pain    New Prescriptions Current Discharge Medication List       Zenovia Jarred, DO 10/06/16 2326    Veryl Speak, MD 10/06/16 2335

## 2016-10-06 NOTE — H&P (Signed)
History and Physical    Aaron Berg [id number] DOB: 08/15/1936 DOA: 10/06/2016  PCP: Wyatt Haste, MD  Patient coming from: Home.  Chief Complaint: Chest pain.  HPI: Aaron Berg is a 80 y.o. male with with history of CAD status post CABG, hypertension, paroxysmal atrial fibrillation, sleep apnea presents to the ER because of chest pain. Patient has been experiencing chest pain since yesterday. Chest pain happened yesterday at noontime while at rest no associated diaphoresis nausea shortness of breath productive cough. Patient called the office and was instructed to come to the ER but patient decided to stay at home. Pain eased off on going to the bed. Patient started having chest pain again this morning and decided to come to the ER. Patient is currently chest pain-free. EKG shows nonspecific findings cardiac markers were negative and patient is being admitted for further management of chest pain.   ED Course: EKG shows non-specific changes, troponin negative chest x-ray unremarkable.  Review of Systems: As per HPI, rest all negative.   Past Medical History:  Diagnosis Date  . Arthritis    "thumbs, wrists, legs" (10/06/2016)  . Atrial flutter ([place])   . CAD (coronary artery disease) of bypass graft   . Cerebrovascular disease 2006   multiple ischemic changes on prior MRI  . CHF (congestive heart failure) ([place])   . Chronic lower back pain   . Chronic systolic dysfunction of left ventricle   . Complication of anesthesia    "woke up too soon"  . COPD (chronic obstructive pulmonary disease) ([place])    "no signs or tests"  . Depression   . Dry eyes   . Dyslipidemia    well controlled  . Gait disorder   . Heart murmur    "found in ~ 1946"  . Hypertension   . Hypertensive cardiovascular disease   . Male hypogonadism   . Memory loss, short term 2/15   per daughter since last surgery and placement at [facility].  "Hallucinations have stopped"  . Migraine    silent  migraines- no pain just go blind for just a minute  . Migraine    "major one when I was a kid; very painful"  . Multiple falls    "after both knees surgeries; went to rehab; came out and fell all the time" (10/06/2016)  . Myocardial infarction 2004   "was told I'd had one that I didn't know I'd had; ended up getting OHS"  . Obesity   . OSA on CPAP    cpap  . Persistent atrial fibrillation ([place]) 2003  . Skin cancer of anterior chest   . Spinal stenosis     Past Surgical History:  Procedure Laterality Date  . APPENDECTOMY  as child  . CARDIAC CATHETERIZATION  11/2003   Archie Endo 04/29/2011  . CARDIOVERSION N/A 04/26/2014   Procedure: CARDIOVERSION;  Surgeon: Sinclair Grooms, MD;  Location: [facility] ENDOSCOPY;  Service: Cardiovascular;  Laterality: N/A;  . CARDIOVERSION N/A 05/24/2014   Procedure: CARDIOVERSION;  Surgeon: Sinclair Grooms, MD;  Location: [place];  Service: Cardiovascular;  Laterality: N/A;  . CATARACT EXTRACTION W/ INTRAOCULAR LENS  IMPLANT, BILATERAL Bilateral 2000s  . CORONARY ANGIOPLASTY  1991   "no stents" (10/06/2016)  . CORONARY ARTERY BYPASS GRAFT  11/2002   X 3/notes 04/29/2011  . CYST EXCISION     lumbar; Dr. Joya Salm  . EYE SURGERY Right    detached retina  . JOINT REPLACEMENT    . MOHS SURGERY  on chest  . RETINAL DETACHMENT SURGERY Right   . SHOULDER SURGERY Right    Right with murphy, reattach ligaments  . TONSILLECTOMY  as child  . TOTAL KNEE ARTHROPLASTY Right 01/02/2014   Procedure: RIGHT TOTAL KNEE ARTHROPLASTY;  Surgeon: Mauri Pole, MD;  [facility];  Service: Orthopedics;  Laterality: Right;  . TOTAL KNEE ARTHROPLASTY Left 01/31/2014   Procedure: LEFT TOTAL KNEE ARTHROPLASTY;  Surgeon: Mauri Pole, MD;  Location[facility];  Service: Orthopedics;  Laterality: Left;  . TOTAL SHOULDER ARTHROPLASTY Right   . VIDEO ASSISTED THORACOSCOPY (VATS)/THOROCOTOMY  2003   benign nodule     reports that he quit smoking about 27 years ago. His  smoking use included Cigarettes. He has a 66.00 pack-year smoking history. He has never used smokeless tobacco. He reports that he drinks about 8.4 oz of alcohol per week . He reports that he does not use drugs.  Allergies  Allergen Reactions  . Procardia [Nifedipine] Rash    All over body    Family History  Problem Relation Age of Onset  . Pancreatic cancer Father   . CAD Father   . CAD Mother   . Arthritis Sister   . Healthy Sister     Prior to Admission medications   Medication Sig Start Date End Date Taking? Authorizing Provider  apixaban (ELIQUIS) 5 MG TABS tablet Take 1 tablet (5 mg total) by mouth 2 (two) times daily. 06/16/16  Yes Belva Crome, MD  buPROPion [facility] SR) 150 MG 12 hr tablet TAKE 1 TABLET TWICE A DAY 08/26/16  Yes [name], MD  cholecalciferol (VITAMIN D) 1000 UNITS tablet Take 1,000 Units by mouth daily.    Yes Historical Provider, MD  donepezil (ARICEPT) 23 MG TABS tablet Take 1 tablet (23 mg total) by mouth at bedtime. 06/11/16  Yes Kathrynn Ducking, MD  fexofenadine (ALLEGRA) 180 MG tablet Take 180 mg by mouth daily.   Yes Historical Provider, MD  FLUoxetine (PROZAC) 10 MG capsule TAKE 1 CAPSULE DAILY 08/26/16  Yes [name], MD  metoprolol succinate (TOPROL-XL) 25 MG 24 hr tablet Take 1 tablet (25 mg total) by mouth daily. 05/27/16  Yes Belva Crome, MD  Multiple Vitamins-Minerals (MULTIVITAMIN WITH MINERALS) tablet Take 1 tablet by mouth daily. Reported on 01/30/2016   Yes Historical Provider, MD  naproxen sodium (ANAPROX) 220 MG tablet Take 220 mg by mouth 2 (two) times daily as needed (pain). Reported on 01/30/2016   Yes Historical Provider, MD  rosuvastatin (CRESTOR) 40 MG tablet TAKE 1 TABLET DAILY WITH BREAKFAST FOR HYPERLIPIDEMIA (KEEP UPCOMING APPOINTMENT IN ORDER TO RECEIVE ADDITIONAL REFILLS) 06/19/16  Yes Belva Crome, MD  sodium chloride (OCEAN) 0.65 % SOLN nasal spray Place 1 spray into both nostrils daily as needed for congestion.    Yes  Historical Provider, MD  Testosterone 20.25 MG/1.25GM (1.62%) GEL Apply 2 Squirts topically daily as needed (occasionally). One pump per shoulder. 03/06/14  Yes Gayland Curry, DO    Physical Exam: Vitals:   10/06/16 1745 10/06/16 1835 10/06/16 1916 10/06/16 2028  BP: 141/81 120/81 132/73 (!) 146/79  Pulse: (!) 54 (!) 54 60 94  Resp: 17 20 23 18   Temp:    98.6 F (37 C)  TempSrc:    Oral  SpO2: 93% 97% 96% 97%  Weight:    95.4 kg (210 lb 4.8 oz)  Height:    5\' 10"  (1.778 m)      Constitutional:  Moderately built and nourished. Vitals:   10/06/16 1745 10/06/16 1835 10/06/16 1916 10/06/16 2028  BP: 141/81 120/81 132/73 (!) 146/79  Pulse: (!) 54 (!) 54 60 94  Resp: 17 20 23 18   Temp:    98.6 F (37 C)  TempSrc:    Oral  SpO2: 93% 97% 96% 97%  Weight:    95.4 kg (210 lb 4.8 oz)  Height:    5\' 10"  (1.778 m)   Eyes: Anicteric no pallor. ENMT: No discharge from the ears eyes nose or mouth. Neck: No JVD appreciated no mass felt. Respiratory: No rhonchi or crepitations. Cardiovascular: S1-S2 heard no murmurs appreciated. Abdomen: Soft nontender bowel sounds present. No guarding or rigidity. Musculoskeletal: No edema or joint effusion. Skin: No rash skin appears warm. Neurologic: Alert awake oriented to time place and person. Moves all extremities 5 x 5. Psychiatric: Appears normal. Normal affect.   Labs on Admission: I have personally reviewed following labs and imaging studies  CBC:  Recent Labs Lab 10/06/16 1715  WBC 5.1  NEUTROABS 3.3  HGB 12.9*  HCT 38.9*  MCV 94.2  PLT 0000000*   Basic Metabolic Panel:  Recent Labs Lab 10/06/16 1715  NA 140  K 3.9  CL 108  CO2 25  GLUCOSE 85  BUN 23*  CREATININE 1.09  CALCIUM 9.8   GFR: Estimated Creatinine Clearance: 64.8 mL/min (by C-G formula based on SCr of 1.09 mg/dL). Liver Function Tests:  Recent Labs Lab 10/06/16 1715  AST 27  ALT 23  ALKPHOS 43  BILITOT 0.7  PROT 5.7*  ALBUMIN 3.4*   No results for  input(s): LIPASE, AMYLASE in the last 168 hours. No results for input(s): AMMONIA in the last 168 hours. Coagulation Profile: No results for input(s): INR, PROTIME in the last 168 hours. Cardiac Enzymes: No results for input(s): CKTOTAL, CKMB, CKMBINDEX, TROPONINI in the last 168 hours. BNP (last 3 results) No results for input(s): PROBNP in the last 8760 hours. HbA1C: No results for input(s): HGBA1C in the last 72 hours. CBG: No results for input(s): GLUCAP in the last 168 hours. Lipid Profile: No results for input(s): CHOL, HDL, LDLCALC, TRIG, CHOLHDL, LDLDIRECT in the last 72 hours. Thyroid Function Tests: No results for input(s): TSH, T4TOTAL, FREET4, T3FREE, THYROIDAB in the last 72 hours. Anemia Panel: No results for input(s): VITAMINB12, FOLATE, FERRITIN, TIBC, IRON, RETICCTPCT in the last 72 hours. Urine analysis:    Component Value Date/Time   COLORURINE YELLOW 01/25/2014 1344   APPEARANCEUR CLEAR 01/25/2014 1344   LABSPEC 1.024 01/25/2014 1344   PHURINE 5.5 01/25/2014 1344   GLUCOSEU NEGATIVE 01/25/2014 1344   HGBUR NEGATIVE 01/25/2014 1344   BILIRUBINUR SMALL (A) 01/25/2014 1344   KETONESUR NEGATIVE 01/25/2014 1344   PROTEINUR NEGATIVE 01/25/2014 1344   UROBILINOGEN 1.0 01/25/2014 1344   NITRITE NEGATIVE 01/25/2014 1344   LEUKOCYTESUR NEGATIVE 01/25/2014 1344   Sepsis Labs: @LABRCNTIP (procalcitonin:4,lacticidven:4) )No results found for this or any previous visit (from the past 240 hour(s)).   Radiological Exams on Admission: Dg Chest 2 View  Result Date: 10/06/2016 CLINICAL DATA:  Chest discomfort today with right-sided chest spasm. No breathing complaints. EXAM: CHEST  2 VIEW COMPARISON:  06/30/2014 FINDINGS: Old bilateral rib fractures. No acute osseous abnormality. Right shoulder arthroplasty partially visualized. The heart is normal in size. There is atherosclerosis of the aortic arch without aneurysm. Chronic mild diffuse interstitial prominence without  pneumonic consolidation, effusion or pneumothorax. IMPRESSION: Chronic stable changes of the chest without acute pulmonary disease. Aortic atherosclerosis.  Electronically Signed   By: Ashley Royalty M.D.   On: 10/06/2016 18:28    EKG: Independently reviewed. Normal sinus rhythm with PVC.  Assessment/Plan Principal Problem:   Precordial chest pain Active Problems:   Sleep apnea   Chronic combined systolic and diastolic CHF (congestive heart failure) (HCC)   Essential hypertension, benign   PAF (paroxysmal atrial fibrillation) (HCC)   Chest pain    1. Chest pain concerning for angina - patient is presently chest pain-free. We'll cycle cardiac markers check 2-D echo. Patient is on aspirin and statins and beta blocker. In anticipation of possible cardiac procedure we will hold off Apixaban and place patient on heparin. Consult cardiologist in a.m. 2. Paroxysmal atrial fibrillation - chads 2 vasc score is more than 2. Heparin at this time in anticipation of cardiac procedure and hold off apixaban. On metoprolol. 3. Hypertension on metoprolol. 4. Sleep apnea on CPAP. 5. Anemia - follow CBC and further workup as outpatient. 6. Thrombocytopenia probably secondary to alcoholism - follow CBC. Patient is on CIWA. 7. Chronic combined systolic and diastolic CHF last EF measured in 2015 was 45-50% - appears euvolemic. 8. History of stroke on anticoagulation and statins.   DVT prophylaxis: Heparin. Code Status: Full code.  Family Communication: Discussed with patient.  Disposition Plan: Home.  Consults called: None.  Admission status: Observation.    Rise Patience MD [facility] Pager [phone].  If 7PM-7AM, please contact night-coverage [email or web address] Password [email or web address]  10/06/2016, 10:55 PM

## 2016-10-06 NOTE — Progress Notes (Addendum)
ANTICOAGULATION CONSULT NOTE - Initial Consult  Pharmacy Consult for heparin Indication: atrial fibrillation  Allergies  Allergen Reactions  . Procardia [Nifedipine] Rash    All over body    Patient Measurements: Height: 5\' 10"  (177.8 cm) Weight: 210 lb 4.8 oz (95.4 kg) IBW/kg (Calculated) : 73 Heparin Dosing Weight: 95kg  Vital Signs: Temp: 98.6 F (37 C) (10/23 2028) Temp Source: Oral (10/23 2028) BP: 146/79 (10/23 2028) Pulse Rate: 94 (10/23 2028)  Labs:  Recent Labs  10/06/16 1715  HGB 12.9*  HCT 38.9*  PLT 125*  CREATININE 1.09    Estimated Creatinine Clearance: 64.8 mL/min (by C-G formula based on SCr of 1.09 mg/dL).   Medical History: Past Medical History:  Diagnosis Date  . Arthritis    "thumbs, wrists, legs" (10/06/2016)  . Atrial flutter ([place])   . CAD (coronary artery disease) of bypass graft   . Cerebrovascular disease 2006   multiple ischemic changes on prior MRI  . CHF (congestive heart failure) ([place])   . Chronic lower back pain   . Chronic systolic dysfunction of left ventricle   . Complication of anesthesia    "woke up too soon"  . COPD (chronic obstructive pulmonary disease) ([place])    "no signs or tests"  . Depression   . Dry eyes   . Dyslipidemia    well controlled  . Gait disorder   . Heart murmur    "found in ~ 1946"  . Hypertension   . Hypertensive cardiovascular disease   . Male hypogonadism   . Memory loss, short term 2/15   per daughter since last surgery and placement at [facility].  "Hallucinations have stopped"  . Migraine    silent migraines- no pain just go blind for just a minute  . Migraine    "major one when I was a kid; very painful"  . Multiple falls    "after both knees surgeries; went to rehab; came out and fell all the time" (10/06/2016)  . Myocardial infarction 2004   "was told I'd had one that I didn't know I'd had; ended up getting OHS"  . Obesity   . OSA on CPAP    cpap  . Persistent atrial fibrillation  ([place]) 2003  . Skin cancer of anterior chest   . Spinal stenosis     Medications:  Prescriptions Prior to Admission  Medication Sig Dispense Refill Last Dose  . apixaban (ELIQUIS) 5 MG TABS tablet Take 1 tablet (5 mg total) by mouth 2 (two) times daily. 180 tablet 3 10/06/2016 at 0900  . buPROPion (WELLBUTRIN SR) 150 MG 12 hr tablet TAKE 1 TABLET TWICE A DAY 180 tablet 0 10/06/2016 at Unknown time  . cholecalciferol (VITAMIN D) 1000 UNITS tablet Take 1,000 Units by mouth daily.    10/06/2016 at Unknown time  . donepezil (ARICEPT) 23 MG TABS tablet Take 1 tablet (23 mg total) by mouth at bedtime. 90 tablet 1 10/05/2016 at Unknown time  . fexofenadine (ALLEGRA) 180 MG tablet Take 180 mg by mouth daily.   10/06/2016 at Unknown time  . FLUoxetine (PROZAC) 10 MG capsule TAKE 1 CAPSULE DAILY 90 capsule 0 10/06/2016 at Unknown time  . metoprolol succinate (TOPROL-XL) 25 MG 24 hr tablet Take 1 tablet (25 mg total) by mouth daily. 90 tablet 11 10/06/2016 at 0900  . Multiple Vitamins-Minerals (MULTIVITAMIN WITH MINERALS) tablet Take 1 tablet by mouth daily. Reported on 01/30/2016   10/06/2016 at Unknown time  . naproxen sodium (ANAPROX) 220 MG tablet  Take 220 mg by mouth 2 (two) times daily as needed (pain). Reported on 01/30/2016   10/06/2016 at Unknown time  . rosuvastatin (CRESTOR) 40 MG tablet TAKE 1 TABLET DAILY WITH BREAKFAST FOR HYPERLIPIDEMIA (KEEP UPCOMING APPOINTMENT IN ORDER TO RECEIVE ADDITIONAL REFILLS) 90 tablet 3 10/06/2016 at Unknown time  . sodium chloride (OCEAN) 0.65 % SOLN nasal spray Place 1 spray into both nostrils daily as needed for congestion.    Past Month at Unknown time  . Testosterone 20.25 MG/1.25GM (1.62%) GEL Apply 2 Squirts topically daily as needed (occasionally). One pump per shoulder.   Past Week at Unknown time   Scheduled:  . [START ON 10/07/2016] aspirin EC  325 mg Oral Daily  . buPROPion  150 mg Oral BID  . donepezil  23 mg Oral QHS  . [START ON 10/07/2016] feeding  supplement (ENSURE ENLIVE)  237 mL Oral BID BM  . [START ON 10/07/2016] FLUoxetine  10 mg Oral Daily  . [START ON A999333 folic acid  1 mg Oral Daily  . [START ON 10/07/2016] loratadine  10 mg Oral Daily  . [START ON 10/07/2016] metoprolol succinate  25 mg Oral Daily  . [START ON 10/07/2016] multivitamin with minerals  1 tablet Oral Daily  . [START ON 10/07/2016] rosuvastatin  40 mg Oral q1800  . [START ON 10/07/2016] thiamine  100 mg Oral Daily   Or  . [START ON 10/07/2016] thiamine  100 mg Intravenous Daily    Assessment: 80yo male c/o chest discomfort/pressure since noon yesterday, on Eliquis PTA for Afib, to transition to heparin in anticipation of possible cardiac procedure; last Eliquis taken today at 0900.  Goal of Therapy:  Heparin level 0.3-0.7 units/ml aPTT 66-102 seconds Monitor platelets by anticoagulation protocol: Yes   Plan:  Will start heparin gtt at 1400 units/hr and monitor heparin levels, aPTT (while Eliquis affects anti-Xa), and CBC.  Wynona Neat, PharmD, BCPS  10/06/2016,11:28 PM

## 2016-10-07 ENCOUNTER — Other Ambulatory Visit: Payer: Self-pay | Admitting: Physician Assistant

## 2016-10-07 DIAGNOSIS — R072 Precordial pain: Secondary | ICD-10-CM

## 2016-10-07 DIAGNOSIS — I10 Essential (primary) hypertension: Secondary | ICD-10-CM

## 2016-10-07 DIAGNOSIS — I5042 Chronic combined systolic (congestive) and diastolic (congestive) heart failure: Secondary | ICD-10-CM

## 2016-10-07 DIAGNOSIS — R079 Chest pain, unspecified: Secondary | ICD-10-CM

## 2016-10-07 DIAGNOSIS — I480 Paroxysmal atrial fibrillation: Secondary | ICD-10-CM | POA: Diagnosis not present

## 2016-10-07 LAB — CBC
HEMATOCRIT: 36.7 % — AB (ref 39.0–52.0)
Hemoglobin: 12.2 g/dL — ABNORMAL LOW (ref 13.0–17.0)
MCH: 31.4 pg (ref 26.0–34.0)
MCHC: 33.2 g/dL (ref 30.0–36.0)
MCV: 94.6 fL (ref 78.0–100.0)
Platelets: 103 10*3/uL — ABNORMAL LOW (ref 150–400)
RBC: 3.88 MIL/uL — ABNORMAL LOW (ref 4.22–5.81)
RDW: 14.4 % (ref 11.5–15.5)
WBC: 4.2 10*3/uL (ref 4.0–10.5)

## 2016-10-07 LAB — TROPONIN I
Troponin I: <0.03 ng/mL (ref <0.03)
Troponin I: <0.03 ng/mL (ref <0.03)
Troponin I: <0.03 ng/mL (ref <0.03)

## 2016-10-07 LAB — HEPARIN LEVEL (UNFRACTIONATED): Heparin Unfractionated: 1.64 IU/mL — ABNORMAL HIGH (ref 0.30–0.70)

## 2016-10-07 LAB — APTT
aPTT: >200 seconds (ref 24–36)
aPTT: 154 seconds — ABNORMAL HIGH (ref 24–36)

## 2016-10-07 MED ORDER — METOPROLOL SUCCINATE ER 25 MG PO TB24
12.5000 mg | ORAL_TABLET | Freq: Every day | ORAL | Status: DC
  Administered 2016-10-08: 12.5 mg via ORAL
  Filled 2016-10-07: qty 1

## 2016-10-07 MED ORDER — HEPARIN (PORCINE) IN NACL 100-0.45 UNIT/ML-% IJ SOLN
1100.0000 [IU]/h | INTRAMUSCULAR | Status: DC
  Administered 2016-10-07: 1100 [IU]/h via INTRAVENOUS

## 2016-10-07 MED ORDER — HEPARIN (PORCINE) IN NACL 100-0.45 UNIT/ML-% IJ SOLN
900.0000 [IU]/h | INTRAMUSCULAR | Status: DC
  Administered 2016-10-07: 900 [IU]/h via INTRAVENOUS
  Filled 2016-10-07: qty 250

## 2016-10-07 MED ORDER — NITROGLYCERIN 0.4 MG SL SUBL: 0.4000 mg | SUBLINGUAL_TABLET | SUBLINGUAL | 12 refills | Status: AC | PRN

## 2016-10-07 MED ORDER — PANTOPRAZOLE SODIUM 40 MG PO TBEC
40.0000 mg | DELAYED_RELEASE_TABLET | Freq: Every day | ORAL | Status: DC
  Administered 2016-10-07 – 2016-10-08 (×2): 40 mg via ORAL
  Filled 2016-10-07 (×2): qty 1

## 2016-10-07 NOTE — Care Management Obs Status (Signed)
[place] NOTIFICATION   Patient Details  Name: Aaron Berg MRN: [id number] Date of Birth: 29-Jun-1936   Medicare Observation Status Notification Given:  Yes    Dawayne Patricia, RN 10/07/2016, 10:47 AM

## 2016-10-07 NOTE — Progress Notes (Signed)
[facility] PROGRESS NOTE  Aaron Berg [id number] DOB: [birth year] DOA: 10/06/2016   PCP: Wyatt Haste, MD     Assessment/Plan: Principal Problem:   Precordial chest pain Active Problems:   Sleep apnea   Chronic combined systolic and diastolic CHF (congestive heart failure) (HCC)   Essential hypertension, benign   PAF (paroxysmal atrial fibrillation) (HCC)   Chest pain   80 y.o. male with a history of CAD s/p CABG 123456, combined systolic and diastolic CHF, aortic stenosis, HTN, HL, OSA on CPAP, COPD, microvascular dementia,  PAF on Eliquis and CVA who presented with CP   1. Chest pain concerning for angina - patient is presently chest pain-free. Negative cardiac markers, cardiology recommends to check 2-D echo. Patient is on aspirin and statins and beta blocker. Admitting physician held Apixaban and placed  patient on heparin. Cardiology consulted. They recommend 2-D echo.ischemic evaluation with a Lexiscan myoview study. Cardiology feels he is relatively low risk and this can be arranged as an outpatient. 2-D echo still pending 2.  Paroxysmal atrial fibrillation - chads 2 vasc score is more than 2. Heparin at this time in anticipation of cardiac procedure and hold off apixaban. Cardiology recommended to reduce dose of metoprolol given bradycardia.CHADSVASCs score of 7 3. Hypertension on metoprolol. 4. Sleep apnea on CPAP. 5. Anemia - follow CBC and further workup as outpatient. 6. Thrombocytopenia probably secondary to alcoholism - follow CBC. Patient is on CIWA. 7. Chronic combined systolic and diastolic CHF last EF measured in 2015 was 45-50% - appears euvolemic. 8. History of stroke on anticoagulation and statins.01/09/2016: Cholesterol 191; HDL 56; LDL Cholesterol 85; Triglycerides 251; VLDL 50   DVT prophylaxsis   Code Status:  Full code     Family Communication: Discussed in detail with the patient, all imaging results, lab results explained to the  patient   Disposition Plan:  As above       Consultants:  cardiology  Procedures:  None   Antibiotics: Anti-infectives    None         HPI/Subjective: Currently chest pain-free  Objective: Vitals:   10/06/16 2028 10/07/16 0419 10/07/16 0951 10/07/16 1306  BP: (!) 146/79 127/76 138/82 108/66  Pulse: 94 (!) 51 (!) 52 (!) 50  Resp: 18 20  18   Temp: 98.6 F (37 C) 97.7 F (36.5 C)  98.2 F (36.8 C)  TempSrc: Oral Oral  Oral  SpO2: 97% 96%  99%  Weight: 95.4 kg (210 lb 4.8 oz)     Height: 5\' 10"  (1.778 m)       Intake/Output Summary (Last 24 hours) at 10/07/16 1450 Last data filed at 10/07/16 1300  Gross per 24 hour  Intake              740 ml  Output             1000 ml  Net             -260 ml    Exam:  Examination:  General exam: Appears calm and comfortable  Respiratory system: Clear to auscultation. Respiratory effort normal. Cardiovascular system: S1 & S2 heard, RRR. No JVD, murmurs, rubs, gallops or clicks. No pedal edema. Gastrointestinal system: Abdomen is nondistended, soft and nontender. No organomegaly or masses felt. Normal bowel sounds heard. Central nervous system: Alert and oriented. No focal neurological deficits. Extremities: Symmetric 5 x 5 power. Skin: No rashes, lesions or ulcers Psychiatry: Judgement and insight appear normal. Mood &  affect appropriate.     Data Reviewed: I have personally reviewed following labs and imaging studies  Micro Results No results found for this or any previous visit (from the past 240 hour(s)).  Radiology Reports Dg Chest 2 View  Result Date: 10/06/2016 CLINICAL DATA:  Chest discomfort today with right-sided chest spasm. No breathing complaints. EXAM: CHEST  2 VIEW COMPARISON:  06/30/2014 FINDINGS: Old bilateral rib fractures. No acute osseous abnormality. Right shoulder arthroplasty partially visualized. The heart is normal in size. There is atherosclerosis of the aortic arch without aneurysm.  Chronic mild diffuse interstitial prominence without pneumonic consolidation, effusion or pneumothorax. IMPRESSION: Chronic stable changes of the chest without acute pulmonary disease. Aortic atherosclerosis. Electronically Signed   By: Ashley Royalty M.D.   On: 10/06/2016 18:28     CBC  Recent Labs Lab 10/06/16 1715 10/07/16 0850  WBC 5.1 4.2  HGB 12.9* 12.2*  HCT 38.9* 36.7*  PLT 125* 103*  MCV 94.2 94.6  MCH 31.2 31.4  MCHC 33.2 33.2  RDW 14.0 14.4  LYMPHSABS 1.2  --   MONOABS 0.5  --   EOSABS 0.1  --   BASOSABS 0.0  --     Chemistries   Recent Labs Lab 10/06/16 1715  NA 140  K 3.9  CL 108  CO2 25  GLUCOSE 85  BUN 23*  CREATININE 1.09  CALCIUM 9.8  AST 27  ALT 23  ALKPHOS 43  BILITOT 0.7   ------------------------------------------------------------------------------------------------------------------ estimated creatinine clearance is 64.8 mL/min (by C-G formula based on SCr of 1.09 mg/dL). ------------------------------------------------------------------------------------------------------------------ No results for input(s): HGBA1C in the last 72 hours. ------------------------------------------------------------------------------------------------------------------ No results for input(s): CHOL, HDL, LDLCALC, TRIG, CHOLHDL, LDLDIRECT in the last 72 hours. ------------------------------------------------------------------------------------------------------------------ No results for input(s): TSH, T4TOTAL, T3FREE, THYROIDAB in the last 72 hours.  Invalid input(s): FREET3 ------------------------------------------------------------------------------------------------------------------ No results for input(s): VITAMINB12, FOLATE, FERRITIN, TIBC, IRON, RETICCTPCT in the last 72 hours.  Coagulation profile No results for input(s): INR, PROTIME in the last 168 hours.  No results for input(s): DDIMER in the last 72 hours.  Cardiac Enzymes  Recent Labs Lab  10/06/16 2323 10/07/16 0426 10/07/16 1157  TROPONINI <0.03 <0.03 <0.03   ------------------------------------------------------------------------------------------------------------------ Invalid input(s): POCBNP   CBG: No results for input(s): GLUCAP in the last 168 hours.     Studies: Dg Chest 2 View  Result Date: 10/06/2016 CLINICAL DATA:  Chest discomfort today with right-sided chest spasm. No breathing complaints. EXAM: CHEST  2 VIEW COMPARISON:  06/30/2014 FINDINGS: Old bilateral rib fractures. No acute osseous abnormality. Right shoulder arthroplasty partially visualized. The heart is normal in size. There is atherosclerosis of the aortic arch without aneurysm. Chronic mild diffuse interstitial prominence without pneumonic consolidation, effusion or pneumothorax. IMPRESSION: Chronic stable changes of the chest without acute pulmonary disease. Aortic atherosclerosis. Electronically Signed   By: Ashley Royalty M.D.   On: 10/06/2016 18:28      Lab Results  Component Value Date   HGBA1C 6.1 (H) 08/15/2014   Lab Results  Component Value Date   LDLCALC 85 01/09/2016   CREATININE 1.09 10/06/2016       Scheduled Meds: . aspirin EC  325 mg Oral Daily  . buPROPion  150 mg Oral BID  . donepezil  23 mg Oral QHS  . feeding supplement (ENSURE ENLIVE)  237 mL Oral BID BM  . FLUoxetine  10 mg Oral Daily  . folic acid  1 mg Oral Daily  . loratadine  10 mg Oral Daily  . [START  ON 10/08/2016] metoprolol succinate  12.5 mg Oral Daily  . multivitamin with minerals  1 tablet Oral Daily  . pantoprazole  40 mg Oral Daily  . rosuvastatin  40 mg Oral q1800  . thiamine  100 mg Oral Daily   Or  . thiamine  100 mg Intravenous Daily   Continuous Infusions: . heparin 1,100 Units/hr (10/07/16 1238)     LOS: 0 days    Time spent: >30 MINS    [facility]  [facility] Pager [phone]. If 7PM-7AM, please contact night-coverage at [email or web address], password [facility] 10/07/2016, 2:50  PM  LOS: 0 days

## 2016-10-07 NOTE — Progress Notes (Signed)
[place] for heparin Indication: atrial fibrillation  Allergies  Allergen Reactions  . Procardia [Nifedipine] Rash    All over body   Patient Measurements: Height: 5\' 10"  (177.8 cm) Weight: 210 lb 4.8 oz (95.4 kg) IBW/kg (Calculated) : 73 Heparin Dosing Weight: 95kg  Vital Signs: Temp: 98.2 F (36.8 C) (10/24 1306) Temp Source: Oral (10/24 1306) BP: 108/66 (10/24 1306) Pulse Rate: 50 (10/24 1306)  Labs:  Recent Labs  10/06/16 1715 10/06/16 2323 10/07/16 0426 10/07/16 0721 10/07/16 0850 10/07/16 1157 10/07/16 1829  HGB 12.9*  --   --   --  12.2*  --   --   HCT 38.9*  --   --   --  36.7*  --   --   PLT 125*  --   --   --  103*  --   --   APTT  --   --   --   --  >200*  --  154*  HEPARINUNFRC  --   --   --  1.64*  --   --   --   CREATININE 1.09  --   --   --   --   --   --   TROPONINI  --  <0.03 <0.03  --   --  <0.03  --    Estimated Creatinine Clearance: 64.8 mL/min (by C-G formula based on SCr of 1.09 mg/dL).  Medical History: Past Medical History:  Diagnosis Date  . Arthritis    "thumbs, wrists, legs" (10/06/2016)  . Atrial flutter ([place])   . CAD (coronary artery disease) of bypass graft   . Cerebrovascular disease 2006   multiple ischemic changes on prior MRI  . CHF (congestive heart failure) ([place])   . Chronic lower back pain   . Chronic systolic dysfunction of left ventricle   . Complication of anesthesia    "woke up too soon"  . COPD (chronic obstructive pulmonary disease) ([place])    "no signs or tests"  . Depression   . Dry eyes   . Dyslipidemia    well controlled  . Gait disorder   . Heart murmur    "found in ~ 1946"  . Hypertension   . Hypertensive cardiovascular disease   . Male hypogonadism   . Memory loss, short term 2/15   per daughter since last surgery and [facility] at [facility].  "Hallucinations have stopped"  . Migraine    silent migraines- no pain just go blind for just a minute  . Migraine    "major one when I was a kid; very painful"  . Multiple falls    "after both knees surgeries; went to rehab; came out and fell all the time" (10/06/2016)  . Myocardial infarction 2004   "was told I'd had one that I didn't know I'd had; ended up getting OHS"  . Obesity   . OSA on CPAP    cpap  . Persistent atrial fibrillation ([place]) 2003  . Skin cancer of anterior chest   . Spinal stenosis    Medications:  Prescriptions Prior to Admission  Medication Sig Dispense Refill Last Dose  . apixaban (ELIQUIS) 5 MG TABS tablet Take 1 tablet (5 mg total) by mouth 2 (two) times daily. 180 tablet 3 10/06/2016 at 0900  . buPROPion (WELLBUTRIN SR) 150 MG 12 hr tablet TAKE 1 TABLET TWICE A DAY 180 tablet 0 10/06/2016 at Unknown time  . cholecalciferol (VITAMIN D) 1000 UNITS tablet Take 1,000 Units by  mouth daily.    10/06/2016 at Unknown time  . donepezil (ARICEPT) 23 MG TABS tablet Take 1 tablet (23 mg total) by mouth at bedtime. 90 tablet 1 10/05/2016 at Unknown time  . fexofenadine (ALLEGRA) 180 MG tablet Take 180 mg by mouth daily.   10/06/2016 at Unknown time  . FLUoxetine (PROZAC) 10 MG capsule TAKE 1 CAPSULE DAILY 90 capsule 0 10/06/2016 at Unknown time  . metoprolol succinate (TOPROL-XL) 25 MG 24 hr tablet Take 1 tablet (25 mg total) by mouth daily. 90 tablet 11 10/06/2016 at 0900  . Multiple Vitamins-Minerals (MULTIVITAMIN WITH MINERALS) tablet Take 1 tablet by mouth daily. Reported on 01/30/2016   10/06/2016 at Unknown time  . naproxen sodium (ANAPROX) 220 MG tablet Take 220 mg by mouth 2 (two) times daily as needed (pain). Reported on 01/30/2016   10/06/2016 at Unknown time  . rosuvastatin (CRESTOR) 40 MG tablet TAKE 1 TABLET DAILY WITH BREAKFAST FOR HYPERLIPIDEMIA (KEEP UPCOMING APPOINTMENT IN ORDER TO RECEIVE ADDITIONAL REFILLS) 90 tablet 3 10/06/2016 at Unknown time  . sodium chloride (OCEAN) 0.65 % SOLN nasal spray Place 1 spray into both nostrils daily as needed for congestion.    Past Month at  Unknown time  . Testosterone 20.25 MG/1.25GM (1.62%) GEL Apply 2 Squirts topically daily as needed (occasionally). One pump per shoulder.   Past Week at Unknown time   Scheduled:  . aspirin EC  325 mg Oral Daily  . buPROPion  150 mg Oral BID  . donepezil  23 mg Oral QHS  . feeding supplement (ENSURE ENLIVE)  237 mL Oral BID BM  . FLUoxetine  10 mg Oral Daily  . folic acid  1 mg Oral Daily  . loratadine  10 mg Oral Daily  . [START ON 10/08/2016] metoprolol succinate  12.5 mg Oral Daily  . multivitamin with minerals  1 tablet Oral Daily  . pantoprazole  40 mg Oral Daily  . rosuvastatin  40 mg Oral q1800  . thiamine  100 mg Oral Daily   Or  . thiamine  100 mg Intravenous Daily   Assessment: 80yo male c/o chest discomfort/pressure since noon yesterday, on Eliquis PTA for Afib, to transition to heparin in anticipation of possible cardiac procedure; last Eliquis taken 10/23 at 0900 which will potentiate the heparin level.   - aPTT supratherapeutic (>200) on heparin at 1400 units/h.  - aPTT supratherapeutic (154) on heparin at 1100 units/h.   Nurse reports no bleeding, abnormal bruising or IV infusion issues  Goal of Therapy:  Heparin level 0.3-0.7 units/ml aPTT 66-102 seconds Monitor platelets by anticoagulation protocol: Yes   Plan:  Hold heparin x 1 hour and resume at lower rate 900 units/h 8h aPTT Daily aPTT/heparin level/CBC Monitor for s/sx bleeding Eliquis on hold - F/u Cards plans  Georga Bora, PharmD Clinical Pharmacist Pager: [phone] 10/07/2016 7:50 PM

## 2016-10-07 NOTE — Procedures (Signed)
Patient did not want to wear CPAP tonight.  Patient said he would wear tomorrow night.

## 2016-10-07 NOTE — Progress Notes (Signed)
[place] for heparin Indication: atrial fibrillation  Allergies  Allergen Reactions  . Procardia [Nifedipine] Rash    All over body    Patient Measurements: Height: 5\' 10"  (177.8 cm) Weight: 210 lb 4.8 oz (95.4 kg) IBW/kg (Calculated) : 73 Heparin Dosing Weight: 95kg  Vital Signs: Temp: 97.7 F (36.5 C) (10/24 0419) Temp Source: Oral (10/24 0419) BP: 127/76 (10/24 0419) Pulse Rate: 51 (10/24 0419)  Labs:  Recent Labs  10/06/16 1715 10/06/16 2323 10/07/16 0426  HGB 12.9*  --   --   HCT 38.9*  --   --   PLT 125*  --   --   CREATININE 1.09  --   --   TROPONINI  --  <0.03 <0.03    Estimated Creatinine Clearance: 64.8 mL/min (by C-G formula based on SCr of 1.09 mg/dL).   Medical History: Past Medical History:  Diagnosis Date  . Arthritis    "thumbs, wrists, legs" (10/06/2016)  . Atrial flutter ([place])   . CAD (coronary artery disease) of bypass graft   . Cerebrovascular disease 2006   multiple ischemic changes on prior MRI  . CHF (congestive heart failure) ([place])   . Chronic lower back pain   . Chronic systolic dysfunction of left ventricle   . Complication of anesthesia    "woke up too soon"  . COPD (chronic obstructive pulmonary disease) ([place])    "no signs or tests"  . Depression   . Dry eyes   . Dyslipidemia    well controlled  . Gait disorder   . Heart murmur    "found in ~ 1946"  . Hypertension   . Hypertensive cardiovascular disease   . Male hypogonadism   . Memory loss, short term 2/15   per daughter since last surgery and placement at [facility].  "Hallucinations have stopped"  . Migraine    silent migraines- no pain just go blind for just a minute  . Migraine    "major one when I was a kid; very painful"  . Multiple falls    "after both knees surgeries; went to rehab; came out and fell all the time" (10/06/2016)  . Myocardial infarction 2004   "was told I'd had one that I didn't know I'd had; ended up  getting OHS"  . Obesity   . OSA on CPAP    cpap  . Persistent atrial fibrillation ([place]) 2003  . Skin cancer of anterior chest   . Spinal stenosis     Medications:  Prescriptions Prior to Admission  Medication Sig Dispense Refill Last Dose  . apixaban (ELIQUIS) 5 MG TABS tablet Take 1 tablet (5 mg total) by mouth 2 (two) times daily. 180 tablet 3 10/06/2016 at 0900  . buPROPion (WELLBUTRIN SR) 150 MG 12 hr tablet TAKE 1 TABLET TWICE A DAY 180 tablet 0 10/06/2016 at Unknown time  . cholecalciferol (VITAMIN D) 1000 UNITS tablet Take 1,000 Units by mouth daily.    10/06/2016 at Unknown time  . donepezil (ARICEPT) 23 MG TABS tablet Take 1 tablet (23 mg total) by mouth at bedtime. 90 tablet 1 10/05/2016 at Unknown time  . fexofenadine (ALLEGRA) 180 MG tablet Take 180 mg by mouth daily.   10/06/2016 at Unknown time  . FLUoxetine (PROZAC) 10 MG capsule TAKE 1 CAPSULE DAILY 90 capsule 0 10/06/2016 at Unknown time  . metoprolol succinate (TOPROL-XL) 25 MG 24 hr tablet Take 1 tablet (25 mg total) by mouth daily. 90 tablet 11 10/06/2016 at  0900  . Multiple Vitamins-Minerals (MULTIVITAMIN WITH MINERALS) tablet Take 1 tablet by mouth daily. Reported on 01/30/2016   10/06/2016 at Unknown time  . naproxen sodium (ANAPROX) 220 MG tablet Take 220 mg by mouth 2 (two) times daily as needed (pain). Reported on 01/30/2016   10/06/2016 at Unknown time  . rosuvastatin (CRESTOR) 40 MG tablet TAKE 1 TABLET DAILY WITH BREAKFAST FOR HYPERLIPIDEMIA (KEEP UPCOMING APPOINTMENT IN ORDER TO RECEIVE ADDITIONAL REFILLS) 90 tablet 3 10/06/2016 at Unknown time  . sodium chloride (OCEAN) 0.65 % SOLN nasal spray Place 1 spray into both nostrils daily as needed for congestion.    Past Month at Unknown time  . Testosterone 20.25 MG/1.25GM (1.62%) GEL Apply 2 Squirts topically daily as needed (occasionally). One pump per shoulder.   Past Week at Unknown time   Scheduled:  . aspirin EC  325 mg Oral Daily  . buPROPion  150 mg Oral BID   . donepezil  23 mg Oral QHS  . feeding supplement (ENSURE ENLIVE)  237 mL Oral BID BM  . FLUoxetine  10 mg Oral Daily  . folic acid  1 mg Oral Daily  . loratadine  10 mg Oral Daily  . metoprolol succinate  25 mg Oral Daily  . multivitamin with minerals  1 tablet Oral Daily  . rosuvastatin  40 mg Oral q1800  . thiamine  100 mg Oral Daily   Or  . thiamine  100 mg Intravenous Daily    Assessment: 80yo male c/o chest discomfort/pressure since noon yesterday, on Eliquis PTA for Afib, to transition to heparin in anticipation of possible cardiac procedure; last Eliquis taken 10/23 at 0900. Hg low stable, plt 125, no bleed documented.  Initial aPTT supratherapeutic (>200) on heparin at 1400 units/h. Heparin level also elevated (1.64). Following aPTTs while Eliquis affecting heparin levels.  Goal of Therapy:  Heparin level 0.3-0.7 units/ml aPTT 66-102 seconds Monitor platelets by anticoagulation protocol: Yes   Plan:  Hold heparin x 1 hour and resume at lower rate 1100 units/h 8h aPTT Daily aPTT/heparin level/CBC Monitor for s/sx bleeding Eliquis on hold - F/u Cards plans   Elicia Lamp, PharmD, BCPS Clinical Pharmacist 10/07/2016 8:25 AM

## 2016-10-07 NOTE — Progress Notes (Signed)
Nutrition Brief Note  Patient identified on the Malnutrition Screening Tool (MST) Report.  Pt has had a 11% weight loss since January 2017; not significant for time frame.  Wt Readings from Last 15 Encounters:  10/06/16 210 lb 4.8 oz (95.4 kg)  09/17/16 203 lb (92.1 kg)  09/11/16 210 lb (95.3 kg)  05/29/16 200 lb (90.7 kg)  05/16/16 201 lb 3.2 oz (91.3 kg)  05/06/16 208 lb (94.3 kg)  03/31/16 208 lb (94.3 kg)  03/17/16 213 lb 9.6 oz (96.9 kg)  02/12/16 220 lb 6 oz (100 kg)  01/31/16 225 lb (102.1 kg)  01/09/16 236 lb (107 kg)  04/11/15 238 lb (108 kg)  03/29/15 233 lb (105.7 kg)  12/18/14 232 lb 3.2 oz (105.3 kg)  10/03/14 236 lb (107 kg)    Body mass index is 30.17 kg/m. Patient meets criteria for Obesity Class I based on current BMI.   Current diet order is Heart Healthy. Labs and medications reviewed.   No nutrition interventions warranted at this time. If nutrition issues arise, please consult RD.   Arthur Holms, RD, LDN Pager #: [phone] After-Hours Pager #: [phone]

## 2016-10-07 NOTE — Consult Note (Signed)
CARDIOLOGY CONSULT NOTE   Patient ID: Aaron Berg MRN: [id number] DOB/AGE: Sep 25, 1936 80 y.o.  Admit date: 10/06/2016  Primary Physician   Wyatt Haste, MD Primary Cardiologist   Dr. Tamala Julian Reason for Consultation   Chest pain Requesting Physician  Dr. Allyson Sabal  HPI: Aaron Berg is a 80 y.o. male with a history of CAD s/p CABG 123456, combined systolic and diastolic CHF, aortic stenosis, HTN, HL, OSA on CPAP, COPD, microvascular dementia,  PAF on Eliquis and CVA who presented with CP.  He was doing well on cardiac stand point when last seen by Dr. Tamala Julian 05/15/2016.  Last echo 04/2014 showed LV EF of 45- 50%, mild concentric hypertrophy, mild AS with mean gradient: 53mm Hg (S). Peakgradient: 70mm Hg (S), aortic root dilation to 31mm, ascending aortic dilation to 66mm, mild dilated RV,, mild dilated RA, elevated central venous pressure.   Having memory and balance issue with hx of multiple falls.  This is limiting his ambulation. He uses walker at home.   The patient presented to ER 10/06/16 with 1-2 days hx of chest pain. He describes the pain as "dully achy" sensation over sternum and its intermittent lasting for minutes to hours. Denies associated dyspnea, diaphoresis, nausea or vomiting. No radiation of pain. He does feels abdominal fullness and burping sensation. Denies LE edema, orthopnea, PND, syncope, palpitations. This pain in different from prior cardiac pain.   EKG showed sinus bradycardia at rate of 56 bmp with PVCs. No acute ischemic changes. Troponin negative x 2. Electrolytes and Scr normal.    Past Medical History:  Diagnosis Date  . Arthritis    "thumbs, wrists, legs" (10/06/2016)  . Atrial flutter ([place])   . CAD (coronary artery disease) of bypass graft   . Cerebrovascular disease 2006   multiple ischemic changes on prior MRI  . CHF (congestive heart failure) ([place])   . Chronic lower back pain   . Chronic systolic dysfunction of left ventricle   .  Complication of anesthesia    "woke up too soon"  . COPD (chronic obstructive pulmonary disease) ([place])    "no signs or tests"  . Depression   . Dry eyes   . Dyslipidemia    well controlled  . Gait disorder   . Heart murmur    "found in ~ 1946"  . Hypertension   . Hypertensive cardiovascular disease   . Male hypogonadism   . Memory loss, short term 2/15   per daughter since last surgery and placement at [facility].  "Hallucinations have stopped"  . Migraine    silent migraines- no pain just go blind for just a minute  . Migraine    "major one when I was a kid; very painful"  . Multiple falls    "after both knees surgeries; went to rehab; came out and fell all the time" (10/06/2016)  . Myocardial infarction 2004   "was told I'd had one that I didn't know I'd had; ended up getting OHS"  . Obesity   . OSA on CPAP    cpap  . Persistent atrial fibrillation ([place]) 2003  . Skin cancer of anterior chest   . Spinal stenosis      Past Surgical History:  Procedure Laterality Date  . APPENDECTOMY  as child  . CARDIAC CATHETERIZATION  11/2003   Archie Endo 04/29/2011  . CARDIOVERSION N/A 04/26/2014   Procedure: CARDIOVERSION;  Surgeon: Sinclair Grooms, MD;  Location: [facility];  Service: Cardiovascular;  Laterality: N/A;  .  CARDIOVERSION N/A 05/24/2014   Procedure: CARDIOVERSION;  Surgeon: Sinclair Grooms, MD;  Location: [facility] ENDOSCOPY;  Service: Cardiovascular;  Laterality: N/A;  . CATARACT EXTRACTION W/ INTRAOCULAR LENS  IMPLANT, BILATERAL Bilateral 2000s  . CORONARY ANGIOPLASTY  1991   "no stents" (10/06/2016)  . CORONARY ARTERY BYPASS GRAFT  11/2002   X 3/notes 04/29/2011  . CYST EXCISION     lumbar; Dr. Joya Salm  . EYE SURGERY Right    detached retina  . JOINT REPLACEMENT    . MOHS SURGERY     on chest  . RETINAL DETACHMENT SURGERY Right   . SHOULDER SURGERY Right    Right with murphy, reattach ligaments  . TONSILLECTOMY  as child  . TOTAL KNEE ARTHROPLASTY Right 01/02/2014    Procedure: RIGHT TOTAL KNEE ARTHROPLASTY;  Surgeon: Mauri Pole, MD;  Location: [facility];  Service: Orthopedics;  Laterality: Right;  . TOTAL KNEE ARTHROPLASTY Left 01/31/2014   Procedure: LEFT TOTAL KNEE ARTHROPLASTY;  Surgeon: Mauri Pole, MD;  Location: [facility];  Service: Orthopedics;  Laterality: Left;  . TOTAL SHOULDER ARTHROPLASTY Right   . VIDEO ASSISTED THORACOSCOPY (VATS)/THOROCOTOMY  2003   benign nodule    Allergies  Allergen Reactions  . Procardia [Nifedipine] Rash    All over body    I have reviewed the patient's current medications . aspirin EC  325 mg Oral Daily  . buPROPion  150 mg Oral BID  . donepezil  23 mg Oral QHS  . feeding supplement (ENSURE ENLIVE)  237 mL Oral BID BM  . FLUoxetine  10 mg Oral Daily  . folic acid  1 mg Oral Daily  . loratadine  10 mg Oral Daily  . metoprolol succinate  25 mg Oral Daily  . multivitamin with minerals  1 tablet Oral Daily  . rosuvastatin  40 mg Oral q1800  . thiamine  100 mg Oral Daily   Or  . thiamine  100 mg Intravenous Daily   . heparin     acetaminophen, LORazepam **OR** LORazepam, ondansetron (ZOFRAN) IV  Prior to Admission medications   Medication Sig Start Date End Date Taking? Authorizing Provider  apixaban (ELIQUIS) 5 MG TABS tablet Take 1 tablet (5 mg total) by mouth 2 (two) times daily. 06/16/16  Yes Belva Crome, MD  buPROPion [facility] SR) 150 MG 12 hr tablet TAKE 1 TABLET TWICE A DAY 08/26/16  Yes [name], MD  cholecalciferol (VITAMIN D) 1000 UNITS tablet Take 1,000 Units by mouth daily.    Yes Historical Provider, MD  donepezil (ARICEPT) 23 MG TABS tablet Take 1 tablet (23 mg total) by mouth at bedtime. 06/11/16  Yes Kathrynn Ducking, MD  fexofenadine (ALLEGRA) 180 MG tablet Take 180 mg by mouth daily.   Yes Historical Provider, MD  FLUoxetine (PROZAC) 10 MG capsule TAKE 1 CAPSULE DAILY 08/26/16  Yes [name], MD  metoprolol succinate (TOPROL-XL) 25 MG 24 hr tablet Take 1 tablet (25 mg total)  by mouth daily. 05/27/16  Yes Belva Crome, MD  Multiple Vitamins-Minerals (MULTIVITAMIN WITH MINERALS) tablet Take 1 tablet by mouth daily. Reported on 01/30/2016   Yes Historical Provider, MD  naproxen sodium (ANAPROX) 220 MG tablet Take 220 mg by mouth 2 (two) times daily as needed (pain). Reported on 01/30/2016   Yes Historical Provider, MD  rosuvastatin (CRESTOR) 40 MG tablet TAKE 1 TABLET DAILY WITH BREAKFAST FOR HYPERLIPIDEMIA (KEEP UPCOMING APPOINTMENT IN ORDER TO RECEIVE ADDITIONAL REFILLS) 06/19/16  Yes Lynnell Dike  Tamala Julian, MD  sodium chloride (OCEAN) 0.65 % SOLN nasal spray Place 1 spray into both nostrils daily as needed for congestion.    Yes Historical Provider, MD  Testosterone 20.25 MG/1.25GM (1.62%) GEL Apply 2 Squirts topically daily as needed (occasionally). One pump per shoulder. 03/06/14  Yes Gayland Curry, DO     Social History   Social History  . Marital status: Divorced    Spouse name: N/A  . Number of children: 2  . Years of education: Masters   Occupational History  .      retired   Social History Main Topics  . Smoking status: Former Smoker    Packs/day: 2.00    Years: 33.00    Types: Cigarettes    Quit date: 06/17/1989  . Smokeless tobacco: Never Used  . Alcohol use 8.4 oz/week    14 Shots of liquor per week     Comment: 10/06/2016 "bourbon"  . Drug use: No  . Sexual activity: Not Currently   Other Topics Concern  . Not on file   Social History Narrative   Patient is single and lives alone.   Patient is retired.   Patient has a Scientist, water quality.   Patient has two adult children.   Patient is right-handed.   Patient drinks one cup of coffee daily.      Pt lives in [place] in his RV alone.   Retired Engineer, civil (consulting)    Family Status  Relation Status  . Father Deceased  . Mother Deceased  . Sister Alive  . Daughter Alive  . Sister Alive  . Daughter Alive   Family History  Problem Relation Age of Onset  . Pancreatic cancer Father   . CAD  Father   . CAD Mother   . Arthritis Sister   . Healthy Sister     ROS:  Full 14 point review of systems complete and found to be negative unless listed above.  Physical Exam: Blood pressure 138/82, pulse (!) 52, temperature 97.7 F (36.5 C), temperature source Oral, resp. rate 20, height 5\' 10"  (1.778 m), weight 210 lb 4.8 oz (95.4 kg), SpO2 96 %.  General: Well developed, well nourished, male in no acute distress Head: Eyes PERRLA, No xanthomas. Normocephalic and atraumatic, oropharynx without edema or exudate.  Lungs: Resp regular and unlabored, CTA. Heart: RRR no s3, s4, or systolic murmuc   Neck: No carotid bruits. No lymphadenopathy. No  JVD. Abdomen: Bowel sounds present, abdomen soft and distended Msk:  No spine or cva tenderness. No weakness, no joint deformities or effusions. Extremities: No clubbing, cyanosis or edema. DP/PT/Radials 2+ and equal bilaterally. Neuro: Alert and oriented X 3. No focal deficits noted. Psych:  Good affect, responds appropriately Skin: No rashes or lesions noted.  Labs:   Lab Results  Component Value Date   WBC 4.2 10/07/2016   HGB 12.2 (L) 10/07/2016   HCT 36.7 (L) 10/07/2016   MCV 94.6 10/07/2016   PLT PENDING 10/07/2016   No results for input(s): INR in the last 72 hours.  Recent Labs Lab 10/06/16 1715  NA 140  K 3.9  CL 108  CO2 25  BUN 23*  CREATININE 1.09  CALCIUM 9.8  PROT 5.7*  BILITOT 0.7  ALKPHOS 43  ALT 23  AST 27  GLUCOSE 85  ALBUMIN 3.4*   No results found for: MG  Recent Labs  10/06/16 2323 10/07/16 0426  TROPONINI <0.03 <0.03    Recent Labs  10/06/16 1729  TROPIPOC  0.01   Pro B Natriuretic peptide (BNP)  Date/Time Value Ref Range Status  03/09/2014 03:14 PM 255.0 (H) 0.0 - 100.0 pg/mL Final   Lab Results  Component Value Date   CHOL 191 01/09/2016   HDL 56 01/09/2016   LDLCALC 85 01/09/2016   TRIG 251 (H) 01/09/2016   No results found for: DDIMER No results found for: LIPASE, AMYLASE TSH   Date/Time Value Ref Range Status  01/09/2016 12:01 AM 1.501 0.350 - 4.500 uIU/mL Final   No results found for: VITAMINB12, FOLATE, FERRITIN, TIBC, IRON, RETICCTPCT  Echo: 04/20/14 LV EF: 45% -  50%  ------------------------------------------------------------ Indications:   Aortic stenosis 424.1. Atrial fibrillation - 427.31.  ------------------------------------------------------------ History:  PMH: Acquired from the patient and from the patient's chart. Atrial fibrillation. Congestive heart failure. Chronic obstructive pulmonary disease. Risk factors: Hypertension. Obese. Dyslipidemia.  ------------------------------------------------------------ Study Conclusions  - Left ventricle: The cavity size was mildly dilated. There was mild concentric hypertrophy. Systolic function was mildly reduced. The estimated ejection fraction was in the range of 45% to 50%. Contrast was administered. No apical thrombus was identified. The study is not technically sufficient to allow evaluation of LV diastolic function. - Aortic valve: Mildly calcified leaflets. Transvalvular velocity was minimally increased - there is likely mild aortic stenosis. Mean gradient: 58mm Hg (S). Peak gradient: 22mm Hg (S). - Aorta: The aortic root and ascending aorta are dilated. Aortic root dimension: 84mm (ED). Ascending aortic diameter: 19mm (S). - Right ventricle: The cavity size was mildly dilated. - Right atrium: The atrium was mildly dilated. - Atrial septum: There was a patent foramen ovale. - Inferior vena cava: The vessel was dilated; the respirophasic diameter changes were blunted (< 50%); findings are consistent with elevated central venous pressure. - Pericardium, extracardiac: There was no pericardial effusion.    Radiology:  Dg Chest 2 View  Result Date: 10/06/2016 CLINICAL DATA:  Chest discomfort today with right-sided chest spasm. No breathing  complaints. EXAM: CHEST  2 VIEW COMPARISON:  06/30/2014 FINDINGS: Old bilateral rib fractures. No acute osseous abnormality. Right shoulder arthroplasty partially visualized. The heart is normal in size. There is atherosclerosis of the aortic arch without aneurysm. Chronic mild diffuse interstitial prominence without pneumonic consolidation, effusion or pneumothorax. IMPRESSION: Chronic stable changes of the chest without acute pulmonary disease. Aortic atherosclerosis. Electronically Signed   By: Ashley Royalty M.D.   On: 10/06/2016 18:28    ASSESSMENT AND PLAN:     1. Chest pain - Atypical. Troponin negative. EKG non ischemic. Seems more GI related vs MSK as reproducible with palpation. Trial of PPI. Last myoview in 2012 was normal. Pending echo. No inpatient work up unless severe abnormal echo. Stress test as outpatient.   2. Chronic combined systolic and diastolic CHF (congestive heart failure)  - Euvolemic.  3. Essential hypertension, benign - Stable and well controlled on current regimen  4.  PAF (paroxysmal atrial fibrillation) (HCC) - Maintaining sinus rhythm with PACs and PVCs at heart rate of 40-50s. Consider reducing toprol -xl dose.  - CHADSVASCs score of 7. Continue Eliquis for anticoagulation. Hx of multiple falls due to balance issue.   5. CAD s/p CABG - As above. Discontinue ASA due to need to anticoagulation. Continue statin. He will Rx of SL nitro at discharge.   6. HLD with LDL goal less than 70 - 01/09/2016: Cholesterol 191; HDL 56; LDL Cholesterol 85; Triglycerides 251; VLDL 50  - Continue statin.    SignedLeanor Kail, [place] 10/07/2016, 10:43 AM Pager [phone]  Co-Sign MD  Patient seen and examined and history reviewed. Agree with above findings and plan. Pleasant 80 yo WM with history of CAD s/p CABG presents with symptoms of chest discomfort. No real pain. Notes his stomach "rumbling" a lot and has a lot of gas and belching. No relation to activity or meals.  He is fairly sedentary. Last Myoview in 2012.  On exam lungs are clear. He has a gr 2/6 systolic murmur at the apex. Abdomen large with normal BS. Nontender.   Ecg shows sinus brady with PVCs. No acute change.  Troponin levels are all normal.   Based on his history I think his symptoms are most likely GI related. We discussed ischemic evaluation with a Lexiscan myoview study. I think he is relatively low risk and we can arrange this as an outpatient. Echo pending. Would consider reducing Toprol XL to 12.5 mg daily given bradycardia.   Aaron Berg, [place] 10/07/2016 11:21 AM

## 2016-10-08 ENCOUNTER — Observation Stay (HOSPITAL_BASED_OUTPATIENT_CLINIC_OR_DEPARTMENT_OTHER): Payer: Medicare Other

## 2016-10-08 DIAGNOSIS — R079 Chest pain, unspecified: Secondary | ICD-10-CM | POA: Diagnosis not present

## 2016-10-08 DIAGNOSIS — I480 Paroxysmal atrial fibrillation: Secondary | ICD-10-CM | POA: Diagnosis not present

## 2016-10-08 DIAGNOSIS — I5042 Chronic combined systolic (congestive) and diastolic (congestive) heart failure: Secondary | ICD-10-CM | POA: Diagnosis not present

## 2016-10-08 DIAGNOSIS — R001 Bradycardia, unspecified: Secondary | ICD-10-CM

## 2016-10-08 DIAGNOSIS — R072 Precordial pain: Secondary | ICD-10-CM | POA: Diagnosis not present

## 2016-10-08 LAB — CBC
HEMATOCRIT: 36.8 % — AB (ref 39.0–52.0)
Hemoglobin: 12.3 g/dL — ABNORMAL LOW (ref 13.0–17.0)
MCH: 31.4 pg (ref 26.0–34.0)
MCHC: 33.4 g/dL (ref 30.0–36.0)
MCV: 93.9 fL (ref 78.0–100.0)
PLATELETS: 106 10*3/uL — AB (ref 150–400)
RBC: 3.92 MIL/uL — ABNORMAL LOW (ref 4.22–5.81)
RDW: 14.2 % (ref 11.5–15.5)
WBC: 4.7 10*3/uL (ref 4.0–10.5)

## 2016-10-08 LAB — ECHOCARDIOGRAM COMPLETE
HEIGHTINCHES: 70 in
WEIGHTICAEL: 3364.8 [oz_av]

## 2016-10-08 LAB — APTT: aPTT: 114 seconds — ABNORMAL HIGH (ref 24–36)

## 2016-10-08 LAB — HEPARIN LEVEL (UNFRACTIONATED)
Heparin Unfractionated: 0.69 IU/mL (ref 0.30–0.70)
Heparin Unfractionated: 0.58 IU/mL (ref 0.30–0.70)

## 2016-10-08 MED ORDER — PANTOPRAZOLE SODIUM 40 MG PO TBEC: 40.0000 mg | DELAYED_RELEASE_TABLET | Freq: Every day | ORAL | 1 refills | Status: DC

## 2016-10-08 MED ORDER — ENSURE ENLIVE PO LIQD: 237.0000 mL | Freq: Two times a day (BID) | ORAL | 12 refills | Status: DC

## 2016-10-08 MED ORDER — FOLIC ACID 1 MG PO TABS: 1.0000 mg | ORAL_TABLET | Freq: Every day | ORAL | 1 refills | Status: DC

## 2016-10-08 NOTE — Progress Notes (Signed)
The patient has been seen in conjunction with .Vin Bhagat, PAC All aspects of care have been considered and discussed. The patient has been personally interviewed, examined, and all clinical data has been reviewed.   The note from Dr. [place] was carefully reviewed. Myocardial infarction has been excluded. Bradycardia noted and appropriately, Toprol was decreased.  History is vague.  Echo is still pending. He does have a systolic murmur and suspect some degree of worsening aortic stenosis.  Plan discharge today and consider outpatient myocardial perfusion imaging. With [facility] pharmacologic study as an outpatient, the beta blocker should be held on the morning of the procedure to avoid excessive bradycardia.  It is okay to discharge the patient   Patient Name: Aaron Berg Date of Encounter: 10/08/2016  Primary Cardiologist: Dr. Laurena Bering Problem List     Principal Problem:   Precordial chest pain Active Problems:   Sleep apnea   Chronic combined systolic and diastolic CHF (congestive heart failure) (HCC)   Essential hypertension, benign   PAF (paroxysmal atrial fibrillation) (HCC)   Chest pain    Subjective   Feeling well. No chest pain, sob or palpitations.   Inpatient Medications    Scheduled Meds: . aspirin EC  325 mg Oral Daily  . buPROPion  150 mg Oral BID  . donepezil  23 mg Oral QHS  . feeding supplement (ENSURE ENLIVE)  237 mL Oral BID BM  . FLUoxetine  10 mg Oral Daily  . folic acid  1 mg Oral Daily  . loratadine  10 mg Oral Daily  . metoprolol succinate  12.5 mg Oral Daily  . multivitamin with minerals  1 tablet Oral Daily  . pantoprazole  40 mg Oral Daily  . rosuvastatin  40 mg Oral q1800  . thiamine  100 mg Oral Daily   Continuous Infusions: . heparin 900 Units/hr (10/07/16 2128)   PRN Meds: acetaminophen, LORazepam **OR** LORazepam, ondansetron (ZOFRAN) IV   Vital Signs    Vitals:   10/07/16 1306 10/07/16 2045 10/08/16 0426  10/08/16 0925  BP: 108/66 122/69 119/68 116/69  Pulse: (!) 50 (!) 51 (!) 54 (!) 52  Resp: 18 18 16    Temp: 98.2 F (36.8 C) 97.9 F (36.6 C) 97.5 F (36.4 C)   TempSrc: Oral Oral Oral   SpO2: 99% 99% 98%   Weight:      Height:        Intake/Output Summary (Last 24 hours) at 10/08/16 0937 Last data filed at 10/08/16 0431  Gross per 24 hour  Intake              480 ml  Output             1825 ml  Net            -1345 ml   Filed Weights   10/06/16 1608 10/06/16 2028  Weight: 209 lb (94.8 kg) 210 lb 4.8 oz (95.4 kg)    Physical Exam   GEN: Well nourished, well developed, in no acute distress.  HEENT: Grossly normal.  Neck: Supple, no JVD, carotid bruits, or masses. Cardiac: RRR, no murmurs, rubs, or gallops. No clubbing, cyanosis, edema.  Radials/DP/PT 2+ and equal bilaterally.  Respiratory:  Respirations regular and unlabored, clear to auscultation bilaterally. GI: Soft, nontender, nondistended, BS + x 4. MS: no deformity or atrophy. Skin: warm and dry, no rash. Neuro:  Strength and sensation are intact. Psych: AAOx3.  Normal affect.  Labs    CBC  Recent Labs  10/06/16 1715 10/07/16 0850 10/08/16 0236  WBC 5.1 4.2 4.7  NEUTROABS 3.3  --   --   HGB 12.9* 12.2* 12.3*  HCT 38.9* 36.7* 36.8*  MCV 94.2 94.6 93.9  PLT 125* 103* [id number]*   Basic Metabolic Panel  Recent Labs  10/06/16 1715  NA 140  K 3.9  CL 108  CO2 25  GLUCOSE 85  BUN 23*  CREATININE 1.09  CALCIUM 9.8   Liver Function Tests  Recent Labs  10/06/16 1715  AST 27  ALT 23  ALKPHOS 43  BILITOT 0.7  PROT 5.7*  ALBUMIN 3.4*   No results for input(s): LIPASE, AMYLASE in the last 72 hours. Cardiac Enzymes  Recent Labs  10/06/16 2323 10/07/16 0426 10/07/16 1157  TROPONINI <0.03 <0.03 <0.03   BNP Invalid input(s): POCBNP D-Dimer No results for input(s): DDIMER in the last 72 hours. Hemoglobin A1C No results for input(s): HGBA1C in the last 72 hours. Fasting Lipid Panel No  results for input(s): CHOL, HDL, LDLCALC, TRIG, CHOLHDL, LDLDIRECT in the last 72 hours. Thyroid Function Tests No results for input(s): TSH, T4TOTAL, T3FREE, THYROIDAB in the last 72 hours.  Invalid input(s): FREET3  Telemetry    Sinus rhythm at rate of 50s with PACs- Personally Reviewed  ECG    N/a  Radiology    Dg Chest 2 View  Result Date: 10/06/2016 CLINICAL DATA:  Chest discomfort today with right-sided chest spasm. No breathing complaints. EXAM: CHEST  2 VIEW COMPARISON:  06/30/2014 FINDINGS: Old bilateral rib fractures. No acute osseous abnormality. Right shoulder arthroplasty partially visualized. The heart is normal in size. There is atherosclerosis of the aortic arch without aneurysm. Chronic mild diffuse interstitial prominence without pneumonic consolidation, effusion or pneumothorax. IMPRESSION: Chronic stable changes of the chest without acute pulmonary disease. Aortic atherosclerosis. Electronically Signed   By: Ashley Royalty M.D.   On: 10/06/2016 18:28    Cardiac Studies   Pending echo this admission  Patient Profile     Aaron Berg is a 80 y.o. male with a history of CAD s/p CABG 123456, combined systolic and diastolic CHF, aortic stenosis, HTN, HL, OSA on CPAP, COPD, microvascular dementia,  PAF on Eliquis and CVA who presented with CP.  Assessment & Plan    1. Chest pain - Atypical. Troponin negative. EKG non ischemic. He has ruled out. Seems more GI related vs MSK as reproducible with palpation. Trial of PPI. Last myoview in 2012 was normal.  Echo still pending, can be done as outpatient. Likely go home once seen by Dr. Tamala Julian with outpatient echo and stress test.   2. Chronic combined systolic and diastolic CHF (congestive heart failure)  - Euvolemic.  3. Essential hypertension, benign - Stable and well controlled on current regimen  4.  PAF (paroxysmal atrial fibrillation) (HCC) - Maintaining sinus rhythm with PVCs at heart rate of 40-50s. Rate  minimally improved to 50s on low dose toprol XL to 12.5mg  - CHADSVASCs score of 7. Continue Eliquis for anticoagulation. Hx of multiple falls due to balance issue.   5. CAD s/p CABG - As above. Discontinue ASA due to need to anticoagulation. Continue statin. Scrip given for SL nitro.   6. HLD with LDL goal less than 70 - 01/09/2016: Cholesterol 191; HDL 56; LDL Cholesterol 85; Triglycerides 251; VLDL 50  - Continue statin.   Signed, Leanor Kail, PA  10/08/2016, 9:37 AM

## 2016-10-08 NOTE — Progress Notes (Signed)
HR dropped to 41. Will discontinued BB. Echo done, pending reading. Discharge later today.

## 2016-10-08 NOTE — Discharge Summary (Addendum)
Physician Discharge Summary  Aaron Berg MRN: [id number] DOB/AGE: 1936-04-29 80 y.o.  PCP: Wyatt Haste, MD   Admit date: 10/06/2016 Discharge date: 10/08/2016  Discharge Diagnoses:   Principal Problem:   Precordial chest pain Active Problems:   Sleep apnea   Chronic combined systolic and diastolic CHF (congestive heart failure) (HCC)   Essential hypertension, benign   PAF (paroxysmal atrial fibrillation) (HCC)   Chest pain    Follow-up recommendations Follow-up with PCP in 3-5 days , including all  additional recommended appointments as below Follow-up CBC, CMP in 3-5 days Cardiology recommends outpatient myocardial perfusion imaging, Lexiscan pharmacologic study as an outpatient      Current Discharge Medication List    START taking these medications   Details  feeding supplement, ENSURE ENLIVE, (ENSURE ENLIVE) LIQD Take 237 mLs by mouth 2 (two) times daily between meals. Qty: 237 mL, Refills: 12    folic acid (FOLVITE) 1 MG tablet Take 1 tablet (1 mg total) by mouth daily. Qty: 30 tablet, Refills: 1    nitroGLYCERIN (NITROSTAT) 0.4 MG SL tablet Place 1 tablet (0.4 mg total) under the tongue every 5 (five) minutes as needed for chest pain. Qty: 25 tablet, Refills: 12    pantoprazole (PROTONIX) 40 MG tablet Take 1 tablet (40 mg total) by mouth daily. Qty: 30 tablet, Refills: 1      CONTINUE these medications which have NOT CHANGED   Details  apixaban (ELIQUIS) 5 MG TABS tablet Take 1 tablet (5 mg total) by mouth 2 (two) times daily. Qty: 180 tablet, Refills: 3    buPROPion (WELLBUTRIN SR) 150 MG 12 hr tablet TAKE 1 TABLET TWICE A DAY Qty: 180 tablet, Refills: 0    cholecalciferol (VITAMIN D) 1000 UNITS tablet Take 1,000 Units by mouth daily.     donepezil (ARICEPT) 23 MG TABS tablet Take 1 tablet (23 mg total) by mouth at bedtime. Qty: 90 tablet, Refills: 1    fexofenadine (ALLEGRA) 180 MG tablet Take 180 mg by mouth daily.    FLUoxetine  (PROZAC) 10 MG capsule TAKE 1 CAPSULE DAILY Qty: 90 capsule, Refills: 0    Multiple Vitamins-Minerals (MULTIVITAMIN WITH MINERALS) tablet Take 1 tablet by mouth daily. Reported on 01/30/2016    rosuvastatin (CRESTOR) 40 MG tablet TAKE 1 TABLET DAILY WITH BREAKFAST FOR HYPERLIPIDEMIA (KEEP UPCOMING APPOINTMENT IN ORDER TO RECEIVE ADDITIONAL REFILLS) Qty: 90 tablet, Refills: 3    sodium chloride (OCEAN) 0.65 % SOLN nasal spray Place 1 spray into both nostrils daily as needed for congestion.     Testosterone 20.25 MG/1.25GM (1.62%) GEL Apply 2 Squirts topically daily as needed (occasionally). One pump per shoulder.      STOP taking these medications     metoprolol succinate (TOPROL-XL) 25 MG 24 hr tablet      naproxen sodium (ANAPROX) 220 MG tablet          Discharge Condition:Stable   Discharge Instructions Get Medicines reviewed and adjusted: Please take all your medications with you for your next visit with your Primary MD  Please request your Primary MD to go over all hospital tests and procedure/radiological results at the follow up, please ask your Primary MD to get all Hospital records sent to his/her office.  If you experience worsening of your admission symptoms, develop shortness of breath, life threatening emergency, suicidal or homicidal thoughts you must seek medical attention immediately by calling 911 or calling your MD immediately if symptoms less severe.  You must read complete instructions/literature along with  all the possible adverse reactions/side effects for all the Medicines you take and that have been prescribed to you. Take any new Medicines after you have completely understood and accpet all the possible adverse reactions/side effects.   Do not drive when taking Pain medications.   Do not take more than prescribed Pain, Sleep and Anxiety Medications  Special Instructions: If you have smoked or chewed Tobacco in the last 2 yrs please stop smoking, stop  any regular Alcohol and or any Recreational drug use.  Wear Seat belts while driving.  Please note  You were cared for by a hospitalist during your hospital stay. Once you are discharged, your primary care physician will handle any further medical issues. Please note that NO REFILLS for any discharge medications will be authorized once you are discharged, as it is imperative that you return to your primary care physician (or establish a relationship with a primary care physician if you do not have one) for your aftercare needs so that they can reassess your need for medications and monitor your lab values.  Discharge Instructions    Diet - low sodium heart healthy    Complete by:  As directed    Increase activity slowly    Complete by:  As directed        Allergies  Allergen Reactions  . Procardia [Nifedipine] Rash    All over body      Disposition: 01-Home or Self Care   Consults:  Cardiology    Significant Diagnostic Studies:  Dg Chest 2 View  Result Date: 10/06/2016 CLINICAL DATA:  Chest discomfort today with right-sided chest spasm. No breathing complaints. EXAM: CHEST  2 VIEW COMPARISON:  06/30/2014 FINDINGS: Old bilateral rib fractures. No acute osseous abnormality. Right shoulder arthroplasty partially visualized. The heart is normal in size. There is atherosclerosis of the aortic arch without aneurysm. Chronic mild diffuse interstitial prominence without pneumonic consolidation, effusion or pneumothorax. IMPRESSION: Chronic stable changes of the chest without acute pulmonary disease. Aortic atherosclerosis. Electronically Signed   By: Ashley Royalty M.D.   On: 10/06/2016 18:28       Filed Weights   10/06/16 1608 10/06/16 2028  Weight: 94.8 kg (209 lb) 95.4 kg (210 lb 4.8 oz)     Microbiology: No results found for this or any previous visit (from the past 240 hour(s)).     Blood Culture No results found for: SDES, SPECREQUEST, CULT,  REPTSTATUS    Labs: Results for orders placed or performed during the hospital encounter of 10/06/16 (from the past 48 hour(s))  CBC with Differential     Status: Abnormal   Collection Time: 10/06/16  5:15 PM  Result Value Ref Range   WBC 5.1 4.0 - 10.5 K/uL   RBC 4.13 (L) 4.22 - 5.81 MIL/uL   Hemoglobin 12.9 (L) 13.0 - 17.0 g/dL   HCT 38.9 (L) 39.0 - 52.0 %   MCV 94.2 78.0 - 100.0 fL   MCH 31.2 26.0 - 34.0 pg   MCHC 33.2 30.0 - 36.0 g/dL   RDW 14.0 11.5 - 15.5 %   Platelets 125 (L) 150 - 400 K/uL   Neutrophils Relative % 65 %   Neutro Abs 3.3 1.7 - 7.7 K/uL   Lymphocytes Relative 23 %   Lymphs Abs 1.2 0.7 - 4.0 K/uL   Monocytes Relative 10 %   Monocytes Absolute 0.5 0.1 - 1.0 K/uL   Eosinophils Relative 2 %   Eosinophils Absolute 0.1 0.0 - 0.7 K/uL  Basophils Relative 0 %   Basophils Absolute 0.0 0.0 - 0.1 K/uL  Comprehensive metabolic panel     Status: Abnormal   Collection Time: 10/06/16  5:15 PM  Result Value Ref Range   Sodium 140 135 - 145 mmol/L   Potassium 3.9 3.5 - 5.1 mmol/L   Chloride 108 101 - 111 mmol/L   CO2 25 22 - 32 mmol/L   Glucose, Bld 85 65 - 99 mg/dL   BUN 23 (H) 6 - 20 mg/dL   Creatinine, Ser 1.09 0.61 - 1.24 mg/dL   Calcium 9.8 8.9 - 10.3 mg/dL   Total Protein 5.7 (L) 6.5 - 8.1 g/dL   Albumin 3.4 (L) 3.5 - 5.0 g/dL   AST 27 15 - 41 U/L   ALT 23 17 - 63 U/L   Alkaline Phosphatase 43 38 - 126 U/L   Total Bilirubin 0.7 0.3 - 1.2 mg/dL   GFR calc non Af Amer >60 >60 mL/min   GFR calc Af Amer >60 >60 mL/min    Comment: (NOTE) The eGFR has been calculated using the CKD EPI equation. This calculation has not been validated in all clinical situations. eGFR's persistently <60 mL/min signify possible Chronic Kidney Disease.    Anion gap 7 5 - 15  I-Stat Troponin, ED (not at [facility])     Status: None   Collection Time: 10/06/16  5:29 PM  Result Value Ref Range   Troponin i, poc 0.01 0.00 - 0.08 ng/mL   Comment 3            Comment: Due to the  release kinetics of cTnI, a negative result within the first hours of the onset of symptoms does not rule out myocardial infarction with certainty. If myocardial infarction is still suspected, repeat the test at appropriate intervals.   Troponin I (q 6hr x 3)     Status: None   Collection Time: 10/06/16 11:23 PM  Result Value Ref Range   Troponin I <0.03 <0.03 ng/mL  Troponin I (q 6hr x 3)     Status: None   Collection Time: 10/07/16  4:26 AM  Result Value Ref Range   Troponin I <0.03 <0.03 ng/mL  Heparin level (unfractionated)     Status: Abnormal   Collection Time: 10/07/16  7:21 AM  Result Value Ref Range   Heparin Unfractionated 1.64 (H) 0.30 - 0.70 IU/mL    Comment: RESULTS CONFIRMED BY MANUAL DILUTION        IF HEPARIN RESULTS ARE BELOW EXPECTED VALUES, AND PATIENT DOSAGE HAS BEEN CONFIRMED, SUGGEST FOLLOW UP TESTING OF ANTITHROMBIN III LEVELS.   APTT     Status: Abnormal   Collection Time: 10/07/16  8:50 AM  Result Value Ref Range   aPTT >200 (HH) 24 - 36 seconds    Comment:        IF BASELINE aPTT IS ELEVATED, SUGGEST PATIENT RISK ASSESSMENT BE USED TO DETERMINE APPROPRIATE ANTICOAGULANT THERAPY. REPEATED TO VERIFY CRITICAL RESULT CALLED TO, READ BACK BY AND VERIFIED WITH: KATRINA GUNTER,RN AT 0945 10/07/16 BY ZBEECH.   CBC     Status: Abnormal   Collection Time: 10/07/16  8:50 AM  Result Value Ref Range   WBC 4.2 4.0 - 10.5 K/uL   RBC 3.88 (L) 4.22 - 5.81 MIL/uL   Hemoglobin 12.2 (L) 13.0 - 17.0 g/dL   HCT 36.7 (L) 39.0 - 52.0 %   MCV 94.6 78.0 - 100.0 fL   MCH 31.4 26.0 - 34.0 pg   MCHC  33.2 30.0 - 36.0 g/dL   RDW 14.4 11.5 - 15.5 %   Platelets 103 (L) 150 - 400 K/uL    Comment: SPECIMEN CHECKED FOR CLOTS REPEATED TO VERIFY PLATELET COUNT CONFIRMED BY SMEAR   Troponin I (q 6hr x 3)     Status: None   Collection Time: 10/07/16 11:57 AM  Result Value Ref Range   Troponin I <0.03 <0.03 ng/mL  APTT     Status: Abnormal   Collection Time: 10/07/16   6:29 PM  Result Value Ref Range   aPTT 154 (H) 24 - 36 seconds    Comment:        IF BASELINE aPTT IS ELEVATED, SUGGEST PATIENT RISK ASSESSMENT BE USED TO DETERMINE APPROPRIATE ANTICOAGULANT THERAPY.   Heparin level (unfractionated)     Status: None   Collection Time: 10/08/16  2:36 AM  Result Value Ref Range   Heparin Unfractionated 0.69 0.30 - 0.70 IU/mL    Comment:        IF HEPARIN RESULTS ARE BELOW EXPECTED VALUES, AND PATIENT DOSAGE HAS BEEN CONFIRMED, SUGGEST FOLLOW UP TESTING OF ANTITHROMBIN III LEVELS.   APTT     Status: Abnormal   Collection Time: 10/08/16  2:36 AM  Result Value Ref Range   aPTT 114 (H) 24 - 36 seconds    Comment:        IF BASELINE aPTT IS ELEVATED, SUGGEST PATIENT RISK ASSESSMENT BE USED TO DETERMINE APPROPRIATE ANTICOAGULANT THERAPY.   CBC     Status: Abnormal   Collection Time: 10/08/16  2:36 AM  Result Value Ref Range   WBC 4.7 4.0 - 10.5 K/uL   RBC 3.92 (L) 4.22 - 5.81 MIL/uL   Hemoglobin 12.3 (L) 13.0 - 17.0 g/dL   HCT 36.8 (L) 39.0 - 52.0 %   MCV 93.9 78.0 - 100.0 fL   MCH 31.4 26.0 - 34.0 pg   MCHC 33.4 30.0 - 36.0 g/dL   RDW 14.2 11.5 - 15.5 %   Platelets 106 (L) 150 - 400 K/uL    Comment: CONSISTENT WITH PREVIOUS RESULT  Heparin level (unfractionated)     Status: None   Collection Time: 10/08/16 10:17 AM  Result Value Ref Range   Heparin Unfractionated 0.58 0.30 - 0.70 IU/mL    Comment:        IF HEPARIN RESULTS ARE BELOW EXPECTED VALUES, AND PATIENT DOSAGE HAS BEEN CONFIRMED, SUGGEST FOLLOW UP TESTING OF ANTITHROMBIN III LEVELS.      Lipid Panel     Component Value Date/Time   CHOL 191 01/09/2016 0001   TRIG 251 (H) 01/09/2016 0001   HDL 56 01/09/2016 0001   CHOLHDL 3.4 01/09/2016 0001   VLDL 50 (H) 01/09/2016 0001   LDLCALC 85 01/09/2016 0001     Lab Results  Component Value Date   HGBA1C 6.1 (H) 08/15/2014     Lab Results  Component Value Date   LDLCALC 85 01/09/2016   CREATININE 1.09 10/06/2016      HPI :   Aaron Berg is a 80 y.o. male with a history of CAD s/p CABG 1914, combined systolic and diastolic CHF, aortic stenosis, HTN, HL, OSA on CPAP, COPD, microvascular dementia,  PAF on Eliquis and CVA who presented with CP.  He was doing well on cardiac stand point when last seen by Dr. Tamala Julian 05/15/2016.  Last echo 04/2014 showed LV EF of 45- 50%, mild concentric hypertrophy, mild AS with mean gradient: 22m Hg (S). Peakgradient: 217mHg (S),  aortic root dilation to 52m, ascending aortic dilation to 422m mild dilated RV,, mild dilated RA, elevated central venous pressure.   Having memory and balance issue with hx of multiple falls.  This is limiting his ambulation. He uses walker at home.   The patient presented to ER 10/06/16 with 1-2 days hx of chest pain. He describes the pain as "dully achy" sensation over sternum and its intermittent lasting for minutes to hours. Denies associated dyspnea, diaphoresis, nausea or vomiting. No radiation of pain. He does feels abdominal fullness and burping sensation. Denies LE edema, orthopnea, PND, syncope, palpitations. This pain in different from prior cardiac pain.   EKG showed sinus bradycardia at rate of 56 bmp with PVCs. No acute ischemic changes. Troponin negative x 2. Electrolytes and Scr normal  HOSPITAL COURSE:    1. Chest painconcerning for angina - patient is presentlychest pain-free. Negative cardiac markers, cardiology recommends to check 2-D echo results still pending . Patient is on aspirin and statins and beta blocker has been discontinued due to bradycardia. Cardiology ruled out myocardial infarction. The recommend outpatient pharmacologic imaging. Beta blocker discontinued due to bradycardia in the 40s. 2.    Paroxysmal atrial fibrillation-   Resume apixaban. Cardiology discontinued metoprolol given bradycardia.CHADSVASCs score of 7 3. Hypertension beta blocker discontinued, currently blood pressure stable 4. Sleep  apneaon CPAP. 5. Anemia- follow CBC and further workup as outpatient. 6. Thrombocytopeniaprobably secondary to alcoholism - follow CBC.  Platelet count 106-125 this admission  7. Chronic combined systolic and diastolic CHFlast EF measured in 2015 was 45-50% - appears euvolemic. 8. History of strokeon anticoagulation and statins.01/09/2016: Cholesterol 191; HDL 56; LDL Cholesterol 85; Triglycerides 251; VLDL 50   Discharge Exam: *  Blood pressure 116/69, pulse (!) 52, temperature 97.5 F (36.4 C), temperature source Oral, resp. rate 16, height _0  (1.778 m), weight 95.4 kg (210 lb 4.8 oz), SpO2 98 %.  GEN: Well nourished, well developed, in no acute distress.  HEENT: Grossly normal.  Neck: Supple, no JVD, carotid bruits, or masses. Cardiac: RRR, no murmurs, rubs, or gallops. No clubbing, cyanosis, edema.  Radials/DP/PT 2+ and equal bilaterally.  Respiratory:  Respirations regular and unlabored, clear to auscultation bilaterally. GI: Soft, nontender, nondistended, BS + x 4. MS: no deformity or atrophy. Skin: warm and dry, no rash. Neuro:  Strength and sensation are intact. Psych: AAOx3.  Normal affect.    Follow-up Information    [facility].   Specialty:  Cardiology Why:  _1 :45am for stress test Contact information: [address]     LaCecilie KicksNP. Go on 10/20/2016.   Specialties:  Cardiology, Radiology Why:  _2 :00 am for post hospital  Contact information: [phone]        LAWyatt HasteMD. Schedule an appointment as soon as possible for a visit in 2 day(s).   Specialty:  Family Medicine Why:  Hospital follow-up Contact information: [address] [phone]         Signed: ABReyne Dumas0/25/2017, 12:43 PM        Time spent >45 mins '

## 2016-10-08 NOTE — Progress Notes (Addendum)
[facility] for heparin Indication: atrial fibrillation  Allergies  Allergen Reactions  . Procardia [Nifedipine] Rash    All over body    Patient Measurements: Height: 5\' 10"  (177.8 cm) Weight: 210 lb 4.8 oz (95.4 kg) IBW/kg (Calculated) : 73 Heparin Dosing Weight: 95kg  Vital Signs: Temp: 97.5 F (36.4 C) (10/25 0426) Temp Source: Oral (10/25 0426) BP: 119/68 (10/25 0426) Pulse Rate: 54 (10/25 0426)  Labs:  Recent Labs  10/06/16 1715 10/06/16 2323 10/07/16 0426 10/07/16 0721 10/07/16 0850 10/07/16 1157 10/07/16 1829 10/08/16 0236  HGB 12.9*  --   --   --  12.2*  --   --  12.3*  HCT 38.9*  --   --   --  36.7*  --   --  36.8*  PLT 125*  --   --   --  103*  --   --  106*  APTT  --   --   --   --  >200*  --  154* 114*  HEPARINUNFRC  --   --   --  1.64*  --   --   --  0.69  CREATININE 1.09  --   --   --   --   --   --   --   TROPONINI  --  <0.03 <0.03  --   --  <0.03  --   --     Estimated Creatinine Clearance: 64.8 mL/min (by C-G formula based on SCr of 1.09 mg/dL).   Medical History: Past Medical History:  Diagnosis Date  . Arthritis    "thumbs, wrists, legs" (10/06/2016)  . Atrial flutter ([place])   . CAD (coronary artery disease) of bypass graft   . Cerebrovascular disease 2006   multiple ischemic changes on prior MRI  . CHF (congestive heart failure) ([place])   . Chronic lower back pain   . Chronic systolic dysfunction of left ventricle   . Complication of anesthesia    "woke up too soon"  . COPD (chronic obstructive pulmonary disease) ([place])    "no signs or tests"  . Depression   . Dry eyes   . Dyslipidemia    well controlled  . Gait disorder   . Heart murmur    "found in ~ 1946"  . Hypertension   . Hypertensive cardiovascular disease   . Male hypogonadism   . Memory loss, short term 2/15   per daughter since last surgery and placement at [facility].  "Hallucinations have stopped"  . Migraine    silent migraines-  no pain just go blind for just a minute  . Migraine    "major one when I was a kid; very painful"  . Multiple falls    "after both knees surgeries; went to rehab; came out and fell all the time" (10/06/2016)  . Myocardial infarction 2004   "was told I'd had one that I didn't know I'd had; ended up getting OHS"  . Obesity   . OSA on CPAP    cpap  . Persistent atrial fibrillation ([place]) 2003  . Skin cancer of anterior chest   . Spinal stenosis     Medications:  Prescriptions Prior to Admission  Medication Sig Dispense Refill Last Dose  . apixaban (ELIQUIS) 5 MG TABS tablet Take 1 tablet (5 mg total) by mouth 2 (two) times daily. 180 tablet 3 10/06/2016 at 0900  . buPROPion (WELLBUTRIN SR) 150 MG 12 hr tablet TAKE 1 TABLET TWICE A DAY 180  tablet 0 10/06/2016 at Unknown time  . cholecalciferol (VITAMIN D) 1000 UNITS tablet Take 1,000 Units by mouth daily.    10/06/2016 at Unknown time  . donepezil (ARICEPT) 23 MG TABS tablet Take 1 tablet (23 mg total) by mouth at bedtime. 90 tablet 1 10/05/2016 at Unknown time  . fexofenadine (ALLEGRA) 180 MG tablet Take 180 mg by mouth daily.   10/06/2016 at Unknown time  . FLUoxetine (PROZAC) 10 MG capsule TAKE 1 CAPSULE DAILY 90 capsule 0 10/06/2016 at Unknown time  . metoprolol succinate (TOPROL-XL) 25 MG 24 hr tablet Take 1 tablet (25 mg total) by mouth daily. 90 tablet 11 10/06/2016 at 0900  . Multiple Vitamins-Minerals (MULTIVITAMIN WITH MINERALS) tablet Take 1 tablet by mouth daily. Reported on 01/30/2016   10/06/2016 at Unknown time  . naproxen sodium (ANAPROX) 220 MG tablet Take 220 mg by mouth 2 (two) times daily as needed (pain). Reported on 01/30/2016   10/06/2016 at Unknown time  . rosuvastatin (CRESTOR) 40 MG tablet TAKE 1 TABLET DAILY WITH BREAKFAST FOR HYPERLIPIDEMIA (KEEP UPCOMING APPOINTMENT IN ORDER TO RECEIVE ADDITIONAL REFILLS) 90 tablet 3 10/06/2016 at Unknown time  . sodium chloride (OCEAN) 0.65 % SOLN nasal spray Place 1 spray into both  nostrils daily as needed for congestion.    Past Month at Unknown time  . Testosterone 20.25 MG/1.25GM (1.62%) GEL Apply 2 Squirts topically daily as needed (occasionally). One pump per shoulder.   Past Week at Unknown time   Scheduled:  . aspirin EC  325 mg Oral Daily  . buPROPion  150 mg Oral BID  . donepezil  23 mg Oral QHS  . feeding supplement (ENSURE ENLIVE)  237 mL Oral BID BM  . FLUoxetine  10 mg Oral Daily  . folic acid  1 mg Oral Daily  . loratadine  10 mg Oral Daily  . metoprolol succinate  12.5 mg Oral Daily  . multivitamin with minerals  1 tablet Oral Daily  . pantoprazole  40 mg Oral Daily  . rosuvastatin  40 mg Oral q1800  . thiamine  100 mg Oral Daily   Or  . thiamine  100 mg Intravenous Daily    Assessment: 80yo male on Eliquis PTA for Afib, transitioned to heparin in anticipation of possible cardiac procedure; last Eliquis taken 10/23 at 0900. Hg low stable, plt up slightly 106, no bleed documented.  Confirmatory heparin level remains therapeutic (0.58) on 900 units/h.   Goal of Therapy:  Heparin level 0.3-0.7 units/ml aPTT 66-102 seconds Monitor platelets by anticoagulation protocol: Yes   Plan:  Heparin at 900 units/h Daily heparin level/CBC Monitor for s/sx bleeding Eliquis on hold - F/u Cards plans   Elicia Lamp, PharmD, BCPS Clinical Pharmacist 10/08/2016 8:49 AM

## 2016-10-08 NOTE — Progress Notes (Signed)
Pt. disconnected IV, took of tele and asked me to hurry up with discharge paperwork. I got everything done and took his iv out then he told me he does not have a ride, he just wanted the stuff off him. I explained to him that this is not protocol that it is important to keep IV access and tele on while in the hospital. He then said he needed a taxi cab.  Pt. Discharged  Pt. D/C'd via taxi Discharge information reviewed and given to patient with teachback All personal belongings given to Pt.  Education discussed IV was d/c Tele d/c

## 2016-10-08 NOTE — Progress Notes (Signed)
  Echocardiogram 2D Echocardiogram has been performed.  Tresa Res 10/08/2016, 11:32 AM

## 2016-10-08 NOTE — Progress Notes (Signed)
ANTICOAGULATION CONSULT NOTE - Follow Up Consult  Pharmacy Consult for heparin Indication: atrial fibrillation  Labs:  Recent Labs  10/06/16 1715 10/06/16 2323 10/07/16 0426 10/07/16 0721 10/07/16 0850 10/07/16 1157 10/07/16 1829 10/08/16 0236  HGB 12.9*  --   --   --  12.2*  --   --  12.3*  HCT 38.9*  --   --   --  36.7*  --   --  36.8*  PLT 125*  --   --   --  103*  --   --  106*  APTT  --   --   --   --  >200*  --  154* 114*  HEPARINUNFRC  --   --   --  1.64*  --   --   --  0.69  CREATININE 1.09  --   --   --   --   --   --   --   TROPONINI  --  <0.03 <0.03  --   --  <0.03  --   --      Assessment/Plan:  80yo male therapeutic on heparin after rate change. Will continue gtt at current rate and confirm stable with additional level.   Wynona Neat, PharmD, BCPS  10/08/2016,3:54 AM

## 2016-10-09 ENCOUNTER — Telehealth: Payer: Self-pay | Admitting: Family Medicine

## 2016-10-09 NOTE — Telephone Encounter (Signed)
Left message for pt to call. Needs a hospital follow up appt.  °

## 2016-10-10 DIAGNOSIS — F0390 Unspecified dementia without behavioral disturbance: Secondary | ICD-10-CM | POA: Diagnosis not present

## 2016-10-10 DIAGNOSIS — R251 Tremor, unspecified: Secondary | ICD-10-CM | POA: Diagnosis not present

## 2016-10-10 NOTE — Telephone Encounter (Signed)
Call again and left message for pt to call. Needs a hospital follow up appt.

## 2016-10-14 DIAGNOSIS — R251 Tremor, unspecified: Secondary | ICD-10-CM | POA: Diagnosis not present

## 2016-10-14 DIAGNOSIS — F0390 Unspecified dementia without behavioral disturbance: Secondary | ICD-10-CM | POA: Diagnosis not present

## 2016-10-15 DIAGNOSIS — R251 Tremor, unspecified: Secondary | ICD-10-CM | POA: Diagnosis not present

## 2016-10-15 DIAGNOSIS — F0390 Unspecified dementia without behavioral disturbance: Secondary | ICD-10-CM | POA: Diagnosis not present

## 2016-10-16 DIAGNOSIS — F0390 Unspecified dementia without behavioral disturbance: Secondary | ICD-10-CM | POA: Diagnosis not present

## 2016-10-16 DIAGNOSIS — R251 Tremor, unspecified: Secondary | ICD-10-CM | POA: Diagnosis not present

## 2016-10-17 ENCOUNTER — Encounter (HOSPITAL_COMMUNITY): Payer: Medicare Other

## 2016-10-17 DIAGNOSIS — F0390 Unspecified dementia without behavioral disturbance: Secondary | ICD-10-CM | POA: Diagnosis not present

## 2016-10-17 DIAGNOSIS — R251 Tremor, unspecified: Secondary | ICD-10-CM | POA: Diagnosis not present

## 2016-10-20 ENCOUNTER — Ambulatory Visit: Payer: Medicare Other | Admitting: Cardiology

## 2016-10-21 DIAGNOSIS — F0390 Unspecified dementia without behavioral disturbance: Secondary | ICD-10-CM | POA: Diagnosis not present

## 2016-10-21 DIAGNOSIS — R251 Tremor, unspecified: Secondary | ICD-10-CM | POA: Diagnosis not present

## 2016-10-22 ENCOUNTER — Encounter: Payer: Self-pay | Admitting: Physician Assistant

## 2016-10-22 ENCOUNTER — Telehealth: Payer: Self-pay | Admitting: Family Medicine

## 2016-10-22 ENCOUNTER — Telehealth: Payer: Self-pay | Admitting: Interventional Cardiology

## 2016-10-22 DIAGNOSIS — R251 Tremor, unspecified: Secondary | ICD-10-CM | POA: Diagnosis not present

## 2016-10-22 DIAGNOSIS — F0390 Unspecified dementia without behavioral disturbance: Secondary | ICD-10-CM | POA: Diagnosis not present

## 2016-10-22 NOTE — Progress Notes (Signed)
Cardiology Office Note    Date:  10/23/2016  ID:  Aaron Berg, DOB Mar 27, 1936, MRN [id number] PCP:  Wyatt Haste, MD  Cardiologist:  Tamala Julian   Chief Complaint: irregular HR  History of Present Illness:  Aaron Berg is a 80 y.o. male with history of CAD s/p CABG 2003, chronic combined CHF, mild aortic stenosis/mitral regurg by echo 09/2016, HTN, HLD, paroxysmal atrial fib on Eliquis, OSA on CPAP, COPD, microvascular dementia, CVA, falls who presents for evaluation of irregular HR noted by [facility]. Per phone notes the patient was noted to have HR in the upper 40s and low 50s with irregularly felt. Recent adm 09/2016 for chest pain, atypical, ruled out for MI. During that say he was noted to maintain NSR with PVCs with HR 40s-50s, the bradycardia prompting reduction in beta blocker therapy and ultimate discontinuation given HR 41. 2D echo 10/08/16: EF 40-45%, grade 2 DD, mild aortic stenosis, mild mitral regurgitation. Outpatient nuc was recommended. Labs notable for Hgb 12.3, Cr 1.09, K 3.9.  He presents back to clinic with his daughter who is a Marine scientist. He feels good today without recurrent CP. Has had rare brief recurrences. No SOB. Walks with a walker or cane depending on the situation. He reports longstanding history of PVCs described as a skipped heartbeat but not particularly bothersome to him. He has not had any recent worsening dizziness, presyncope or syncope. He does have a lot of abdominal gas - this was previously improved with Protonix but they recently realized this is not due to be shipped by mail order pharmacy until later in November. Denies a recent fall.   Past Medical History:  Diagnosis Date  . Aortic stenosis   . Arthritis    "thumbs, wrists, legs" (10/06/2016)  . Atrial flutter ([place])   . CAD (coronary artery disease)    a. s/p CABG 2003.  [name] Cerebrovascular disease 2006   multiple ischemic changes on prior MRI  . Chronic combined systolic and diastolic CHF  (congestive heart failure) ([place])   . Chronic lower back pain   . Complication of anesthesia    "woke up too soon"  . COPD (chronic obstructive pulmonary disease) ([place])    "no signs or tests"  . CVA (cerebral vascular accident) ([place])   . Dementia   . Depression   . Dry eyes   . Dyslipidemia    well controlled  . Gait disorder   . Heart murmur    "found in ~ 1946"  . Hypertension   . Hypertensive cardiovascular disease   . Male hypogonadism   . Migraine    "major one when I was a kid; very painful"  . Mitral regurgitation   . Multiple falls    "after both knees surgeries; went to rehab; came out and fell all the time" (10/06/2016)  . Obesity   . OSA on CPAP    cpap  . Paroxysmal atrial fibrillation (HCC)   . Skin cancer of anterior chest   . Spinal stenosis     Past Surgical History:  Procedure Laterality Date  . APPENDECTOMY  as child  . CARDIAC CATHETERIZATION  11/2003   Archie Endo 04/29/2011  . CARDIOVERSION N/A 04/26/2014   Procedure: CARDIOVERSION;  Surgeon: Sinclair Grooms, MD;  Location: [facility] ENDOSCOPY;  Service: Cardiovascular;  Laterality: N/A;  . CARDIOVERSION N/A 05/24/2014   Procedure: CARDIOVERSION;  Surgeon: Sinclair Grooms, MD;  Location: [place];  Service: Cardiovascular;  Laterality: N/A;  . CATARACT EXTRACTION  W/ INTRAOCULAR LENS  IMPLANT, BILATERAL Bilateral 2000s  . CORONARY ANGIOPLASTY  1991   "no stents" (10/06/2016)  . CORONARY ARTERY BYPASS GRAFT  11/2002   X 3/notes 04/29/2011  . CYST EXCISION     lumbar; Dr. Joya Salm  . EYE SURGERY Right    detached retina  . JOINT REPLACEMENT    . MOHS SURGERY     on chest  . RETINAL DETACHMENT SURGERY Right   . SHOULDER SURGERY Right    Right with murphy, reattach ligaments  . TONSILLECTOMY  as child  . TOTAL KNEE ARTHROPLASTY Right 01/02/2014   Procedure: RIGHT TOTAL KNEE ARTHROPLASTY;  Surgeon: Mauri Pole, MD;  Location: [facility] ORS;  Service: Orthopedics;  Laterality: Right;  . TOTAL KNEE ARTHROPLASTY  Left 01/31/2014   Procedure: LEFT TOTAL KNEE ARTHROPLASTY;  Surgeon: Mauri Pole, MD;  Location: [facility] ORS;  Service: Orthopedics;  Laterality: Left;  . TOTAL SHOULDER ARTHROPLASTY Right   . VIDEO ASSISTED THORACOSCOPY (VATS)/THOROCOTOMY  2003   benign nodule    Current Medications: Current Outpatient Prescriptions  Medication Sig Dispense Refill  . apixaban (ELIQUIS) 5 MG TABS tablet Take 1 tablet (5 mg total) by mouth 2 (two) times daily. 180 tablet 3  . buPROPion (WELLBUTRIN SR) 150 MG 12 hr tablet TAKE 1 TABLET TWICE A DAY 180 tablet 0  . cholecalciferol (VITAMIN D) 1000 UNITS tablet Take 1,000 Units by mouth daily.     [name] donepezil (ARICEPT) 23 MG TABS tablet Take 1 tablet (23 mg total) by mouth at bedtime. 90 tablet 1  . feeding supplement, ENSURE ENLIVE, (ENSURE ENLIVE) LIQD Take 237 mLs by mouth 2 (two) times daily between meals. 237 mL 12  . fexofenadine (ALLEGRA) 180 MG tablet Take 180 mg by mouth daily.    [name] FLUoxetine (PROZAC) 10 MG capsule TAKE 1 CAPSULE DAILY 90 capsule 0  . folic acid (FOLVITE) 1 MG tablet Take 1 tablet (1 mg total) by mouth daily. 30 tablet 1  . Multiple Vitamins-Minerals (MULTIVITAMIN WITH MINERALS) tablet Take 1 tablet by mouth daily. Reported on 01/30/2016    . nitroGLYCERIN (NITROSTAT) 0.4 MG SL tablet Place 1 tablet (0.4 mg total) under the tongue every 5 (five) minutes as needed for chest pain. 25 tablet 12  . pantoprazole (PROTONIX) 40 MG tablet Take 1 tablet (40 mg total) by mouth daily. 30 tablet 1  . rosuvastatin (CRESTOR) 40 MG tablet TAKE 1 TABLET DAILY WITH BREAKFAST FOR HYPERLIPIDEMIA (KEEP UPCOMING APPOINTMENT IN ORDER TO RECEIVE ADDITIONAL REFILLS) 90 tablet 3  . sodium chloride (OCEAN) 0.65 % SOLN nasal spray Place 1 spray into both nostrils daily as needed for congestion.     . Testosterone 20.25 MG/1.25GM (1.62%) GEL Apply 2 Squirts topically daily as needed (occasionally). One pump per shoulder.     No current facility-administered  medications for this visit.      Allergies:   Procardia [nifedipine]   Social History   Social History  . Marital status: Divorced    Spouse name: N/A  . Number of children: 2  . Years of education: Masters   Occupational History  .      retired   Social History Main Topics  . Smoking status: Former Smoker    Packs/day: 2.00    Years: 33.00    Types: Cigarettes    Quit date: 06/17/1989  . Smokeless tobacco: Never Used  . Alcohol use 8.4 oz/week    14 Shots of liquor per week  Comment: 10/06/2016 "bourbon"  . Drug use: No  . Sexual activity: Not Currently   Other Topics Concern  . None   Social History Narrative   Patient is single and lives alone.   Patient is retired.   Patient has a Scientist, water quality.   Patient has two adult children.   Patient is right-handed.   Patient drinks one cup of coffee daily.      Pt lives in [place] in his RV alone.   Retired Engineer, civil (consulting)     Family History:  The patient's family history includes Arthritis in his sister; CAD in his father and mother; Healthy in his sister; Pancreatic cancer in his father.   ROS:   Please see the history of present illness.  All other systems are reviewed and otherwise negative.    PHYSICAL EXAM:   VS:  BP 140/80 (BP Location: Left Arm, Patient Position: Sitting, Cuff Size: Normal)   Pulse 75   Ht 5\' 10"  (1.778 m)   Wt 216 lb 12 oz (98.3 kg)   BMI 31.10 kg/m   BMI: Body mass index is 31.1 kg/m. GEN: Well nourished, well developed WM, in no acute distress  HEENT: normocephalic, atraumatic Neck: no JVD, carotid bruits, or masses Cardiac: RRR; soft SEM RUSB, no rubs or gallops, no edema  Respiratory:  clear to auscultation bilaterally, normal work of breathing GI: soft, nontender, nondistended, + BS MS: no deformity or atrophy  Skin: warm and dry, no rash Neuro:  Alert and Oriented x 3, Strength and sensation are intact, follows commands Psych: euthymic mood, full affect  Wt  Readings from Last 3 Encounters:  10/23/16 216 lb 12 oz (98.3 kg)  10/06/16 210 lb 4.8 oz (95.4 kg)  09/17/16 203 lb (92.1 kg)      Studies/Labs Reviewed:   EKG:  EKG was ordered today and personally reviewed by me and demonstrates NSR 75bpm occ PVCs, nonspecific ST sagging inferiorly and V5-V6, prior inferior infarct. No change from recent tracing. Rhythm strip shows occ PVCs, couplets  Recent Labs: 01/09/2016: TSH 1.501 10/06/2016: ALT 23; BUN 23; Creatinine, Ser 1.09; Potassium 3.9; Sodium 140 10/08/2016: Hemoglobin 12.3; Platelets 106   Lipid Panel    Component Value Date/Time   CHOL 191 01/09/2016 0001   TRIG 251 (H) 01/09/2016 0001   HDL 56 01/09/2016 0001   CHOLHDL 3.4 01/09/2016 0001   VLDL 50 (H) 01/09/2016 0001   LDLCALC 85 01/09/2016 0001    Additional studies/ records that were reviewed today include: Summarized above.    ASSESSMENT & PLAN:   1. Irregular HR likely due to PVCs - longstanding for patient, minimally symptomatic. No longer on BB due to bradycardia in the hospital. Would continue conservative management for now. I reviewed that there are some antiarrhythmic medications that can be used for PVCs if they do increase in frequency/symptoms but this may require consideration of pacemaker implantation to protect against worsening bradycardia. For now, he is not bothered by these so will continue to monitor. 2. Paroxysmal atrial fib - maintaining NSR. Continue Eliquis. He does have a history of falls, but denies any recently. This will need to be followed closely. 3. Bradycardia - as above. 4. CAD - stable. Not on ASA due to concomitant Eliquis. Plan for stress test as above for intermittent atypical CP. The patient and his daughter have pushed this out to December based on their own schedule. 5. Chronic combined CHF - appears euvolemic. No longer on BB due to bradycardia.  Disposition: F/u in December for stress test and f/u as scheduled. Anticipate  scheduling with Dr. Tamala Julian for 3-4 months after that appt if everything looks OK. Will also send local refill for Protonix as requested.   Medication Adjustments/Labs and Tests Ordered: Current medicines are reviewed at length with the patient today.  Concerns regarding medicines are outlined above. Medication changes, Labs and Tests ordered today are summarized above and listed in the Patient Instructions accessible in Encounters.   Raechel Ache PA-C  10/23/2016 9:17 AM    [address], [place], [place]  [address] Phone: [phone]; Fax: [phone]

## 2016-10-22 NOTE — Telephone Encounter (Signed)
Pt daughter  dropped off form to be filled out for [place], please fax when ready and call when ready so she can come pick it up  Pt can be reached at [phone] put in your folder

## 2016-10-22 NOTE — Telephone Encounter (Signed)
New Message;    Aaron Berg says pt heart rate resting is 48 and irregular. When he walks it is about 56 and more irregular.

## 2016-10-22 NOTE — Telephone Encounter (Signed)
I spoke with Marijean Bravo, HHN. Juliann Pulse states that she is at pt's home now. Juliann Pulse reports pt HR 48 irreg at rest and 53 irreg after exercise, BP 130/80 R, 150/90 L. Juliann Pulse reports pt's heart rate last week when she checked it was 72 and regular. Juliann Pulse states pt reports taking Eliquis and has not missed any doses.  Juliann Pulse states pt is asymptomatic except experiencing some chest discomfort, like indigestion, pt vague about his symptoms. Juliann Pulse states pt reports this discomfort is different than chest discomfort at time of recent admission to the hospital, pt has not tried NTG for relief of chest discomfort,  pt asymptomatic at this time. Juliann Pulse is requesting appt for pt in our office.  Juliann Pulse advised pt has been scheduled to see Melina Copa, PA 10/23/16 at 9 AM. Juliann Pulse advised to recommend pt try NTG every 5 minutes for relief of chest discomfort, call 911 if chest discomfort not relieved after 3 NTG.

## 2016-10-23 ENCOUNTER — Telehealth: Payer: Self-pay

## 2016-10-23 ENCOUNTER — Ambulatory Visit (INDEPENDENT_AMBULATORY_CARE_PROVIDER_SITE_OTHER): Payer: Medicare Other | Admitting: Physician Assistant

## 2016-10-23 ENCOUNTER — Other Ambulatory Visit: Payer: Self-pay | Admitting: Neurology

## 2016-10-23 ENCOUNTER — Encounter: Payer: Self-pay | Admitting: Physician Assistant

## 2016-10-23 ENCOUNTER — Other Ambulatory Visit: Payer: Self-pay | Admitting: Physician Assistant

## 2016-10-23 VITALS — BP 140/80 | HR 75 | Ht 70.0 in | Wt 216.8 lb

## 2016-10-23 DIAGNOSIS — I480 Paroxysmal atrial fibrillation: Secondary | ICD-10-CM | POA: Diagnosis not present

## 2016-10-23 DIAGNOSIS — R251 Tremor, unspecified: Secondary | ICD-10-CM | POA: Diagnosis not present

## 2016-10-23 DIAGNOSIS — I5042 Chronic combined systolic (congestive) and diastolic (congestive) heart failure: Secondary | ICD-10-CM

## 2016-10-23 DIAGNOSIS — I493 Ventricular premature depolarization: Secondary | ICD-10-CM

## 2016-10-23 DIAGNOSIS — F0390 Unspecified dementia without behavioral disturbance: Secondary | ICD-10-CM | POA: Diagnosis not present

## 2016-10-23 DIAGNOSIS — I2510 Atherosclerotic heart disease of native coronary artery without angina pectoris: Secondary | ICD-10-CM | POA: Diagnosis not present

## 2016-10-23 DIAGNOSIS — R001 Bradycardia, unspecified: Secondary | ICD-10-CM | POA: Diagnosis not present

## 2016-10-23 MED ORDER — PANTOPRAZOLE SODIUM 40 MG PO TBEC: 40.0000 mg | DELAYED_RELEASE_TABLET | Freq: Every day | ORAL | 1 refills | Status: DC

## 2016-10-23 NOTE — Telephone Encounter (Signed)
Made copy for [place] and faxed over for pts daughter and called her to let her know it was ready to be picked up

## 2016-10-23 NOTE — Patient Instructions (Addendum)
Medication Instructions:  Your physician recommends that you continue on your current medications as directed. Please refer to the Current Medication list given to you today.  We sent in your refill for Protonix to [facility]  Labwork: None ordered  Testing/Procedures: None ordered  Follow-Up: Your physician recommends that you schedule a follow-up appointment in: [place]   Any Other Special Instructions Will Be Listed Below (If Applicable).   If you need a refill on your cardiac medications before your next appointment, please call your pharmacy.

## 2016-10-23 NOTE — Telephone Encounter (Signed)
Okay to change to a quantity of ninety?

## 2016-10-23 NOTE — Telephone Encounter (Signed)
Wendy speech therapist said Aaron Berg reported to her he fell twice today his legs gave out on him he has no injuries . She told him again no getting up or moving around with out walker just wanted to inform you

## 2016-10-28 ENCOUNTER — Telehealth: Payer: Self-pay | Admitting: Family Medicine

## 2016-10-28 DIAGNOSIS — R251 Tremor, unspecified: Secondary | ICD-10-CM | POA: Diagnosis not present

## 2016-10-28 DIAGNOSIS — F0390 Unspecified dementia without behavioral disturbance: Secondary | ICD-10-CM | POA: Diagnosis not present

## 2016-10-28 NOTE — Telephone Encounter (Signed)
Pts daughter dropped off FMLA papers to be filled out, can call her at [phone] when ready to be picked up, states she needs them in case she needs to take him to the dr, needs intermediate FMLA put in your folder

## 2016-10-28 NOTE — Telephone Encounter (Signed)
Per Dr Redmond School called Daughter to make appt to come in to See if it is something as simple and needing his ears cleaned out, called and left a message on her voice mail to give [place] a call back

## 2016-10-28 NOTE — Telephone Encounter (Signed)
Pts daughter called and would like a referral and [place]  sent to the audiologist Dr Vicie Mutters, she states pt can not hear, pt daughter Sharyn Lull would like to be called when the appt is made since she brings him to the appts pt daughter can be reached at [phone]

## 2016-10-29 DIAGNOSIS — F0390 Unspecified dementia without behavioral disturbance: Secondary | ICD-10-CM | POA: Diagnosis not present

## 2016-10-29 DIAGNOSIS — R251 Tremor, unspecified: Secondary | ICD-10-CM | POA: Diagnosis not present

## 2016-10-29 NOTE — Telephone Encounter (Signed)
LM for Aaron Berg that pt needs to be seen before FMLA forms can be filled out. [name]

## 2016-10-30 DIAGNOSIS — F0390 Unspecified dementia without behavioral disturbance: Secondary | ICD-10-CM | POA: Diagnosis not present

## 2016-10-30 DIAGNOSIS — R251 Tremor, unspecified: Secondary | ICD-10-CM | POA: Diagnosis not present

## 2016-11-03 ENCOUNTER — Encounter: Payer: Self-pay | Admitting: Family Medicine

## 2016-11-03 ENCOUNTER — Ambulatory Visit (INDEPENDENT_AMBULATORY_CARE_PROVIDER_SITE_OTHER): Payer: Medicare Other | Admitting: Family Medicine

## 2016-11-03 VITALS — BP 136/80 | Wt 215.0 lb

## 2016-11-03 DIAGNOSIS — G4733 Obstructive sleep apnea (adult) (pediatric): Secondary | ICD-10-CM

## 2016-11-03 DIAGNOSIS — G250 Essential tremor: Secondary | ICD-10-CM

## 2016-11-03 DIAGNOSIS — I2510 Atherosclerotic heart disease of native coronary artery without angina pectoris: Secondary | ICD-10-CM | POA: Diagnosis not present

## 2016-11-03 DIAGNOSIS — H9113 Presbycusis, bilateral: Secondary | ICD-10-CM | POA: Diagnosis not present

## 2016-11-03 DIAGNOSIS — R296 Repeated falls: Secondary | ICD-10-CM | POA: Diagnosis not present

## 2016-11-03 DIAGNOSIS — R251 Tremor, unspecified: Secondary | ICD-10-CM | POA: Diagnosis not present

## 2016-11-03 DIAGNOSIS — F0390 Unspecified dementia without behavioral disturbance: Secondary | ICD-10-CM | POA: Diagnosis not present

## 2016-11-03 NOTE — Progress Notes (Signed)
   Subjective:    Patient ID: Aaron Berg, male    DOB: August 31, 1936, 80 y.o.   MRN: [id number]  HPI He is here for an interval visit. He is having difficulty with decreased hearing and would like to be referred for possible hearing aids. His daughter is also with him. She needs an FMLA form filled out. He continues to be followed by [facility] but apparently has missed some appointments. He has had a couple falls but is using a walker at home. He continues to be followed by neurology for his underlying tremor as well as memory issues. He does have OSA and is using his CPAP.   Review of Systems     Objective:   Physical Exam Alert and in no distress. TMs and canals normal       Assessment & Plan:  Multiple falls  Tremor, essential  OSA (obstructive sleep apnea)  Presbycusis of both ears - Plan: Ambulatory referral to Audiology Discuss the falls with him and encouraged him to continue with physical therapy and building his strength is much as possible. He is also to use his walker as much as possible. He will continue to be followed by neurology for his tremor. He seems be doing fairly well with his OSA and CPAP. The FMLA form was filled out. She will need to be monitoring his overall care and she is now his POA. Over 25 minutes, greater than 50% spent in counseling and coordination of care.

## 2016-11-04 DIAGNOSIS — F0390 Unspecified dementia without behavioral disturbance: Secondary | ICD-10-CM | POA: Diagnosis not present

## 2016-11-04 DIAGNOSIS — R251 Tremor, unspecified: Secondary | ICD-10-CM | POA: Diagnosis not present

## 2016-11-05 DIAGNOSIS — F0390 Unspecified dementia without behavioral disturbance: Secondary | ICD-10-CM | POA: Diagnosis not present

## 2016-11-05 DIAGNOSIS — R251 Tremor, unspecified: Secondary | ICD-10-CM | POA: Diagnosis not present

## 2016-11-10 ENCOUNTER — Telehealth: Payer: Self-pay | Admitting: Family Medicine

## 2016-11-10 DIAGNOSIS — F0390 Unspecified dementia without behavioral disturbance: Secondary | ICD-10-CM | POA: Diagnosis not present

## 2016-11-10 DIAGNOSIS — R251 Tremor, unspecified: Secondary | ICD-10-CM | POA: Diagnosis not present

## 2016-11-10 MED ORDER — FLUOXETINE HCL 10 MG PO CAPS: 10.0000 mg | ORAL_CAPSULE | Freq: Every day | ORAL | 0 refills | Status: DC

## 2016-11-10 NOTE — Telephone Encounter (Signed)
Requesting refill on Prozac since getting last refill now from [facility] and it takes a long time for [facility] to get refills authorizations

## 2016-11-12 ENCOUNTER — Telehealth (HOSPITAL_COMMUNITY): Payer: Self-pay | Admitting: *Deleted

## 2016-11-12 NOTE — Telephone Encounter (Signed)
Left message on voicemail per DPR in reference to upcoming appointment scheduled on 11/17/16 with detailed instructions given per Myocardial Perfusion Study Information Sheet for the test. LM to arrive 15 minutes early, and that it is imperative to arrive on time for appointment to keep from having the test rescheduled. If you need to cancel or reschedule your appointment, please call the office within 24 hours of your appointment. Failure to do so may result in a cancellation of your appointment, and a $50 no show fee. Phone number given for call back for any questions. Patient instructed to come in at 11 am.  Kirstie Peri

## 2016-11-17 ENCOUNTER — Ambulatory Visit (HOSPITAL_COMMUNITY): Payer: Medicare Other | Attending: Cardiology

## 2016-11-17 DIAGNOSIS — R079 Chest pain, unspecified: Secondary | ICD-10-CM | POA: Insufficient documentation

## 2016-11-17 DIAGNOSIS — I10 Essential (primary) hypertension: Secondary | ICD-10-CM | POA: Diagnosis not present

## 2016-11-17 DIAGNOSIS — E785 Hyperlipidemia, unspecified: Secondary | ICD-10-CM | POA: Insufficient documentation

## 2016-11-17 MED ORDER — TECHNETIUM TC 99M TETROFOSMIN IV KIT
32.1000 | PACK | Freq: Once | INTRAVENOUS | Status: AC | PRN
  Administered 2016-11-17: 32.1 via INTRAVENOUS
  Filled 2016-11-17: qty 33

## 2016-11-18 ENCOUNTER — Ambulatory Visit (HOSPITAL_COMMUNITY): Payer: Medicare Other | Attending: Cardiology

## 2016-11-18 DIAGNOSIS — E785 Hyperlipidemia, unspecified: Secondary | ICD-10-CM | POA: Diagnosis not present

## 2016-11-18 DIAGNOSIS — H35372 Puckering of macula, left eye: Secondary | ICD-10-CM | POA: Diagnosis not present

## 2016-11-18 DIAGNOSIS — H43392 Other vitreous opacities, left eye: Secondary | ICD-10-CM | POA: Diagnosis not present

## 2016-11-18 DIAGNOSIS — I10 Essential (primary) hypertension: Secondary | ICD-10-CM | POA: Diagnosis not present

## 2016-11-18 DIAGNOSIS — R079 Chest pain, unspecified: Secondary | ICD-10-CM | POA: Diagnosis not present

## 2016-11-18 DIAGNOSIS — H35341 Macular cyst, hole, or pseudohole, right eye: Secondary | ICD-10-CM | POA: Diagnosis not present

## 2016-11-18 DIAGNOSIS — H43812 Vitreous degeneration, left eye: Secondary | ICD-10-CM | POA: Diagnosis not present

## 2016-11-18 LAB — MYOCARDIAL PERFUSION IMAGING
CHL CUP RESTING HR STRESS: 71 {beats}/min
LHR: 0.35
LVDIAVOL: 196 mL (ref 62–150)
LVSYSVOL: 116 mL
NUC STRESS TID: 0.93
Peak HR: 85 {beats}/min
SDS: 4
SRS: 5
SSS: 8

## 2016-11-18 MED ORDER — TECHNETIUM TC 99M TETROFOSMIN IV KIT
31.8000 | PACK | Freq: Once | INTRAVENOUS | Status: AC | PRN
  Administered 2016-11-18: 31.8 via INTRAVENOUS
  Filled 2016-11-18: qty 32

## 2016-11-18 MED ORDER — REGADENOSON 0.4 MG/5ML IV SOLN
0.4000 mg | Freq: Once | INTRAVENOUS | Status: AC
  Administered 2016-11-18: 0.4 mg via INTRAVENOUS

## 2016-11-19 DIAGNOSIS — F0390 Unspecified dementia without behavioral disturbance: Secondary | ICD-10-CM | POA: Diagnosis not present

## 2016-11-19 DIAGNOSIS — R251 Tremor, unspecified: Secondary | ICD-10-CM | POA: Diagnosis not present

## 2016-11-19 NOTE — Progress Notes (Signed)
Pt of Dr. Tamala Julian. Stress test was 2 days ago. I will route to DR. Smith.   Aaron Berg

## 2016-11-21 ENCOUNTER — Ambulatory Visit: Payer: Medicare Other | Admitting: Cardiology

## 2016-11-21 ENCOUNTER — Other Ambulatory Visit: Payer: Self-pay | Admitting: Family Medicine

## 2016-11-21 NOTE — Telephone Encounter (Signed)
Is this okay to refill? 

## 2016-11-24 ENCOUNTER — Ambulatory Visit: Payer: Medicare Other | Admitting: Adult Health

## 2016-11-27 ENCOUNTER — Ambulatory Visit: Payer: Medicare Other | Admitting: Adult Health

## 2016-11-28 ENCOUNTER — Encounter: Payer: Self-pay | Admitting: *Deleted

## 2016-11-28 ENCOUNTER — Encounter: Payer: Self-pay | Admitting: Cardiology

## 2016-11-28 ENCOUNTER — Ambulatory Visit (INDEPENDENT_AMBULATORY_CARE_PROVIDER_SITE_OTHER): Payer: Medicare Other | Admitting: Cardiology

## 2016-11-28 VITALS — BP 132/72 | HR 72 | Ht 70.0 in | Wt 216.0 lb

## 2016-11-28 DIAGNOSIS — I2510 Atherosclerotic heart disease of native coronary artery without angina pectoris: Secondary | ICD-10-CM | POA: Diagnosis not present

## 2016-11-28 DIAGNOSIS — J40 Bronchitis, not specified as acute or chronic: Secondary | ICD-10-CM

## 2016-11-28 DIAGNOSIS — I480 Paroxysmal atrial fibrillation: Secondary | ICD-10-CM | POA: Diagnosis not present

## 2016-11-28 DIAGNOSIS — R931 Abnormal findings on diagnostic imaging of heart and coronary circulation: Secondary | ICD-10-CM

## 2016-11-28 DIAGNOSIS — R9439 Abnormal result of other cardiovascular function study: Secondary | ICD-10-CM | POA: Diagnosis not present

## 2016-11-28 DIAGNOSIS — R001 Bradycardia, unspecified: Secondary | ICD-10-CM

## 2016-11-28 DIAGNOSIS — I493 Ventricular premature depolarization: Secondary | ICD-10-CM

## 2016-11-28 LAB — BASIC METABOLIC PANEL
BUN: 26 mg/dL — AB (ref 7–25)
CHLORIDE: 106 mmol/L (ref 98–110)
CO2: 23 mmol/L (ref 20–31)
CREATININE: 1.2 mg/dL — AB (ref 0.70–1.18)
Calcium: 9.8 mg/dL (ref 8.6–10.3)
GLUCOSE: 72 mg/dL (ref 65–99)
Potassium: 4.8 mmol/L (ref 3.5–5.3)
Sodium: 140 mmol/L (ref 135–146)

## 2016-11-28 NOTE — Patient Instructions (Signed)
Medication Instructions:  Your physician recommends that you continue on your current medications as directed. Please refer to the Current Medication list given to you today.   Labwork: TODAY:  BMET, CBC, & PT/INR  Testing/Procedures: Your physician has requested that you have a cardiac catheterization. Cardiac catheterization is used to diagnose and/or treat various heart conditions. Doctors may recommend this procedure for a number of different reasons. The most common reason is to evaluate chest pain. Chest pain can be a symptom of coronary artery disease (CAD), and cardiac catheterization can show whether plaque is narrowing or blocking your heart's arteries. This procedure is also used to evaluate the valves, as well as measure the blood flow and oxygen levels in different parts of your heart. For further information please visit [email or web address][facility][email or web address]. Please follow instruction sheet, as given.    Follow-Up: Your physician recommends that you schedule a follow-up appointment in: [place]   Any Other Special Instructions Will Be Listed Below (If Applicable).   Coronary Angiogram With Stent Coronary angiogram with stent placement is a procedure to widen or open a narrow blood vessel of the heart (coronary artery). Arteries may become blocked by cholesterol buildup (plaques) in the lining or wall. When a coronary artery becomes partially blocked, blood flow to that area decreases. This may lead to chest pain or a heart attack (myocardial infarction). A stent is a small piece of metal that looks like mesh or a spring. Stent placement may be done as treatment for a heart attack or right after a coronary angiogram in which a blocked artery is found. Let your health care provider know about:  Any allergies you have.  All medicines you are taking, including vitamins, herbs, eye drops, creams, and over-the-counter medicines.  Any problems you or family members have had  with anesthetic medicines.  Any blood disorders you have.  Any surgeries you have had.  Any medical conditions you have.  Whether you are pregnant or may be pregnant. What are the risks? Generally, this is a safe procedure. However, problems may occur, including:  Damage to the heart or its blood vessels.  A return of blockage.  Bleeding, infection, or bruising at the insertion site.  A collection of blood under the skin (hematoma) at the insertion site.  A blood clot in another part of the body.  Kidney injury.  Allergic reaction to the dye or contrast that is used.  Bleeding into the abdomen (retroperitoneal bleeding). What happens before the procedure? Staying hydrated  Follow instructions from your health care provider about hydration, which may include:  Up to 2 hours before the procedure - you may continue to drink clear liquids, such as water, clear fruit juice, black coffee, and plain tea. Eating and drinking restrictions  Follow instructions from your health care provider about eating and drinking, which may include:  8 hours before the procedure - stop eating heavy meals or foods such as meat, fried foods, or fatty foods.  6 hours before the procedure - stop eating light meals or foods, such as toast or cereal.  2 hours before the procedure - stop drinking clear liquids. Ask your health care provider about:  Changing or stopping your regular medicines. This is especially important if you are taking diabetes medicines or blood thinners.  Taking medicines such as ibuprofen. These medicines can thin your blood. Do not take these medicines before your procedure if your health care provider instructs you not to. Generally, aspirin is recommended  before a procedure of passing a small, thin tube (catheter) through a blood vessel and into the heart (cardiac catheterization). What happens during the procedure?  An IV tube will be inserted into one of your veins.  You  will be given one or more of the following:  A medicine to help you relax (sedative).  A medicine to numb the area where the catheter will be inserted into an artery (local anesthetic).  To reduce your risk of infection:  Your health care team will wash or sanitize their hands.  Your skin will be washed with soap.  Hair may be removed from the area where the catheter will be inserted.  Using a guide wire, the catheter will be inserted into an artery. The location may be in your groin, in your wrist, or in the fold of your arm (near your elbow).  A type of X-ray (fluoroscopy) will be used to help guide the catheter to the opening of the arteries in the heart.  A dye will be injected into the catheter, and X-rays will be taken. The dye will help to show where any narrowing or blockages are located in the arteries.  A tiny wire will be guided to the blocked spot, and a balloon will be inflated to make the artery wider.  The stent will be expanded and will crush the plaques into the wall of the vessel. The stent will hold the area open and improve the blood flow. Most stents have a drug coating to reduce the risk of the stent narrowing over time.  The artery may be made wider using a drill, laser, or other tools to remove plaques.  When the blood flow is better, the catheter will be removed. The lining of the artery will grow over the stent, which stays where it was placed. This procedure may vary among health care providers and hospitals. What happens after the procedure?  If the procedure is done through the leg, you will be kept in bed lying flat for about 6 hours. You will be instructed to not bend and not cross your legs.  The insertion site will be checked frequently.  The pulse in your foot or wrist will be checked frequently.  You may have additional blood tests, X-rays, and a test that records the electrical activity of your heart (electrocardiogram, or ECG). This  information is not intended to replace advice given to you by your health care provider. Make sure you discuss any questions you have with your health care provider. Document Released: 06/07/2003 Document Revised: 07/31/2016 Document Reviewed: 07/06/2016 Elsevier Interactive Patient Education  2017 [employer].    If you need a refill on your cardiac medications before your next appointment, please call your pharmacy.

## 2016-11-28 NOTE — Progress Notes (Signed)
Cardiology Office Note   Date:  11/28/2016   ID:  ZEPHEN MARINEZ, DOB 06-06-1936, MRN [id number]  PCP:  Wyatt Haste, MD  Cardiologist:  Dr. Tamala Julian    Chief Complaint  Patient presents with  . Atrial Fibrillation      History of Present Illness: NYEEM CORNETT is a 80 y.o. male who presents for abnormal stress test.   He has a history of CAD s/p CABG 2003, last cath 2012 with patent LIMA t LAD, patent VG to RCA and 50% ostial stenosis of SVG to LCX.  EF at that time 35%.  He has chronic combined CHF, mild aortic stenosis/mitral regurg by echo 09/2016, HTN, HLD, paroxysmal atrial fib on Eliquis, OSA on CPAP, COPD, microvascular dementia, CVA, falls who presents for evaluation of irregular HR noted by [facility]. Per phone notes the patient was noted to have HR in the upper 40s and low 50s with irregularly felt. Recent adm 09/2016 for chest pain, atypical, ruled out for MI. During that say he was noted to maintain NSR with PVCs with HR 40s-50s, the bradycardia prompting reduction in beta blocker therapy and ultimate discontinuation given HR 41. 2D echo 10/08/16: EF 40-45%, grade 2 DD, mild aortic stenosis, mild mitral regurgitation. Outpatient nuc was recommended. Labs notable for Hgb 12.3, Cr 1.09, K 3.9.  Nuc was ordered on last visit with EF 41%, (30-44%)  Was listed as high risk and reviewed by Dr. Tamala Julian and he would like pt to have cardiac cath and need to be off Eliquis for 48 hours prior to procedure.  No ischemia. EF in 2015 was  45%.  Dr. Tamala Julian has reviewed and recommends cardiac cath.  It has been 5 years since the last one.    Today pt has no complaints mild cough and rhonchi.  No chest pain but he has never had chest pain.  His home BP monitor notes HR in 30s, most likely with PVCs.  No tachycardia. His BB was stopped for slow HR in the hospital.  Pt is agreeable to cath as well as daughter.      Past Medical History:  Diagnosis Date  . Aortic stenosis   . Arthritis    "thumbs, wrists, legs" (10/06/2016)  . Atrial flutter ([place])   . CAD (coronary artery disease)    a. s/p CABG 2003.  [name] Cerebrovascular disease 2006   multiple ischemic changes on prior MRI  . Chronic combined systolic and diastolic CHF (congestive heart failure) ([place])   . Chronic lower back pain   . Complication of anesthesia    "woke up too soon"  . COPD (chronic obstructive pulmonary disease) ([place])    "no signs or tests"  . CVA (cerebral vascular accident) ([place])   . Dementia   . Depression   . Dry eyes   . Dyslipidemia    well controlled  . Gait disorder   . Heart murmur    "found in ~ 1946"  . Hypertension   . Hypertensive cardiovascular disease   . Male hypogonadism   . Migraine    "major one when I was a kid; very painful"  . Mitral regurgitation   . Multiple falls    "after both knees surgeries; went to rehab; came out and fell all the time" (10/06/2016)  . Obesity   . OSA on CPAP    cpap  . Paroxysmal atrial fibrillation (HCC)   . Skin cancer of anterior chest   . Spinal stenosis  Past Surgical History:  Procedure Laterality Date  . APPENDECTOMY  as child  . CARDIAC CATHETERIZATION  11/2003   Archie Endo 04/29/2011  . CARDIOVERSION N/A 04/26/2014   Procedure: CARDIOVERSION;  Surgeon: Sinclair Grooms, MD;  Location: [facility] ENDOSCOPY;  Service: Cardiovascular;  Laterality: N/A;  . CARDIOVERSION N/A 05/24/2014   Procedure: CARDIOVERSION;  Surgeon: Sinclair Grooms, MD;  Location: [place];  Service: Cardiovascular;  Laterality: N/A;  . CATARACT EXTRACTION W/ INTRAOCULAR LENS  IMPLANT, BILATERAL Bilateral 2000s  . CORONARY ANGIOPLASTY  1991   "no stents" (10/06/2016)  . CORONARY ARTERY BYPASS GRAFT  11/2002   X 3/notes 04/29/2011  . CYST EXCISION     lumbar; Dr. Joya Salm  . EYE SURGERY Right    detached retina  . JOINT REPLACEMENT    . MOHS SURGERY     on chest  . RETINAL DETACHMENT SURGERY Right   . SHOULDER SURGERY Right    Right with murphy, reattach  ligaments  . TONSILLECTOMY  as child  . TOTAL KNEE ARTHROPLASTY Right 01/02/2014   Procedure: RIGHT TOTAL KNEE ARTHROPLASTY;  Surgeon: Mauri Pole, MD;  Location: [facility] ORS;  Service: Orthopedics;  Laterality: Right;  . TOTAL KNEE ARTHROPLASTY Left 01/31/2014   Procedure: LEFT TOTAL KNEE ARTHROPLASTY;  Surgeon: Mauri Pole, MD;  Location: [facility] ORS;  Service: Orthopedics;  Laterality: Left;  . TOTAL SHOULDER ARTHROPLASTY Right   . VIDEO ASSISTED THORACOSCOPY (VATS)/THOROCOTOMY  2003   benign nodule     Current Outpatient Prescriptions  Medication Sig Dispense Refill  . apixaban (ELIQUIS) 5 MG TABS tablet Take 1 tablet (5 mg total) by mouth 2 (two) times daily. 180 tablet 3  . buPROPion (WELLBUTRIN SR) 150 MG 12 hr tablet TAKE 1 TABLET TWICE A DAY 180 tablet 0  . cholecalciferol (VITAMIN D) 1000 UNITS tablet Take 1,000 Units by mouth daily.     [name] donepezil (ARICEPT) 23 MG TABS tablet TAKE 1 TABLET AT BEDTIME 90 tablet 1  . feeding supplement, ENSURE ENLIVE, (ENSURE ENLIVE) LIQD Take 237 mLs by mouth 2 (two) times daily between meals. 237 mL 12  . fexofenadine (ALLEGRA) 180 MG tablet Take 180 mg by mouth daily.    [name] FLUoxetine (PROZAC) 10 MG capsule Take 1 capsule (10 mg total) by mouth daily. 90 capsule 0  . folic acid (FOLVITE) 1 MG tablet Take 1 tablet (1 mg total) by mouth daily. 30 tablet 1  . Multiple Vitamins-Minerals (MULTIVITAMIN WITH MINERALS) tablet Take 1 tablet by mouth daily. Reported on 01/30/2016    . nitroGLYCERIN (NITROSTAT) 0.4 MG SL tablet Place 1 tablet (0.4 mg total) under the tongue every 5 (five) minutes as needed for chest pain. 25 tablet 12  . pantoprazole (PROTONIX) 40 MG tablet TAKE 1 TABLET(40 MG) BY MOUTH DAILY 90 tablet 1  . rosuvastatin (CRESTOR) 40 MG tablet TAKE 1 TABLET DAILY WITH BREAKFAST FOR HYPERLIPIDEMIA (KEEP UPCOMING APPOINTMENT IN ORDER TO RECEIVE ADDITIONAL REFILLS) 90 tablet 3  . sodium chloride (OCEAN) 0.65 % SOLN nasal spray Place 1 spray into both  nostrils daily as needed for congestion.     . Testosterone 20.25 MG/1.25GM (1.62%) GEL Apply 2 Squirts topically daily as needed (occasionally). One pump per shoulder.     No current facility-administered medications for this visit.     Allergies:   Procardia [nifedipine]    Social History:  The patient  reports that he quit smoking about 27 years ago. His smoking use included Cigarettes. He has a  66.00 pack-year smoking history. He has never used smokeless tobacco. He reports that he drinks about 8.4 oz of alcohol per week . He reports that he does not use drugs.   Family History:  The patient's family history includes Arthritis in his sister; CAD in his father and mother; Healthy in his sister; Pancreatic cancer in his father.    ROS:  General:no colds or fevers, no weight changes- wt is down  Skin:no rashes or ulcers HEENT:no blurred vision, no congestion CV:see HPI PUL:see HPI GI:no diarrhea constipation or melena, no indigestion GU:no hematuria, no dysuria MS:no joint pain, no claudication Neuro:no syncope, no lightheadedness Endo:no diabetes, no thyroid disease  Wt Readings from Last 3 Encounters:  11/28/16 216 lb (98 kg)  11/17/16 203 lb (92.1 kg)  11/03/16 215 lb (97.5 kg)     PHYSICAL EXAM: VS:  BP 132/72   Pulse 72   Ht 5\' 10"  (1.778 m)   Wt 216 lb (98 kg)   BMI 30.99 kg/m  , BMI Body mass index is 30.99 kg/m. General:Pleasant affect, NAD Skin:Warm and dry, brisk capillary refill HEENT:normocephalic, sclera clear, mucus membranes moist Neck:supple, no JVD, no bruits  Heart:S1S2 RRR without murmur, gallup, rub or click Lungs:clear without rales, + rhonchi, no current wheezes but he does at night wheezes [id number], non tender, + BS, do not palpate liver spleen or masses Ext:tr to 1+ lower ext edema, 2+ pedal pulses, 2+ radial pulses Neuro:alert and oriented X 3, MAE, follows commands, + facial symmetry    EKG:  EKG is NOT ordered today.    Recent  Labs: 01/09/2016: TSH 1.501 10/06/2016: ALT 23; BUN 23; Creatinine, Ser 1.09; Potassium 3.9; Sodium 140 10/08/2016: Hemoglobin 12.3; Platelets 106    Lipid Panel    Component Value Date/Time   CHOL 191 01/09/2016 0001   TRIG 251 (H) 01/09/2016 0001   HDL 56 01/09/2016 0001   CHOLHDL 3.4 01/09/2016 0001   VLDL 50 (H) 01/09/2016 0001   LDLCALC 85 01/09/2016 0001       Other studies Reviewed: Additional studies/ records that were reviewed today include: . Nuc study:       Nuclear stress EF: 41%.  There was no ST segment deviation noted during stress.  There is a medium defect of moderate severity present in the basal inferior, mid inferior, apical lateral and apex location. The defect is non-reversible and consistent with prior scar of diaphragmatic attenuation artifact.  This is a high risk study.  The left ventricular ejection fraction is moderately decreased (30-44%).     ECHO: Study Conclusions  - Left ventricle: The cavity size was moderately dilated. Systolic   function was mildly to moderately reduced. The estimated ejection   fraction was in the range of 40% to 45%. Diffuse hypokinesis.   Features are consistent with a pseudonormal left ventricular   filling pattern, with concomitant abnormal relaxation and   increased filling pressure (grade 2 diastolic dysfunction). - Aortic valve: Moderately calcified annulus. Trileaflet;   moderately thickened, moderately calcified leaflets. There was   mild stenosis. Valve area (VTI): 1.14 cm^2. Valve area (Vmax):   1.18 cm^2. Valve area (Vmean): 1.08 cm^2. - Mitral valve: There was mild regurgitation.   ASSESSMENT AND PLAN:  1. Abnormal nuc study after admit for chest pain though neg. MI.  Dr. Tamala Julian has reviewed nuc and will proceed with cath with Dr. Tamala Julian next week.   Pt and daughter agree.  The patient understands that risks included but are not limited  to stroke (1 in 1000), death (1 in 70), kidney failure  [usually temporary] (1 in 500), bleeding (1 in 200), allergic reaction [possibly serious] (1 in 200).   We will have him stop Eliquis after dose Sunday for cath on Wed.  Did not add asa yet.    2.  CAD with hx CABG in 2004.  Last cath 2012 with some VG disease.   3. PAF currently in SR.    4. Bradycardia now off BB since prior to discharge 10/08/16. .   5. Chronic combined CHF euvolemic.   6.  Mild bronchitis today.    Current medicines are reviewed with the patient today.  The patient Has no concerns regarding medicines.  The following changes have been made:  See above Labs/ tests ordered today include:see above  Disposition:   FU:  see above  Signed, Cecilie Kicks, NP  11/28/2016 2:31 PM    [facility] [place], [address], [place], [place] Phone: [phone]; Fax: [phone]

## 2016-11-29 LAB — CBC
HCT: 43 % (ref 38.5–50.0)
Hemoglobin: 13.7 g/dL (ref 13.2–17.1)
MCH: 30.8 pg (ref 27.0–33.0)
MCHC: 31.9 g/dL — AB (ref 32.0–36.0)
MCV: 96.6 fL (ref 80.0–100.0)
MPV: 12.3 fL (ref 7.5–12.5)
PLATELETS: 166 10*3/uL (ref 140–400)
RBC: 4.45 MIL/uL (ref 4.20–5.80)
RDW: 14.1 % (ref 11.0–15.0)
WBC: 6.2 10*3/uL (ref 3.8–10.8)

## 2016-11-29 LAB — PROTIME-INR
INR: 1.1
Prothrombin Time: 11.5 s (ref 9.0–11.5)

## 2016-12-01 ENCOUNTER — Telehealth: Payer: Self-pay

## 2016-12-01 NOTE — Telephone Encounter (Signed)
Pt's daughter called to let you know you may get a new request for PT, as pt has moved and she wanted PT to continue at his new address. Daughter can be reached at [phone] if any questions. [name]

## 2016-12-02 DIAGNOSIS — I110 Hypertensive heart disease with heart failure: Secondary | ICD-10-CM | POA: Diagnosis not present

## 2016-12-02 DIAGNOSIS — R269 Unspecified abnormalities of gait and mobility: Secondary | ICD-10-CM | POA: Diagnosis not present

## 2016-12-03 ENCOUNTER — Encounter (HOSPITAL_COMMUNITY)
Admission: RE | Disposition: A | Discharge status: Home / Self Care | Payer: Self-pay | Source: Ambulatory Visit | Attending: Interventional Cardiology

## 2016-12-03 ENCOUNTER — Ambulatory Visit (HOSPITAL_COMMUNITY)
Admission: RE | Admit: 2016-12-03 | Discharge: 2016-12-03 | Disposition: A | Discharge status: Home / Self Care | Payer: Medicare Other | Source: Ambulatory Visit | Attending: Interventional Cardiology | Admitting: Interventional Cardiology

## 2016-12-03 DIAGNOSIS — G250 Essential tremor: Secondary | ICD-10-CM | POA: Diagnosis present

## 2016-12-03 DIAGNOSIS — I5042 Chronic combined systolic (congestive) and diastolic (congestive) heart failure: Secondary | ICD-10-CM | POA: Diagnosis not present

## 2016-12-03 DIAGNOSIS — I679 Cerebrovascular disease, unspecified: Secondary | ICD-10-CM | POA: Insufficient documentation

## 2016-12-03 DIAGNOSIS — E785 Hyperlipidemia, unspecified: Secondary | ICD-10-CM | POA: Diagnosis not present

## 2016-12-03 DIAGNOSIS — E669 Obesity, unspecified: Secondary | ICD-10-CM | POA: Insufficient documentation

## 2016-12-03 DIAGNOSIS — I4892 Unspecified atrial flutter: Secondary | ICD-10-CM | POA: Diagnosis not present

## 2016-12-03 DIAGNOSIS — Z85828 Personal history of other malignant neoplasm of skin: Secondary | ICD-10-CM | POA: Insufficient documentation

## 2016-12-03 DIAGNOSIS — I340 Nonrheumatic mitral (valve) insufficiency: Secondary | ICD-10-CM | POA: Insufficient documentation

## 2016-12-03 DIAGNOSIS — G4733 Obstructive sleep apnea (adult) (pediatric): Secondary | ICD-10-CM | POA: Diagnosis not present

## 2016-12-03 DIAGNOSIS — Z808 Family history of malignant neoplasm of other organs or systems: Secondary | ICD-10-CM | POA: Insufficient documentation

## 2016-12-03 DIAGNOSIS — Z7901 Long term (current) use of anticoagulants: Secondary | ICD-10-CM | POA: Diagnosis not present

## 2016-12-03 DIAGNOSIS — I2582 Chronic total occlusion of coronary artery: Secondary | ICD-10-CM | POA: Insufficient documentation

## 2016-12-03 DIAGNOSIS — Z6830 Body mass index (BMI) 30.0-30.9, adult: Secondary | ICD-10-CM | POA: Diagnosis not present

## 2016-12-03 DIAGNOSIS — I480 Paroxysmal atrial fibrillation: Secondary | ICD-10-CM | POA: Insufficient documentation

## 2016-12-03 DIAGNOSIS — J449 Chronic obstructive pulmonary disease, unspecified: Secondary | ICD-10-CM | POA: Diagnosis not present

## 2016-12-03 DIAGNOSIS — I110 Hypertensive heart disease with heart failure: Secondary | ICD-10-CM | POA: Insufficient documentation

## 2016-12-03 DIAGNOSIS — R296 Repeated falls: Secondary | ICD-10-CM | POA: Insufficient documentation

## 2016-12-03 DIAGNOSIS — G8929 Other chronic pain: Secondary | ICD-10-CM | POA: Insufficient documentation

## 2016-12-03 DIAGNOSIS — R079 Chest pain, unspecified: Secondary | ICD-10-CM | POA: Diagnosis present

## 2016-12-03 DIAGNOSIS — I25810 Atherosclerosis of coronary artery bypass graft(s) without angina pectoris: Secondary | ICD-10-CM

## 2016-12-03 DIAGNOSIS — R9439 Abnormal result of other cardiovascular function study: Secondary | ICD-10-CM

## 2016-12-03 DIAGNOSIS — Z96653 Presence of artificial knee joint, bilateral: Secondary | ICD-10-CM | POA: Diagnosis not present

## 2016-12-03 DIAGNOSIS — I2584 Coronary atherosclerosis due to calcified coronary lesion: Secondary | ICD-10-CM | POA: Insufficient documentation

## 2016-12-03 DIAGNOSIS — Z87891 Personal history of nicotine dependence: Secondary | ICD-10-CM | POA: Insufficient documentation

## 2016-12-03 DIAGNOSIS — F329 Major depressive disorder, single episode, unspecified: Secondary | ICD-10-CM | POA: Diagnosis not present

## 2016-12-03 DIAGNOSIS — Z96611 Presence of right artificial shoulder joint: Secondary | ICD-10-CM | POA: Insufficient documentation

## 2016-12-03 DIAGNOSIS — I350 Nonrheumatic aortic (valve) stenosis: Secondary | ICD-10-CM | POA: Diagnosis not present

## 2016-12-03 DIAGNOSIS — Z8249 Family history of ischemic heart disease and other diseases of the circulatory system: Secondary | ICD-10-CM | POA: Insufficient documentation

## 2016-12-03 DIAGNOSIS — I2510 Atherosclerotic heart disease of native coronary artery without angina pectoris: Secondary | ICD-10-CM | POA: Diagnosis present

## 2016-12-03 DIAGNOSIS — E291 Testicular hypofunction: Secondary | ICD-10-CM | POA: Insufficient documentation

## 2016-12-03 DIAGNOSIS — M545 Low back pain: Secondary | ICD-10-CM | POA: Insufficient documentation

## 2016-12-03 DIAGNOSIS — F0150 Vascular dementia without behavioral disturbance: Secondary | ICD-10-CM | POA: Insufficient documentation

## 2016-12-03 DIAGNOSIS — Z8673 Personal history of transient ischemic attack (TIA), and cerebral infarction without residual deficits: Secondary | ICD-10-CM | POA: Insufficient documentation

## 2016-12-03 HISTORY — PX: CARDIAC CATHETERIZATION: SHX172

## 2016-12-03 SURGERY — LEFT HEART CATH AND CORS/GRAFTS ANGIOGRAPHY
Anesthesia: LOCAL

## 2016-12-03 MED ORDER — LIDOCAINE HCL (PF) 1 % IJ SOLN
INTRAMUSCULAR | Status: AC
Start: 2016-12-03 — End: 2016-12-03
  Filled 2016-12-03: qty 30

## 2016-12-03 MED ORDER — IOPAMIDOL (ISOVUE-370) INJECTION 76%
INTRAVENOUS | Status: AC
  Filled 2016-12-03: qty 125

## 2016-12-03 MED ORDER — LIDOCAINE HCL (PF) 1 % IJ SOLN
INTRAMUSCULAR | Status: DC | PRN
  Administered 2016-12-03: 15 mL

## 2016-12-03 MED ORDER — SODIUM CHLORIDE 0.9% FLUSH: 3.0000 mL | INTRAVENOUS | Status: DC | PRN

## 2016-12-03 MED ORDER — ASPIRIN 81 MG PO CHEW
CHEWABLE_TABLET | ORAL | Status: AC
  Administered 2016-12-03: 81 mg via ORAL
  Filled 2016-12-03: qty 1

## 2016-12-03 MED ORDER — ONDANSETRON HCL 4 MG/2ML IJ SOLN: 4.0000 mg | Freq: Four times a day (QID) | INTRAMUSCULAR | Status: DC | PRN

## 2016-12-03 MED ORDER — HEPARIN (PORCINE) IN NACL 2-0.9 UNIT/ML-% IJ SOLN
INTRAMUSCULAR | Status: DC | PRN
  Administered 2016-12-03: 1000 mL

## 2016-12-03 MED ORDER — SODIUM CHLORIDE 0.9 % IV SOLN: INTRAVENOUS | Status: DC

## 2016-12-03 MED ORDER — FENTANYL CITRATE (PF) 100 MCG/2ML IJ SOLN
INTRAMUSCULAR | Status: AC
  Filled 2016-12-03: qty 2

## 2016-12-03 MED ORDER — SODIUM CHLORIDE 0.9 % WEIGHT BASED INFUSION: 1.0000 mL/kg/h | INTRAVENOUS | Status: DC

## 2016-12-03 MED ORDER — MIDAZOLAM HCL 2 MG/2ML IJ SOLN
INTRAMUSCULAR | Status: DC | PRN
  Administered 2016-12-03: 1 mg via INTRAVENOUS

## 2016-12-03 MED ORDER — SODIUM CHLORIDE 0.9% FLUSH: 3.0000 mL | Freq: Two times a day (BID) | INTRAVENOUS | Status: DC

## 2016-12-03 MED ORDER — IOPAMIDOL (ISOVUE-370) INJECTION 76%
INTRAVENOUS | Status: DC | PRN
  Administered 2016-12-03: 120 mL via INTRAVENOUS

## 2016-12-03 MED ORDER — SODIUM CHLORIDE 0.9 % WEIGHT BASED INFUSION
3.0000 mL/kg/h | INTRAVENOUS | Status: AC
  Administered 2016-12-03: 3 mL/kg/h via INTRAVENOUS

## 2016-12-03 MED ORDER — ASPIRIN 81 MG PO CHEW
81.0000 mg | CHEWABLE_TABLET | ORAL | Status: AC
  Administered 2016-12-03: 81 mg via ORAL

## 2016-12-03 MED ORDER — SODIUM CHLORIDE 0.9 % IV SOLN: 250.0000 mL | INTRAVENOUS | Status: DC | PRN

## 2016-12-03 MED ORDER — ACETAMINOPHEN 325 MG PO TABS: 650.0000 mg | ORAL_TABLET | ORAL | Status: DC | PRN

## 2016-12-03 MED ORDER — FENTANYL CITRATE (PF) 100 MCG/2ML IJ SOLN
INTRAMUSCULAR | Status: DC | PRN
  Administered 2016-12-03: 50 ug via INTRAVENOUS

## 2016-12-03 MED ORDER — HEPARIN (PORCINE) IN NACL 2-0.9 UNIT/ML-% IJ SOLN
INTRAMUSCULAR | Status: AC
  Filled 2016-12-03: qty 1000

## 2016-12-03 MED ORDER — MIDAZOLAM HCL 2 MG/2ML IJ SOLN
INTRAMUSCULAR | Status: AC
  Filled 2016-12-03: qty 2

## 2016-12-03 SURGICAL SUPPLY — 11 items
CATH EXPO 5F IM (CATHETERS) ×2 IMPLANT
CATH EXPO 5FR FR4 (CATHETERS) ×2 IMPLANT
CATH INFINITI 5FR JL4 (CATHETERS) ×2 IMPLANT
CATH SITESEER 5F MULTI A 2 (CATHETERS) ×2 IMPLANT
DEVICE WIRE ANGIOSEAL 6FR (Vascular Products) ×2 IMPLANT
GUIDEWIRE ANGLED .035X150CM (WIRE) ×2 IMPLANT
KIT HEART LEFT (KITS) ×2 IMPLANT
PACK CARDIAC CATHETERIZATION (CUSTOM PROCEDURE TRAY) ×2 IMPLANT
SHEATH PINNACLE 5F 10CM (SHEATH) ×2 IMPLANT
TRANSDUCER W/STOPCOCK (MISCELLANEOUS) ×2 IMPLANT
WIRE EMERALD 3MM-J .035X150CM (WIRE) ×2 IMPLANT

## 2016-12-03 NOTE — Interval H&P Note (Signed)
Cath Lab Visit (complete for each Cath Lab visit)  Clinical Evaluation Leading to the Procedure:   ACS: No.  Non-ACS:    Anginal Classification: CCS III  Anti-ischemic medical therapy: Maximal Therapy (2 or more classes of medications)  Non-Invasive Test Results: Intermediate-risk stress test findings: cardiac mortality 1-3%/year  Prior CABG: Previous CABG      History and Physical Interval Note:  12/03/2016 12:52 PM  Aaron Berg  has presented today for surgery, with the diagnosis of abnormal stress test  The various methods of treatment have been discussed with the patient and family. After consideration of risks, benefits and other options for treatment, the patient has consented to  Procedure(s): Left Heart Cath and Cors/Grafts Angiography (N/A) as a surgical intervention .  The patient's history has been reviewed, patient examined, no change in status, stable for surgery.  I have reviewed the patient's chart and labs.  Questions were answered to the patient's satisfaction.     [name]

## 2016-12-03 NOTE — Progress Notes (Signed)
Assumed care of pt from Bridget Fairies, RN. Assessment documented. 

## 2016-12-03 NOTE — Discharge Instructions (Signed)
Femoral Site Care °Introduction °Refer to this sheet in the next few weeks. These instructions provide you with information about caring for yourself after your procedure. Your health care provider may also give you more specific instructions. Your treatment has been planned according to current medical practices, but problems sometimes occur. Call your health care provider if you have any problems or questions after your procedure. °What can I expect after the procedure? °After your procedure, it is typical to have the following: °· Bruising at the site that usually fades within 1-2 weeks. °· Blood collecting in the tissue (hematoma) that may be painful to the touch. It should usually decrease in size and tenderness within 1-2 weeks. °Follow these instructions at home: °· Take medicines only as directed by your health care provider. °· You may shower 24-48 hours after the procedure or as directed by your health care provider. Remove the bandage (dressing) and gently wash the site with plain soap and water. Pat the area dry with a clean towel. Do not rub the site, because this may cause bleeding. °· Do not take baths, swim, or use a hot tub until your health care provider approves. °· Check your insertion site every day for redness, swelling, or drainage. °· Do not apply powder or lotion to the site. °· Limit use of stairs to twice a day for the first 2-3 days or as directed by your health care provider. °· Do not squat for the first 2-3 days or as directed by your health care provider. °· Do not lift over 10 lb (4.5 kg) for 5 days after your procedure or as directed by your health care provider. °· Ask your health care provider when it is okay to: °¨ Return to work or school. °¨ Resume usual physical activities or sports. °¨ Resume sexual activity. °· Do not drive home if you are discharged the same day as the procedure. Have someone else drive you. °· You may drive 24 hours after the procedure unless otherwise  instructed by your health care provider. °· Do not operate machinery or power tools for 24 hours after the procedure or as directed by your health care provider. °· If your procedure was done as an outpatient procedure, which means that you went home the same day as your procedure, a responsible adult should be with you for the first 24 hours after you arrive home. °· Keep all follow-up visits as directed by your health care provider. This is important. °Contact a health care provider if: °· You have a fever. °· You have chills. °· You have increased bleeding from the site. Hold pressure on the site. °Get help right away if: °· You have unusual pain at the site. °· You have redness, warmth, or swelling at the site. °· You have drainage (other than a small amount of blood on the dressing) from the site. °· The site is bleeding, and the bleeding does not stop after 30 minutes of holding steady pressure on the site. °· Your leg or foot becomes pale, cool, tingly, or numb. °This information is not intended to replace advice given to you by your health care provider. Make sure you discuss any questions you have with your health care provider. °Document Released: 08/04/2014 Document Revised: 05/08/2016 Document Reviewed: 06/20/2014 °© 2017 [employer] ° °

## 2016-12-03 NOTE — H&P (View-Only) (Signed)
Cardiology Office Note   Date:  11/28/2016   ID:  Aaron Berg, DOB [birth year], MRN [id number]  PCP:  Wyatt Haste, MD  Cardiologist:  Dr. Tamala Julian    Chief Complaint  Patient presents with  . Atrial Fibrillation      History of Present Illness: Aaron Berg is a 80 y.o. male who presents for abnormal stress test.   He has a history of CAD s/p CABG 2003, last cath 2012 with patent LIMA t LAD, patent VG to RCA and 50% ostial stenosis of SVG to LCX.  EF at that time 35%.  He has chronic combined CHF, mild aortic stenosis/mitral regurg by echo 09/2016, HTN, HLD, paroxysmal atrial fib on Eliquis, OSA on CPAP, COPD, microvascular dementia, CVA, falls who presents for evaluation of irregular HR noted by [facility]. Per phone notes the patient was noted to have HR in the upper 40s and low 50s with irregularly felt. Recent adm 09/2016 for chest pain, atypical, ruled out for MI. During that say he was noted to maintain NSR with PVCs with HR 40s-50s, the bradycardia prompting reduction in beta blocker therapy and ultimate discontinuation given HR 41. 2D echo 10/08/16: EF 40-45%, grade 2 DD, mild aortic stenosis, mild mitral regurgitation. Outpatient nuc was recommended. Labs notable for Hgb 12.3, Cr 1.09, K 3.9.  Nuc was ordered on last visit with EF 41%, (30-44%)  Was listed as high risk and reviewed by Dr. Tamala Julian and he would like pt to have cardiac cath and need to be off Eliquis for 48 hours prior to procedure.  No ischemia. EF in 2015 was  45%.  Dr. Tamala Julian has reviewed and recommends cardiac cath.  It has been 5 years since the last one.    Today pt has no complaints mild cough and rhonchi.  No chest pain but he has never had chest pain.  His home BP monitor notes HR in 30s, most likely with PVCs.  No tachycardia. His BB was stopped for slow HR in the hospital.  Pt is agreeable to cath as well as daughter.      Past Medical History:  Diagnosis Date  . Aortic stenosis   . Arthritis    "thumbs, wrists, legs" (10/06/2016)  . Atrial flutter ([place])   . CAD (coronary artery disease)    a. s/p CABG 2003.  [name] Cerebrovascular disease 2006   multiple ischemic changes on prior MRI  . Chronic combined systolic and diastolic CHF (congestive heart failure) ([place])   . Chronic lower back pain   . Complication of anesthesia    "woke up too soon"  . COPD (chronic obstructive pulmonary disease) ([place])    "no signs or tests"  . CVA (cerebral vascular accident) ([place])   . Dementia   . Depression   . Dry eyes   . Dyslipidemia    well controlled  . Gait disorder   . Heart murmur    "found in ~ 1946"  . Hypertension   . Hypertensive cardiovascular disease   . Male hypogonadism   . Migraine    "major one when I was a kid; very painful"  . Mitral regurgitation   . Multiple falls    "after both knees surgeries; went to rehab; came out and fell all the time" (10/06/2016)  . Obesity   . OSA on CPAP    cpap  . Paroxysmal atrial fibrillation (HCC)   . Skin cancer of anterior chest   . Spinal stenosis  Past Surgical History:  Procedure Laterality Date  . APPENDECTOMY  as child  . CARDIAC CATHETERIZATION  11/2003   Archie Endo 04/29/2011  . CARDIOVERSION N/A 04/26/2014   Procedure: CARDIOVERSION;  Surgeon: Sinclair Grooms, MD;  Location: [facility] ENDOSCOPY;  Service: Cardiovascular;  Laterality: N/A;  . CARDIOVERSION N/A 05/24/2014   Procedure: CARDIOVERSION;  Surgeon: Sinclair Grooms, MD;  Location: [place];  Service: Cardiovascular;  Laterality: N/A;  . CATARACT EXTRACTION W/ INTRAOCULAR LENS  IMPLANT, BILATERAL Bilateral 2000s  . CORONARY ANGIOPLASTY  1991   "no stents" (10/06/2016)  . CORONARY ARTERY BYPASS GRAFT  11/2002   X 3/notes 04/29/2011  . CYST EXCISION     lumbar; Dr. Joya Salm  . EYE SURGERY Right    detached retina  . JOINT REPLACEMENT    . MOHS SURGERY     on chest  . RETINAL DETACHMENT SURGERY Right   . SHOULDER SURGERY Right    Right with murphy, reattach  ligaments  . TONSILLECTOMY  as child  . TOTAL KNEE ARTHROPLASTY Right 01/02/2014   Procedure: RIGHT TOTAL KNEE ARTHROPLASTY;  Surgeon: Mauri Pole, MD;  Location: [facility] ORS;  Service: Orthopedics;  Laterality: Right;  . TOTAL KNEE ARTHROPLASTY Left 01/31/2014   Procedure: LEFT TOTAL KNEE ARTHROPLASTY;  Surgeon: Mauri Pole, MD;  Location: [facility] ORS;  Service: Orthopedics;  Laterality: Left;  . TOTAL SHOULDER ARTHROPLASTY Right   . VIDEO ASSISTED THORACOSCOPY (VATS)/THOROCOTOMY  2003   benign nodule     Current Outpatient Prescriptions  Medication Sig Dispense Refill  . apixaban (ELIQUIS) 5 MG TABS tablet Take 1 tablet (5 mg total) by mouth 2 (two) times daily. 180 tablet 3  . buPROPion (WELLBUTRIN SR) 150 MG 12 hr tablet TAKE 1 TABLET TWICE A DAY 180 tablet 0  . cholecalciferol (VITAMIN D) 1000 UNITS tablet Take 1,000 Units by mouth daily.     [name] donepezil (ARICEPT) 23 MG TABS tablet TAKE 1 TABLET AT BEDTIME 90 tablet 1  . feeding supplement, ENSURE ENLIVE, (ENSURE ENLIVE) LIQD Take 237 mLs by mouth 2 (two) times daily between meals. 237 mL 12  . fexofenadine (ALLEGRA) 180 MG tablet Take 180 mg by mouth daily.    [name] FLUoxetine (PROZAC) 10 MG capsule Take 1 capsule (10 mg total) by mouth daily. 90 capsule 0  . folic acid (FOLVITE) 1 MG tablet Take 1 tablet (1 mg total) by mouth daily. 30 tablet 1  . Multiple Vitamins-Minerals (MULTIVITAMIN WITH MINERALS) tablet Take 1 tablet by mouth daily. Reported on 01/30/2016    . nitroGLYCERIN (NITROSTAT) 0.4 MG SL tablet Place 1 tablet (0.4 mg total) under the tongue every 5 (five) minutes as needed for chest pain. 25 tablet 12  . pantoprazole (PROTONIX) 40 MG tablet TAKE 1 TABLET(40 MG) BY MOUTH DAILY 90 tablet 1  . rosuvastatin (CRESTOR) 40 MG tablet TAKE 1 TABLET DAILY WITH BREAKFAST FOR HYPERLIPIDEMIA (KEEP UPCOMING APPOINTMENT IN ORDER TO RECEIVE ADDITIONAL REFILLS) 90 tablet 3  . sodium chloride (OCEAN) 0.65 % SOLN nasal spray Place 1 spray into both  nostrils daily as needed for congestion.     . Testosterone 20.25 MG/1.25GM (1.62%) GEL Apply 2 Squirts topically daily as needed (occasionally). One pump per shoulder.     No current facility-administered medications for this visit.     Allergies:   Procardia [nifedipine]    Social History:  The patient  reports that he quit smoking about 27 years ago. His smoking use included Cigarettes. He has a  66.00 pack-year smoking history. He has never used smokeless tobacco. He reports that he drinks about 8.4 oz of alcohol per week . He reports that he does not use drugs.   Family History:  The patient's family history includes Arthritis in his sister; CAD in his father and mother; Healthy in his sister; Pancreatic cancer in his father.    ROS:  General:no colds or fevers, no weight changes- wt is down  Skin:no rashes or ulcers HEENT:no blurred vision, no congestion CV:see HPI PUL:see HPI GI:no diarrhea constipation or melena, no indigestion GU:no hematuria, no dysuria MS:no joint pain, no claudication Neuro:no syncope, no lightheadedness Endo:no diabetes, no thyroid disease  Wt Readings from Last 3 Encounters:  11/28/16 216 lb (98 kg)  11/17/16 203 lb (92.1 kg)  11/03/16 215 lb (97.5 kg)     PHYSICAL EXAM: VS:  BP 132/72   Pulse 72   Ht 5\' 10"  (1.778 m)   Wt 216 lb (98 kg)   BMI 30.99 kg/m  , BMI Body mass index is 30.99 kg/m. General:Pleasant affect, NAD Skin:Warm and dry, brisk capillary refill HEENT:normocephalic, sclera clear, mucus membranes moist Neck:supple, no JVD, no bruits  Heart:S1S2 RRR without murmur, gallup, rub or click Lungs:clear without rales, + rhonchi, no current wheezes but he does at night wheezes [id number], non tender, + BS, do not palpate liver spleen or masses Ext:tr to 1+ lower ext edema, 2+ pedal pulses, 2+ radial pulses Neuro:alert and oriented X 3, MAE, follows commands, + facial symmetry    EKG:  EKG is NOT ordered today.    Recent  Labs: 01/09/2016: TSH 1.501 10/06/2016: ALT 23; BUN 23; Creatinine, Ser 1.09; Potassium 3.9; Sodium 140 10/08/2016: Hemoglobin 12.3; Platelets 106    Lipid Panel    Component Value Date/Time   CHOL 191 01/09/2016 0001   TRIG 251 (H) 01/09/2016 0001   HDL 56 01/09/2016 0001   CHOLHDL 3.4 01/09/2016 0001   VLDL 50 (H) 01/09/2016 0001   LDLCALC 85 01/09/2016 0001       Other studies Reviewed: Additional studies/ records that were reviewed today include: . Nuc study:       Nuclear stress EF: 41%.  There was no ST segment deviation noted during stress.  There is a medium defect of moderate severity present in the basal inferior, mid inferior, apical lateral and apex location. The defect is non-reversible and consistent with prior scar of diaphragmatic attenuation artifact.  This is a high risk study.  The left ventricular ejection fraction is moderately decreased (30-44%).     ECHO: Study Conclusions  - Left ventricle: The cavity size was moderately dilated. Systolic   function was mildly to moderately reduced. The estimated ejection   fraction was in the range of 40% to 45%. Diffuse hypokinesis.   Features are consistent with a pseudonormal left ventricular   filling pattern, with concomitant abnormal relaxation and   increased filling pressure (grade 2 diastolic dysfunction). - Aortic valve: Moderately calcified annulus. Trileaflet;   moderately thickened, moderately calcified leaflets. There was   mild stenosis. Valve area (VTI): 1.14 cm^2. Valve area (Vmax):   1.18 cm^2. Valve area (Vmean): 1.08 cm^2. - Mitral valve: There was mild regurgitation.   ASSESSMENT AND PLAN:  1. Abnormal nuc study after admit for chest pain though neg. MI.  Dr. Tamala Julian has reviewed nuc and will proceed with cath with Dr. Tamala Julian next week.   Pt and daughter agree.  The patient understands that risks included but are not limited  to stroke (1 in 1000), death (1 in 28), kidney failure  [usually temporary] (1 in 500), bleeding (1 in 200), allergic reaction [possibly serious] (1 in 200).   We will have him stop Eliquis after dose Sunday for cath on Wed.  Did not add asa yet.    2.  CAD with hx CABG in 2004.  Last cath 2012 with some VG disease.   3. PAF currently in SR.    4. Bradycardia now off BB since prior to discharge 10/08/16. .   5. Chronic combined CHF euvolemic.   6.  Mild bronchitis today.    Current medicines are reviewed with the patient today.  The patient Has no concerns regarding medicines.  The following changes have been made:  See above Labs/ tests ordered today include:see above  Disposition:   FU:  see above  Signed, Cecilie Kicks, NP  11/28/2016 2:31 PM    [facility] [place], [address] [place], [place] Phone: [phone]; Fax: [phone]

## 2016-12-04 ENCOUNTER — Encounter (HOSPITAL_COMMUNITY): Payer: Self-pay | Admitting: Interventional Cardiology

## 2016-12-09 DIAGNOSIS — R269 Unspecified abnormalities of gait and mobility: Secondary | ICD-10-CM | POA: Diagnosis not present

## 2016-12-09 DIAGNOSIS — I110 Hypertensive heart disease with heart failure: Secondary | ICD-10-CM | POA: Diagnosis not present

## 2016-12-16 DIAGNOSIS — D485 Neoplasm of uncertain behavior of skin: Secondary | ICD-10-CM | POA: Diagnosis not present

## 2016-12-16 DIAGNOSIS — D1801 Hemangioma of skin and subcutaneous tissue: Secondary | ICD-10-CM | POA: Diagnosis not present

## 2016-12-16 DIAGNOSIS — R269 Unspecified abnormalities of gait and mobility: Secondary | ICD-10-CM | POA: Diagnosis not present

## 2016-12-16 DIAGNOSIS — C44612 Basal cell carcinoma of skin of right upper limb, including shoulder: Secondary | ICD-10-CM | POA: Diagnosis not present

## 2016-12-16 DIAGNOSIS — L570 Actinic keratosis: Secondary | ICD-10-CM | POA: Diagnosis not present

## 2016-12-16 DIAGNOSIS — C44519 Basal cell carcinoma of skin of other part of trunk: Secondary | ICD-10-CM | POA: Diagnosis not present

## 2016-12-16 DIAGNOSIS — I110 Hypertensive heart disease with heart failure: Secondary | ICD-10-CM | POA: Diagnosis not present

## 2016-12-16 DIAGNOSIS — H903 Sensorineural hearing loss, bilateral: Secondary | ICD-10-CM | POA: Diagnosis not present

## 2016-12-16 DIAGNOSIS — L821 Other seborrheic keratosis: Secondary | ICD-10-CM | POA: Diagnosis not present

## 2016-12-22 DIAGNOSIS — R269 Unspecified abnormalities of gait and mobility: Secondary | ICD-10-CM | POA: Diagnosis not present

## 2016-12-22 DIAGNOSIS — I110 Hypertensive heart disease with heart failure: Secondary | ICD-10-CM | POA: Diagnosis not present

## 2016-12-30 DIAGNOSIS — I110 Hypertensive heart disease with heart failure: Secondary | ICD-10-CM | POA: Diagnosis not present

## 2016-12-30 DIAGNOSIS — R269 Unspecified abnormalities of gait and mobility: Secondary | ICD-10-CM | POA: Diagnosis not present

## 2017-01-06 DIAGNOSIS — R269 Unspecified abnormalities of gait and mobility: Secondary | ICD-10-CM | POA: Diagnosis not present

## 2017-01-06 DIAGNOSIS — I110 Hypertensive heart disease with heart failure: Secondary | ICD-10-CM | POA: Diagnosis not present

## 2017-01-13 DIAGNOSIS — I110 Hypertensive heart disease with heart failure: Secondary | ICD-10-CM | POA: Diagnosis not present

## 2017-01-13 DIAGNOSIS — R269 Unspecified abnormalities of gait and mobility: Secondary | ICD-10-CM | POA: Diagnosis not present

## 2017-01-14 ENCOUNTER — Telehealth: Payer: Self-pay

## 2017-01-14 NOTE — Telephone Encounter (Signed)
I completed a pa for donepezil 23 mg tablet. Sent to [employer]. Should have a determination in 1-3 business days.  PA was approved. [id number]. Start Date:12/15/2016;Coverage End Date:01/14/2018

## 2017-01-19 DIAGNOSIS — I110 Hypertensive heart disease with heart failure: Secondary | ICD-10-CM | POA: Diagnosis not present

## 2017-01-19 DIAGNOSIS — R269 Unspecified abnormalities of gait and mobility: Secondary | ICD-10-CM | POA: Diagnosis not present

## 2017-01-26 DIAGNOSIS — R269 Unspecified abnormalities of gait and mobility: Secondary | ICD-10-CM | POA: Diagnosis not present

## 2017-01-26 DIAGNOSIS — I110 Hypertensive heart disease with heart failure: Secondary | ICD-10-CM | POA: Diagnosis not present

## 2017-02-12 ENCOUNTER — Other Ambulatory Visit: Payer: Self-pay | Admitting: Family Medicine

## 2017-02-12 NOTE — Telephone Encounter (Signed)
Are these okay to refill? 

## 2017-02-17 ENCOUNTER — Telehealth: Payer: Self-pay | Admitting: Family Medicine

## 2017-02-17 NOTE — Telephone Encounter (Signed)
Ok

## 2017-02-17 NOTE — Telephone Encounter (Signed)
Pt daughter called and is wanting another order for PT to come out, states that he is till having some leg weakness, states that he was out one day and feel, while he was out, because his legs gave out on him. She can can be reached at [phone]

## 2017-02-17 NOTE — Telephone Encounter (Signed)
Talked to daughter and told her I would try to get it done

## 2017-02-17 NOTE — Telephone Encounter (Signed)
Gave verbal for Alvis Lemmings to go back out for P/T pt coming in for a face to face I Have faxed over last ov note from Nov [phone]

## 2017-02-18 DIAGNOSIS — G250 Essential tremor: Secondary | ICD-10-CM | POA: Diagnosis not present

## 2017-02-18 DIAGNOSIS — R296 Repeated falls: Secondary | ICD-10-CM | POA: Diagnosis not present

## 2017-02-20 DIAGNOSIS — G250 Essential tremor: Secondary | ICD-10-CM | POA: Diagnosis not present

## 2017-02-20 DIAGNOSIS — R296 Repeated falls: Secondary | ICD-10-CM | POA: Diagnosis not present

## 2017-02-23 ENCOUNTER — Ambulatory Visit: Payer: Self-pay | Admitting: Family Medicine

## 2017-02-26 ENCOUNTER — Encounter: Payer: Self-pay | Admitting: Family Medicine

## 2017-02-26 ENCOUNTER — Ambulatory Visit (INDEPENDENT_AMBULATORY_CARE_PROVIDER_SITE_OTHER): Payer: Medicare Other | Admitting: Family Medicine

## 2017-02-26 VITALS — BP 130/70 | HR 60 | Ht 70.0 in | Wt 211.0 lb

## 2017-02-26 DIAGNOSIS — R351 Nocturia: Secondary | ICD-10-CM

## 2017-02-26 DIAGNOSIS — R296 Repeated falls: Secondary | ICD-10-CM

## 2017-02-26 DIAGNOSIS — F329 Major depressive disorder, single episode, unspecified: Secondary | ICD-10-CM

## 2017-02-26 DIAGNOSIS — J309 Allergic rhinitis, unspecified: Secondary | ICD-10-CM

## 2017-02-26 DIAGNOSIS — N401 Enlarged prostate with lower urinary tract symptoms: Secondary | ICD-10-CM

## 2017-02-26 MED ORDER — FLUOXETINE HCL 20 MG PO TABS: 20.0000 mg | ORAL_TABLET | Freq: Every day | ORAL | 3 refills | Status: DC

## 2017-02-26 MED ORDER — FINASTERIDE 5 MG PO TABS: 5.0000 mg | ORAL_TABLET | Freq: Every day | ORAL | 3 refills | Status: DC

## 2017-02-26 NOTE — Patient Instructions (Addendum)
Start on 20 mg of Prozac and use up what you have first Switch to Zyrtec and also then Rhinocort nasal spray. Give them a couple weeks but if no improvement let me know

## 2017-02-26 NOTE — Progress Notes (Signed)
   Subjective:    Patient ID: Aaron Berg, male    DOB: 07/28/1936, 81 y.o.   MRN: [id number]  HPI He is here for consult concerning multiple issues. He does complain of polyuria, hesitancy, decreased stream and incomplete emptying. However later on he also mentioned symptoms consistent with urinary urgency. He also complains of continued difficulty with rhinorrhea and PND. He has tried [employer] as well as Flonase without much success. He continues have difficulty with back and leg pain and has seen Dr. Joya Salm in the past for this. Does use a cane as well as a walker and does have difficulty with falls. Most recently he has had difficulty with depression to the point that he did not even leave his apartment for several weeks. He complains of difficulty with feeling like he is in a black hole, being depressed with anhedonia. His daughter is here with him and does indicate the same.   Review of Systems     Objective:   Physical Exam Alert and in no distress otherwise not examined       Assessment & Plan:  BPH associated with nocturia - Plan: finasteride (PROSCAR) 5 MG tablet  Multiple falls  Major depressive disorder with single episode, remission status unspecified - Plan: FLUoxetine (PROZAC) 20 MG tablet  Chronic allergic rhinitis, unspecified seasonality, unspecified trigger I will start him on finasteride to help with his BPH symptoms. May also need to look at overactive bladder issues as it was difficult to get a true good history from him. Encouraged him to stay as physically active as possible. I'll have him increase his Prozac to 80 mg. Also recommend he switch to Zyrtec and Rhinocort as if this will help with his underlying allergy symptoms. Follow-up one month

## 2017-02-27 DIAGNOSIS — R296 Repeated falls: Secondary | ICD-10-CM | POA: Diagnosis not present

## 2017-02-27 DIAGNOSIS — G250 Essential tremor: Secondary | ICD-10-CM | POA: Diagnosis not present

## 2017-03-03 ENCOUNTER — Encounter: Payer: Self-pay | Admitting: Nurse Practitioner

## 2017-03-03 ENCOUNTER — Telehealth: Payer: Self-pay | Admitting: Interventional Cardiology

## 2017-03-03 DIAGNOSIS — G250 Essential tremor: Secondary | ICD-10-CM | POA: Diagnosis not present

## 2017-03-03 DIAGNOSIS — R296 Repeated falls: Secondary | ICD-10-CM | POA: Diagnosis not present

## 2017-03-03 NOTE — Telephone Encounter (Signed)
New message    Pt daughter is calling about pt.   Pt c/o Shortness Of Breath: STAT if SOB developed within the last 24 hours or pt is noticeably SOB on the phone  1. Are you currently SOB (can you hear that pt is SOB on the phone)? Per pt daughter, pt is sob is when he lays down.   2. How long have you been experiencing SOB? A couple days  3. Are you SOB when sitting or when up moving around? When pt lays down.  4. Are you currently experiencing any other symptoms? No. Per pt daughter, he states when he lays down he feels like his heart is pounding really hard.    appt scheduled for pt on 3/21 8:30am with Truitt Merle

## 2017-03-03 NOTE — Telephone Encounter (Signed)
Left message on daughter's number advising her to keep appt in the morning with Truitt Merle, NP and to proceed to ER if pt has worsening sx during the night.

## 2017-03-04 ENCOUNTER — Encounter: Payer: Self-pay | Admitting: Family Medicine

## 2017-03-04 ENCOUNTER — Ambulatory Visit (INDEPENDENT_AMBULATORY_CARE_PROVIDER_SITE_OTHER): Payer: Medicare Other | Admitting: Nurse Practitioner

## 2017-03-04 ENCOUNTER — Telehealth: Payer: Self-pay | Admitting: Family Medicine

## 2017-03-04 ENCOUNTER — Encounter: Payer: Self-pay | Admitting: Nurse Practitioner

## 2017-03-04 VITALS — BP 112/62 | HR 84 | Ht 69.0 in | Wt 212.0 lb

## 2017-03-04 DIAGNOSIS — R0602 Shortness of breath: Secondary | ICD-10-CM

## 2017-03-04 DIAGNOSIS — I5042 Chronic combined systolic (congestive) and diastolic (congestive) heart failure: Secondary | ICD-10-CM | POA: Diagnosis not present

## 2017-03-04 DIAGNOSIS — I2510 Atherosclerotic heart disease of native coronary artery without angina pectoris: Secondary | ICD-10-CM | POA: Diagnosis not present

## 2017-03-04 DIAGNOSIS — R001 Bradycardia, unspecified: Secondary | ICD-10-CM | POA: Diagnosis not present

## 2017-03-04 MED ORDER — TRAMADOL HCL 50 MG PO TABS: 50.0000 mg | ORAL_TABLET | Freq: Three times a day (TID) | ORAL | 1 refills | Status: DC | PRN

## 2017-03-04 MED ORDER — FUROSEMIDE 20 MG PO TABS: 40.0000 mg | ORAL_TABLET | Freq: Every day | ORAL | 3 refills | Status: DC

## 2017-03-04 NOTE — Patient Instructions (Addendum)
We will be checking the following labs today - BMET, CBC, BNP and HPF   Medication Instructions:    Continue with your current medicines. BUT  I am adding Lasix 40 mg to take for 3 days and then cut to 20 mg a day  STOP the Naproxen  Talk with Dr. Redmond School about other options for pain management    Testing/Procedures To Be Arranged:  N/A  Follow-Up:   See me in 2 weeks - will plan to repeat labs - do not need to fast    Other Special Instructions:   Cut back on the frozen dinners as much as possible    If you need a refill on your cardiac medications before your next appointment, please call your pharmacy.   Call the [place] office at [phone] if you have any questions, problems or concerns.

## 2017-03-04 NOTE — Telephone Encounter (Signed)
Patient was seen recently by Dr. Tamala Julian who recommended stopping all NSAIDs. I will switch to tramadol and caution against using this to aggressively.

## 2017-03-04 NOTE — Progress Notes (Signed)
CARDIOLOGY OFFICE NOTE  Date:  03/04/2017    Aaron Berg Date of Birth: [birth year] Medical Record #[id number]  PCP:  Wyatt Haste, MD  Cardiologist:  Tamala Julian   Chief Complaint  Patient presents with  . Shortness of Breath    Work in visit - seen for Dr. Tamala Julian    History of Present Illness: Aaron Berg is a 82 y.o. male who presents today for a work in visit. Seen for Dr. Tamala Julian.   He has a history of CAD s/p CABG 2003, prior cath 2012 with patent LIMA to LAD, patent SVG to RCA and 50% ostial stenosis of SVG to LCX.  EF at that time 35%.  He has chronic combined CHF, mild aortic stenosis/mitral regurg by echo 09/2016, HTN, HLD, paroxysmal atrial fib on Eliquis, OSA on CPAP, COPD, microvascular dementia, CVA, and falls.   Seen back in December for evaluation of irregular HR noted by [facility]. Per phone notes the patient was noted to have HR in the upper 40s and low 50s with irregularly felt. Recent adm 09/2016 for chest pain, atypical, ruled out for MI. During that say he was noted to maintain NSR with PVCs with HR 40s-50s, the bradycardia prompting reduction in beta blocker therapy and ultimate discontinuation given HR 41. 2D echo 10/08/16: EF 40-45%, grade 2 DD, mild aortic stenosis, mild mitral regurgitation. Outpatient nuc was recommended. This turned out to be high risk. Ended up getting recathed - see below - continued medical management recommended.   Phone call yesterday - multiple complaints - thus added to my schedule for today.   Comes in today. Here with his daughter. She augments most of the history. Has not felt well for about 2 weeks. Notes more PND/orthopnea at night. Weight up just a few pounds. Not really short of breath during the day unless he really exerts - and he does not really exert himself due to back pain. Taking chronic NSAID. Eating frozen dinners. Some chest "aching" when he feels short of breath with lying down. No syncope. Chronically dizzy.  Poor sleep.   Past Medical History:  Diagnosis Date  . Aortic stenosis   . Arthritis    "thumbs, wrists, legs" (10/06/2016)  . Atrial flutter ([place])   . CAD (coronary artery disease)    a. s/p CABG 2003.  [name] Cerebrovascular disease 2006   multiple ischemic changes on prior MRI  . Chronic combined systolic and diastolic CHF (congestive heart failure) ([place])   . Chronic lower back pain   . Complication of anesthesia    "woke up too soon"  . COPD (chronic obstructive pulmonary disease) ([place])    "no signs or tests"  . CVA (cerebral vascular accident) ([place])   . Dementia   . Depression   . Dry eyes   . Dyslipidemia    well controlled  . Gait disorder   . Heart murmur    "found in ~ 1946"  . Hypertension   . Hypertensive cardiovascular disease   . Male hypogonadism   . Migraine    "major one when I was a kid; very painful"  . Mitral regurgitation   . Multiple falls    "after both knees surgeries; went to rehab; came out and fell all the time" (10/06/2016)  . Obesity   . OSA on CPAP    cpap  . Paroxysmal atrial fibrillation (HCC)   . Skin cancer of anterior chest   . Spinal stenosis     Past  Surgical History:  Procedure Laterality Date  . APPENDECTOMY  as child  . CARDIAC CATHETERIZATION  11/2003   Archie Endo 04/29/2011  . CARDIAC CATHETERIZATION N/A 12/03/2016   Procedure: Left Heart Cath and Cors/Grafts Angiography;  Surgeon: Belva Crome, MD;  Location: [facility];  Service: Cardiovascular;  Laterality: N/A;  . CARDIOVERSION N/A 04/26/2014   Procedure: CARDIOVERSION;  Surgeon: Sinclair Grooms, MD;  Location[facility] ENDOSCOPY;  Service: Cardiovascular;  Laterality: N/A;  . CARDIOVERSION N/A 05/24/2014   Procedure: CARDIOVERSION;  Surgeon: Sinclair Grooms, MD;  Location: [facility];  Service: Cardiovascular;  Laterality: N/A;  . CATARACT EXTRACTION W/ INTRAOCULAR LENS  IMPLANT, BILATERAL Bilateral 2000s  . CORONARY ANGIOPLASTY  1991   "no stents" (10/06/2016)  .  CORONARY ARTERY BYPASS GRAFT  11/2002   X 3/notes 04/29/2011  . CYST EXCISION     lumbar; Dr. Joya Salm  . EYE SURGERY Right    detached retina  . JOINT REPLACEMENT    . MOHS SURGERY     on chest  . RETINAL DETACHMENT SURGERY Right   . SHOULDER SURGERY Right    Right with murphy, reattach ligaments  . TONSILLECTOMY  as child  . TOTAL KNEE ARTHROPLASTY Right 01/02/2014   Procedure: RIGHT TOTAL KNEE ARTHROPLASTY;  Surgeon: Mauri Pole, MD;  Location: [facility];  Service: Orthopedics;  Laterality: Right;  . TOTAL KNEE ARTHROPLASTY Left 01/31/2014   Procedure: LEFT TOTAL KNEE ARTHROPLASTY;  Surgeon: Mauri Pole, MD;  Location: [facility];  Service: Orthopedics;  Laterality: Left;  . TOTAL SHOULDER ARTHROPLASTY Right   . VIDEO ASSISTED THORACOSCOPY (VATS)/THOROCOTOMY  2003   benign nodule     Medications: Current Outpatient Prescriptions  Medication Sig Dispense Refill  . Budesonide (RHINOCORT ALLERGY NA) Place 1 spray into the nose daily.    . cetirizine (ZYRTEC) 10 MG tablet Take 10 mg by mouth daily.    [name] apixaban (ELIQUIS) 5 MG TABS tablet Take 1 tablet (5 mg total) by mouth 2 (two) times daily. 180 tablet 3  . buPROPion (WELLBUTRIN SR) 150 MG 12 hr tablet TAKE 1 TABLET TWICE A DAY 180 tablet 0  . cholecalciferol (VITAMIN D) 1000 UNITS tablet Take 1,000 Units by mouth daily.     [name] donepezil (ARICEPT) 23 MG TABS tablet TAKE 1 TABLET AT BEDTIME 90 tablet 1  . feeding supplement, ENSURE ENLIVE, (ENSURE ENLIVE) LIQD Take 237 mLs by mouth 2 (two) times daily between meals. 237 mL 12  . fexofenadine (ALLEGRA) 180 MG tablet Take 180 mg by mouth daily.    . finasteride (PROSCAR) 5 MG tablet Take 1 tablet (5 mg total) by mouth daily. 90 tablet 3  . FLUoxetine (PROZAC) 20 MG tablet Take 1 tablet (20 mg total) by mouth daily. 30 tablet 3  . folic acid (FOLVITE) 1 MG tablet Take 1 tablet (1 mg total) by mouth daily. 30 tablet 1  . furosemide (LASIX) 20 MG tablet Take 2 tablets (40 mg total) by mouth  daily. 40 mg daily for 3 days, then decrease to 20 mg a day. 90 tablet 3  . Multiple Vitamins-Minerals (MULTIVITAMIN WITH MINERALS) tablet Take 1 tablet by mouth daily. Reported on 01/30/2016    . naproxen sodium (ANAPROX) 550 MG tablet Take 550 mg by mouth 2 (two) times daily as needed for mild pain.    . nitroGLYCERIN (NITROSTAT) 0.4 MG SL tablet Place 1 tablet (0.4 mg total) under the tongue every 5 (five) minutes as needed for  chest pain. (Patient not taking: Reported on 02/26/2017) 25 tablet 12  . rosuvastatin (CRESTOR) 40 MG tablet TAKE 1 TABLET DAILY WITH BREAKFAST FOR HYPERLIPIDEMIA (KEEP UPCOMING APPOINTMENT IN ORDER TO RECEIVE ADDITIONAL REFILLS) 90 tablet 3  . sodium chloride (OCEAN) 0.65 % SOLN nasal spray Place 1 spray into both nostrils daily as needed for congestion.     . Testosterone 20.25 MG/1.25GM (1.62%) GEL Apply 2 Squirts topically daily as needed (occasionally). One pump per shoulder.     No current facility-administered medications for this visit.     Allergies: Allergies  Allergen Reactions  . Procardia [Nifedipine] Rash    All over body    Social History: The patient  reports that he quit smoking about 27 years ago. His smoking use included Cigarettes. He has a 66.00 pack-year smoking history. He has never used smokeless tobacco. He reports that he drinks about 8.4 oz of alcohol per week . He reports that he does not use drugs.   Family History: The patient's family history includes Arthritis in his sister; CAD in his father and mother; Healthy in his sister; Pancreatic cancer in his father.   Review of Systems: Please see the history of present illness.   Otherwise, the review of systems is positive for none.   All other systems are reviewed and negative.   Physical Exam: VS:  BP 112/62   Pulse 84   Ht 5\' 9"  (1.753 m)   Wt 212 lb (96.2 kg)   BMI 31.31 kg/m  .  BMI Body mass index is 31.31 kg/m.  Wt Readings from Last 3 Encounters:  03/04/17 212 lb (96.2  kg)  02/26/17 211 lb (95.7 kg)  12/03/16 210 lb (95.3 kg)    General: Pleasant. Elderly male. Chronically ill appearing but in no acute distress.   HEENT: Normal.  Neck: Supple, no JVD, carotid bruits, or masses noted.  Cardiac: Regular rate and rhythm. Harsh outflow murmur. Trace edema.  Respiratory:  Lungs are coarse to auscultation bilaterally with normal work of breathing.  GI: Soft and nontender.  MS: No deformity or atrophy. Gait and ROM intact. He is using a walker Skin: Warm and dry. Color is normal.  Neuro:  Strength and sensation are intact and no gross focal deficits noted.  Psych: Alert, appropriate and with normal affect.   LABORATORY DATA:  EKG:  EKG is ordered today. This demonstrates NSR with PVCs noted. HR is 84 today.  Lab Results  Component Value Date   WBC 6.2 11/28/2016   HGB 13.7 11/28/2016   HCT 43.0 11/28/2016   PLT 166 11/28/2016   GLUCOSE 72 11/28/2016   CHOL 191 01/09/2016   TRIG 251 (H) 01/09/2016   HDL 56 01/09/2016   LDLCALC 85 01/09/2016   ALT 23 10/06/2016   AST 27 10/06/2016   NA 140 11/28/2016   K 4.8 11/28/2016   CL 106 11/28/2016   CREATININE 1.20 (H) 11/28/2016   BUN 26 (H) 11/28/2016   CO2 23 11/28/2016   TSH 1.501 01/09/2016   PSA 4.91 (H) 03/29/2015   PSA 5.07 (H) 03/29/2015   INR 1.1 11/28/2016   HGBA1C 6.1 (H) 08/15/2014    BNP (last 3 results) No results for input(s): BNP in the last 8760 hours.  ProBNP (last 3 results) No results for input(s): PROBNP in the last 8760 hours.   Other Studies Reviewed Today:  Procedures   Left Heart Cath and Cors/Grafts Angiography 11/2016  Conclusion     Ost Cx lesion,  100 %stenosed.    Severe native vessel coronary disease with total occlusion of the right coronary artery, 99% stenosis of the entire left main and ostial LAD, and total occlusion of the circumflex.  Patent saphenous vein graft to the circumflex/ramus, Patent saphenous vein graft to the PDA, and patent LIMA  to the LAD.  No gradient across the aortic valve. Elevated left ventricular end-diastolic pressure 26 mmHg.  Abnormal myocardial perfusion study likely represents myocardial infarction dating to pre-bypass surgery.  RECOMMENDATIONS:   Continue medical therapy  Angio-Seal deployed. If no difficulty with femoral access may be discharged later today.    Myoview 11/2016    Nuclear stress EF: 41%.  There was no ST segment deviation noted during stress.  There is a medium defect of moderate severity present in the basal inferior, mid inferior, apical lateral and apex location. The defect is non-reversible and consistent with prior scar of diaphragmatic attenuation artifact.  This is a high risk study.  The left ventricular ejection fraction is moderately decreased (30-44%).    ECHO 09/2016: Study Conclusions  - Left ventricle: The cavity size was moderately dilated. Systolic function was mildly to moderately reduced. The estimated ejection fraction was in the range of 40% to 45%. Diffuse hypokinesis. Features are consistent with a pseudonormal left ventricular filling pattern, with concomitant abnormal relaxation and increased filling pressure (grade 2 diastolic dysfunction). - Aortic valve: Moderately calcified annulus. Trileaflet; moderately thickened, moderately calcified leaflets. There was mild stenosis. Valve area (VTI): 1.14 cm^2. Valve area (Vmax): 1.18 cm^2. Valve area (Vmean): 1.08 cm^2. - Mitral valve: There was mild regurgitation.   ASSESSMENT AND PLAN:  1. Dyspnea - with known combined systolic and diastolic HF - adding low dose Lasix 20 mg - take 2 tablets for 3 days and then cut to just 20 mg a day. STOP NSAID. Lab today. Needs to restrict salt. Not on optimal CHF regimen. No beta blocker due to prior bradycardia. Not clear why he is not on ACE/ARB to me - may need to consider on return. BP is soft however.   2. Chronic combined  systolic and diastolic HF - see #1  3. Palpitations - does not really complain to me about this. PVCs noted on EKG. Not on beta blocker due to prior bradycardia.   4. Known CAD with CABG in 2004 - recent cath from 11/2016 noted - medically managed -   5. Bradycardia now off BB since prior to discharge 10/08/16.  Remains on Aricept. HR is ok today.   6. Dementia - on Aricept. Daughter now managing his medicines   Current medicines are reviewed with the patient today.  The patient does not have concerns regarding medicines other than what has been noted above.  The following changes have been made:  See above.  Labs/ tests ordered today include:    Orders Placed This Encounter  Procedures  . Basic metabolic panel  . CBC  . Pro b natriuretic peptide (BNP)  . Hepatic function panel  . EKG 12-Lead     Disposition:   FU with me in 2 weeks with lab on return.   Patient is agreeable to this plan and will call if any problems develop in the interim.   SignedTruitt Merle, NP  03/04/2017 9:05 AM  [facility] [address] [place], [place]  [address] Phone: [phone] Fax: [phone]

## 2017-03-04 NOTE — Telephone Encounter (Signed)
Pt's daughter, Sharyn Lull, called stating that pt just went to see his cardiologist, Dr Tamala Julian, who advised that the pt stop taking Aleve or Naproxen immediately. Daughter said Dr Redmond School mentioned the pt possibly taking Tramadol instead of Aleve at his last appointment anyway so daughter wants to know if Dr Redmond School can call in a script for Tramadol.

## 2017-03-04 NOTE — Telephone Encounter (Signed)
Left word for word message on jeanne VM

## 2017-03-04 NOTE — Telephone Encounter (Signed)
Let her know that I did call it in

## 2017-03-05 ENCOUNTER — Other Ambulatory Visit: Payer: Self-pay

## 2017-03-05 LAB — CBC
Hematocrit: 41.2 % (ref 37.5–51.0)
Hemoglobin: 13.5 g/dL (ref 13.0–17.7)
MCH: 29.9 pg (ref 26.6–33.0)
MCHC: 32.8 g/dL (ref 31.5–35.7)
MCV: 91 fL (ref 79–97)
Platelets: 128 10*3/uL — ABNORMAL LOW (ref 150–379)
RBC: 4.52 x10E6/uL (ref 4.14–5.80)
RDW: 14.9 % (ref 12.3–15.4)
WBC: 7.1 10*3/uL (ref 3.4–10.8)

## 2017-03-05 LAB — BASIC METABOLIC PANEL
BUN/Creatinine Ratio: 24 (ref 10–24)
BUN: 28 mg/dL — ABNORMAL HIGH (ref 8–27)
CO2: 22 mmol/L (ref 18–29)
Calcium: 9.7 mg/dL (ref 8.6–10.2)
Chloride: 105 mmol/L (ref 96–106)
Creatinine, Ser: 1.18 mg/dL (ref 0.76–1.27)
GFR calc Af Amer: 68 mL/min/{1.73_m2} (ref >59)
GFR calc non Af Amer: 59 mL/min/{1.73_m2} — ABNORMAL LOW (ref >59)
Glucose: 124 mg/dL — ABNORMAL HIGH (ref 65–99)
Potassium: 4.5 mmol/L (ref 3.5–5.2)
Sodium: 143 mmol/L (ref 134–144)

## 2017-03-05 LAB — HEPATIC FUNCTION PANEL
ALT: 15 IU/L (ref 0–44)
AST: 20 IU/L (ref 0–40)
Albumin: 3.9 g/dL (ref 3.5–4.8)
Alkaline Phosphatase: 57 IU/L (ref 39–117)
Bilirubin Total: 0.6 mg/dL (ref 0.0–1.2)
Bilirubin, Direct: 0.21 mg/dL (ref 0.00–0.40)
Total Protein: 6.2 g/dL (ref 6.0–8.5)

## 2017-03-05 LAB — PRO B NATRIURETIC PEPTIDE: NT-Pro BNP: 5228 pg/mL — ABNORMAL HIGH (ref 0–486)

## 2017-03-06 DIAGNOSIS — R296 Repeated falls: Secondary | ICD-10-CM | POA: Diagnosis not present

## 2017-03-06 DIAGNOSIS — G250 Essential tremor: Secondary | ICD-10-CM | POA: Diagnosis not present

## 2017-03-06 NOTE — Progress Notes (Signed)
Noted  

## 2017-03-11 DIAGNOSIS — G250 Essential tremor: Secondary | ICD-10-CM | POA: Diagnosis not present

## 2017-03-11 DIAGNOSIS — R296 Repeated falls: Secondary | ICD-10-CM | POA: Diagnosis not present

## 2017-03-13 DIAGNOSIS — G250 Essential tremor: Secondary | ICD-10-CM | POA: Diagnosis not present

## 2017-03-13 DIAGNOSIS — R296 Repeated falls: Secondary | ICD-10-CM | POA: Diagnosis not present

## 2017-03-16 ENCOUNTER — Other Ambulatory Visit: Payer: Self-pay | Admitting: Neurology

## 2017-03-18 DIAGNOSIS — G250 Essential tremor: Secondary | ICD-10-CM | POA: Diagnosis not present

## 2017-03-18 DIAGNOSIS — R296 Repeated falls: Secondary | ICD-10-CM | POA: Diagnosis not present

## 2017-03-19 ENCOUNTER — Telehealth: Payer: Self-pay

## 2017-03-19 NOTE — Telephone Encounter (Signed)
Called and spoke with kate gave  The ok and she will sent the therapists out to his home

## 2017-03-19 NOTE — Telephone Encounter (Signed)
[facility] nurse [name] called wanted to get okay from you for skilled nursing ocp. ptand speech to help patient with diet and how to order when he cant cook for himself please advise

## 2017-03-19 NOTE — Telephone Encounter (Signed)
ok 

## 2017-03-21 DIAGNOSIS — G250 Essential tremor: Secondary | ICD-10-CM | POA: Diagnosis not present

## 2017-03-21 DIAGNOSIS — R296 Repeated falls: Secondary | ICD-10-CM | POA: Diagnosis not present

## 2017-03-23 ENCOUNTER — Ambulatory Visit (INDEPENDENT_AMBULATORY_CARE_PROVIDER_SITE_OTHER): Payer: Medicare Other | Admitting: Nurse Practitioner

## 2017-03-23 ENCOUNTER — Encounter: Payer: Self-pay | Admitting: Nurse Practitioner

## 2017-03-23 VITALS — BP 110/80 | HR 72 | Ht 69.0 in | Wt 199.1 lb

## 2017-03-23 DIAGNOSIS — I2510 Atherosclerotic heart disease of native coronary artery without angina pectoris: Secondary | ICD-10-CM | POA: Diagnosis not present

## 2017-03-23 DIAGNOSIS — I5042 Chronic combined systolic (congestive) and diastolic (congestive) heart failure: Secondary | ICD-10-CM | POA: Diagnosis not present

## 2017-03-23 DIAGNOSIS — I480 Paroxysmal atrial fibrillation: Secondary | ICD-10-CM | POA: Diagnosis not present

## 2017-03-23 DIAGNOSIS — G250 Essential tremor: Secondary | ICD-10-CM | POA: Diagnosis not present

## 2017-03-23 DIAGNOSIS — R296 Repeated falls: Secondary | ICD-10-CM | POA: Diagnosis not present

## 2017-03-23 DIAGNOSIS — R0602 Shortness of breath: Secondary | ICD-10-CM | POA: Diagnosis not present

## 2017-03-23 NOTE — Patient Instructions (Addendum)
We will be checking the following labs today - BMET and BNP   Medication Instructions:    Continue with your current medicines.     Testing/Procedures To Be Arranged:  N/A  Follow-Up:   See me in 3 months    Other Special Instructions:   Keep restricting your salt.     If you need a refill on your cardiac medications before your next appointment, please call your pharmacy.   Call the [facility] office at [phone] if you have any questions, problems or concerns.

## 2017-03-23 NOTE — Progress Notes (Signed)
CARDIOLOGY OFFICE NOTE  Date:  03/23/2017    Aaron Berg Date of Birth: June 27, 1936 Medical Record #[id number]  PCP:  Wyatt Haste, MD  Cardiologist:  Jennings Books    Chief Complaint  Patient presents with  . Congestive Heart Failure    Follow up visit - seen for Dr. Tamala Julian    History of Present Illness: Aaron Berg is a 81 y.o. male who presents today for a follow up visit. Approximate 3 week check. Seen for Dr. Tamala Julian.   He has a history of CAD s/p CABG 2003, prior cath 2012 with patent LIMA to LAD, patent SVG to RCA and 50% ostial stenosis of SVG to LCX. EF at that time 35%. He has chronic combined CHF, mild aortic stenosis/mitral regurg by echo 09/2016, HTN, HLD, paroxysmal atrial fib on Eliquis, OSA on CPAP, COPD, microvascular dementia, CVA, and falls.   Seen back in December for evaluation of irregular HR noted by [facility]. Per phone notes the patient was noted to have HR in the upper 40s and low 50s with irregularly felt. Recent adm 09/2016 for chest pain, atypical, ruled out for MI. During that say he was noted to maintain NSR with PVCs with HR 40s-50s, the bradycardia prompting reduction in beta blocker therapy and ultimate discontinuation given HR 41. 2D echo 10/08/16: EF 40-45%, grade 2 DD, mild aortic stenosis, mild mitral regurgitation. Outpatient nuc was recommended. This turned out to be high risk. Ended up getting recathed - see below - continued medical management recommended.   I saw him about 3 weeks ago - had called with multiple complaints - getting way too much salt. Diuretics were adjusted. Not on beta blocker due to bradycardia. Not clear why not on ACE/ARB other than soft BP.   Comes in today. Here with his daughter. His weight is down. Less shortness of breath. Feels a little weak. Daughter has been gone for the past 2 weeks on a trip to [place]. He has now been going down to the dining room for his meals - this has really helped him get lets  salt. Not eating salt and no more soup. No swelling.   Past Medical History:  Diagnosis Date  . Aortic stenosis   . Arthritis    "thumbs, wrists, legs" (10/06/2016)  . Atrial flutter ([place])   . CAD (coronary artery disease)    a. s/p CABG 2003.  [name] Cerebrovascular disease 2006   multiple ischemic changes on prior MRI  . Chronic combined systolic and diastolic CHF (congestive heart failure) ([place])   . Chronic lower back pain   . Complication of anesthesia    "woke up too soon"  . COPD (chronic obstructive pulmonary disease) ([place])    "no signs or tests"  . CVA (cerebral vascular accident) ([place])   . Dementia   . Depression   . Dry eyes   . Dyslipidemia    well controlled  . Gait disorder   . Heart murmur    "found in ~ 1946"  . Hypertension   . Hypertensive cardiovascular disease   . Male hypogonadism   . Migraine    "major one when I was a kid; very painful"  . Mitral regurgitation   . Multiple falls    "after both knees surgeries; went to rehab; came out and fell all the time" (10/06/2016)  . Obesity   . OSA on CPAP    cpap  . Paroxysmal atrial fibrillation (HCC)   . Skin  cancer of anterior chest   . Spinal stenosis     Past Surgical History:  Procedure Laterality Date  . APPENDECTOMY  as child  . CARDIAC CATHETERIZATION  11/2003   Archie Endo 04/29/2011  . CARDIAC CATHETERIZATION N/A 12/03/2016   Procedure: Left Heart Cath and Cors/Grafts Angiography;  Surgeon: Belva Crome, MD;  Location: [facility];  Service: Cardiovascular;  Laterality: N/A;  . CARDIOVERSION N/A 04/26/2014   Procedure: CARDIOVERSION;  Surgeon: Sinclair Grooms, MD;  Location: [facility] ENDOSCOPY;  Service: Cardiovascular;  Laterality: N/A;  . CARDIOVERSION N/A 05/24/2014   Procedure: CARDIOVERSION;  Surgeon: Sinclair Grooms, MD;  Location: [facility];  Service: Cardiovascular;  Laterality: N/A;  . CATARACT EXTRACTION W/ INTRAOCULAR LENS  IMPLANT, BILATERAL Bilateral 2000s  . CORONARY ANGIOPLASTY   1991   "no stents" (10/06/2016)  . CORONARY ARTERY BYPASS GRAFT  11/2002   X 3/notes 04/29/2011  . CYST EXCISION     lumbar; Dr. Joya Salm  . EYE SURGERY Right    detached retina  . JOINT REPLACEMENT    . MOHS SURGERY     on chest  . RETINAL DETACHMENT SURGERY Right   . SHOULDER SURGERY Right    Right with murphy, reattach ligaments  . TONSILLECTOMY  as child  . TOTAL KNEE ARTHROPLASTY Right 01/02/2014   Procedure: RIGHT TOTAL KNEE ARTHROPLASTY;  Surgeon: Mauri Pole, MD;  Location: [facility];  Service: Orthopedics;  Laterality: Right;  . TOTAL KNEE ARTHROPLASTY Left 01/31/2014   Procedure: LEFT TOTAL KNEE ARTHROPLASTY;  Surgeon: Mauri Pole, MD;  Location: [facility];  Service: Orthopedics;  Laterality: Left;  . TOTAL SHOULDER ARTHROPLASTY Right   . VIDEO ASSISTED THORACOSCOPY (VATS)/THOROCOTOMY  2003   benign nodule     Medications: Current Outpatient Prescriptions  Medication Sig Dispense Refill  . apixaban (ELIQUIS) 5 MG TABS tablet Take 1 tablet (5 mg total) by mouth 2 (two) times daily. 180 tablet 3  . Budesonide (RHINOCORT ALLERGY NA) Place 1 spray into the nose daily.    [name] buPROPion (WELLBUTRIN SR) 150 MG 12 hr tablet TAKE 1 TABLET TWICE A DAY 180 tablet 0  . cetirizine (ZYRTEC) 10 MG tablet Take 10 mg by mouth daily.    . cholecalciferol (VITAMIN D) 1000 UNITS tablet Take 1,000 Units by mouth daily.     [name] donepezil (ARICEPT) 23 MG TABS tablet TAKE 1 TABLET AT BEDTIME 90 tablet 1  . feeding supplement, ENSURE ENLIVE, (ENSURE ENLIVE) LIQD Take 237 mLs by mouth 2 (two) times daily between meals. 237 mL 12  . fexofenadine (ALLEGRA) 180 MG tablet Take 180 mg by mouth daily.    . finasteride (PROSCAR) 5 MG tablet Take 1 tablet (5 mg total) by mouth daily. 90 tablet 3  . FLUoxetine (PROZAC) 20 MG tablet Take 1 tablet (20 mg total) by mouth daily. 30 tablet 3  . folic acid (FOLVITE) 1 MG tablet Take 1 tablet (1 mg total) by mouth daily. 30 tablet 1  . furosemide (LASIX) 20 MG tablet  Take 20 mg by mouth daily.    . Multiple Vitamins-Minerals (MULTIVITAMIN WITH MINERALS) tablet Take 1 tablet by mouth daily. Reported on 01/30/2016    . naproxen sodium (ANAPROX) 550 MG tablet Take 550 mg by mouth 2 (two) times daily as needed for mild pain.    . nitroGLYCERIN (NITROSTAT) 0.4 MG SL tablet Place 1 tablet (0.4 mg total) under the tongue every 5 (five) minutes as needed for chest pain. 25  tablet 12  . rosuvastatin (CRESTOR) 40 MG tablet TAKE 1 TABLET DAILY WITH BREAKFAST FOR HYPERLIPIDEMIA (KEEP UPCOMING APPOINTMENT IN ORDER TO RECEIVE ADDITIONAL REFILLS) 90 tablet 3  . sodium chloride (OCEAN) 0.65 % SOLN nasal spray Place 1 spray into both nostrils daily as needed for congestion.     . Testosterone 20.25 MG/1.25GM (1.62%) GEL Apply 2 Squirts topically daily as needed (occasionally). One pump per shoulder.    . traMADol (ULTRAM) 50 MG tablet Take 1 tablet (50 mg total) by mouth every 8 (eight) hours as needed. 50 tablet 1   No current facility-administered medications for this visit.     Allergies: Allergies  Allergen Reactions  . Procardia [Nifedipine] Rash    All over body    Social History: The patient  reports that he quit smoking about 27 years ago. His smoking use included Cigarettes. He has a 66.00 pack-year smoking history. He has never used smokeless tobacco. He reports that he drinks about 8.4 oz of alcohol per week . He reports that he does not use drugs.   Family History: The patient's family history includes Arthritis in his sister; CAD in his father and mother; Healthy in his sister; Pancreatic cancer in his father.   Review of Systems: Please see the history of present illness.   Otherwise, the review of systems is positive for none.   All other systems are reviewed and negative.   Physical Exam: VS:  BP 110/80   Pulse 72   Ht 5\' 9"  (1.753 m)   Wt 199 lb 1.9 oz (90.3 kg)   SpO2 98% Comment: at rest  BMI 29.40 kg/m  .  BMI Body mass index is 29.4  kg/m.  Wt Readings from Last 3 Encounters:  03/23/17 199 lb 1.9 oz (90.3 kg)  03/04/17 212 lb (96.2 kg)  02/26/17 211 lb (95.7 kg)    General: Elderly male who is alert and in no acute distress.   HEENT: Normal.  Neck: Supple, no JVD, carotid bruits, or masses noted.  Cardiac: Regular rate and rhythm. Harsh outflow murmur. No edema.  Respiratory:  Lungs are clear to auscultation bilaterally with normal work of breathing.  GI: Soft and nontender.  MS: No deformity or atrophy. Gait and ROM intact. He is using a walker.   Skin: Warm and dry. Color is normal.  Neuro:  Strength and sensation are intact and no gross focal deficits noted.  Psych: Alert, appropriate and with normal affect.   LABORATORY DATA:  EKG:  EKG is not ordered today.  Lab Results  Component Value Date   WBC 7.1 03/04/2017   HGB 13.7 11/28/2016   HCT 41.2 03/04/2017   PLT 128 (L) 03/04/2017   GLUCOSE 124 (H) 03/04/2017   CHOL 191 01/09/2016   TRIG 251 (H) 01/09/2016   HDL 56 01/09/2016   LDLCALC 85 01/09/2016   ALT 15 03/04/2017   AST 20 03/04/2017   NA 143 03/04/2017   K 4.5 03/04/2017   CL 105 03/04/2017   CREATININE 1.18 03/04/2017   BUN 28 (H) 03/04/2017   CO2 22 03/04/2017   TSH 1.501 01/09/2016   PSA 4.91 (H) 03/29/2015   PSA 5.07 (H) 03/29/2015   INR 1.1 11/28/2016   HGBA1C 6.1 (H) 08/15/2014    BNP (last 3 results) No results for input(s): BNP in the last 8760 hours.  ProBNP (last 3 results)  Recent Labs  03/04/17 0924  PROBNP 5,228*     Other Studies Reviewed Today:  Procedures   Left Heart Cath and Cors/Grafts Angiography 11/2016  Conclusion     Ost Cx lesion, 100 %stenosed.   Severe native vessel coronary disease with total occlusion of the right coronary artery, 99% stenosis of the entire left main and ostial LAD, and total occlusion of the circumflex.  Patent saphenous vein graft to the circumflex/ramus, Patent saphenous vein graft to the PDA, and patent LIMA  to the LAD.  No gradient across the aortic valve. Elevated left ventricular end-diastolic pressure 26 mmHg.  Abnormal myocardial perfusion study likely represents myocardial infarction dating to pre-bypass surgery.  RECOMMENDATIONS:   Continue medical therapy  Angio-Seal deployed. If no difficulty with femoral access may be discharged later today.    Myoview 11/2016    Nuclear stress EF: 41%.  There was no ST segment deviation noted during stress.  There is a medium defect of moderate severity present in the basal inferior, mid inferior, apical lateral and apex location. The defect is non-reversible and consistent with prior scar of diaphragmatic attenuation artifact.  This is a high risk study.  The left ventricular ejection fraction is moderately decreased (30-44%).    ECHO 09/2016: Study Conclusions  - Left ventricle: The cavity size was moderately dilated. Systolic function was mildly to moderately reduced. The estimated ejection fraction was in the range of 40% to 45%. Diffuse hypokinesis. Features are consistent with a pseudonormal left ventricular filling pattern, with concomitant abnormal relaxation and increased filling pressure (grade 2 diastolic dysfunction). - Aortic valve: Moderately calcified annulus. Trileaflet; moderately thickened, moderately calcified leaflets. There was mild stenosis. Valve area (VTI): 1.14 cm^2. Valve area (Vmax): 1.18 cm^2. Valve area (Vmean): 1.08 cm^2. - Mitral valve: There was mild regurgitation.   ASSESSMENT AND PLAN:  1. Dyspnea - with known combined systolic and diastolic HF - now on low dose diuretic. Using less salt. BP still pretty soft - would hold on ACE/ARB.   2. Chronic combined systolic and diastolic HF - see #1  3. Palpitations - does not really complain to me about this. PVCs noted on prior EKG. Not on beta blocker due to prior bradycardia.   4. Known CAD with CABG in 2004 -  recent cath from 11/2016 noted - medically managed - no symptoms noted.   5. Bradycardia now off BB since prior to discharge 10/08/16.  Remains on Aricept however. HR is ok today.   6. Dementia - on Aricept. Daughter now managing his medicines   Current medicines are reviewed with the patient today.  The patient does not have concerns regarding medicines other than what has been noted above.  The following changes have been made:  See above.  Labs/ tests ordered today include:    Orders Placed This Encounter  Procedures  . Basic metabolic panel  . Pro b natriuretic peptide (BNP)     Disposition:   FU with me in 3 months. Family would like to follow with me going forward.   Patient is agreeable to this plan and will call if any problems develop in the interim.   SignedTruitt Merle, NP  03/23/2017 2:50 PM  [facility] [address] [place], [place]  [address] Phone: [phone] Fax: [phone]

## 2017-03-24 DIAGNOSIS — H35372 Puckering of macula, left eye: Secondary | ICD-10-CM | POA: Diagnosis not present

## 2017-03-24 DIAGNOSIS — G250 Essential tremor: Secondary | ICD-10-CM | POA: Diagnosis not present

## 2017-03-24 DIAGNOSIS — H43392 Other vitreous opacities, left eye: Secondary | ICD-10-CM | POA: Diagnosis not present

## 2017-03-24 DIAGNOSIS — R296 Repeated falls: Secondary | ICD-10-CM | POA: Diagnosis not present

## 2017-03-24 DIAGNOSIS — H43812 Vitreous degeneration, left eye: Secondary | ICD-10-CM | POA: Diagnosis not present

## 2017-03-24 LAB — BASIC METABOLIC PANEL
BUN/Creatinine Ratio: 18 (ref 10–24)
BUN: 23 mg/dL (ref 8–27)
CO2: 24 mmol/L (ref 18–29)
Calcium: 9.6 mg/dL (ref 8.6–10.2)
Chloride: 101 mmol/L (ref 96–106)
Creatinine, Ser: 1.26 mg/dL (ref 0.76–1.27)
GFR calc Af Amer: 63 mL/min/{1.73_m2} (ref >59)
GFR calc non Af Amer: 54 mL/min/{1.73_m2} — ABNORMAL LOW (ref >59)
Glucose: 87 mg/dL (ref 65–99)
Potassium: 4.2 mmol/L (ref 3.5–5.2)
Sodium: 141 mmol/L (ref 134–144)

## 2017-03-24 LAB — PRO B NATRIURETIC PEPTIDE: NT-Pro BNP: 4260 pg/mL — ABNORMAL HIGH (ref 0–486)

## 2017-03-24 NOTE — Telephone Encounter (Signed)
Pt needs a refill on fluoxetine 20mg  # [facility]  [address]

## 2017-03-25 ENCOUNTER — Telehealth: Payer: Self-pay

## 2017-03-25 ENCOUNTER — Encounter: Payer: Self-pay | Admitting: Nurse Practitioner

## 2017-03-25 DIAGNOSIS — G250 Essential tremor: Secondary | ICD-10-CM | POA: Diagnosis not present

## 2017-03-25 DIAGNOSIS — R296 Repeated falls: Secondary | ICD-10-CM | POA: Diagnosis not present

## 2017-03-25 NOTE — Telephone Encounter (Signed)
Ena Dawley, RN nurse with [facility] called to let you know that pt is having issues with urination 3 x at night (request flomax for this), depression couple of weeks ago. She questions if he needs to be increased on his depression medications. She also questions if pt needs to be on potassium with lasix. CB # [id number]. ///RLB

## 2017-03-25 NOTE — Telephone Encounter (Signed)
He needs an office visit.  

## 2017-03-25 NOTE — Telephone Encounter (Signed)
Needs refill of fluoxentine 90 day supply called to [facility] [address]. Aaron Berg

## 2017-03-25 NOTE — Telephone Encounter (Signed)
Pt has appointment 4/18

## 2017-03-30 DIAGNOSIS — R296 Repeated falls: Secondary | ICD-10-CM | POA: Diagnosis not present

## 2017-03-30 DIAGNOSIS — G250 Essential tremor: Secondary | ICD-10-CM | POA: Diagnosis not present

## 2017-03-31 DIAGNOSIS — R296 Repeated falls: Secondary | ICD-10-CM | POA: Diagnosis not present

## 2017-03-31 DIAGNOSIS — G250 Essential tremor: Secondary | ICD-10-CM | POA: Diagnosis not present

## 2017-04-01 DIAGNOSIS — R296 Repeated falls: Secondary | ICD-10-CM | POA: Diagnosis not present

## 2017-04-01 DIAGNOSIS — G250 Essential tremor: Secondary | ICD-10-CM | POA: Diagnosis not present

## 2017-04-02 ENCOUNTER — Ambulatory Visit: Payer: Self-pay | Admitting: Family Medicine

## 2017-04-02 DIAGNOSIS — G250 Essential tremor: Secondary | ICD-10-CM | POA: Diagnosis not present

## 2017-04-02 DIAGNOSIS — R296 Repeated falls: Secondary | ICD-10-CM | POA: Diagnosis not present

## 2017-04-07 DIAGNOSIS — G250 Essential tremor: Secondary | ICD-10-CM | POA: Diagnosis not present

## 2017-04-07 DIAGNOSIS — R296 Repeated falls: Secondary | ICD-10-CM | POA: Diagnosis not present

## 2017-04-08 DIAGNOSIS — G250 Essential tremor: Secondary | ICD-10-CM | POA: Diagnosis not present

## 2017-04-08 DIAGNOSIS — R296 Repeated falls: Secondary | ICD-10-CM | POA: Diagnosis not present

## 2017-04-09 DIAGNOSIS — G250 Essential tremor: Secondary | ICD-10-CM | POA: Diagnosis not present

## 2017-04-09 DIAGNOSIS — R296 Repeated falls: Secondary | ICD-10-CM | POA: Diagnosis not present

## 2017-04-13 DIAGNOSIS — R296 Repeated falls: Secondary | ICD-10-CM | POA: Diagnosis not present

## 2017-04-13 DIAGNOSIS — G250 Essential tremor: Secondary | ICD-10-CM | POA: Diagnosis not present

## 2017-04-14 ENCOUNTER — Encounter: Payer: Self-pay | Admitting: Family Medicine

## 2017-04-14 ENCOUNTER — Other Ambulatory Visit: Payer: Self-pay

## 2017-04-14 ENCOUNTER — Ambulatory Visit (INDEPENDENT_AMBULATORY_CARE_PROVIDER_SITE_OTHER): Payer: Medicare Other | Admitting: Family Medicine

## 2017-04-14 VITALS — BP 90/50 | HR 45 | Ht 69.0 in | Wt 196.0 lb

## 2017-04-14 DIAGNOSIS — R296 Repeated falls: Secondary | ICD-10-CM

## 2017-04-14 DIAGNOSIS — G8929 Other chronic pain: Secondary | ICD-10-CM

## 2017-04-14 DIAGNOSIS — I2510 Atherosclerotic heart disease of native coronary artery without angina pectoris: Secondary | ICD-10-CM

## 2017-04-14 DIAGNOSIS — H9113 Presbycusis, bilateral: Secondary | ICD-10-CM

## 2017-04-14 DIAGNOSIS — M545 Low back pain: Secondary | ICD-10-CM | POA: Diagnosis not present

## 2017-04-14 DIAGNOSIS — J309 Allergic rhinitis, unspecified: Secondary | ICD-10-CM

## 2017-04-14 DIAGNOSIS — F329 Major depressive disorder, single episode, unspecified: Secondary | ICD-10-CM

## 2017-04-14 MED ORDER — DULOXETINE HCL 30 MG PO CPEP: 30.0000 mg | ORAL_CAPSULE | Freq: Every day | ORAL | 3 refills | Status: DC

## 2017-04-14 MED ORDER — TRAMADOL HCL 50 MG PO TABS: 50.0000 mg | ORAL_TABLET | Freq: Three times a day (TID) | ORAL | 1 refills | Status: DC | PRN

## 2017-04-14 NOTE — Patient Instructions (Addendum)
Stop the Prozac and then also cut down to 1 Wellbutrin per day for the next week and then stop it. Start on the Cymbalta when you finish the Wellbutrin Use tramadol twice a day Get back on the Rhinocort to see if that'll help. Continue Zyrtec

## 2017-04-14 NOTE — Progress Notes (Signed)
   Subjective:    Patient ID: Aaron Berg, male    DOB: [birth year], 81 y.o.   MRN: [id number]  HPI He is here for a recheck. He would like to change his medications. He still having a lot of difficulty with back and hip pain. He has a history of spinal stenosis and has had epidurals that have not been successful. This is causing the feels if he is slipping. He has not had any falls but has been doing well with physical therapy and is using a walker. He has not had any recent falls. He does wake up with pain and does get some relief with tramadol and as the day goes on he seems to do better but does occasionally need more pain meds. He also complained of hearing difficulties. He does have bilateral hearing aids but is sometimes hearing an echo. He does have underlying allergies. He is using Rhinocort as well as Zyrtec. His other medications were reviewed. He doesn't feel as his depression is doing as well as it should be.   Review of Systems     Objective:   Physical Exam Alert and in no distress. Tympanic membranes and canals are normal. Pharyngeal area is normal. Neck is supple without adenopathy or thyromegaly. Cardiac exam shows a regular sinus rhythm without murmurs or gallops. Lungs are clear to auscultation.       Assessment & Plan:  Multiple falls  Allergic rhinitis, unspecified seasonality, unspecified trigger  Chronic low back pain without sciatica, unspecified back pain laterality  Presbycusis of both ears  Major depressive disorder with single episode, remission status unspecified Stop the Prozac and then also cut down to 1 Wellbutrin per day for the next week and then stop it. Start on the Cymbalta when you finish the Wellbutrin Use tramadol twice a day Get back on the Rhinocort to see if that'll help. Continue Zyrtec Hopefully the Cymbalta will help with both depression and with his pain. He is also to judiciously use the tramadol since it does seem to help with his  pain. He is really not a good surgical candidate for any back procedure. He is aware of this and is also not interested. He is to return here in a proximally 5 or 6 weeks.

## 2017-04-15 DIAGNOSIS — G250 Essential tremor: Secondary | ICD-10-CM | POA: Diagnosis not present

## 2017-04-15 DIAGNOSIS — R296 Repeated falls: Secondary | ICD-10-CM | POA: Diagnosis not present

## 2017-04-16 DIAGNOSIS — R296 Repeated falls: Secondary | ICD-10-CM | POA: Diagnosis not present

## 2017-04-16 DIAGNOSIS — G250 Essential tremor: Secondary | ICD-10-CM | POA: Diagnosis not present

## 2017-04-18 DIAGNOSIS — R296 Repeated falls: Secondary | ICD-10-CM | POA: Diagnosis not present

## 2017-04-18 DIAGNOSIS — G250 Essential tremor: Secondary | ICD-10-CM | POA: Diagnosis not present

## 2017-04-19 DIAGNOSIS — R296 Repeated falls: Secondary | ICD-10-CM | POA: Diagnosis not present

## 2017-04-19 DIAGNOSIS — F329 Major depressive disorder, single episode, unspecified: Secondary | ICD-10-CM | POA: Diagnosis not present

## 2017-04-20 DIAGNOSIS — R296 Repeated falls: Secondary | ICD-10-CM | POA: Diagnosis not present

## 2017-04-20 DIAGNOSIS — F329 Major depressive disorder, single episode, unspecified: Secondary | ICD-10-CM | POA: Diagnosis not present

## 2017-04-22 DIAGNOSIS — R296 Repeated falls: Secondary | ICD-10-CM | POA: Diagnosis not present

## 2017-04-22 DIAGNOSIS — F329 Major depressive disorder, single episode, unspecified: Secondary | ICD-10-CM | POA: Diagnosis not present

## 2017-04-24 DIAGNOSIS — R296 Repeated falls: Secondary | ICD-10-CM | POA: Diagnosis not present

## 2017-04-24 DIAGNOSIS — F329 Major depressive disorder, single episode, unspecified: Secondary | ICD-10-CM | POA: Diagnosis not present

## 2017-04-27 DIAGNOSIS — F329 Major depressive disorder, single episode, unspecified: Secondary | ICD-10-CM | POA: Diagnosis not present

## 2017-04-27 DIAGNOSIS — R296 Repeated falls: Secondary | ICD-10-CM | POA: Diagnosis not present

## 2017-04-29 ENCOUNTER — Telehealth: Payer: Self-pay | Admitting: *Deleted

## 2017-04-29 DIAGNOSIS — F329 Major depressive disorder, single episode, unspecified: Secondary | ICD-10-CM | POA: Diagnosis not present

## 2017-04-29 DIAGNOSIS — R296 Repeated falls: Secondary | ICD-10-CM | POA: Diagnosis not present

## 2017-04-29 NOTE — Telephone Encounter (Signed)
S/w pt moved pt's appointment due to conflict on Lori's schedule.

## 2017-05-04 DIAGNOSIS — R296 Repeated falls: Secondary | ICD-10-CM | POA: Diagnosis not present

## 2017-05-04 DIAGNOSIS — F329 Major depressive disorder, single episode, unspecified: Secondary | ICD-10-CM | POA: Diagnosis not present

## 2017-05-05 ENCOUNTER — Telehealth: Payer: Self-pay

## 2017-05-05 DIAGNOSIS — R296 Repeated falls: Secondary | ICD-10-CM | POA: Diagnosis not present

## 2017-05-05 DIAGNOSIS — F329 Major depressive disorder, single episode, unspecified: Secondary | ICD-10-CM | POA: Diagnosis not present

## 2017-05-05 NOTE — Telephone Encounter (Signed)
Ena Dawley- RN with [facility] called to let you know pt has had wt gain of 8 lbs since Thursday. Lungs clear, and no SHOB. Nurse reports he is a little swollen in ABD but reports constipation. No other symptoms. [name] [name]

## 2017-05-05 NOTE — Telephone Encounter (Signed)
:   Nurse and have her check back with [place] tomorrow concerning the weight.

## 2017-05-06 NOTE — Telephone Encounter (Signed)
Spoke with pt-he reports wt this am was 200.6 lbs, wt yesterday was 198.6 lbs. Wt last week was 192.4 lbs. Pt denies SHOB. Pt is avoiding foods with sodium and drinking 6-8 glasses of water flavored with crystal light a day.   Please advise on any recommendations. Aaron Berg

## 2017-05-06 NOTE — Telephone Encounter (Signed)
LM for Ena Dawley to CB. [name]

## 2017-05-06 NOTE — Telephone Encounter (Signed)
They need to be following up with cardiology concerning this.

## 2017-05-06 NOTE — Telephone Encounter (Signed)
Pt called with update to say his wt this am is actually 206.8 lbs.

## 2017-05-06 NOTE — Telephone Encounter (Signed)
Ena Dawley informed to get in contact with Cardiologist

## 2017-05-07 DIAGNOSIS — R296 Repeated falls: Secondary | ICD-10-CM | POA: Diagnosis not present

## 2017-05-07 DIAGNOSIS — F329 Major depressive disorder, single episode, unspecified: Secondary | ICD-10-CM | POA: Diagnosis not present

## 2017-05-12 ENCOUNTER — Ambulatory Visit: Payer: Medicare Other | Admitting: Family Medicine

## 2017-05-12 ENCOUNTER — Ambulatory Visit (INDEPENDENT_AMBULATORY_CARE_PROVIDER_SITE_OTHER): Payer: Medicare Other | Admitting: Nurse Practitioner

## 2017-05-12 ENCOUNTER — Encounter (HOSPITAL_COMMUNITY): Payer: Self-pay | Admitting: General Practice

## 2017-05-12 ENCOUNTER — Telehealth: Payer: Self-pay | Admitting: *Deleted

## 2017-05-12 ENCOUNTER — Inpatient Hospital Stay (HOSPITAL_COMMUNITY)
Admission: AD | Admit: 2017-05-12 | Discharge: 2017-05-15 | DRG: 292 | Disposition: A | Discharge status: Home Health | Payer: Medicare Other | Source: Ambulatory Visit | Attending: Interventional Cardiology | Admitting: Interventional Cardiology

## 2017-05-12 ENCOUNTER — Encounter: Payer: Self-pay | Admitting: Nurse Practitioner

## 2017-05-12 VITALS — BP 96/76 | HR 101 | Ht 70.0 in | Wt 202.4 lb

## 2017-05-12 DIAGNOSIS — G4733 Obstructive sleep apnea (adult) (pediatric): Secondary | ICD-10-CM | POA: Diagnosis present

## 2017-05-12 DIAGNOSIS — R351 Nocturia: Secondary | ICD-10-CM | POA: Diagnosis present

## 2017-05-12 DIAGNOSIS — F329 Major depressive disorder, single episode, unspecified: Secondary | ICD-10-CM | POA: Diagnosis not present

## 2017-05-12 DIAGNOSIS — R0600 Dyspnea, unspecified: Secondary | ICD-10-CM

## 2017-05-12 DIAGNOSIS — G8929 Other chronic pain: Secondary | ICD-10-CM | POA: Diagnosis present

## 2017-05-12 DIAGNOSIS — I472 Ventricular tachycardia: Secondary | ICD-10-CM | POA: Diagnosis not present

## 2017-05-12 DIAGNOSIS — I493 Ventricular premature depolarization: Secondary | ICD-10-CM | POA: Diagnosis not present

## 2017-05-12 DIAGNOSIS — I480 Paroxysmal atrial fibrillation: Secondary | ICD-10-CM | POA: Diagnosis not present

## 2017-05-12 DIAGNOSIS — Z7901 Long term (current) use of anticoagulants: Secondary | ICD-10-CM

## 2017-05-12 DIAGNOSIS — I5042 Chronic combined systolic (congestive) and diastolic (congestive) heart failure: Secondary | ICD-10-CM

## 2017-05-12 DIAGNOSIS — E785 Hyperlipidemia, unspecified: Secondary | ICD-10-CM | POA: Diagnosis present

## 2017-05-12 DIAGNOSIS — M4800 Spinal stenosis, site unspecified: Secondary | ICD-10-CM | POA: Diagnosis present

## 2017-05-12 DIAGNOSIS — I509 Heart failure, unspecified: Secondary | ICD-10-CM

## 2017-05-12 DIAGNOSIS — Z87891 Personal history of nicotine dependence: Secondary | ICD-10-CM

## 2017-05-12 DIAGNOSIS — I5031 Acute diastolic (congestive) heart failure: Secondary | ICD-10-CM

## 2017-05-12 DIAGNOSIS — I5043 Acute on chronic combined systolic (congestive) and diastolic (congestive) heart failure: Secondary | ICD-10-CM | POA: Diagnosis present

## 2017-05-12 DIAGNOSIS — I4892 Unspecified atrial flutter: Secondary | ICD-10-CM | POA: Diagnosis present

## 2017-05-12 DIAGNOSIS — Z96612 Presence of left artificial shoulder joint: Secondary | ICD-10-CM | POA: Diagnosis present

## 2017-05-12 DIAGNOSIS — Z8673 Personal history of transient ischemic attack (TIA), and cerebral infarction without residual deficits: Secondary | ICD-10-CM

## 2017-05-12 DIAGNOSIS — F0390 Unspecified dementia without behavioral disturbance: Secondary | ICD-10-CM

## 2017-05-12 DIAGNOSIS — I110 Hypertensive heart disease with heart failure: Principal | ICD-10-CM | POA: Diagnosis present

## 2017-05-12 DIAGNOSIS — I679 Cerebrovascular disease, unspecified: Secondary | ICD-10-CM | POA: Diagnosis present

## 2017-05-12 DIAGNOSIS — Z8679 Personal history of other diseases of the circulatory system: Secondary | ICD-10-CM

## 2017-05-12 DIAGNOSIS — R296 Repeated falls: Secondary | ICD-10-CM | POA: Diagnosis not present

## 2017-05-12 DIAGNOSIS — I25810 Atherosclerosis of coronary artery bypass graft(s) without angina pectoris: Secondary | ICD-10-CM

## 2017-05-12 DIAGNOSIS — Z9989 Dependence on other enabling machines and devices: Secondary | ICD-10-CM

## 2017-05-12 DIAGNOSIS — R2689 Other abnormalities of gait and mobility: Secondary | ICD-10-CM | POA: Diagnosis present

## 2017-05-12 DIAGNOSIS — Z888 Allergy status to other drugs, medicaments and biological substances status: Secondary | ICD-10-CM

## 2017-05-12 DIAGNOSIS — M19039 Primary osteoarthritis, unspecified wrist: Secondary | ICD-10-CM | POA: Diagnosis present

## 2017-05-12 DIAGNOSIS — M19049 Primary osteoarthritis, unspecified hand: Secondary | ICD-10-CM | POA: Diagnosis present

## 2017-05-12 DIAGNOSIS — M545 Low back pain: Secondary | ICD-10-CM | POA: Diagnosis present

## 2017-05-12 DIAGNOSIS — Z96653 Presence of artificial knee joint, bilateral: Secondary | ICD-10-CM | POA: Diagnosis present

## 2017-05-12 DIAGNOSIS — I959 Hypotension, unspecified: Secondary | ICD-10-CM | POA: Diagnosis not present

## 2017-05-12 DIAGNOSIS — N401 Enlarged prostate with lower urinary tract symptoms: Secondary | ICD-10-CM | POA: Diagnosis present

## 2017-05-12 DIAGNOSIS — Z79899 Other long term (current) drug therapy: Secondary | ICD-10-CM

## 2017-05-12 DIAGNOSIS — I5021 Acute systolic (congestive) heart failure: Secondary | ICD-10-CM

## 2017-05-12 DIAGNOSIS — Z951 Presence of aortocoronary bypass graft: Secondary | ICD-10-CM

## 2017-05-12 DIAGNOSIS — I5023 Acute on chronic systolic (congestive) heart failure: Secondary | ICD-10-CM | POA: Insufficient documentation

## 2017-05-12 DIAGNOSIS — F0150 Vascular dementia without behavioral disturbance: Secondary | ICD-10-CM | POA: Diagnosis present

## 2017-05-12 DIAGNOSIS — I2510 Atherosclerotic heart disease of native coronary artery without angina pectoris: Secondary | ICD-10-CM

## 2017-05-12 DIAGNOSIS — Z9181 History of falling: Secondary | ICD-10-CM

## 2017-05-12 DIAGNOSIS — R0602 Shortness of breath: Secondary | ICD-10-CM

## 2017-05-12 DIAGNOSIS — I491 Atrial premature depolarization: Secondary | ICD-10-CM | POA: Diagnosis not present

## 2017-05-12 DIAGNOSIS — I080 Rheumatic disorders of both mitral and aortic valves: Secondary | ICD-10-CM | POA: Diagnosis present

## 2017-05-12 DIAGNOSIS — J449 Chronic obstructive pulmonary disease, unspecified: Secondary | ICD-10-CM | POA: Diagnosis present

## 2017-05-12 DIAGNOSIS — G43909 Migraine, unspecified, not intractable, without status migrainosus: Secondary | ICD-10-CM | POA: Diagnosis present

## 2017-05-12 HISTORY — DX: Urgency of urination: R39.15

## 2017-05-12 HISTORY — DX: Benign prostatic hyperplasia with lower urinary tract symptoms: N40.1

## 2017-05-12 LAB — BRAIN NATRIURETIC PEPTIDE: B Natriuretic Peptide: 1703.1 pg/mL — ABNORMAL HIGH (ref 0.0–100.0)

## 2017-05-12 LAB — BASIC METABOLIC PANEL
Anion gap: 7 (ref 5–15)
BUN: 23 mg/dL — AB (ref 6–20)
CHLORIDE: 105 mmol/L (ref 101–111)
CO2: 25 mmol/L (ref 22–32)
Calcium: 9.2 mg/dL (ref 8.9–10.3)
Creatinine, Ser: 1.18 mg/dL (ref 0.61–1.24)
GFR calc Af Amer: >60 mL/min (ref >60)
GFR calc non Af Amer: 57 mL/min — ABNORMAL LOW (ref >60)
Glucose, Bld: 152 mg/dL — ABNORMAL HIGH (ref 65–99)
POTASSIUM: 3.4 mmol/L — AB (ref 3.5–5.1)
SODIUM: 137 mmol/L (ref 135–145)

## 2017-05-12 LAB — TSH: TSH: 1.397 u[IU]/mL (ref 0.350–4.500)

## 2017-05-12 LAB — TROPONIN I: TROPONIN I: 0.04 ng/mL — AB (ref <0.03)

## 2017-05-12 MED ORDER — ENSURE ENLIVE PO LIQD
237.0000 mL | Freq: Two times a day (BID) | ORAL | Status: DC
  Administered 2017-05-13 – 2017-05-15 (×5): 237 mL via ORAL

## 2017-05-12 MED ORDER — FUROSEMIDE 10 MG/ML IJ SOLN
40.0000 mg | Freq: Two times a day (BID) | INTRAMUSCULAR | Status: DC
  Administered 2017-05-12 – 2017-05-15 (×6): 40 mg via INTRAVENOUS
  Filled 2017-05-12 (×6): qty 4

## 2017-05-12 MED ORDER — TRAMADOL HCL 50 MG PO TABS
50.0000 mg | ORAL_TABLET | Freq: Four times a day (QID) | ORAL | Status: DC | PRN
  Administered 2017-05-13 – 2017-05-14 (×2): 50 mg via ORAL
  Filled 2017-05-12 (×2): qty 1

## 2017-05-12 MED ORDER — APIXABAN 2.5 MG PO TABS: 5.0000 mg | ORAL_TABLET | Freq: Two times a day (BID) | ORAL | Status: DC

## 2017-05-12 MED ORDER — SODIUM CHLORIDE 0.9% FLUSH: 3.0000 mL | Freq: Two times a day (BID) | INTRAVENOUS | Status: DC

## 2017-05-12 MED ORDER — SODIUM CHLORIDE 0.9 % IV SOLN: 250.0000 mL | INTRAVENOUS | Status: DC | PRN

## 2017-05-12 MED ORDER — ONDANSETRON HCL 4 MG/2ML IJ SOLN: 4.0000 mg | Freq: Four times a day (QID) | INTRAMUSCULAR | Status: DC | PRN

## 2017-05-12 MED ORDER — ACETAMINOPHEN 325 MG PO TABS: 650.0000 mg | ORAL_TABLET | ORAL | Status: DC | PRN

## 2017-05-12 MED ORDER — SODIUM CHLORIDE 0.9 % IV SOLN: 4.0000 mg | Freq: Four times a day (QID) | INTRAVENOUS | Status: DC | PRN

## 2017-05-12 MED ORDER — APIXABAN 5 MG PO TABS
5.0000 mg | ORAL_TABLET | Freq: Two times a day (BID) | ORAL | Status: DC
  Administered 2017-05-12 – 2017-05-15 (×6): 5 mg via ORAL
  Filled 2017-05-12 (×6): qty 1

## 2017-05-12 MED ORDER — ROSUVASTATIN CALCIUM 40 MG PO TABS: 40.0000 mg | ORAL_TABLET | Freq: Every day | ORAL | Status: DC

## 2017-05-12 MED ORDER — BUDESONIDE 32 MCG/ACT NA SUSP
2.0000 | Freq: Every day | NASAL | Status: DC
  Administered 2017-05-13 – 2017-05-15 (×3): 2 via NASAL
  Filled 2017-05-12: qty 8.6

## 2017-05-12 MED ORDER — SODIUM CHLORIDE 0.9% FLUSH: 3.0000 mL | INTRAVENOUS | Status: DC | PRN

## 2017-05-12 MED ORDER — FOLIC ACID 1 MG PO TABS
1.0000 mg | ORAL_TABLET | Freq: Every day | ORAL | Status: DC
  Administered 2017-05-13 – 2017-05-15 (×3): 1 mg via ORAL
  Filled 2017-05-12 (×3): qty 1

## 2017-05-12 MED ORDER — FUROSEMIDE 10 MG/ML IJ SOLN: 40.0000 mg | Freq: Two times a day (BID) | INTRAMUSCULAR | Status: DC

## 2017-05-12 MED ORDER — POTASSIUM CHLORIDE IN NACL 40-0.9 MEQ/L-% IV SOLN
INTRAVENOUS | Status: DC
  Administered 2017-05-12: 50 mL/h via INTRAVENOUS
  Filled 2017-05-12: qty 1000

## 2017-05-12 MED ORDER — LORATADINE 10 MG PO TABS
10.0000 mg | ORAL_TABLET | Freq: Every day | ORAL | Status: DC
  Administered 2017-05-13 – 2017-05-15 (×3): 10 mg via ORAL
  Filled 2017-05-12 (×3): qty 1

## 2017-05-12 MED ORDER — ROSUVASTATIN CALCIUM 5 MG PO TABS: 40.0000 mg | ORAL_TABLET | Freq: Every day | ORAL | Status: DC

## 2017-05-12 MED ORDER — ROSUVASTATIN CALCIUM 40 MG PO TABS
40.0000 mg | ORAL_TABLET | Freq: Every day | ORAL | Status: DC
  Administered 2017-05-12 – 2017-05-14 (×3): 40 mg via ORAL
  Filled 2017-05-12 (×4): qty 1

## 2017-05-12 MED ORDER — FINASTERIDE 5 MG PO TABS
5.0000 mg | ORAL_TABLET | Freq: Every day | ORAL | Status: DC
Start: 2017-05-13 — End: 2017-05-15
  Administered 2017-05-13 – 2017-05-15 (×3): 5 mg via ORAL
  Filled 2017-05-12 (×3): qty 1

## 2017-05-12 MED ORDER — DONEPEZIL HCL 23 MG PO TABS
23.0000 mg | ORAL_TABLET | Freq: Every day | ORAL | Status: DC
  Administered 2017-05-12 – 2017-05-14 (×3): 23 mg via ORAL
  Filled 2017-05-12 (×5): qty 1

## 2017-05-12 MED ORDER — SODIUM CHLORIDE 0.9% FLUSH
3.0000 mL | Freq: Two times a day (BID) | INTRAVENOUS | Status: DC
  Administered 2017-05-12 – 2017-05-15 (×4): 3 mL via INTRAVENOUS

## 2017-05-12 MED ORDER — DULOXETINE HCL 30 MG PO CPEP
30.0000 mg | ORAL_CAPSULE | Freq: Every day | ORAL | Status: DC
  Administered 2017-05-13 – 2017-05-15 (×3): 30 mg via ORAL
  Filled 2017-05-12 (×3): qty 1

## 2017-05-12 NOTE — Progress Notes (Addendum)
CARDIOLOGY OFFICE NOTE  Date:  05/12/2017    Aaron Berg Date of Birth: [birth year] Medical Record #[id number]  PCP:  Aaron Lung, MD  Cardiologist:  Aaron Berg    Chief Complaint  Patient presents with  . Shortness of Breath    Work in visit - seen for Aaron Berg Berg    History of Present Illness: Aaron Berg is a 81 y.o. male who presents today for a work in visit. Seen for Aaron Berg Berg.   He has a history of CAD s/p CABG 2003, prior cath 2012 with patent LIMA toLAD, patent SVG to RCA and 50% ostial stenosis of SVG to LCX. EF at that time 35%. He has chronic combined CHF, mild aortic stenosis/mitral regurg by echo 09/2016, HTN, HLD, paroxysmal atrial fib on Eliquis, OSA on CPAP, COPD, microvascular dementia, CVA, and falls.   Seen back in December for evaluation of irregular HR noted by [facility]. Per phone notes the patient was noted to have HR in the upper 40s and low 50s with irregularly felt. Recent adm 09/2016 for chest pain, atypical, ruled out for MI. During that say he was noted to maintain NSR with PVCs with HR 40s-50s, the bradycardia prompting reduction in beta blocker therapy and ultimate discontinuation given HR 41. 2D echo 10/08/16: EF 40-45%, grade 2 DD, mild aortic stenosis, mild mitral regurgitation. Outpatient nuc was recommended. This turned out to be high risk. Ended up getting recathed - see below - continued medical management recommended.   I then saw him back in March - getting way too much salt - had multiple complaints - diuretics adjusted and family got more involved with his care. On follow up he was improved.  Not on beta blocker due to bradycardia. Not clear why not on ACE/ARB other than soft BP.   Phone call earlier today - "Pt's pt, Aaron Berg called in today pt has more recent SOB, sat's in 90-95 AR.  Aaron Berg will see pt today".  Comes in today. Here with his daughter. She gives most of the history. Has not done well over the past week.  HR  is elevated - up in the 140's by pulse ox here in the office. PT showed up today and then called the daughter for concerns of breathing - noted more PND/orthopnea over the weekend - waking up short of breath and had to sit up. By yesterday - now with trouble breathing just sitting up. More swelling. No chest pain but has had some discomfort - just not at this time. No actual awareness of his HR. Hard for him to say how he feels - "just know something is not right". Compliant with his medicines but admits he "missed everything last night". Weight is up since last visit here. Says he is "gasping" here today.   Past Medical History:  Diagnosis Date  . Aortic stenosis   . Arthritis    "thumbs, wrists, legs" (10/06/2016)  . Atrial flutter ([place])   . CAD (coronary artery disease)    a. s/p CABG 2003.  [name] Cerebrovascular disease 2006   multiple ischemic changes on prior MRI  . Chronic combined systolic and diastolic CHF (congestive heart failure) ([place])   . Chronic lower back pain   . Complication of anesthesia    "woke up too soon"  . COPD (chronic obstructive pulmonary disease) ([place])    "no signs or tests"  . CVA (cerebral vascular accident) ([place])   . Dementia   .  Depression   . Dry eyes   . Dyslipidemia    well controlled  . Gait disorder   . Heart murmur    "found in ~ 1946"  . Hypertension   . Hypertensive cardiovascular disease   . Male hypogonadism   . Migraine    "major one when I was a kid; very painful"  . Mitral regurgitation   . Multiple falls    "after both knees surgeries; went to rehab; came out and fell all the time" (10/06/2016)  . Obesity   . OSA on CPAP    cpap  . Paroxysmal atrial fibrillation (HCC)   . Skin cancer of anterior chest   . Spinal stenosis     Past Surgical History:  Procedure Laterality Date  . APPENDECTOMY  as child  . CARDIAC CATHETERIZATION  11/2003   Aaron Berg 04/29/2011  . CARDIAC CATHETERIZATION N/A 12/03/2016   Procedure: Left Heart Cath and  Cors/Grafts Angiography;  Surgeon: Aaron Crome, MD;  Location: [facility];  Service: Cardiovascular;  Laterality: N/A;  . CARDIOVERSION N/A 04/26/2014   Procedure: CARDIOVERSION;  Surgeon: Aaron Grooms, MD;  Location: [facility];  Service: Cardiovascular;  Laterality: N/A;  . CARDIOVERSION N/A 05/24/2014   Procedure: CARDIOVERSION;  Surgeon: Aaron Grooms, MD;  Location: [facility];  Service: Cardiovascular;  Laterality: N/A;  . CATARACT EXTRACTION W/ INTRAOCULAR LENS  IMPLANT, BILATERAL Bilateral 2000s  . CORONARY ANGIOPLASTY  1991   "no stents" (10/06/2016)  . CORONARY ARTERY BYPASS GRAFT  11/2002   X 3/notes 04/29/2011  . CYST EXCISION     lumbar; Aaron Berg  . EYE SURGERY Right    detached retina  . JOINT REPLACEMENT    . MOHS SURGERY     on chest  . RETINAL DETACHMENT SURGERY Right   . SHOULDER SURGERY Right    Right with murphy, reattach ligaments  . TONSILLECTOMY  as child  . TOTAL KNEE ARTHROPLASTY Right 01/02/2014   Procedure: RIGHT TOTAL KNEE ARTHROPLASTY;  Surgeon: Aaron Pole, MD;  Location: [facility];  Service: Orthopedics;  Laterality: Right;  . TOTAL KNEE ARTHROPLASTY Left 01/31/2014   Procedure: LEFT TOTAL KNEE ARTHROPLASTY;  Surgeon: Aaron Pole, MD;  Location: [facility];  Service: Orthopedics;  Laterality: Left;  . TOTAL SHOULDER ARTHROPLASTY Right   . VIDEO ASSISTED THORACOSCOPY (VATS)/THOROCOTOMY  2003   benign nodule     Medications: Current Outpatient Prescriptions  Medication Sig Dispense Refill  . apixaban (ELIQUIS) 5 MG TABS tablet Take 1 tablet (5 mg total) by mouth 2 (two) times daily. 180 tablet 3  . Budesonide (RHINOCORT ALLERGY NA) Place 1 spray into the nose daily.    . cetirizine (ZYRTEC) 10 MG tablet Take 10 mg by mouth daily.    . cholecalciferol (VITAMIN D) 1000 UNITS tablet Take 1,000 Units by mouth daily.     [name] donepezil (ARICEPT) 23 MG TABS tablet TAKE 1 TABLET AT BEDTIME 90 tablet 1  . DULoxetine (CYMBALTA) 30 MG capsule  Take 1 capsule (30 mg total) by mouth daily. 30 capsule 3  . feeding supplement, ENSURE ENLIVE, (ENSURE ENLIVE) LIQD Take 237 mLs by mouth 2 (two) times daily between meals. 237 mL 12  . finasteride (PROSCAR) 5 MG tablet Take 1 tablet (5 mg total) by mouth daily. 90 tablet 3  . folic acid (FOLVITE) 1 MG tablet Take 1 tablet (1 mg total) by mouth daily. 30 tablet 1  . furosemide (LASIX) 20 MG tablet Take  20 mg by mouth daily.    . Multiple Vitamins-Minerals (MULTIVITAMIN WITH MINERALS) tablet Take 1 tablet by mouth daily. Reported on 01/30/2016    . nitroGLYCERIN (NITROSTAT) 0.4 MG SL tablet Place 1 tablet (0.4 mg total) under the tongue every 5 (five) minutes as needed for chest pain. 25 tablet 12  . rosuvastatin (CRESTOR) 40 MG tablet TAKE 1 TABLET DAILY WITH BREAKFAST FOR HYPERLIPIDEMIA (KEEP UPCOMING APPOINTMENT IN ORDER TO RECEIVE ADDITIONAL REFILLS) 90 tablet 3  . sodium chloride (OCEAN) 0.65 % SOLN nasal spray Place 1 spray into both nostrils daily as needed for congestion.     . Testosterone 20.25 MG/1.25GM (1.62%) GEL Apply 2 Squirts topically daily as needed (occasionally). One pump per shoulder.    . traMADol (ULTRAM) 50 MG tablet Take 1 tablet (50 mg total) by mouth every 8 (eight) hours as needed. 50 tablet 1   Current Facility-Administered Medications  Medication Dose Route Frequency Provider Last Rate Last Dose  . 0.9 %  sodium chloride infusion  250 mL Intravenous PRN Burtis Junes, NP      . acetaminophen (TYLENOL) tablet 650 mg  650 mg Oral Q4H PRN Burtis Junes, NP      . apixaban (ELIQUIS) tablet 5 mg  5 mg Oral BID Truitt Merle C, NP      . furosemide (LASIX) injection 40 mg  40 mg Intravenous Q12H Truitt Merle C, NP      . ondansetron (ZOFRAN) 4 mg in sodium chloride 0.9 % 50 mL IVPB  4 mg Intravenous Q6H PRN Truitt Merle C, NP      . rosuvastatin (CRESTOR) tablet 40 mg  40 mg Oral q1800 Truitt Merle C, NP      . sodium chloride flush (NS) 0.9 % injection 3 mL  3  mL Intravenous Q12H Truitt Merle C, NP      . sodium chloride flush (NS) 0.9 % injection 3 mL  3 mL Intravenous PRN Burtis Junes, NP        Allergies: Allergies  Allergen Reactions  . Procardia [Nifedipine] Rash    All over body    Social History: The patient  reports that he quit smoking about 27 years ago. His smoking use included Cigarettes. He has a 66.00 pack-year smoking history. He has never used smokeless tobacco. He reports that he drinks about 8.4 oz of alcohol per week . He reports that he does not use drugs.   Family History: The patient's family history includes Arthritis in his sister; CAD in his father and mother; Healthy in his sister; Pancreatic cancer in his father.   Review of Systems: Please see the history of present illness.   Otherwise, the review of systems is positive for none.   All other systems are reviewed and negative.   Physical Exam: VS:  BP 96/76 (BP Location: Left Arm, Patient Position: Sitting, Cuff Size: Normal)   Pulse (!) 101   Ht 5\' 10"  (1.778 m)   Wt 202 lb 6.4 oz (91.8 kg)   BMI 29.04 kg/m  .  BMI Body mass index is 29.04 kg/m.  Wt Readings from Last 3 Encounters:  05/12/17 202 lb 6.4 oz (91.8 kg)  04/14/17 196 lb (88.9 kg)  03/23/17 199 lb 1.9 oz (90.3 kg)    General: Elderly male. Alert and and in no acute distress but he is a little gaspy at times during the visit. Weight is up 6 pounds this month.  HR noted initially to be  120's then to 140's on pulse ox. HR lower by EKG.  HEENT: Normal.  Neck: Supple, no JVD, carotid bruits, or masses noted.  Cardiac: Irregular irregular rhythm on exam. Rate is fast - variable control. Outflow murmur. +1 edema.  Respiratory:  Lungs with some rales in the bases and with increased work of breathing.  GI: Soft and nontender.  MS: No deformity or atrophy. Gait and ROM intact. Using a walker.  Skin: Warm and dry. Color is normal.  Neuro:  Strength and sensation are intact and no gross focal  deficits noted.  Psych: Alert, appropriate and with normal affect.   LABORATORY DATA:  EKG:  EKG is ordered today. This shows NSR today with PAC's.   Lab Results  Component Value Date   WBC 7.1 03/04/2017   HGB 13.7 11/28/2016   HCT 41.2 03/04/2017   PLT 128 (L) 03/04/2017   GLUCOSE 87 03/23/2017   CHOL 191 01/09/2016   TRIG 251 (H) 01/09/2016   HDL 56 01/09/2016   LDLCALC 85 01/09/2016   ALT 15 03/04/2017   AST 20 03/04/2017   NA 141 03/23/2017   K 4.2 03/23/2017   CL 101 03/23/2017   CREATININE 1.26 03/23/2017   BUN 23 03/23/2017   CO2 24 03/23/2017   TSH 1.501 01/09/2016   PSA 4.91 (H) 03/29/2015   PSA 5.07 (H) 03/29/2015   INR 1.1 11/28/2016   HGBA1C 6.1 (H) 08/15/2014    BNP (last 3 results) No results for input(s): BNP in the last 8760 hours.  ProBNP (last 3 results)  Recent Labs  03/04/17 0924 03/23/17 1452  PROBNP 5,228* 4,260*     Other Studies Reviewed Today:   Procedures   Left Heart Cath and Cors/Grafts Angiography 11/2016  Conclusion     Ost Cx lesion, 100 %stenosed.   Severe native vessel coronary disease with total occlusion of the right coronary artery, 99% stenosis of the entire left main and ostial LAD, and total occlusion of the circumflex.  Patent saphenous vein graft to the circumflex/ramus, Patent saphenous vein graft to the PDA, and patent LIMA to the LAD.  No gradient across the aortic valve. Elevated left ventricular end-diastolic pressure 26 mmHg.  Abnormal myocardial perfusion study likely represents myocardial infarction dating to pre-bypass surgery.  RECOMMENDATIONS:   Continue medical therapy  Angio-Seal deployed. If no difficulty with femoral access may be discharged later today.    Myoview 11/2016    Nuclear stress EF: 41%.  There was no ST segment deviation noted during stress.  There is a medium defect of moderate severity present in the basal inferior, mid inferior, apical lateral and apex  location. The defect is non-reversible and consistent with prior scar of diaphragmatic attenuation artifact.  This is a high risk study.  The left ventricular ejection fraction is moderately decreased (30-44%).    ECHO 09/2016: Study Conclusions  - Left ventricle: The cavity size was moderately dilated. Systolic function was mildly to moderately reduced. The estimated ejection fraction was in the range of 40% to 45%. Diffuse hypokinesis. Features are consistent with a pseudonormal left ventricular filling pattern, with concomitant abnormal relaxation and increased filling pressure (grade 2 diastolic dysfunction). - Aortic valve: Moderately calcified annulus. Trileaflet; moderately thickened, moderately calcified leaflets. There was mild stenosis. Valve area (VTI): 1.14 cm^2. Valve area (Vmax): 1.18 cm^2. Valve area (Vmean): 1.08 cm^2. - Mitral valve: There was mild regurgitation.   ASSESSMENT AND PLAN:  1. Acute on chronic combined systolic and diastolic HF - has  been getting too much salt - may also be going in and out of AF which may be driving his symptoms - subsequently seen with Aaron Berg Berg - will admit for IV diuresis in light of his inability to lie flat.   2. PAF - not on beta blocker due to prior bradycardia. HR up today. EKG with NSR but his HR was higher initially by pulse ox. Probably some degree of tachy brady as well as going in and out of AF. Would need to be on telemetry to monitor for rate control. Hopefully PPM not indicated. Will hold on beta blocker at this time and monitor the HR/rhythm on telemetry - further disposition to follow.   3. Known CAD with CABG in 2004 - recent cath from 11/2016 noted - medically managed - some chest discomfort noted but I suspect this is more from recurrent AF and being in heart failure. Will follow enzymes.   4. Prior bradycardia now off BB since prior to discharge 10/08/16. Remains on Aricept however. HR back  up today  See #2.   5. Dementia - on Aricept. Daughter now managing his medicines but he "missed all of them" last night.    Current medicines are reviewed with the patient today.  The patient does not have concerns regarding medicines other than what has been noted above.  The following changes have been made:  See above.  Labs/ tests ordered today include:    Orders Placed This Encounter  Procedures  . DG Chest 2 View  . Basic metabolic panel  . CBC WITH DIFFERENTIAL  . Comprehensive metabolic panel  . Magnesium  . Brain natriuretic peptide  . TSH  . Troponin I  . Diet Heart Room service appropriate? Yes; Fluid consistency: Thin  . Vital signs  . Cardiac monitoring  . Notify physician (specify)  . Activity per Heart Failure Clinical Pathway  . Initiate Heart Failure clinical pathway  . Weigh patient on admission  . Daily weights  . Strict intake and output  . In and Out Cath  . Patient education (specify):  . RN may order Cardiology PRN Orders utilizing "Cardiology PRN medications" (through manage orders) for the following patient needs: indigestion (Maalox), cough (Robitussin DM), constipation (MOM), diarrhea (Imodium), or minor skin irritation (Hydrocortisone Cream)  . Bed rest with bathroom privileges  . No beta blocker  . No ACE Inhibitor / ARB  . Full code  . Consult to case management  . Oxygen therapy Mode or (Route): Nasal cannula; Keep 02 saturation: >/= 92%  . EKG 12-Lead  . Insert peripheral IV  . Place in observation (patient's expected length of stay will be less than 2 midnights)     Disposition:   Admitting to [facility] for IV diuresis. Patient subsequently seen with Aaron Berg Berg who is in agreement with this plan of care. Transported by daughter via car.   Patient is agreeable to this plan and will call if any problems develop in the interim.   SignedTruitt Merle, NP  05/12/2017 4:09 PM  [facility] [address]  [place], [place]  [address] Phone: [phone] Fax: [phone]

## 2017-05-12 NOTE — H&P (Addendum)
Office Visit   05/12/2017 [name]  [place], [name], NP  Cardiology   Chronic combined systolic and diastolic CHF (congestive heart failure) (HCC) +3 more  Dx   Shortness of Breath ; Referred by [name], MD  Reason for Visit   Additional Documentation   Vitals:   BP 96/76 (BP Location: Left Arm, Patient Position: Sitting, Cuff Size: Normal)   Pulse  101   Ht 5\' 10"  (1.778 m)   Wt 202 lb 6.4 oz (91.8 kg)   BMI 29.04 kg/m   BSA 2.13 m      More Vitals   Flowsheets:   Custom Formula Data,   Anthropometrics,   MEWS Score     Encounter Info:   Billing Info,   History,   Allergies,   Detailed Report     All Notes   Addendum Note by Burtis Junes, NP at 05/12/2017 3:00 PM   Author: Burtis Junes, NP Author Type: Nurse Practitioner Filed: 05/12/2017 4:22 PM  Note Status: Signed Cosign: Cosign Not Required Encounter Date: 05/12/2017  Editor: Burtis Junes, NP (Nurse Practitioner)    Addended by: Burtis Junes on: 05/12/2017 04:22 PM   Modules accepted: Level of Service     Progress Notes by Burtis Junes, NP at 05/12/2017 3:00 PM   Author: Burtis Junes, NP Author Type: Nurse Practitioner Filed: 05/12/2017 4:22 PM  Note Status: Addendum Cosign: Cosign Not Required Encounter Date: 05/12/2017  Editor: Burtis Junes, NP (Nurse Practitioner)  Prior Versions: 1. Burtis Junes, NP (Nurse Practitioner) at 05/12/2017 4:09 PM - Signed  Expand All Collapse All       CARDIOLOGY OFFICE NOTE  Date:  05/12/2017    Aaron Berg Date of Birth: Dec 29, 1935 Medical Record #[id number]  PCP:  [name], MD    Cardiologist:  Jennings [employer]        Chief Complaint  Patient presents with  . Shortness of Breath    Work in visit - seen for Dr. Tamala Julian    History of Present Illness: Aaron Berg is a 81 y.o. male who presents today for a work in visit. Seen for Dr. Tamala Julian.   He has a history of CAD s/p  CABG 2003, prior cath 2012 with patent LIMA toLAD, patent SVG to RCA and 50% ostial stenosis of SVG to LCX. EF at that time 35%. He has chronic combined CHF, mild aortic stenosis/mitral regurg by echo 09/2016, HTN, HLD, paroxysmal atrial fib on Eliquis, OSA on CPAP, COPD, microvascular dementia, CVA, and falls.   Seen back in December for evaluation of irregular HR noted by [facility]. Per phone notes the patient was noted to have HR in the upper 40s and low 50s with irregularly felt. Recent adm 09/2016 for chest pain, atypical, ruled out for MI. During that say he was noted to maintain NSR with PVCs with HR 40s-50s, the bradycardia prompting reduction in beta blocker therapy and ultimate discontinuation given HR 41. 2D echo 10/08/16: EF 40-45%, grade 2 DD, mild aortic stenosis, mild mitral regurgitation. Outpatient nuc was recommended. This turned out to be high risk. Ended up getting recathed - see below - continued medical management recommended.   I then saw him back in March - getting way too much salt - had multiple complaints - diuretics adjusted and family got more involved with his care. On follow up he was improved.  Not on beta blocker due to bradycardia.  Not clear why not on ACE/ARB other than soft BP.   Phone call earlier today - "Pt's pt, Aaron Berg called in today pt has more recent SOB, sat's in 90-95 AR. [name] will see pt today".  Comes in today. Here with his daughter. She gives most of the history. Has not done well over the past week.  HR is elevated - up in the 140's by pulse ox here in the office. PT showed up today and then called the daughter for concerns of breathing - noted more PND/orthopnea over the weekend - waking up short of breath and had to sit up. By yesterday - now with trouble breathing just sitting up. More swelling. No chest pain but has had some discomfort - just not at this time. No actual awareness of his HR. Hard for him to say how he feels - "just know something is  not right". Compliant with his medicines but admits he "missed everything last night". Weight is up since last visit here. Says he is "gasping" here today.       Past Medical History:  Diagnosis Date  . Aortic stenosis   . Arthritis    "thumbs, wrists, legs" (10/06/2016)  . Atrial flutter ([place])   . CAD (coronary artery disease)    a. s/p CABG 2003.  [name] Cerebrovascular disease 2006   multiple ischemic changes on prior MRI  . Chronic combined systolic and diastolic CHF (congestive heart failure) ([place])   . Chronic lower back pain   . Complication of anesthesia    "woke up too soon"  . COPD (chronic obstructive pulmonary disease) ([place])    "no signs or tests"  . CVA (cerebral vascular accident) ([place])   . Dementia   . Depression   . Dry eyes   . Dyslipidemia    well controlled  . Gait disorder   . Heart murmur    "found in ~ 1946"  . Hypertension   . Hypertensive cardiovascular disease   . Male hypogonadism   . Migraine    "major one when I was a kid; very painful"  . Mitral regurgitation   . Multiple falls    "after both knees surgeries; went to rehab; came out and fell all the time" (10/06/2016)  . Obesity   . OSA on CPAP    cpap  . Paroxysmal atrial fibrillation (HCC)   . Skin cancer of anterior chest   . Spinal stenosis          Past Surgical History:  Procedure Laterality Date  . APPENDECTOMY  as child  . CARDIAC CATHETERIZATION  11/2003   Archie Endo 04/29/2011  . CARDIAC CATHETERIZATION N/A 12/03/2016   Procedure: Left Heart Cath and Cors/Grafts Angiography;  Surgeon: Belva Crome, MD;  Location: [facility] CV [facility];  Service: Cardiovascular;  Laterality: N/A;  . CARDIOVERSION N/A 04/26/2014   Procedure: CARDIOVERSION;  Surgeon: Sinclair Grooms, MD;  Location: [facility] ENDOSCOPY;  Service: Cardiovascular;  Laterality: N/A;  . CARDIOVERSION N/A 05/24/2014   Procedure: CARDIOVERSION;  Surgeon: Sinclair Grooms, MD;  Location: [facility];  Service: Cardiovascular;  Laterality: N/A;  . CATARACT EXTRACTION W/ INTRAOCULAR LENS  IMPLANT, BILATERAL Bilateral 2000s  . CORONARY ANGIOPLASTY  1991   "no stents" (10/06/2016)  . CORONARY ARTERY BYPASS GRAFT  11/2002   X 3/notes 04/29/2011  . CYST EXCISION     lumbar; Dr. Joya Salm  . EYE SURGERY Right    detached retina  . JOINT REPLACEMENT    .  MOHS SURGERY     on chest  . RETINAL DETACHMENT SURGERY Right   . SHOULDER SURGERY Right    Right with murphy, reattach ligaments  . TONSILLECTOMY  as child  . TOTAL KNEE ARTHROPLASTY Right 01/02/2014   Procedure: RIGHT TOTAL KNEE ARTHROPLASTY;  Surgeon: Mauri Pole, MD;  Location: [facility] ORS;  Service: Orthopedics;  Laterality: Right;  . TOTAL KNEE ARTHROPLASTY Left 01/31/2014   Procedure: LEFT TOTAL KNEE ARTHROPLASTY;  Surgeon: Mauri Pole, MD;  Location: [facility] ORS;  Service: Orthopedics;  Laterality: Left;  . TOTAL SHOULDER ARTHROPLASTY Right   . VIDEO ASSISTED THORACOSCOPY (VATS)/THOROCOTOMY  2003   benign nodule     Medications:       Current Outpatient Prescriptions  Medication Sig Dispense Refill  . apixaban (ELIQUIS) 5 MG TABS tablet Take 1 tablet (5 mg total) by mouth 2 (two) times daily. 180 tablet 3  . Budesonide (RHINOCORT ALLERGY NA) Place 1 spray into the nose daily.    . cetirizine (ZYRTEC) 10 MG tablet Take 10 mg by mouth daily.    . cholecalciferol (VITAMIN D) 1000 UNITS tablet Take 1,000 Units by mouth daily.     [name] donepezil (ARICEPT) 23 MG TABS tablet TAKE 1 TABLET AT BEDTIME 90 tablet 1  . DULoxetine (CYMBALTA) 30 MG capsule Take 1 capsule (30 mg total) by mouth daily. 30 capsule 3  . feeding supplement, ENSURE ENLIVE, (ENSURE ENLIVE) LIQD Take 237 mLs by mouth 2 (two) times daily between meals. 237 mL 12  . finasteride (PROSCAR) 5 MG tablet Take 1 tablet (5 mg total) by mouth daily. 90 tablet 3  . folic acid (FOLVITE) 1 MG tablet Take 1 tablet (1 mg total) by mouth daily. 30  tablet 1  . furosemide (LASIX) 20 MG tablet Take 20 mg by mouth daily.    . Multiple Vitamins-Minerals (MULTIVITAMIN WITH MINERALS) tablet Take 1 tablet by mouth daily. Reported on 01/30/2016    . nitroGLYCERIN (NITROSTAT) 0.4 MG SL tablet Place 1 tablet (0.4 mg total) under the tongue every 5 (five) minutes as needed for chest pain. 25 tablet 12  . rosuvastatin (CRESTOR) 40 MG tablet TAKE 1 TABLET DAILY WITH BREAKFAST FOR HYPERLIPIDEMIA (KEEP UPCOMING APPOINTMENT IN ORDER TO RECEIVE ADDITIONAL REFILLS) 90 tablet 3  . sodium chloride (OCEAN) 0.65 % SOLN nasal spray Place 1 spray into both nostrils daily as needed for congestion.     . Testosterone 20.25 MG/1.25GM (1.62%) GEL Apply 2 Squirts topically daily as needed (occasionally). One pump per shoulder.    . traMADol (ULTRAM) 50 MG tablet Take 1 tablet (50 mg total) by mouth every 8 (eight) hours as needed. 50 tablet 1            Current Facility-Administered Medications  Medication Dose Route Frequency Provider Last Rate Last Dose  . 0.9 %  sodium chloride infusion  250 mL Intravenous PRN Burtis Junes, NP      . acetaminophen (TYLENOL) tablet 650 mg  650 mg Oral Q4H PRN Burtis Junes, NP      . apixaban (ELIQUIS) tablet 5 mg  5 mg Oral BID Truitt Merle C, NP      . furosemide (LASIX) injection 40 mg  40 mg Intravenous Q12H Truitt Merle C, NP      . ondansetron (ZOFRAN) 4 mg in sodium chloride 0.9 % 50 mL IVPB  4 mg Intravenous Q6H PRN Truitt Merle C, NP      . rosuvastatin (CRESTOR) tablet 40 mg  40 mg Oral q1800 Truitt Merle C, NP      . sodium chloride flush (NS) 0.9 % injection 3 mL  3 mL Intravenous Q12H Gerhardt, Lori C, NP      . sodium chloride flush (NS) 0.9 % injection 3 mL  3 mL Intravenous PRN Burtis Junes, NP        Allergies:      Allergies  Allergen Reactions  . Procardia [Nifedipine] Rash    All over body    Social History: The patient  reports that he quit smoking about 27 years ago.  His smoking use included Cigarettes. He has a 66.00 pack-year smoking history. He has never used smokeless tobacco. He reports that he drinks about 8.4 oz of alcohol per week . He reports that he does not use drugs.   Family History: The patient's family history includes Arthritis in his sister; CAD in his father and mother; Healthy in his sister; Pancreatic cancer in his father.   Review of Systems: Please see the history of present illness.   Otherwise, the review of systems is positive for none.   All other systems are reviewed and negative.   Physical Exam: VS:  BP 96/76 (BP Location: Left Arm, Patient Position: Sitting, Cuff Size: Normal)   Pulse (!) 101   Ht 5\' 10"  (1.778 m)   Wt 202 lb 6.4 oz (91.8 kg)   BMI 29.04 kg/m  .  BMI Body mass index is 29.04 kg/m.     Wt Readings from Last 3 Encounters:  05/12/17 202 lb 6.4 oz (91.8 kg)  04/14/17 196 lb (88.9 kg)  03/23/17 199 lb 1.9 oz (90.3 kg)    General: Elderly male. Alert and and in no acute distress but he is a little gaspy at times during the visit. Weight is up 6 pounds this month.  HR noted initially to be 120's then to 140's on pulse ox. HR lower by EKG.  HEENT: Normal.  Neck: Supple, no JVD, carotid bruits, or masses noted.  Cardiac: Irregular irregular rhythm on exam. Rate is fast - variable control. Outflow murmur. +1 edema.  Respiratory:  Lungs with some rales in the bases and with increased work of breathing.  GI: Soft and nontender.  MS: No deformity or atrophy. Gait and ROM intact. Using a walker.  Skin: Warm and dry. Color is normal.  Neuro:  Strength and sensation are intact and no gross focal deficits noted.  Psych: Alert, appropriate and with normal affect.   LABORATORY DATA:  EKG:  EKG is ordered today. This shows NSR today with PAC's.   RecentLabs       Lab Results  Component Value Date   WBC 7.1 03/04/2017   HGB 13.7 11/28/2016   HCT 41.2 03/04/2017   PLT 128 (L) 03/04/2017    GLUCOSE 87 03/23/2017   CHOL 191 01/09/2016   TRIG 251 (H) 01/09/2016   HDL 56 01/09/2016   LDLCALC 85 01/09/2016   ALT 15 03/04/2017   AST 20 03/04/2017   NA 141 03/23/2017   K 4.2 03/23/2017   CL 101 03/23/2017   CREATININE 1.26 03/23/2017   BUN 23 03/23/2017   CO2 24 03/23/2017   TSH 1.501 01/09/2016   PSA 4.91 (H) 03/29/2015   PSA 5.07 (H) 03/29/2015   INR 1.1 11/28/2016   HGBA1C 6.1 (H) 08/15/2014      BNP (last 3 results) RecentLabs(withinlast365days)  No results for input(s): BNP in the last 8760 hours.  ProBNP (last 3 results)  RecentLabs(withinlast365days)   Recent Labs  03/04/17 0924 03/23/17 1452  PROBNP 5,228* 4,260*       Other Studies Reviewed Today:   Procedures   Left Heart Cath and Cors/Grafts Angiography 11/2016  Conclusion     Ost Cx lesion, 100 %stenosed.   Severe native vessel coronary disease with total occlusion of the right coronary artery, 99% stenosis of the entire left main and ostial LAD, and total occlusion of the circumflex.  Patent saphenous vein graft to the circumflex/ramus, Patent saphenous vein graft to the PDA, and patent LIMA to the LAD.  No gradient across the aortic valve. Elevated left ventricular end-diastolic pressure 26 mmHg.  Abnormal myocardial perfusion study likely represents myocardial infarction dating to pre-bypass surgery.  RECOMMENDATIONS:   Continue medical therapy  Angio-Seal deployed. If no difficulty with femoral access may be discharged later today.    Myoview 11/2016    Nuclear stress EF: 41%.  There was no ST segment deviation noted during stress.  There is a medium defect of moderate severity present in the basal inferior, mid inferior, apical lateral and apex location. The defect is non-reversible and consistent with prior scar of diaphragmatic attenuation artifact.  This is a high risk study.  The left ventricular ejection  fraction is moderately decreased (30-44%).    ECHO 09/2016: Study Conclusions  - Left ventricle: The cavity size was moderately dilated. Systolic function was mildly to moderately reduced. The estimated ejection fraction was in the range of 40% to 45%. Diffuse hypokinesis. Features are consistent with a pseudonormal left ventricular filling pattern, with concomitant abnormal relaxation and increased filling pressure (grade 2 diastolic dysfunction). - Aortic valve: Moderately calcified annulus. Trileaflet; moderately thickened, moderately calcified leaflets. There was mild stenosis. Valve area (VTI): 1.14 cm^2. Valve area (Vmax): 1.18 cm^2. Valve area (Vmean): 1.08 cm^2. - Mitral valve: There was mild regurgitation.   ASSESSMENT AND PLAN:  1. Acute on chronic combined systolic and diastolic HF - has been getting too much salt - may also be going in and out of AF which may be driving his symptoms - subsequently seen with Dr. Tamala Julian - will admit for IV diuresis in light of his inability to lie flat. BP too soft at this time for ACE - he is not on prior to admission.   2. PAF - not on beta blocker due to prior bradycardia. HR up today. EKG with NSR but his HR was higher initially by pulse ox. Probably some degree of tachy brady as well as going in and out of AF. Would need to be on telemetry to monitor for rate control. Hopefully PPM not indicated. Will hold on beta blocker at this time and monitor the HR/rhythm on telemetry - further disposition to follow.   3. Known CAD with CABG in 2004 - recent cath from 11/2016 noted - medically managed - some chest discomfort noted but I suspect this is more from recurrent AF and being in heart failure. Will follow enzymes.   4. Prior bradycardia now off BB since prior to discharge 10/08/16. Remains on Aricept however. HR back up today  See #2.   5. Dementia - on Aricept. Daughter now managing his medicines but he "missed all  of them" last night.    Current medicines are reviewed with the patient today.  The patient does not have concerns regarding medicines other than what has been noted above.  The following changes have been made:  See above.  Labs/ tests  ordered today include:       Orders Placed This Encounter  Procedures  . DG Chest 2 View  . Basic metabolic panel  . CBC WITH DIFFERENTIAL  . Comprehensive metabolic panel  . Magnesium  . Brain natriuretic peptide  . TSH  . Troponin I  . Diet Heart Room service appropriate? Yes; Fluid consistency: Thin  . Vital signs  . Cardiac monitoring  . Notify physician (specify)  . Activity per Heart Failure Clinical Pathway  . Initiate Heart Failure clinical pathway  . Weigh patient on admission  . Daily weights  . Strict intake and output  . In and Out Cath  . Patient education (specify):  . RN may order Cardiology PRN Orders utilizing "Cardiology PRN medications" (through manage orders) for the following patient needs: indigestion (Maalox), cough (Robitussin DM), constipation (MOM), diarrhea (Imodium), or minor skin irritation (Hydrocortisone Cream)  . Bed rest with bathroom privileges  . No beta blocker  . No ACE Inhibitor / ARB  . Full code  . Consult to case management  . Oxygen therapy Mode or (Route): Nasal cannula; Keep 02 saturation: >/= 92%  . EKG 12-Lead  . Insert peripheral IV  . Place in observation (patient's expected length of stay will be less than 2 midnights)     Disposition:   Admitting to [facility] for IV diuresis. Patient subsequently seen with Dr. Tamala Julian who is in agreement with this plan of care. Transported by daughter via car.   Patient is agreeable to this plan and will call if any problems develop in the interim.   SignedTruitt Merle, NP  05/12/2017 4:09 PM  [facility] [address] [place], [place]  [address] Phone: [phone] Fax: [phone]             Procedures by Burtis Junes, NP at 05/12/2017 4:12 PM   Author: Burtis Junes, NP Author Type: Nurse Practitioner Filed: 05/12/2017 4:12 PM  Note Status: Signed Cosign: Cosign Not Required Encounter Date: 05/12/2017  Editor: Sallee Provencal L        Scan on 05/12/2017 4:12 PM by Myles Rosenthal : Ekg - [place] on 05/12/2017 4:12 PM by Myles Rosenthal [facility]  Patient Instructions by Burtis Junes, NP at 05/12/2017 3:00 PM   Author: Burtis Junes, NP Author Type: Nurse Practitioner Filed: 05/12/2017 3:47 PM  Note Status: Addendum Cosign: Cosign Not Required Encounter Date: 05/12/2017  Editor: Burtis Junes, NP (Nurse Practitioner)  Prior Versions: 1. Burtis Junes, NP (Nurse Practitioner) at 05/12/2017 3:27 PM - Signed    We are admitting you to the hospital today.   Call the [place] office at [phone] if you have any questions, problems or concerns.         Instructions   We are admitting you to the hospital today.   Call the [place] office at [phone] if you have any questions, problems or concerns.       Communications      [employer] Provider CC Chart Rep sent to [name], MD  Media   Electronic signature on 05/12/2017 3:07 PM   Communication Routing History   Recipient Method Sent by Date Sent  [name], MD In Philis Pique, NP 05/12/2017     Orders Placed     Labs    Basic metabolic panel Daily  Brain natriuretic peptide Once    CBC WITH DIFFERENTIAL Once    Comprehensive metabolic panel Once    Magnesium Once    Troponin I Now then every 6 hours    TSH Once   .[name].(5 more)   Imaging    DG Chest 2 View 1 time imaging      Other Orders     EKG 12-Lead    Activity per Heart Failure Clinical Pathway Once    aspirin contraindicated due to Once    Bed rest with bathroom privileges Until  discontinued    Cardiac monitoring Until discontinued    Consult to case management Once    Daily weights Daily    Diet Heart Room service appropriate? Yes; Fluid consistency: Thin Diet effective now    In and Out Cath As needed    Initiate Heart Failure clinical pathway Once    Insert peripheral IV Once    No ACE Inhibitor / ARB Until discontinued    No beta blocker Until discontinued    Notify physician (specify) As needed    On Full Dose Anticoagulation (satisfies pharmacologic VTE prophylaxis) Once    On Full Dose Anticoagulation (no mechanical VTE prophylaxis ordered) Once    Oxygen therapy Mode or (Route): Nasal cannula; Keep 02 saturation: >/= 92% Continuous    Patient education (specify): Once    Place in observation (patient's expected length of stay will be less than 2 midnights) Once    Reason for no LVF assessment Once    RN may order Cardiology PRN Orders utilizing "Cardiology PRN medications" (through manage orders) for the following patient needs: indigestion (Maalox), cough (Robitussin DM), constipation (MOM), diarrhea (Imodium), or minor skin irritation (Hydrocortisone Cream) As needed    Strict intake and output Until discontinued    Vital signs Per unit routine    Weigh patient on admission Once   .[name].(22 more) Medication Changes      Acetaminophen 650 mg Oral Every 4 hours PRN, Maximum dose of acetaminophen in 4000 mg from all sources in 24 hours.     ondansetron (ZOFRAN) 4 mg in sodium chloride 0.9 %... 4 mg Intravenous Every 6 hours PRN @ 208 mL/hr     Sodium Chloride 0.9 % 250 mLs Intravenous As needed @ 0-10 mL/hr, Flush tubing as needed.     Sodium Chloride Flush       0.9 % Soln, 3 mLs Intravenous Every 12 hours     0.9 % Soln, 3 mLs Intravenous As needed     Apixaban       5 mg Oral 2 times daily     5 mg Oral 2 times daily     Furosemide       20 mg Oral Daily     40 mg Intravenous Every 12 hours, For IV administration:  Give IV push at 20 - 40 mg/min     Rosuvastatin Calcium       40 MG Tabs, TAKE 1 TABLET DAILY WITH BREAKFAST FOR HYPERLIPIDEMIA (KEEP UPCOMING APPOINTMENT IN ORDER TO RECEIVE ADDITIONAL REFILLS)     40 mg Oral Daily-1800    Medication List  Visit Diagnoses      Chronic combined systolic and diastolic CHF (congestive heart failure) (HCC)    Shortness of breath    Acute systolic heart failure (HCC)    Dyspnea    Problem List  Level of Service   Level of Service  LOS - NO CHARGE [NC1]  LOS History  All Charges for This Encounter   Code Description Service Date Service Provider Modifiers Qty  [phone] PR NO ASA/ANTIPLAT THER USE RNG 05/12/2017 Burtis Junes, NP  1  [phone] PR NO ASA/ANTIPLAT THER USE RNG 05/12/2017 Burtis Junes, NP  1  [phone] PR CURRENT TOBACCO NON-USER 05/12/2017 Burtis Junes, NP  1  [phone] PR CURRENT TOBACCO NON-USER 05/12/2017 Burtis Junes, NP  1  [phone] PR ELECTROCARDIOGRAM, COMPLETE 05/12/2017 Burtis Junes, NP  1  NC1 LOS - NO CHARGE 05/12/2017 Burtis Junes, NP  1

## 2017-05-12 NOTE — Patient Instructions (Addendum)
We are admitting you to the hospital today.   Call the [facility] office at [phone] if you have any questions, problems or concerns.      

## 2017-05-12 NOTE — Progress Notes (Signed)
Dr Kenton Kingfisher returned page. Updated with patients condition and lab works. Continued to monitor pt.

## 2017-05-12 NOTE — Addendum Note (Signed)
Addended by: Burtis Junes on: 05/12/2017 04:22 PM   Modules accepted: Level of Service

## 2017-05-12 NOTE — Progress Notes (Signed)
CRITICAL VALUE ALERT  Critical Value: trop=.04  Date & Time Notied:  05/29/20218  21:15  Provider Notified: H.Smith  Orders Received/Actions taken: waiting for orders

## 2017-05-12 NOTE — Progress Notes (Signed)
Paged MD regarding pt admission to the unit and no orders at this time  [place]

## 2017-05-12 NOTE — Telephone Encounter (Signed)
Pt's pt, Aaron Berg called in today pt has more recent SOB, sat's in 90-95 AR.  [name] will see pt today.

## 2017-05-13 ENCOUNTER — Observation Stay (HOSPITAL_COMMUNITY): Payer: Medicare Other

## 2017-05-13 DIAGNOSIS — I472 Ventricular tachycardia: Secondary | ICD-10-CM | POA: Diagnosis not present

## 2017-05-13 DIAGNOSIS — I10 Essential (primary) hypertension: Secondary | ICD-10-CM

## 2017-05-13 DIAGNOSIS — I480 Paroxysmal atrial fibrillation: Secondary | ICD-10-CM | POA: Diagnosis present

## 2017-05-13 DIAGNOSIS — F0150 Vascular dementia without behavioral disturbance: Secondary | ICD-10-CM | POA: Diagnosis present

## 2017-05-13 DIAGNOSIS — I4892 Unspecified atrial flutter: Secondary | ICD-10-CM | POA: Diagnosis present

## 2017-05-13 DIAGNOSIS — I255 Ischemic cardiomyopathy: Secondary | ICD-10-CM | POA: Diagnosis not present

## 2017-05-13 DIAGNOSIS — G43909 Migraine, unspecified, not intractable, without status migrainosus: Secondary | ICD-10-CM | POA: Diagnosis present

## 2017-05-13 DIAGNOSIS — I25708 Atherosclerosis of coronary artery bypass graft(s), unspecified, with other forms of angina pectoris: Secondary | ICD-10-CM

## 2017-05-13 DIAGNOSIS — I5041 Acute combined systolic (congestive) and diastolic (congestive) heart failure: Secondary | ICD-10-CM

## 2017-05-13 DIAGNOSIS — I679 Cerebrovascular disease, unspecified: Secondary | ICD-10-CM | POA: Diagnosis present

## 2017-05-13 DIAGNOSIS — R0602 Shortness of breath: Secondary | ICD-10-CM | POA: Diagnosis not present

## 2017-05-13 DIAGNOSIS — I110 Hypertensive heart disease with heart failure: Secondary | ICD-10-CM | POA: Diagnosis present

## 2017-05-13 DIAGNOSIS — E785 Hyperlipidemia, unspecified: Secondary | ICD-10-CM | POA: Diagnosis present

## 2017-05-13 DIAGNOSIS — I080 Rheumatic disorders of both mitral and aortic valves: Secondary | ICD-10-CM | POA: Diagnosis present

## 2017-05-13 DIAGNOSIS — R351 Nocturia: Secondary | ICD-10-CM | POA: Diagnosis present

## 2017-05-13 DIAGNOSIS — G4733 Obstructive sleep apnea (adult) (pediatric): Secondary | ICD-10-CM | POA: Diagnosis present

## 2017-05-13 DIAGNOSIS — I491 Atrial premature depolarization: Secondary | ICD-10-CM | POA: Diagnosis not present

## 2017-05-13 DIAGNOSIS — M545 Low back pain: Secondary | ICD-10-CM | POA: Diagnosis present

## 2017-05-13 DIAGNOSIS — Z7901 Long term (current) use of anticoagulants: Secondary | ICD-10-CM | POA: Diagnosis not present

## 2017-05-13 DIAGNOSIS — I959 Hypotension, unspecified: Secondary | ICD-10-CM | POA: Diagnosis not present

## 2017-05-13 DIAGNOSIS — I2510 Atherosclerotic heart disease of native coronary artery without angina pectoris: Secondary | ICD-10-CM | POA: Diagnosis present

## 2017-05-13 DIAGNOSIS — F329 Major depressive disorder, single episode, unspecified: Secondary | ICD-10-CM | POA: Diagnosis present

## 2017-05-13 DIAGNOSIS — G8929 Other chronic pain: Secondary | ICD-10-CM | POA: Diagnosis present

## 2017-05-13 DIAGNOSIS — R2689 Other abnormalities of gait and mobility: Secondary | ICD-10-CM | POA: Diagnosis present

## 2017-05-13 DIAGNOSIS — I493 Ventricular premature depolarization: Secondary | ICD-10-CM | POA: Diagnosis not present

## 2017-05-13 DIAGNOSIS — N401 Enlarged prostate with lower urinary tract symptoms: Secondary | ICD-10-CM | POA: Diagnosis present

## 2017-05-13 DIAGNOSIS — I5043 Acute on chronic combined systolic (congestive) and diastolic (congestive) heart failure: Secondary | ICD-10-CM | POA: Diagnosis present

## 2017-05-13 DIAGNOSIS — Z951 Presence of aortocoronary bypass graft: Secondary | ICD-10-CM | POA: Diagnosis not present

## 2017-05-13 DIAGNOSIS — J449 Chronic obstructive pulmonary disease, unspecified: Secondary | ICD-10-CM | POA: Diagnosis present

## 2017-05-13 LAB — BASIC METABOLIC PANEL
Anion gap: 9 (ref 5–15)
BUN: 24 mg/dL — ABNORMAL HIGH (ref 6–20)
CALCIUM: 9.1 mg/dL (ref 8.9–10.3)
CHLORIDE: 107 mmol/L (ref 101–111)
CO2: 25 mmol/L (ref 22–32)
CREATININE: 1.25 mg/dL — AB (ref 0.61–1.24)
GFR calc Af Amer: >60 mL/min (ref >60)
GFR, EST NON AFRICAN AMERICAN: 53 mL/min — AB (ref >60)
GLUCOSE: 93 mg/dL (ref 65–99)
POTASSIUM: 3.6 mmol/L (ref 3.5–5.1)
Sodium: 141 mmol/L (ref 135–145)

## 2017-05-13 LAB — CBC
HCT: 41 % (ref 39.0–52.0)
HEMOGLOBIN: 13.7 g/dL (ref 13.0–17.0)
MCH: 30.8 pg (ref 26.0–34.0)
MCHC: 33.4 g/dL (ref 30.0–36.0)
MCV: 92.1 fL (ref 78.0–100.0)
PLATELETS: 201 10*3/uL (ref 150–400)
RBC: 4.45 MIL/uL (ref 4.22–5.81)
RDW: 15.9 % — ABNORMAL HIGH (ref 11.5–15.5)
WBC: 8.2 10*3/uL (ref 4.0–10.5)

## 2017-05-13 MED ORDER — POLYETHYLENE GLYCOL 3350 17 G PO PACK
17.0000 g | PACK | Freq: Every day | ORAL | Status: DC
  Administered 2017-05-13 – 2017-05-15 (×3): 17 g via ORAL
  Filled 2017-05-13 (×3): qty 1

## 2017-05-13 MED ORDER — POTASSIUM CHLORIDE CRYS ER 20 MEQ PO TBCR
20.0000 meq | EXTENDED_RELEASE_TABLET | Freq: Two times a day (BID) | ORAL | Status: DC
  Administered 2017-05-13 – 2017-05-15 (×4): 20 meq via ORAL
  Filled 2017-05-13 (×5): qty 1

## 2017-05-13 NOTE — Discharge Instructions (Signed)

## 2017-05-13 NOTE — Progress Notes (Addendum)
33 o man with history of CAD s/p CABG in 2003, prior cath in 2012 with patent LIMA to LAD, patent SVG to RCA and 50% ostial stenosis of SVG to LCxand EF being 35% at that time. ALso has chronic combined CHF, mild aortic stenosis/mitral regurg by echo in 09/2016, HTN, HLD, pAfib on eliquis, OSA on CPAP, COPD, microvascular dementia, CVA, and falls.   10/08/16: EF 40-45%, grade 2 DD, mild aortic stenosis, mild mitral regurgitation. Outpatient nuc was recommended. This turned out to be high risk. Ended up getting recathed - continued medical management recommended He then presented to the office with his daughter, with tachycardia, and more PND/orthopnea over the weekend and has ben admitted for volume overload.   Subjective:   No acute events overnight. Says he got tangled up in th wires and then was trying to straighten them and felt a little dyspneic. Says orthopnea is better. No chest pain. Says he has been putting out a lot of urine since lasix started.  Objective:  Temp:  [97.8 F (36.6 C)-98.3 F (36.8 C)] 98 F (36.7 C) (05/30 0733) Pulse Rate:  [80-101] 100 (05/30 0733) Resp:  [17-18] 18 (05/30 0733) BP: (96-117)/(68-80) 110/80 (05/30 0733) SpO2:  [97 %-98 %] 98 % (05/30 0733) Weight:  [199 lb (90.3 kg)-203 lb 6.4 oz (92.3 kg)] 199 lb (90.3 kg) (05/30 0733) Weight change:   Intake/Output from previous day: 05/29 0701 - 05/30 0700 In: 480 [P.O.:480] Out: 1540 [Urine:1540]  Intake/Output from this shift: Total I/O In: 697 [P.O.:697] Out: 850 [Urine:850]  Medications: Current Facility-Administered Medications  Medication Dose Route Frequency Provider Last Rate Last Dose  . 0.9 %  sodium chloride infusion  250 mL Intravenous PRN Ahmed Prima, [place] M, PA-C      . 0.9 % NaCl with KCl 40 mEq / L  infusion   Intravenous Continuous Lowella Dandy, MD 50 mL/hr at 05/12/17 2152 50 mL/hr at 05/12/17 2152  . acetaminophen (TYLENOL) tablet 650 mg  650 mg Oral Q4H PRN Strader,  [place] M, PA-C      . apixaban (ELIQUIS) tablet 5 mg  5 mg Oral BID Ahmed Prima, [place] M, PA-C   5 mg at 05/13/17 0830  . budesonide (RHINOCORT AQUA) nasal spray 2 spray  2 spray Each Nare Daily Erma Heritage, PA-C   2 spray at 05/13/17 0831  . donepezil (ARICEPT) tablet 23 mg  23 mg Oral QHS Erma Heritage, PA-C   23 mg at 05/12/17 2206  . DULoxetine (CYMBALTA) DR capsule 30 mg  30 mg Oral Daily Bernerd Pho M, PA-C   30 mg at 05/13/17 0830  . feeding supplement (ENSURE ENLIVE) (ENSURE ENLIVE) liquid 237 mL  237 mL Oral BID BM Strader, [place] M, PA-C   237 mL at 05/13/17 1000  . finasteride (PROSCAR) tablet 5 mg  5 mg Oral Daily Strader, [place] M, PA-C   5 mg at 05/13/17 0830  . folic acid (FOLVITE) tablet 1 mg  1 mg Oral Daily Ahmed Prima, [place] M, PA-C   1 mg at 05/13/17 1749  . furosemide (LASIX) injection 40 mg  40 mg Intravenous Q12H Bernerd Pho M, PA-C   40 mg at 05/13/17 0558  . loratadine (CLARITIN) tablet 10 mg  10 mg Oral Daily Bernerd Pho M, PA-C   10 mg at 05/13/17 0830  . ondansetron (ZOFRAN) injection 4 mg  4 mg Intravenous Q6H PRN Strader, [place] M, PA-C      . rosuvastatin (CRESTOR) tablet 40  mg  40 mg Oral q1800 Erma Heritage, PA-C   40 mg at 05/12/17 2015  . sodium chloride flush (NS) 0.9 % injection 3 mL  3 mL Intravenous Q12H Erma Heritage, PA-C   3 mL at 05/12/17 2207  . sodium chloride flush (NS) 0.9 % injection 3 mL  3 mL Intravenous PRN Strader, [place] M, PA-C      . traMADol Veatrice Bourbon) tablet 50 mg  50 mg Oral Q6H PRN Erma Heritage, PA-C   50 mg at 05/13/17 1093    Physical Exam: General: Elderly male. Alert and and in no acute distress.HR is improved and is between 80-100.  HEENT: Normal.  Neck: Supple, no JVD, carotid bruits, or masses noted.  Cardiac: Irregular irregular rhythm on exam. No murmurs or rubs. Trace pitting edema.  Respiratory: Lungs clear to auscultation  GI: Soft and nontender.  Skin: Warm and  dry. Color is normal.  Neuro: Strength and sensation are intact and no gross focal deficits noted.  Psych: Alert, appropriate and with normal affect.    Lab Results: Results for orders placed or performed during the hospital encounter of 05/12/17 (from the past 48 hour(s))  Brain natriuretic peptide     Status: Abnormal   Collection Time: 05/12/17  7:38 PM  Result Value Ref Range   B Natriuretic Peptide 1,703.1 (H) 0.0 - 100.0 pg/mL  Basic metabolic panel     Status: Abnormal   Collection Time: 05/12/17  7:38 PM  Result Value Ref Range   Sodium 137 135 - 145 mmol/L   Potassium 3.4 (L) 3.5 - 5.1 mmol/L   Chloride 105 101 - 111 mmol/L   CO2 25 22 - 32 mmol/L   Glucose, Bld 152 (H) 65 - 99 mg/dL   BUN 23 (H) 6 - 20 mg/dL   Creatinine, Ser 1.18 0.61 - 1.24 mg/dL   Calcium 9.2 8.9 - 10.3 mg/dL   GFR calc non Af Amer 57 (L) >60 mL/min   GFR calc Af Amer >60 >60 mL/min    Comment: (NOTE) The eGFR has been calculated using the CKD EPI equation. This calculation has not been validated in all clinical situations. eGFR's persistently <60 mL/min signify possible Chronic Kidney Disease.    Anion gap 7 5 - 15  Troponin I     Status: Abnormal   Collection Time: 05/12/17  7:38 PM  Result Value Ref Range   Troponin I 0.04 (HH) <0.03 ng/mL    Comment: CRITICAL RESULT CALLED TO, READ BACK BY AND VERIFIED WITH: R.COLUMBRES,RN 05/12/17 @ [address]   TSH     Status: None   Collection Time: 05/12/17  7:38 PM  Result Value Ref Range   TSH 1.397 0.350 - 4.500 uIU/mL    Comment: Performed by a 3rd Generation assay with a functional sensitivity of <=0.01 uIU/mL.  Basic metabolic panel     Status: Abnormal   Collection Time: 05/13/17  2:20 AM  Result Value Ref Range   Sodium 141 135 - 145 mmol/L   Potassium 3.6 3.5 - 5.1 mmol/L   Chloride 107 101 - 111 mmol/L   CO2 25 22 - 32 mmol/L   Glucose, Bld 93 65 - 99 mg/dL   BUN 24 (H) 6 - 20 mg/dL   Creatinine, Ser 1.25 (H) 0.61 - 1.24  mg/dL   Calcium 9.1 8.9 - 10.3 mg/dL   GFR calc non Af Amer 53 (L) >60 mL/min   GFR calc Af Amer >60 >60 mL/min  Comment: (NOTE) The eGFR has been calculated using the CKD EPI equation. This calculation has not been validated in all clinical situations. eGFR's persistently <60 mL/min signify possible Chronic Kidney Disease.    Anion gap 9 5 - 15   Imaging:  Assessment:  1. Active Problems: 2.   Acute CHF (congestive heart failure) (HCC) 3.   Plan:  1. Acute on chroniccombined systolic and diastolic HF - Pt has high salt intake at home and presented with orthopnea . EKG showed occasional P waves so has intermittent Afib. Also has had 5-6 pound weigh gain since last office visit. Pt is currently on IV lasix 40 mg BID and has diuresed 1.2 L.  Pt is also not on any ACe inhibitor.  SLight increase in creatinine to 1.25.   -continue IV diuresis     2. PAF - not on beta blocker due to prior bradycardia. Will hold on beta blocker at this time and monitor the HR/rhythm on telemetry - further disposition to follow.   3. Known CAD with CABG in 2004 - recent cath from 11/2016 noted - medically managed -Troponin has been mildly up at 0.04 but likely due to volume overload  4. Prior bradycardia now off BB since prior to discharge 10/08/16. Remains on Aricept however.   5. Dementia - on Aricept. Daughter now managing his medicines   Length of Stay:  LOS: 0 days  Saraiya Parth 05/13/2017, 10:40 AM  The patient was seen, examined and discussed with Rhonda Barrett, PA-C and I agree with the above.   81-yo- male with history of CAD s/p CABG in 2003, prior cath in 2012 with patent LIMA to LAD, patent SVG to RCA and 50% ostial stenosis of SVG to LCX, h/o chronic combined CHF, mild aortic stenosis/mitral regurg by echo in 09/2016, HTN, HLD, pAfib on eliquis, OSA on CPAP, COPD, microvascular dementia, CVA, and falls.  10/08/16: EF 40-45%, grade 2 DD, mild aortic stenosis, mild mitral  regurgitation. Outpatient nuc was recommended. This turned out to be high risk. Ended up getting recathed - continued medical management recommended.  He was admitted with acute on chronic combined systolic and diastolic CHF - with LE edema, BNP 1700, he wasn't compliant with good diet. He was started in iv lasix 40 mg iv Q12H from home lasix 20 mg po daily, he diuresed 1 L overnight, we will continue 1 more day, weight 199 lbs, baseline 200 lbs, but still overloaded. Once ready for discharge he might require higher doses of PO lasix. Crea stable, we will monitor closely. BP well controlled.   , MD 05/13/2017    

## 2017-05-13 NOTE — Progress Notes (Signed)
Was called in the patient's room, patient complaining of chestpains pain rate per patient is 1-1/2 /10. Daughter at bedside.  EKG done, v/s checked, Cardiology NP paged made aware.

## 2017-05-13 NOTE — Progress Notes (Signed)
10 beats vtach . Pt asymptomatic no c/o chest pains nor palpitations. Continued to  Monitor closely

## 2017-05-13 NOTE — Care Management Obs Status (Signed)
[facility] NOTIFICATION   Patient Details  Name: ZIMIR KITTLESON MRN: [id number] Date of Birth: [birth year]   Medicare Observation Status Notification Given:  Yes    Erenest Rasher, RN 05/13/2017, 12:39 PM

## 2017-05-13 NOTE — Care Management Note (Signed)
Case Management Note  Patient Details  Name: Aaron Berg MRN: [id number] Date of Birth: [birth year]  Subjective/Objective:       Admitted with CHF            Action/Plan: [id number], Elyse Jarvis, MD; lives at home; has private insurance with Medicare; CM will continue to follow for DCP  Expected Discharge Date:   possibly 05/18/2017               Expected Discharge Plan:  [place]  In-House Referral:   [facility]  Discharge planning Services  CM Consult    Status of Service:  In process, will continue to follow   Sherrilyn Rist [phone] 05/13/2017, 9:56 AM

## 2017-05-14 LAB — BASIC METABOLIC PANEL
ANION GAP: 9 (ref 5–15)
BUN: 22 mg/dL — ABNORMAL HIGH (ref 6–20)
CHLORIDE: 104 mmol/L (ref 101–111)
CO2: 26 mmol/L (ref 22–32)
CREATININE: 1.16 mg/dL (ref 0.61–1.24)
Calcium: 9.1 mg/dL (ref 8.9–10.3)
GFR calc non Af Amer: 58 mL/min — ABNORMAL LOW (ref >60)
Glucose, Bld: 91 mg/dL (ref 65–99)
POTASSIUM: 3.8 mmol/L (ref 3.5–5.1)
Sodium: 139 mmol/L (ref 135–145)

## 2017-05-14 LAB — MAGNESIUM: MAGNESIUM: 1.9 mg/dL (ref 1.7–2.4)

## 2017-05-14 LAB — POTASSIUM: Potassium: 3.9 mmol/L (ref 3.5–5.1)

## 2017-05-14 NOTE — Progress Notes (Signed)
Patient is active with [facility] as prior to admission for HHRN/ PT; Aneta Mins [phone]

## 2017-05-14 NOTE — Plan of Care (Signed)
Problem: Education: Goal: Knowledge of [facility] Education information/materials will improve Outcome: Progressing Pt verbalizes understanding the need for him being on lasix to remove excess fluid. All questions and concerns answered appropriately, pt verbalizes no further questions at this time.   Problem: Safety: Goal: Ability to remain free from injury will improve Outcome: Progressing Bed alarm will remain on throughout the night. Pt is AAOx4 but forgetful to call before getting out of bed. Pt has a steady gait but still experiencing some weakness so will enforce stand by assistance throughout the night.   Problem: Pain Managment: Goal: General experience of comfort will improve Outcome: Progressing Pt has denied any pain. Will continue to monitor any pain or discomfort.

## 2017-05-14 NOTE — Consult Note (Signed)
   [facility] Inpatient Consult   05/14/2017  Aaron Berg May 29, 1936 [id number]   Patient assess for [facility] Care Management needs.  Patient has been assigned HF EMMI and confirms that his primary care provider is Dr. Redmond School. Patient has a HX with [facility] for home health care.  Patient is at [place] and no further needs were verbalized.  For questions, please contact:  Natividad Brood, RN BSN [facility] Liaison  [phone] business mobile phone Toll free office [phone]

## 2017-05-14 NOTE — Plan of Care (Signed)
Problem: Physical Regulation: Goal: Ability to maintain clinical measurements within normal limits will improve Outcome: Progressing Maintains oxygen level on room air   Problem: Activity: Goal: Risk for activity intolerance will decrease Outcome: Progressing Patient relates shortness of breath is much improved   Problem: Fluid Volume: Goal: Ability to maintain a balanced intake and output will improve Outcome: Progressing Continues to diurese with IV lasix   Problem: Cardiac: Goal: Ability to achieve and maintain adequate cardiopulmonary perfusion will improve Outcome: Progressing Maintaining O2 sats in the 90's on room air

## 2017-05-14 NOTE — Progress Notes (Addendum)
Progress Note  Patient Name: Aaron Berg Date of Encounter: 05/14/2017  Primary Cardiologist: Dr Tamala Julian  Subjective   The patient feels significantly better today.  Inpatient Medications    Scheduled Meds: . apixaban  5 mg Oral BID  . budesonide  2 spray Each Nare Daily  . donepezil  23 mg Oral QHS  . DULoxetine  30 mg Oral Daily  . feeding supplement (ENSURE ENLIVE)  237 mL Oral BID BM  . finasteride  5 mg Oral Daily  . folic acid  1 mg Oral Daily  . furosemide  40 mg Intravenous Q12H  . loratadine  10 mg Oral Daily  . polyethylene glycol  17 g Oral Daily  . potassium chloride  20 mEq Oral BID  . rosuvastatin  40 mg Oral q1800  . sodium chloride flush  3 mL Intravenous Q12H   Continuous Infusions: . sodium chloride     PRN Meds: sodium chloride, acetaminophen, ondansetron (ZOFRAN) IV, sodium chloride flush, traMADol   Vital Signs    Vitals:   05/13/17 1500 05/13/17 1502 05/13/17 2100 05/14/17 0425  BP: 90/70 96/66 110/86 122/88  Pulse: (!) 106 89 91 76  Resp:   20 20  Temp:   98.2 F (36.8 C) 98.3 F (36.8 C)  TempSrc:   Oral Oral  SpO2:  97% 94% 94%  Weight:    198 lb (89.8 kg)  Height:        Intake/Output Summary (Last 24 hours) at 05/14/17 0857 Last data filed at 05/14/17 0852  Gross per 24 hour  Intake          1880.67 ml  Output             4330 ml  Net         -2449.33 ml   Filed Weights   05/12/17 1658 05/13/17 0733 05/14/17 0425  Weight: 203 lb 6.4 oz (92.3 kg) 199 lb (90.3 kg) 198 lb (89.8 kg)    Telemetry    SR - Personally Reviewed  ECG    SR - Personally Reviewed  Physical Exam   GEN: No acute distress.   Neck: No JVD Cardiac: RRR, no murmurs, rubs, or gallops.  Respiratory: mild crackles bilaterally. GI: Soft, nontender, non-distended  MS: minimal LE edema; No deformity. Neuro:  Nonfocal  Psych: Normal affect   Labs    Chemistry Recent Labs Lab 05/12/17 1938 05/13/17 0220 05/14/17 0342  NA 137 141 139  K  3.4* 3.6 3.8  CL 105 107 104  CO2 25 25 26   GLUCOSE 152* 93 91  BUN 23* 24* 22*  CREATININE 1.18 1.25* 1.16  CALCIUM 9.2 9.1 9.1  GFRNONAA 57* 53* 58*  GFRAA >60 >60 >60  ANIONGAP 7 9 9      Hematology Recent Labs Lab 05/13/17 1147  WBC 8.2  RBC 4.45  HGB 13.7  HCT 41.0  MCV 92.1  MCH 30.8  MCHC 33.4  RDW 15.9*  PLT 201    Cardiac Enzymes Recent Labs Lab 05/12/17 1938  TROPONINI 0.04*   No results for input(s): TROPIPOC in the last 168 hours.   BNP Recent Labs Lab 05/12/17 1938  BNP 1,703.1*     DDimer No results for input(s): DDIMER in the last 168 hours.   Radiology    Dg Chest 2 View  Result Date: 05/13/2017 CLINICAL DATA:  Shortness of breath. History of hypertension, COPD, stroke and a-flutter. EXAM: CHEST  2 VIEW COMPARISON:  10/06/2016; 06/30/2014 FINDINGS:  Unchanged enlarged cardiac silhouette and mediastinal contours post median sternotomy and CABG. Atherosclerotic plaque within the thoracic aorta. Pulmonary vasculature appears slightly less distinct on the present examination with cephalization flow. Interval development of an a small left and trace right-sided pleural effusion with worsening bilateral infrahilar opacities. No pneumothorax. No acute osseus abnormalities. Old/healed right 6th posterolateral rib fracture. Post right hemi humeral arthroplasty. IMPRESSION: 1. Suspected mild pulmonary edema with development of small left and trace right-sided effusions with associated worsening bibasilar opacities, atelectasis versus infiltrate. A follow-up chest radiograph in 3 to 4 weeks after treatment is recommended to ensure resolution. 2.  Aortic Atherosclerosis (ICD10-I70.0). Electronically Signed   By: Sandi Mariscal M.D.   On: 05/13/2017 08:30    Cardiac Studies     Patient Profile     81 y.o. male   Assessment & Plan    1. Acute on chroniccombined systolic and diastolic HF - Pt has high salt intake at home and presented with orthopnea . EKG  showed occasional P waves so has intermittent Afib. Also has had 5-6 pound weigh gain since last office visit. Pt is currently on IV lasix 40 mg BID and has diuresed 4.6 L in 2 days.  Creatinine stable at 1.16.  -continue IV diuresis tonight, switch to 40 mg po daily tomorrow and discharge, new dry weight should be 198 lbs, he will be advised to take an extra 20 mg po in the afternoon if he gains 3 lbs overnight or 5 lbs in 1 week.   2. PAF - not on beta blocker due to prior bradycardia. Will hold on beta blocker at this time and monitor the HR/rhythm on telemetry - further disposition to follow.   3. Known CAD with CABG in 2004 - recent cath from 11/2016 noted - medically managed -Troponin has been mildly up at 0.04 but likely due to volume overload  4. Prior bradycardia now off BB since prior to discharge 10/08/16. Remains on Aricept however.    5. Dementia - on Aricept. Daughter now managing his medicines   Anticipated discharge tomorrow.   Signed, Ena Dawley, MD  05/14/2017, 8:57 AM

## 2017-05-14 NOTE — Progress Notes (Signed)
Notified by CCMD of 37 beat run of V tach, RN in room with patient at time, patient asymptomatic.  HR back in 80's when RN looked at monitor.  NP covering for cards notified, orders received.

## 2017-05-14 NOTE — Progress Notes (Signed)
Patient w/o complaint on 7 a to 7 p shift, VSS.  Patient anticipating discharge tomorrow.  Feels much improved, very mild intermittent shortness of breath per patient.  Continues to diurese.

## 2017-05-15 DIAGNOSIS — I472 Ventricular tachycardia: Secondary | ICD-10-CM

## 2017-05-15 DIAGNOSIS — I255 Ischemic cardiomyopathy: Secondary | ICD-10-CM

## 2017-05-15 LAB — BASIC METABOLIC PANEL
Anion gap: 9 (ref 5–15)
BUN: 23 mg/dL — AB (ref 6–20)
CALCIUM: 9.2 mg/dL (ref 8.9–10.3)
CHLORIDE: 104 mmol/L (ref 101–111)
CO2: 27 mmol/L (ref 22–32)
CREATININE: 1.22 mg/dL (ref 0.61–1.24)
GFR calc Af Amer: >60 mL/min (ref >60)
GFR calc non Af Amer: 55 mL/min — ABNORMAL LOW (ref >60)
Glucose, Bld: 91 mg/dL (ref 65–99)
Potassium: 3.7 mmol/L (ref 3.5–5.1)
SODIUM: 140 mmol/L (ref 135–145)

## 2017-05-15 MED ORDER — POTASSIUM CHLORIDE CRYS ER 20 MEQ PO TBCR: 20.0000 meq | EXTENDED_RELEASE_TABLET | Freq: Every day | ORAL | 3 refills | Status: DC

## 2017-05-15 MED ORDER — FUROSEMIDE 40 MG PO TABS: 40.0000 mg | ORAL_TABLET | Freq: Every day | ORAL | Status: DC

## 2017-05-15 MED ORDER — FUROSEMIDE 40 MG PO TABS: 40.0000 mg | ORAL_TABLET | Freq: Every day | ORAL | 5 refills | Status: DC

## 2017-05-15 MED ORDER — AMIODARONE HCL 200 MG PO TABS: 200.0000 mg | ORAL_TABLET | Freq: Every day | ORAL | 5 refills | Status: DC

## 2017-05-15 MED ORDER — AMIODARONE HCL 200 MG PO TABS
200.0000 mg | ORAL_TABLET | Freq: Every day | ORAL | Status: DC
  Administered 2017-05-15: 200 mg via ORAL
  Filled 2017-05-15: qty 1

## 2017-05-15 NOTE — Discharge Summary (Signed)
Discharge Summary    Patient ID: Aaron Berg,  MRN: [id number], DOB/AGE: Jul 17, 1936 81 y.o.  Admit date: 05/12/2017 Discharge date: 05/15/2017  Primary Care Provider: [name] Primary Cardiologist: Dr. Tamala Julian  Discharge Diagnoses    Active Problems:   Acute CHF (congestive heart failure) (HCC)   Allergies Allergies  Allergen Reactions  . Procardia [Nifedipine] Rash    All over body    Diagnostic Studies/Procedures    None _____________   History of Present Illness     Aaron Berg is a 81 o man with history of CAD s/p CABG in 2003, prior cath in 2012 with patent LIMA to LAD, patent SVG to RCA and 50% ostial stenosis of SVG to LCxand EF being 35% at that time. ALso has chronic combined CHF, mild aortic stenosis/mitral regurg by echo in 09/2016, HTN, HLD, pAfib on eliquis, OSA on CPAP, COPD, microvascular dementia, CVA, and falls who was seen in the office for increased DOE, orthopnea and swelling.   Hospital Course     Consultants: None  1. Acute on chroniccombined systolic and diastolic HF -Hx EF 40-98% per echo 09/2016 - Pt has high salt intake at home and presented with orthopnea . EKG showed occasional P waves so has intermittent Afib. Also has had 5-6 pound weigh gain since last office visit. Pt is currently on IV lasix 40 mg BID and has diuresed 6 L. Wt down 8 pounds since admission.  -Breathing and edema are much better, but pt no quite at his baseline. Is able to lie flat to sleep.  -Creatinine stable at 1.22.  -Switched to Lasix 40 mg po daily. (got IV dose this am) he will be advised to take an extra 20 mg po in the afternoon if he gains 3 lbs overnight or 5 lbs in 1 week.  -new dry weight should be 198 lbs -Discussed with pt limiting sodium, monitoring swelling in feet and abdomen, breathing and orthopnea. Daily wt and report wt gain 3 pounds in a day or 5 pounds in a week.   2. PAF  - not on beta blocker due to prior bradycardia.- Pt is having  frequent ectopy, ?NSVT vs abherant rhythm. Will start Amiodarone 200 mg daily. -Anticoagulated with Eliquis 5 mg bid -Electrolytes checked, OK  3. Known CAD with CABG in 2004  - recent cath from 11/2016 noted - medically managed  -Troponin has been mildly up at 0.04 but likely due to volume overload   4. Prior bradycardia now off BB since prior to discharge 10/08/16. Remains on Aricept however.   5. Dementia - on Aricept. Daughter now managing his medicines   Plan for discharge today with follow up in 1 week to reevaluate volume status. He is advised to use compression stockings at home.   Patient has been seen by Dr. Meda Coffee today and deemed ready for discharge home. All follow up appointments have been scheduled. Discharge medications are listed below. _____________  Discharge Vitals Blood pressure 95/75, pulse 60, temperature 97.8 F (36.6 C), temperature source Oral, resp. rate 16, height 5\' 10"  (1.778 m), weight 195 lb (88.5 kg), SpO2 93 %.  Filed Weights   05/13/17 0733 05/14/17 0425 05/15/17 0622  Weight: 199 lb (90.3 kg) 198 lb (89.8 kg) 195 lb (88.5 kg)    Labs & Radiologic Studies    CBC  Recent Labs  05/13/17 1147  WBC 8.2  HGB 13.7  HCT 41.0  MCV 92.1  PLT 201  Basic Metabolic Panel  Recent Labs  05/14/17 0342 05/14/17 2019 05/15/17 0355  NA 139  --  140  K 3.8 3.9 3.7  CL 104  --  104  CO2 26  --  27  GLUCOSE 91  --  91  BUN 22*  --  23*  CREATININE 1.16  --  1.22  CALCIUM 9.1  --  9.2  MG  --  1.9  --    Liver Function Tests No results for input(s): AST, ALT, ALKPHOS, BILITOT, PROT, ALBUMIN in the last 72 hours. No results for input(s): LIPASE, AMYLASE in the last 72 hours. Cardiac Enzymes  Recent Labs  05/12/17 1938  TROPONINI 0.04*   BNP Invalid input(s): POCBNP D-Dimer No results for input(s): DDIMER in the last 72 hours. Hemoglobin A1C No results for input(s): HGBA1C in the last 72 hours. Fasting Lipid Panel No results  for input(s): CHOL, HDL, LDLCALC, TRIG, CHOLHDL, LDLDIRECT in the last 72 hours. Thyroid Function Tests  Recent Labs  05/12/17 1938  TSH 1.397   _____________  Dg Chest 2 View  Result Date: 05/13/2017 CLINICAL DATA:  Shortness of breath. History of hypertension, COPD, stroke and a-flutter. EXAM: CHEST  2 VIEW COMPARISON:  10/06/2016; 06/30/2014 FINDINGS: Unchanged enlarged cardiac silhouette and mediastinal contours post median sternotomy and CABG. Atherosclerotic plaque within the thoracic aorta. Pulmonary vasculature appears slightly less distinct on the present examination with cephalization flow. Interval development of an a small left and trace right-sided pleural effusion with worsening bilateral infrahilar opacities. No pneumothorax. No acute osseus abnormalities. Old/healed right 6th posterolateral rib fracture. Post right hemi humeral arthroplasty. IMPRESSION: 1. Suspected mild pulmonary edema with development of small left and trace right-sided effusions with associated worsening bibasilar opacities, atelectasis versus infiltrate. A follow-up chest radiograph in 3 to 4 weeks after treatment is recommended to ensure resolution. 2.  Aortic Atherosclerosis (ICD10-I70.0). Electronically Signed   By: Sandi Mariscal M.D.   On: 05/13/2017 08:30   Disposition   Pt is being discharged home today in good condition.  Follow-up Plans & Appointments    Follow-up Information    Isaiah Serge, NP Follow up.   Specialties:  Cardiology, Radiology Why:  on June 11 at 11:00 for cardiology hospital follow up.  Contact information: [address] [place] [address] [phone]          Discharge Instructions    (HEART FAILURE PATIENTS) Call MD:  Anytime you have any of the following symptoms: 1) 3 pound weight gain in 24 hours or 5 pounds in 1 week 2) shortness of breath, with or without a dry hacking cough 3) swelling in the hands, feet or stomach 4) if you have to sleep on extra  pillows at night in order to breathe.    Complete by:  As directed    Diet - low sodium heart healthy    Complete by:  As directed    Discharge instructions    Complete by:  As directed    New target weight is 198 pounds   Heart Failure patients record your daily weight using the same scale at the same time of day    Complete by:  As directed    Increase activity slowly    Complete by:  As directed       Discharge Medications   Current Discharge Medication List    START taking these medications   Details  amiodarone (PACERONE) 200 MG tablet Take 1 tablet (200 mg  total) by mouth daily. Qty: 30 tablet, Refills: 5    potassium chloride SA (K-DUR,KLOR-CON) 20 MEQ tablet Take 1 tablet (20 mEq total) by mouth daily. Qty: 30 tablet, Refills: 3      CONTINUE these medications which have CHANGED   Details  furosemide (LASIX) 40 MG tablet Take 1 tablet (40 mg total) by mouth daily. Qty: 30 tablet, Refills: 5      CONTINUE these medications which have NOT CHANGED   Details  apixaban (ELIQUIS) 5 MG TABS tablet Take 1 tablet (5 mg total) by mouth 2 (two) times daily. Qty: 180 tablet, Refills: 3    Budesonide (RHINOCORT ALLERGY NA) Place 1 spray into the nose daily.    cetirizine (ZYRTEC) 10 MG tablet Take 10 mg by mouth daily.    donepezil (ARICEPT) 23 MG TABS tablet TAKE 1 TABLET AT BEDTIME Qty: 90 tablet, Refills: 1    DULoxetine (CYMBALTA) 30 MG capsule Take 1 capsule (30 mg total) by mouth daily. Qty: 30 capsule, Refills: 3   Associated Diagnoses: Major depressive disorder with single episode, remission status unspecified    feeding supplement, ENSURE ENLIVE, (ENSURE ENLIVE) LIQD Take 237 mLs by mouth 2 (two) times daily between meals. Qty: 237 mL, Refills: 12    finasteride (PROSCAR) 5 MG tablet Take 1 tablet (5 mg total) by mouth daily. Qty: 90 tablet, Refills: 3   Associated Diagnoses: BPH associated with nocturia    Multiple Vitamins-Minerals (MULTIVITAMIN WITH  MINERALS) tablet Take 1 tablet by mouth daily. Reported on 01/30/2016    rosuvastatin (CRESTOR) 40 MG tablet TAKE 1 TABLET DAILY WITH BREAKFAST FOR HYPERLIPIDEMIA (KEEP UPCOMING APPOINTMENT IN ORDER TO RECEIVE ADDITIONAL REFILLS) Qty: 90 tablet, Refills: 3    sodium chloride (OCEAN) 0.65 % SOLN nasal spray Place 1 spray into both nostrils daily as needed for congestion.     Testosterone 20.25 MG/1.25GM (1.62%) GEL Apply 2 Squirts topically daily as needed (occasionally). One pump per shoulder.    traMADol (ULTRAM) 50 MG tablet Take 1 tablet (50 mg total) by mouth every 8 (eight) hours as needed. Qty: 50 tablet, Refills: 1    folic acid (FOLVITE) 1 MG tablet Take 1 tablet (1 mg total) by mouth daily. Qty: 30 tablet, Refills: 1    nitroGLYCERIN (NITROSTAT) 0.4 MG SL tablet Place 1 tablet (0.4 mg total) under the tongue every 5 (five) minutes as needed for chest pain. Qty: 25 tablet, Refills: 12          Outstanding Labs/Studies   None  Duration of Discharge Encounter   Greater than 30 minutes including physician time.  Signed, Daune Perch NP 05/15/2017, 3:22 PM

## 2017-05-15 NOTE — Progress Notes (Signed)
Discharge to home, ( [place]), Patient stated thather daughter cannot take him home and we have to call a cab. This RN called the daughter to clarify and the daughter said "Yes he is going home thru cab, he has money"  Home address was also clarified. [employer] has been called for transport. Discharge instructions and follow up appointment done and was given to the patient, verbalized understanding.

## 2017-05-15 NOTE — Progress Notes (Addendum)
Progress Note  Patient Name: Aaron Berg Date of Encounter: 05/15/2017  Primary Cardiologist: Dr Tamala Julian  Subjective   Patient feeling "great"  Inpatient Medications    Scheduled Meds: . apixaban  5 mg Oral BID  . budesonide  2 spray Each Nare Daily  . donepezil  23 mg Oral QHS  . DULoxetine  30 mg Oral Daily  . feeding supplement (ENSURE ENLIVE)  237 mL Oral BID BM  . finasteride  5 mg Oral Daily  . folic acid  1 mg Oral Daily  . furosemide  40 mg Intravenous Q12H  . loratadine  10 mg Oral Daily  . polyethylene glycol  17 g Oral Daily  . potassium chloride  20 mEq Oral BID  . rosuvastatin  40 mg Oral q1800  . sodium chloride flush  3 mL Intravenous Q12H   Continuous Infusions: . sodium chloride     PRN Meds: sodium chloride, acetaminophen, ondansetron (ZOFRAN) IV, sodium chloride flush, traMADol   Vital Signs    Vitals:   05/14/17 1212 05/14/17 1816 05/14/17 2042 05/15/17 0622  BP: (!) 88/56 103/72 109/89 95/75  Pulse: 88 87 (!) 112 60  Resp: 18  18 16   Temp: 97.7 F (36.5 C)  98.4 F (36.9 C) 97.8 F (36.6 C)  TempSrc: Oral  Oral Oral  SpO2: 95% 96% 96% 93%  Weight:    195 lb (88.5 kg)  Height:        Intake/Output Summary (Last 24 hours) at 05/15/17 1249 Last data filed at 05/15/17 0800  Gross per 24 hour  Intake              243 ml  Output             2730 ml  Net            -2487 ml   Filed Weights   05/13/17 0733 05/14/17 0425 05/15/17 0622  Weight: 199 lb (90.3 kg) 198 lb (89.8 kg) 195 lb (88.5 kg)    Telemetry    SR with intermittent afib and PVC's, rates 90's-low 100's - Personally Reviewed  ECG    No new tracings  Physical Exam   GEN: No acute distress.   Neck: No JVD Cardiac: RRR, 2/6 systolic murmur in left chest, No rubs or gallops.  Respiratory: mild crackles bilaterally. GI: Soft, nontender, non-distended  MS: No LE edema; No deformity. Neuro:  Nonfocal  Psych: Normal affect   Labs    Chemistry  Recent Labs Lab  05/13/17 0220 05/14/17 0342 05/14/17 2019 05/15/17 0355  NA 141 139  --  140  K 3.6 3.8 3.9 3.7  CL 107 104  --  104  CO2 25 26  --  27  GLUCOSE 93 91  --  91  BUN 24* 22*  --  23*  CREATININE 1.25* 1.16  --  1.22  CALCIUM 9.1 9.1  --  9.2  GFRNONAA 53* 58*  --  55*  GFRAA >60 >60  --  >60  ANIONGAP 9 9  --  9     Hematology  Recent Labs Lab 05/13/17 1147  WBC 8.2  RBC 4.45  HGB 13.7  HCT 41.0  MCV 92.1  MCH 30.8  MCHC 33.4  RDW 15.9*  PLT 201    Cardiac Enzymes  Recent Labs Lab 05/12/17 1938  TROPONINI 0.04*   No results for input(s): TROPIPOC in the last 168 hours.   BNP  Recent Labs Lab 05/12/17 1938  BNP 1,703.1*     DDimer No results for input(s): DDIMER in the last 168 hours.   Radiology    No results found.  Cardiac Studies   None  Patient Profile     81 y.o. male   Assessment & Plan    1. Acute on chroniccombined systolic and diastolic HF -Hx EF 56-38% per echo 09/2016 - Pt has high salt intake at home and presented with orthopnea . EKG showed occasional P waves so has intermittent Afib. Also has had 5-6 pound weigh gain since last office visit. Pt is currently on IV lasix 40 mg BID and has diuresed 6 L. Wt down 8 pounds since admission.  -Creatinine stable at 1.22.  -Switched to Lasix 40 mg po daily. (got IV dose this am) he will be advised to take an extra 20 mg po in the afternoon if he gains 3 lbs overnight or 5 lbs in 1 week.  -new dry weight should be 198 lbs -Discussed with pt limiting sodium, monitoring swelling in feet and abdomen, breathing and orthopnea. Daily wt and reoprt wt gain 3 pounds in a day or 5 pounds in a week.   2. PAF  - not on beta blocker due to prior bradycardia.- Pt is having frequent ectopy, ?NSVT vs abherant rhythm. Will start Amiodarone 200 mg daily. -Anticoagulated with Eliquis 5 mg bid -Electrolytes checked, OK  3. Known CAD with CABG in 2004  - recent cath from 11/2016 noted - medically managed    -Troponin has been mildly up at 0.04 but likely due to volume overload   4. Prior bradycardia now off BB since prior to discharge 10/08/16. Remains on Aricept however.   5. Dementia - on Aricept. Daughter now managing his medicines   Plan for discharge today.   Signed, Daune Perch, NP  05/15/2017, 12:49 PM    The patient was seen, examined and discussed with Daune Perch, NP-C and I agree with the above.   Diureses 4.6 litres in 2 days, now down to 195 lbs, his new baseline, discharge lasix 40 mg po daily, he will be advised to take an extra 20 mg po in the afternoon if he gains 3 lbs overnight or 5 lbs in 1 week.  He  Has significant ectopy - both PACs and PVCs on telemetry and asymptomatic runs of nsVT - up to 17 beats. He is hypotensive and has h/o bradycardia on betablockers. I will start amiodarone 200 mg po daily. We will follow up in 1 week to reevaluate his volume status. He is advised to use compression stockings at home.   Ena Dawley, MD 05/15/2017

## 2017-05-16 DIAGNOSIS — R296 Repeated falls: Secondary | ICD-10-CM | POA: Diagnosis not present

## 2017-05-16 DIAGNOSIS — F329 Major depressive disorder, single episode, unspecified: Secondary | ICD-10-CM | POA: Diagnosis not present

## 2017-05-18 ENCOUNTER — Telehealth: Payer: Self-pay | Admitting: Interventional Cardiology

## 2017-05-18 DIAGNOSIS — F329 Major depressive disorder, single episode, unspecified: Secondary | ICD-10-CM | POA: Diagnosis not present

## 2017-05-18 DIAGNOSIS — R296 Repeated falls: Secondary | ICD-10-CM | POA: Diagnosis not present

## 2017-05-18 NOTE — Telephone Encounter (Signed)
Aaron Berg, from [facility], was inquiring about lab work that they were instructed to draw.  Informed her that I cannot see where cardiology ordered any lab work post hospital follow up and advised to contact PCP or [facility] ordering provider to discuss labs needed for patient.   She thanks me for helping.

## 2017-05-18 NOTE — Telephone Encounter (Signed)
Noelle, a nurse with [facility] calling in regards to nursing orders. Thanks.

## 2017-05-19 DIAGNOSIS — F329 Major depressive disorder, single episode, unspecified: Secondary | ICD-10-CM | POA: Diagnosis not present

## 2017-05-19 DIAGNOSIS — R296 Repeated falls: Secondary | ICD-10-CM | POA: Diagnosis not present

## 2017-05-20 DIAGNOSIS — F329 Major depressive disorder, single episode, unspecified: Secondary | ICD-10-CM | POA: Diagnosis not present

## 2017-05-20 DIAGNOSIS — R296 Repeated falls: Secondary | ICD-10-CM | POA: Diagnosis not present

## 2017-05-21 ENCOUNTER — Ambulatory Visit (INDEPENDENT_AMBULATORY_CARE_PROVIDER_SITE_OTHER): Payer: Medicare Other | Admitting: Family Medicine

## 2017-05-21 ENCOUNTER — Encounter: Payer: Self-pay | Admitting: Family Medicine

## 2017-05-21 VITALS — BP 90/50 | HR 65 | Ht 70.0 in | Wt 191.0 lb

## 2017-05-21 DIAGNOSIS — F0280 Dementia in other diseases classified elsewhere without behavioral disturbance: Secondary | ICD-10-CM

## 2017-05-21 DIAGNOSIS — R351 Nocturia: Secondary | ICD-10-CM

## 2017-05-21 DIAGNOSIS — N401 Enlarged prostate with lower urinary tract symptoms: Secondary | ICD-10-CM

## 2017-05-21 DIAGNOSIS — I5042 Chronic combined systolic (congestive) and diastolic (congestive) heart failure: Secondary | ICD-10-CM

## 2017-05-21 DIAGNOSIS — Z7901 Long term (current) use of anticoagulants: Secondary | ICD-10-CM | POA: Diagnosis not present

## 2017-05-21 DIAGNOSIS — I10 Essential (primary) hypertension: Secondary | ICD-10-CM | POA: Diagnosis not present

## 2017-05-21 DIAGNOSIS — G301 Alzheimer's disease with late onset: Secondary | ICD-10-CM | POA: Diagnosis not present

## 2017-05-21 DIAGNOSIS — I480 Paroxysmal atrial fibrillation: Secondary | ICD-10-CM

## 2017-05-21 DIAGNOSIS — J309 Allergic rhinitis, unspecified: Secondary | ICD-10-CM

## 2017-05-21 DIAGNOSIS — I2510 Atherosclerotic heart disease of native coronary artery without angina pectoris: Secondary | ICD-10-CM

## 2017-05-21 DIAGNOSIS — R3914 Feeling of incomplete bladder emptying: Secondary | ICD-10-CM

## 2017-05-21 LAB — COMPREHENSIVE METABOLIC PANEL
ALBUMIN: 3.7 g/dL (ref 3.6–5.1)
ALT: 13 U/L (ref 9–46)
AST: 20 U/L (ref 10–35)
Alkaline Phosphatase: 55 U/L (ref 40–115)
BUN: 23 mg/dL (ref 7–25)
CHLORIDE: 104 mmol/L (ref 98–110)
CO2: 23 mmol/L (ref 20–31)
CREATININE: 1.2 mg/dL — AB (ref 0.70–1.18)
Calcium: 9.5 mg/dL (ref 8.6–10.3)
Glucose, Bld: 90 mg/dL (ref 65–99)
POTASSIUM: 4.2 mmol/L (ref 3.5–5.3)
SODIUM: 137 mmol/L (ref 135–146)
Total Bilirubin: 1 mg/dL (ref 0.2–1.2)
Total Protein: 6.5 g/dL (ref 6.1–8.1)

## 2017-05-21 LAB — CBC WITH DIFFERENTIAL/PLATELET
Basophils Absolute: 0 cells/uL (ref 0–200)
Basophils Relative: 0 %
EOS ABS: 130 {cells}/uL (ref 15–500)
Eosinophils Relative: 2 %
HCT: 42.4 % (ref 38.5–50.0)
Hemoglobin: 14.1 g/dL (ref 13.2–17.1)
LYMPHS ABS: 1300 {cells}/uL (ref 850–3900)
LYMPHS PCT: 20 %
MCH: 30.2 pg (ref 27.0–33.0)
MCHC: 33.3 g/dL (ref 32.0–36.0)
MCV: 90.8 fL (ref 80.0–100.0)
MONO ABS: 650 {cells}/uL (ref 200–950)
MPV: 12.4 fL (ref 7.5–12.5)
Monocytes Relative: 10 %
NEUTROS ABS: 4420 {cells}/uL (ref 1500–7800)
Neutrophils Relative %: 68 %
PLATELETS: 158 10*3/uL (ref 140–400)
RBC: 4.67 MIL/uL (ref 4.20–5.80)
RDW: 15.1 % — ABNORMAL HIGH (ref 11.0–15.0)
WBC: 6.5 10*3/uL (ref 4.0–10.5)

## 2017-05-21 MED ORDER — FINASTERIDE 5 MG PO TABS: 5.0000 mg | ORAL_TABLET | Freq: Every day | ORAL | 3 refills | Status: DC

## 2017-05-21 NOTE — Progress Notes (Signed)
Subjective:   HPI  Aaron Berg is a 81 y.o. male who presents for Chief Complaint  Patient presents with  . Medicare Wellness    med check plus    Medical care team includes: [name], MD here for primary care  Dr.Smith Dr.Dohmeier    Preventative care:  Last ophthalmology visit: Oct 5,17 Last dental visit: 06/10/17 Last colonoscopy: N/A 20 years ago Last prostate exam: N/A Last EKG: 5/30/182Last labs:05/15/17  Prior vaccinations:  TD or Tdap: 01/20/13 Influenza: 09/03/16 Pneumococcal:23 01/20/13 13 01/09/16 Shingles/Zostavax:Instructed to get Shingrix  advanced directive: In the system Health care power of attorney: Daughter   Concerns: He was recently hospitalized and treated for acute CHF. He does have underlying ASHD. While in the hospital he was in and out of A. fib and when the CHF cleared up his A. fib did get much better. Presently he is on amiodarone as well as Eliquis. He has been using Lasix however he has cut back on the urination. He states his weight actually has gone down since being at home. He no longer has any ankle edema. He does complain of slight shortness of breath with exercise. No chest pain or PND. He will need a refill on finasteride he does have underlying allergies and is using Rhinocort as well as Zyrtec.  Reviewed their medical, surgical, family, social, medication, and allergy history and updated chart as appropriate.  Past Medical History:  Diagnosis Date  . Aortic stenosis   . Arthritis    "thumbs, wrists, legs, spine" (05/12/2017)  . Atrial flutter ([place])   . Benign prostatic hyperplasia (BPH) with urinary urgency   . CAD (coronary artery disease)    a. s/p CABG 2003.  [name] Cerebrovascular disease 2006   multiple ischemic changes on prior MRI  . Chronic combined systolic and diastolic CHF (congestive heart failure) ([place])   . Chronic lower back pain   . Complication of anesthesia    "woke up too soon once; had 2nd stroke after right  knee replacement"  . COPD (chronic obstructive pulmonary disease) ([place])    "no signs or tests"  . CVA (cerebral vascular accident) ([place]) late 1990s; 01/2014  . Dementia   . Depression   . Dry eyes   . Dyslipidemia    well controlled  . Gait disorder   . Heart murmur    "found in ~ 1946"  . Hypertension   . Hypertensive cardiovascular disease   . Male hypogonadism   . Migraine    "major one when I was a kid; very painful"  . Mitral regurgitation   . Multiple falls    "after both knees surgeries; went to rehab; came out and fell all the time; I'm much better now" (05/12/2017)  . Obesity   . OSA on CPAP    cpap  . Paroxysmal atrial fibrillation (HCC)   . Skin cancer of anterior chest   . Spinal stenosis     Past Surgical History:  Procedure Laterality Date  . APPENDECTOMY  as child  . CARDIAC CATHETERIZATION  11/2003   Archie Endo 04/29/2011  . CARDIAC CATHETERIZATION N/A 12/03/2016   Procedure: Left Heart Cath and Cors/Grafts Angiography;  Surgeon: Belva Crome, MD;  Location: [place];  Service: Cardiovascular;  Laterality: N/A;  . CARDIOVERSION N/A 04/26/2014   Procedure: CARDIOVERSION;  Surgeon: Sinclair Grooms, MD;  Location: [facility] ENDOSCOPY;  Service: Cardiovascular;  Laterality: N/A;  . CARDIOVERSION N/A 05/24/2014   Procedure: CARDIOVERSION;  Surgeon: Sinclair Grooms, MD;  Location[facility] ENDOSCOPY;  Service: Cardiovascular;  Laterality: N/A;  . CATARACT EXTRACTION W/ INTRAOCULAR LENS  IMPLANT, BILATERAL Bilateral 2000s  . CORONARY ANGIOPLASTY  1991   "no stents" (10/06/2016)  . CORONARY ARTERY BYPASS GRAFT  11/2002   X 3/notes 04/29/2011  . CYST EXCISION     lumbar; Dr. Joya Salm  . EYE SURGERY Right    detached retina  . JOINT REPLACEMENT    . MOHS SURGERY     on chest  . RETINAL DETACHMENT SURGERY Right   . SHOULDER SURGERY Right    with murphy, reattach ligaments  . TONSILLECTOMY  as child  . TOTAL KNEE ARTHROPLASTY Right 01/02/2014   Procedure: RIGHT TOTAL  KNEE ARTHROPLASTY;  Surgeon: Mauri Pole, MD;  Location: [facility];  Service: Orthopedics;  Laterality: Right;  . TOTAL KNEE ARTHROPLASTY Left 01/31/2014   Procedure: LEFT TOTAL KNEE ARTHROPLASTY;  Surgeon: Mauri Pole, MD;  Location: [facility];  Service: Orthopedics;  Laterality: Left;  . TOTAL SHOULDER ARTHROPLASTY Right   . VIDEO ASSISTED THORACOSCOPY (VATS)/THOROCOTOMY  2003   benign nodule    Social History   Social History  . Marital status: Divorced    Spouse name: N/A  . Number of children: 2  . Years of education: Masters   Occupational History  .      retired   Social History Main Topics  . Smoking status: Former Smoker    Packs/day: 2.00    Years: 33.00    Types: Cigarettes    Quit date: 06/17/1989  . Smokeless tobacco: Never Used  . Alcohol use 4.8 oz/week    8 Shots of liquor per week     Comment: 05/12/2017 "bourbon"  . Drug use: No  . Sexual activity: Not Currently   Other Topics Concern  . Not on file   Social History Narrative   Patient is single and lives alone.   Patient is retired.   Patient has a Scientist, water quality.   Patient has two adult children.   Patient is right-handed.   Patient drinks one cup of coffee daily.      Pt lives in [place] in his RV alone.   Retired Engineer, civil (consulting)    Family History  Problem Relation Age of Onset  . Pancreatic cancer Father   . CAD Father   . CAD Mother   . Arthritis Sister   . Healthy Sister      Current Outpatient Prescriptions:  .  amiodarone (PACERONE) 200 MG tablet, Take 1 tablet (200 mg total) by mouth daily., Disp: 30 tablet, Rfl: 5 .  apixaban (ELIQUIS) 5 MG TABS tablet, Take 1 tablet (5 mg total) by mouth 2 (two) times daily., Disp: 180 tablet, Rfl: 3 .  Budesonide (RHINOCORT ALLERGY NA), Place 1 spray into the nose daily., Disp: , Rfl:  .  cetirizine (ZYRTEC) 10 MG tablet, Take 10 mg by mouth daily., Disp: , Rfl:  .  donepezil (ARICEPT) 23 MG TABS tablet, TAKE 1 TABLET AT BEDTIME, Disp:  90 tablet, Rfl: 1 .  DULoxetine (CYMBALTA) 30 MG capsule, Take 1 capsule (30 mg total) by mouth daily., Disp: 30 capsule, Rfl: 3 .  feeding supplement, ENSURE ENLIVE, (ENSURE ENLIVE) LIQD, Take 237 mLs by mouth 2 (two) times daily between meals., Disp: 237 mL, Rfl: 12 .  folic acid (FOLVITE) 1 MG tablet, Take 1 tablet (1 mg total) by mouth daily., Disp: 30 tablet, Rfl: 1 .  furosemide (LASIX)  40 MG tablet, Take 1 tablet (40 mg total) by mouth daily., Disp: 30 tablet, Rfl: 5 .  Multiple Vitamins-Minerals (MULTIVITAMIN WITH MINERALS) tablet, Take 1 tablet by mouth daily. Reported on 01/30/2016, Disp: , Rfl:  .  nitroGLYCERIN (NITROSTAT) 0.4 MG SL tablet, Place 1 tablet (0.4 mg total) under the tongue every 5 (five) minutes as needed for chest pain., Disp: 25 tablet, Rfl: 12 .  potassium chloride SA (K-DUR,KLOR-CON) 20 MEQ tablet, Take 1 tablet (20 mEq total) by mouth daily., Disp: 30 tablet, Rfl: 3 .  rosuvastatin (CRESTOR) 40 MG tablet, TAKE 1 TABLET DAILY WITH BREAKFAST FOR HYPERLIPIDEMIA (KEEP UPCOMING APPOINTMENT IN ORDER TO RECEIVE ADDITIONAL REFILLS), Disp: 90 tablet, Rfl: 3 .  sodium chloride (OCEAN) 0.65 % SOLN nasal spray, Place 1 spray into both nostrils daily as needed for congestion. , Disp: , Rfl:  .  Testosterone 20.25 MG/1.25GM (1.62%) GEL, Apply 2 Squirts topically daily as needed (occasionally). One pump per shoulder., Disp: , Rfl:  .  traMADol (ULTRAM) 50 MG tablet, Take 1 tablet (50 mg total) by mouth every 8 (eight) hours as needed., Disp: 50 tablet, Rfl: 1 .  finasteride (PROSCAR) 5 MG tablet, Take 1 tablet (5 mg total) by mouth daily., Disp: 90 tablet, Rfl: 3  Current Facility-Administered Medications:  .  acetaminophen (TYLENOL) tablet 650 mg, 650 mg, Oral, Q4H PRN, Truitt Merle C, NP .  apixaban (ELIQUIS) tablet 5 mg, 5 mg, Oral, BID, Servando Snare, [name], NP  Allergies  Allergen Reactions  . Procardia [Nifedipine] Rash    All over body       Review of Systems Negative  except as above   Objective:   Vitals:   05/21/17 1432  BP: (!) 90/50  Pulse: 65    General appearance: alert, no distress, WD/WN, Caucasian male Skin: Normal HEENT: normocephalic, conjunctiva/corneas normal, sclerae anicteric, PERRLA, EOMi, nares patent, no discharge or erythema, pharynx normal Oral cavity: MMM, tongue normal, teeth normal Neck: supple, no lymphadenopathy, no thyromegaly, no masses, normal ROM, no bruits Chest: non tender, normal shape and expansion Heart: Irregular rhythm normal S1, S2, intermittent systolic murmur noted.  Lungs: CTA bilaterally, no wheezes, rhonchi, or rales Abdomen: +bs, soft, non tender, non distended, no masses, no hepatomegaly, no splenomegaly, no bruits Musculoskeletal: upper extremities non tender, no obvious deformity, normal ROM throughout, lower extremities non tender, no obvious deformity, normal ROM throughout Extremities: no edema, no cyanosis, no clubbing Pulses: 2+ symmetric, upper and lower extremities, normal cap refill Neurological: alert, oriented x 3, CN2-12 intact, strength normal upper extremities and lower extremities, sensation normal throughout, DTRs 2+ throughout, no cerebellar signs, gait normal Psychiatric: normal affect, behavior normal, pleasant    Assessment and Plan :    Encounter Diagnoses  Name Primary?  . ASHD (arteriosclerotic heart disease) Yes  . Chronic anticoagulation   . Chronic combined systolic and diastolic CHF (congestive heart failure) ([place])   . Essential hypertension, benign   . Late onset Alzheimer's disease without behavioral disturbance   . Paroxysmal atrial fibrillation (HCC)   . Benign prostatic hyperplasia with incomplete bladder emptying   . Allergic rhinitis, unspecified seasonality, unspecified trigger   . BPH associated with nocturia   He seems be status quo with his underlying cardiac condition including ASHD as well as atrial fib. He continues on Eliquis. He also continues on his  other blood pressure medications. His underlying dementia seems to be under good control. I will renew his finasteride which has helped with his bladder related  symptoms. Allergies are giving him no difficulty therefore no further intervention needed.  Physical exam - discussed and counseled on healthy lifestyle, diet, exercise, preventative care, vaccinations, sick and well care, proper use of emergency dept and after hours care, and addressed their concerns.

## 2017-05-21 NOTE — Patient Instructions (Signed)
  Aaron Berg , Thank you for taking time to come for your Medicare Wellness Visit. I appreciate your ongoing commitment to your health goals. Please review the following plan we discussed and let me know if I can assist you in the future.   These are the goals we discussed: Goals    None      This is a list of the screening recommended for you and due dates:  Health Maintenance  Topic Date Due  . Flu Shot  07/15/2017  . Tetanus Vaccine  01/20/2023  . Pneumonia vaccines  Completed

## 2017-05-25 ENCOUNTER — Encounter: Payer: Self-pay | Admitting: Cardiology

## 2017-05-25 ENCOUNTER — Ambulatory Visit: Payer: Medicare Other | Admitting: Cardiology

## 2017-05-25 ENCOUNTER — Ambulatory Visit: Payer: Medicare Other | Admitting: Family Medicine

## 2017-05-25 ENCOUNTER — Ambulatory Visit (INDEPENDENT_AMBULATORY_CARE_PROVIDER_SITE_OTHER): Payer: Medicare Other | Admitting: Cardiology

## 2017-05-25 VITALS — BP 94/70 | HR 81 | Ht 70.0 in | Wt 192.0 lb

## 2017-05-25 DIAGNOSIS — R0600 Dyspnea, unspecified: Secondary | ICD-10-CM

## 2017-05-25 DIAGNOSIS — Z7901 Long term (current) use of anticoagulants: Secondary | ICD-10-CM

## 2017-05-25 DIAGNOSIS — R001 Bradycardia, unspecified: Secondary | ICD-10-CM | POA: Diagnosis not present

## 2017-05-25 DIAGNOSIS — R296 Repeated falls: Secondary | ICD-10-CM | POA: Diagnosis not present

## 2017-05-25 DIAGNOSIS — I5042 Chronic combined systolic (congestive) and diastolic (congestive) heart failure: Secondary | ICD-10-CM

## 2017-05-25 DIAGNOSIS — I2510 Atherosclerotic heart disease of native coronary artery without angina pectoris: Secondary | ICD-10-CM

## 2017-05-25 DIAGNOSIS — F329 Major depressive disorder, single episode, unspecified: Secondary | ICD-10-CM | POA: Diagnosis not present

## 2017-05-25 NOTE — Progress Notes (Signed)
Cardiology Office Note   Date:  05/25/2017   ID:  Aaron Berg, DOB [birth year], MRN [id number]  PCP:  [name], MD  Cardiologist:  Dr. Tamala Julian    Chief Complaint  Patient presents with  . Hospitalization Follow-up      History of Present Illness: Aaron Berg is a 81 y.o. male who presents for post hospitalization 05/12/17 to 05/15/17 for acute and chronic systolic and diastolic HF.   He has a history of CAD s/p CABG in 2003, prior cath in 2012 with patent LIMA to LAD, patent SVG to RCA and 50% ostial stenosis of SVG to LCxand EF being 35% at that time. ALso has chronic combined CHF, mild aortic stenosis/mitral regurg by echo in 09/2016, HTN, HLD, pAfib on eliquis, OSA on CPAP, COPD, microvascular dementia, CVA, and falls who was seen in the office for increased DOE, orthopnea and swelling.   On admit he was diuresed 6 L, and wt was down 8 lbs.  Cr at discharge 1.22.  D/c'd lasix 40 mg daily.  Pt with freq ectopy amiodarone was started.  Was anticoagulated with Eliquis --unable to use BB due to bradycardia.     Today he has no chest pain and no SOB. BP is lower today.  He has had some dizziness at times.  Wt is stable.    Past Medical History:  Diagnosis Date  . Aortic stenosis   . Arthritis    "thumbs, wrists, legs, spine" (05/12/2017)  . Atrial flutter ([place])   . Benign prostatic hyperplasia (BPH) with urinary urgency   . CAD (coronary artery disease)    a. s/p CABG 2003.  [name] Cerebrovascular disease 2006   multiple ischemic changes on prior MRI  . Chronic combined systolic and diastolic CHF (congestive heart failure) ([place])   . Chronic lower back pain   . Complication of anesthesia    "woke up too soon once; had 2nd stroke after right knee replacement"  . COPD (chronic obstructive pulmonary disease) ([place])    "no signs or tests"  . CVA (cerebral vascular accident) ([place]) late 1990s; 01/2014  . Dementia   . Depression   . Dry eyes   . Dyslipidemia    well  controlled  . Gait disorder   . Heart murmur    "found in ~ 1946"  . Hypertension   . Hypertensive cardiovascular disease   . Male hypogonadism   . Migraine    "major one when I was a kid; very painful"  . Mitral regurgitation   . Multiple falls    "after both knees surgeries; went to rehab; came out and fell all the time; I'm much better now" (05/12/2017)  . Obesity   . OSA on CPAP    cpap  . Paroxysmal atrial fibrillation (HCC)   . Skin cancer of anterior chest   . Spinal stenosis     Past Surgical History:  Procedure Laterality Date  . APPENDECTOMY  as child  . CARDIAC CATHETERIZATION  11/2003   Archie Endo 04/29/2011  . CARDIAC CATHETERIZATION N/A 12/03/2016   Procedure: Left Heart Cath and Cors/Grafts Angiography;  Surgeon: Belva Crome, MD;  Location: [facility];  Service: Cardiovascular;  Laterality: N/A;  . CARDIOVERSION N/A 04/26/2014   Procedure: CARDIOVERSION;  Surgeon: Sinclair Grooms, MD;  Location: [facility] ENDOSCOPY;  Service: Cardiovascular;  Laterality: N/A;  . CARDIOVERSION N/A 05/24/2014   Procedure: CARDIOVERSION;  Surgeon: Sinclair Grooms, MD;  Location: [facility];  Service: Cardiovascular;  Laterality: N/A;  . CATARACT EXTRACTION W/ INTRAOCULAR LENS  IMPLANT, BILATERAL Bilateral 2000s  . CORONARY ANGIOPLASTY  1991   "no stents" (10/06/2016)  . CORONARY ARTERY BYPASS GRAFT  11/2002   X 3/notes 04/29/2011  . CYST EXCISION     lumbar; Dr. Joya Salm  . EYE SURGERY Right    detached retina  . JOINT REPLACEMENT    . MOHS SURGERY     on chest  . RETINAL DETACHMENT SURGERY Right   . SHOULDER SURGERY Right    with murphy, reattach ligaments  . TONSILLECTOMY  as child  . TOTAL KNEE ARTHROPLASTY Right 01/02/2014   Procedure: RIGHT TOTAL KNEE ARTHROPLASTY;  Surgeon: Mauri Pole, MD;  [facility]: [facility];  Service: Orthopedics;  Laterality: Right;  . TOTAL KNEE ARTHROPLASTY Left 01/31/2014   Procedure: LEFT TOTAL KNEE ARTHROPLASTY;  Surgeon: Mauri Pole, MD;   [facility]: [facility];  Service: Orthopedics;  Laterality: Left;  . TOTAL SHOULDER ARTHROPLASTY Right   . VIDEO ASSISTED THORACOSCOPY (VATS)/THOROCOTOMY  2003   benign nodule     Current Outpatient Prescriptions  Medication Sig Dispense Refill  . amiodarone (PACERONE) 200 MG tablet Take 1 tablet (200 mg total) by mouth daily. 30 tablet 5  . apixaban (ELIQUIS) 5 MG TABS tablet Take 1 tablet (5 mg total) by mouth 2 (two) times daily. 180 tablet 3  . Budesonide (RHINOCORT ALLERGY NA) Place 1 spray into the nose daily.    . cetirizine (ZYRTEC) 10 MG tablet Take 10 mg by mouth daily.    [name] donepezil (ARICEPT) 23 MG TABS tablet TAKE 1 TABLET AT BEDTIME 90 tablet 1  . DULoxetine (CYMBALTA) 30 MG capsule Take 1 capsule (30 mg total) by mouth daily. 30 capsule 3  . feeding supplement, ENSURE ENLIVE, (ENSURE ENLIVE) LIQD Take 237 mLs by mouth 2 (two) times daily between meals. 237 mL 12  . finasteride (PROSCAR) 5 MG tablet Take 1 tablet (5 mg total) by mouth daily. 90 tablet 3  . folic acid (FOLVITE) 1 MG tablet Take 1 tablet (1 mg total) by mouth daily. 30 tablet 1  . furosemide (LASIX) 40 MG tablet Take 1 tablet (40 mg total) by mouth daily. 30 tablet 5  . Multiple Vitamins-Minerals (MULTIVITAMIN WITH MINERALS) tablet Take 1 tablet by mouth daily. Reported on 01/30/2016    . nitroGLYCERIN (NITROSTAT) 0.4 MG SL tablet Place 1 tablet (0.4 mg total) under the tongue every 5 (five) minutes as needed for chest pain. 25 tablet 12  . potassium chloride SA (K-DUR,KLOR-CON) 20 MEQ tablet Take 1 tablet (20 mEq total) by mouth daily. 30 tablet 3  . rosuvastatin (CRESTOR) 40 MG tablet TAKE 1 TABLET DAILY WITH BREAKFAST FOR HYPERLIPIDEMIA (KEEP UPCOMING APPOINTMENT IN ORDER TO RECEIVE ADDITIONAL REFILLS) 90 tablet 3  . sodium chloride (OCEAN) 0.65 % SOLN nasal spray Place 1 spray into both nostrils daily as needed for congestion.     . Testosterone 20.25 MG/1.25GM (1.62%) GEL Apply 2 Squirts topically daily as needed  (occasionally). One pump per shoulder.    . traMADol (ULTRAM) 50 MG tablet Take 1 tablet (50 mg total) by mouth every 8 (eight) hours as needed. 50 tablet 1   Current Facility-Administered Medications  Medication Dose Route Frequency Provider Last Rate Last Dose  . acetaminophen (TYLENOL) tablet 650 mg  650 mg Oral Q4H PRN Truitt Merle C, NP      . apixaban (ELIQUIS) tablet 5 mg  5 mg Oral BID Burtis Junes,  NP        Allergies:   Procardia [nifedipine]    Social History:  The patient  reports that he quit smoking about 27 years ago. His smoking use included Cigarettes. He has a 66.00 pack-year smoking history. He has never used smokeless tobacco. He reports that he drinks about 4.8 oz of alcohol per week . He reports that he does not use drugs.   Family History:  The patient's family history includes Arthritis in his sister; CAD in his father and mother; Healthy in his sister; Pancreatic cancer in his father.    ROS:  General:no colds or fevers, no weight changes Skin:no rashes or ulcers HEENT:no blurred vision, no congestion CV:see HPI PUL:see HPI GI:no diarrhea constipation or melena, no indigestion GU:no hematuria, no dysuria MS:no joint pain, no claudication Neuro:no syncope, no lightheadedness Endo:no diabetes, no thyroid disease  Wt Readings from Last 3 Encounters:  05/25/17 192 lb (87.1 kg)  05/21/17 191 lb (86.6 kg)  05/15/17 195 lb (88.5 kg)     PHYSICAL EXAM: VS:  BP 94/70 (BP Location: Left Arm, Patient Position: Sitting, Cuff Size: Normal)   Pulse 81   Ht 5\' 10"  (1.778 m)   Wt 192 lb (87.1 kg)   SpO2 96%   BMI 27.55 kg/m  , BMI Body mass index is 27.55 kg/m. General:Pleasant affect, NAD Skin:Warm and dry, brisk capillary refill HEENT:normocephalic, sclera clear, mucus membranes moist Neck:supple, no JVD, no bruits  Heart:S1S2 RRR without murmur, gallup, rub or click Lungs:clear without rales, rhonchi, or wheezes AYT:KZSW, non tender, + BS, do not  palpate liver spleen or masses Ext:no lower ext edema, 2+ pedal pulses, 2+ radial pulses Neuro:alert and oriented, MAE, follows commands, + facial symmetry    EKG:  EKG is ordered today. The ekg ordered today demonstrates SR with PAC and PVC old Q wave in III.  No acute changes. QTc 525 similar to in hospital.     Recent Labs: 03/23/2017: NT-Pro BNP 4,260 05/12/2017: B Natriuretic Peptide 1,703.1; TSH 1.397 05/14/2017: Magnesium 1.9 05/21/2017: ALT 13; BUN 23; Creat 1.20; Hemoglobin 14.1; Platelets 158; Potassium 4.2; Sodium 137    Lipid Panel    Component Value Date/Time   CHOL 191 01/09/2016 0001   TRIG 251 (H) 01/09/2016 0001   HDL 56 01/09/2016 0001   CHOLHDL 3.4 01/09/2016 0001   VLDL 50 (H) 01/09/2016 0001   LDLCALC 85 01/09/2016 0001       Other studies Reviewed: Additional studies/ records that were reviewed today include: . Echo ------------------------------------------------------------------- Study Conclusions  - Left ventricle: The cavity size was moderately dilated. Systolic   function was mildly to moderately reduced. The estimated ejection   fraction was in the range of 40% to 45%. Diffuse hypokinesis.   Features are consistent with a pseudonormal left ventricular   filling pattern, with concomitant abnormal relaxation and   increased filling pressure (grade 2 diastolic dysfunction). - Aortic valve: Moderately calcified annulus. Trileaflet;   moderately thickened, moderately calcified leaflets. There was   mild stenosis. Valve area (VTI): 1.14 cm^2. Valve area (Vmax):   1.18 cm^2. Valve area (Vmean): 1.08 cm^2. - Mitral valve: There was mild regurgitation   Cath 12/03/16 Procedures   Left Heart Cath and Cors/Grafts Angiography  Conclusion     Ost Cx lesion, 100 %stenosed.    Severe native vessel coronary disease with total occlusion of the right coronary artery, 99% stenosis of the entire left main and ostial LAD, and total occlusion of the  circumflex.  Patent saphenous vein graft to the circumflex/ramus, Patent saphenous vein graft to the PDA, and patent LIMA to the LAD.  No gradient across the aortic valve. Elevated left ventricular end-diastolic pressure 26 mmHg.  Abnormal myocardial perfusion study likely represents myocardial infarction dating to pre-bypass surgery.  RECOMMENDATIONS:   Continue medical therapy  Angio-Seal deployed. If no difficulty with femoral access may be discharged later today.     ASSESSMENT AND PLAN:  1.  Acute on chronic systolic and diastolic Hf, wt is stable.  Has some DOE.  Continue current dose of lasix though if wt up 3 lbs in a day or 5 lbs in a week take an extra 20 mg of lasix.  If increasing extra doses of lasix to call. Will check BMP today on diuretic.  He will keep follow up with Truitt Merle NP on July 2.   2. CKD 3 will recheck BMP  3. PAF - on amiodarone maintaining SR - follow up TSH LFTs with next visit on amiodarone. Continue eliquis BID, no bleeding. freq ectopy and episode of SVT in hospital.   4. CAD with hx CABG, last cath 11/2016 stable treat medically  5.  Hx of bradycardia but off BB and amiodarone alone no brady.  6. Hx dementia, daughter is with him today.    7. Borderline BP but no dizziness currently.  BP higher than in hospital.          Current medicines are reviewed with the patient today.  The patient Has no concerns regarding medicines.  The following changes have been made:  See above Labs/ tests ordered today include:see above  Disposition:   FU:  see above  Signed, Cecilie Kicks, NP  05/25/2017 [facility] [place], [place], [place] Phone: [phone]; Fax: [phone]

## 2017-05-25 NOTE — Patient Instructions (Signed)
Medication Instructions:  1) If your weight is up 3 pounds in one day or 5 pounds in one week, you may take an extra half of your Furosemide.  If you are having to do this frequently, please contact our office.  Labwork: None  Testing/Procedures: None  Follow-Up: Keep follow up appointment with Truitt Merle , NP on 06/15/17.   Any Other Special Instructions Will Be Listed Below (If Applicable).     If you need a refill on your cardiac medications before your next appointment, please call your pharmacy.

## 2017-05-27 DIAGNOSIS — R296 Repeated falls: Secondary | ICD-10-CM | POA: Diagnosis not present

## 2017-05-27 DIAGNOSIS — F329 Major depressive disorder, single episode, unspecified: Secondary | ICD-10-CM | POA: Diagnosis not present

## 2017-05-28 ENCOUNTER — Telehealth: Payer: Self-pay | Admitting: Family Medicine

## 2017-05-28 DIAGNOSIS — F329 Major depressive disorder, single episode, unspecified: Secondary | ICD-10-CM | POA: Diagnosis not present

## 2017-05-28 DIAGNOSIS — R296 Repeated falls: Secondary | ICD-10-CM | POA: Diagnosis not present

## 2017-05-28 NOTE — Telephone Encounter (Signed)
Charlotte @ [facility] called to get approval to see the pt in his home for 2 more visits (once next week and once the week after) since the pt was placed on potassium the nurse wants to make sure that the pt does well with this med then release him. Call [facility] at [phone]

## 2017-05-28 NOTE — Telephone Encounter (Signed)
I tried to call and no answer mailbox still full

## 2017-05-28 NOTE — Telephone Encounter (Signed)
Tried to call Aaron Berg from [facility] but mailbox is full and can't leave a message

## 2017-05-28 NOTE — Telephone Encounter (Signed)
ok 

## 2017-05-28 NOTE — Telephone Encounter (Signed)
Aaron Berg was informed okay per [employer]

## 2017-06-01 DIAGNOSIS — R296 Repeated falls: Secondary | ICD-10-CM | POA: Diagnosis not present

## 2017-06-01 DIAGNOSIS — F329 Major depressive disorder, single episode, unspecified: Secondary | ICD-10-CM | POA: Diagnosis not present

## 2017-06-03 DIAGNOSIS — R296 Repeated falls: Secondary | ICD-10-CM | POA: Diagnosis not present

## 2017-06-03 DIAGNOSIS — F329 Major depressive disorder, single episode, unspecified: Secondary | ICD-10-CM | POA: Diagnosis not present

## 2017-06-08 ENCOUNTER — Telehealth: Payer: Self-pay | Admitting: Interventional Cardiology

## 2017-06-08 DIAGNOSIS — F329 Major depressive disorder, single episode, unspecified: Secondary | ICD-10-CM | POA: Diagnosis not present

## 2017-06-08 DIAGNOSIS — R296 Repeated falls: Secondary | ICD-10-CM | POA: Diagnosis not present

## 2017-06-08 NOTE — Telephone Encounter (Signed)
New message    Anda Kraft with [facility] is calling about pt. She states pt has a weight gain of 4 lbs in 3 days. She said pt's O2 is running at 93%. She said he feels like he has no energy.   Pt c/o BP issue: STAT if pt c/o blurred vision, one-sided weakness or slurred speech  1. What are your last 5 BP readings? 94/62-today  2. Are you having any other symptoms (ex. Dizziness, headache, blurred vision, passed out)? No  3. What is your BP issue? Anda Kraft is asking if pt should take another lasix if his bp is low.

## 2017-06-08 NOTE — Telephone Encounter (Signed)
Spoke with Anda Kraft, [facility] PT.  She states BP today was 94/62.  Weight Saturday was 189.2, Sunday 191.8 and today 193.  Pt states he has no energy, denies SOB, lightheadedness or dizziness.  O2 is at 93%.  Pt has not taken any extra lasix since he was last seen by Cecilie Kicks, NP on 6/11.  Pt did miss all of his meds Saturday morning but otherwise has taken meds as prescribed.  Advised I would send message to Dr. Tamala Julian for review and advisement.  Anda Kraft verbalized understanding and states daughter, Sharyn Lull, should be called with any recommendations.

## 2017-06-10 ENCOUNTER — Telehealth: Payer: Self-pay

## 2017-06-10 ENCOUNTER — Encounter: Payer: Self-pay | Admitting: Family Medicine

## 2017-06-10 ENCOUNTER — Ambulatory Visit (INDEPENDENT_AMBULATORY_CARE_PROVIDER_SITE_OTHER): Payer: Medicare Other | Admitting: Family Medicine

## 2017-06-10 VITALS — BP 110/68 | HR 67 | Temp 98.1°F | Resp 14 | Wt 194.6 lb

## 2017-06-10 DIAGNOSIS — Z5181 Encounter for therapeutic drug level monitoring: Secondary | ICD-10-CM

## 2017-06-10 DIAGNOSIS — I2510 Atherosclerotic heart disease of native coronary artery without angina pectoris: Secondary | ICD-10-CM | POA: Diagnosis not present

## 2017-06-10 DIAGNOSIS — I480 Paroxysmal atrial fibrillation: Secondary | ICD-10-CM

## 2017-06-10 DIAGNOSIS — R296 Repeated falls: Secondary | ICD-10-CM | POA: Diagnosis not present

## 2017-06-10 DIAGNOSIS — F329 Major depressive disorder, single episode, unspecified: Secondary | ICD-10-CM | POA: Diagnosis not present

## 2017-06-10 DIAGNOSIS — I5042 Chronic combined systolic (congestive) and diastolic (congestive) heart failure: Secondary | ICD-10-CM

## 2017-06-10 DIAGNOSIS — Z7901 Long term (current) use of anticoagulants: Secondary | ICD-10-CM | POA: Diagnosis not present

## 2017-06-10 LAB — BASIC METABOLIC PANEL
BUN: 24 mg/dL (ref 7–25)
CO2: 25 mmol/L (ref 20–31)
Calcium: 8.8 mg/dL (ref 8.6–10.3)
Chloride: 106 mmol/L (ref 98–110)
Creat: 1.4 mg/dL — ABNORMAL HIGH (ref 0.70–1.18)
GLUCOSE: 111 mg/dL — AB (ref 65–99)
POTASSIUM: 4.1 mmol/L (ref 3.5–5.3)
Sodium: 141 mmol/L (ref 135–146)

## 2017-06-10 NOTE — Telephone Encounter (Signed)
What is weight now? What is BP now? Is he following low salt diet? If weight continues up and if BP is > 491 mm Hg systolic, increase lasix to 40 mg BID ortherwise, send me the data to re-think.

## 2017-06-10 NOTE — Progress Notes (Signed)
Chief Complaint  Patient presents with  . feeling tried    started last night having chills, jerking in legs and arm,s and bodyaches , some confusion ,    Patient presents, accompanied by his daughter, Edmonia Lynch Investment banker, corporate at [employer] imaging)   For the last 3 days he hasn't been able to exercise--has felt fatigued, decreased energy. Yesterday he felt slightly short of breath, developed malaise, worse as the day went on.  After dinner yesterday he started having pain in his lower body (spine, hips, legs, down to the toes).  In bed last night, pain kept him awake, and he noticed a lot of jerking, quivering of his hands and feet, and he felt cold.  The jerking/quivering lasted all night, interfering with his sleep, and he continued to feel cold (but not described as shivering or chills).  This morning, probably around 6am, this improved.  He woke up again at 9:30 and was better, and took his regular medications.  Remains fatigued but otherwise feels much better than yesterday and last night.  [facility] nurse came today, told him to be seen/evaluated, due to these symptoms.    Chart was reviewed.  He has h/o atrial fibrillation, but isn't in it most of the time (per pt). He was admitted end of May with CHF for 3 days.  Runs of Vtach noted when in the hospital, which is why amiodarone was added. Potassium was added with recent hospitalization, when lasix dose was increased to 40mg . He is scheduled to f/u with cardiologist on Monday, and due to have LFT"s checked.  PMh, PSH, SH reviewed and updated (now living at [facility])  Outpatient Encounter Prescriptions as of 06/10/2017  Medication Sig  . amiodarone (PACERONE) 200 MG tablet Take 1 tablet (200 mg total) by mouth daily.  [name] apixaban (ELIQUIS) 5 MG TABS tablet Take 1 tablet (5 mg total) by mouth 2 (two) times daily.  . Budesonide (RHINOCORT ALLERGY NA) Place 1 spray into the nose daily.  . cetirizine (ZYRTEC) 10 MG tablet Take 10 mg by mouth daily.  [name]  donepezil (ARICEPT) 23 MG TABS tablet TAKE 1 TABLET AT BEDTIME  . DULoxetine (CYMBALTA) 30 MG capsule Take 1 capsule (30 mg total) by mouth daily.  . feeding supplement, ENSURE ENLIVE, (ENSURE ENLIVE) LIQD Take 237 mLs by mouth 2 (two) times daily between meals.  . finasteride (PROSCAR) 5 MG tablet Take 1 tablet (5 mg total) by mouth daily.  . folic acid (FOLVITE) 1 MG tablet Take 1 tablet (1 mg total) by mouth daily.  . furosemide (LASIX) 40 MG tablet Take 1 tablet (40 mg total) by mouth daily.  . Multiple Vitamins-Minerals (MULTIVITAMIN WITH MINERALS) tablet Take 1 tablet by mouth daily. Reported on 01/30/2016  . nitroGLYCERIN (NITROSTAT) 0.4 MG SL tablet Place 1 tablet (0.4 mg total) under the tongue every 5 (five) minutes as needed for chest pain.  . potassium chloride SA (K-DUR,KLOR-CON) 20 MEQ tablet Take 1 tablet (20 mEq total) by mouth daily.  . rosuvastatin (CRESTOR) 40 MG tablet TAKE 1 TABLET DAILY WITH BREAKFAST FOR HYPERLIPIDEMIA (KEEP UPCOMING APPOINTMENT IN ORDER TO RECEIVE ADDITIONAL REFILLS)  . sodium chloride (OCEAN) 0.65 % SOLN nasal spray Place 1 spray into both nostrils daily as needed for congestion.   . Testosterone 20.25 MG/1.25GM (1.62%) GEL Apply 2 Squirts topically daily as needed (occasionally). One pump per shoulder.  . traMADol (ULTRAM) 50 MG tablet Take 1 tablet (50 mg total) by mouth every 8 (eight) hours as needed.  Facility-Administered Encounter Medications as of 06/10/2017  Medication  . acetaminophen (TYLENOL) tablet 650 mg  . apixaban (ELIQUIS) tablet 5 mg    ROS: Denies headaches. Some intermittent dizziness. Has some chronic balance issues.  Denies sore throat.   Takes medications for allergies, but still has some runny nose.  Denies cough (rare from PND).  Slightly winded with activity recently. Denies swelling in his feet. Slight constipation.  Normal appetite, no nausea, vomiting.  No bleeding.  Easy bruising on forearms. Moods are good  PHYSICAL  EXAM:  BP 110/68 (BP Location: Left Arm, Patient Position: Sitting)   Pulse 67   Temp 98.1 F (36.7 C)   Resp 14   Wt 194 lb 9.6 oz (88.3 kg)   SpO2 97%   BMI 27.92 kg/m    Wt Readings from Last 3 Encounters:  06/10/17 194 lb 9.6 oz (88.3 kg)  05/25/17 192 lb (87.1 kg)  05/21/17 191 lb (86.6 kg)   Well developed, pleasant, elderly man in no distress. He is alert, oriented, speaking easily in full sentences, appears well. HEENT: conjunctiva and sclera are clear Neck: No lymphadenopathy, thyromegaly or carotid bruit. No elevated JVP Heart: regular rate. At times rhythem seemed regular with pause or extra beat, other times there were more irregular episodes, couldn't r/o afib Lungs: clear bilaterally Abdomen: soft, nontender, no mass Extremities: no edema, brisk capillary refill Skin: some bruising on forearms, normal turgor, no rashes Psych: normal mood, affect, hygiene and grooming Neuro: cranial nerves intact, alert. Normal strength  ASSESSMENT/PLAN:  Medication monitoring encounter - lasix recently doubled; amiodarone recently added - Plan: Basic metabolic panel  Chronic combined systolic and diastolic CHF (congestive heart failure) (HCC) - no evidence of fluid overload  PAF (paroxysmal atrial fibrillation) ([place]) - questionable whether he is in afib now vs frequent ectopy.  Likely in afib, contributing to DOE. Rate controlled. f/u with cardiologist Monday  Chronic anticoagulation - no evidence of bleeding   Strange collection of symptoms, most all of which resolved, with some residual fatigue.  This can be multifactorial, including paroxysmal atrial fibrillation.  Rate is well controlled, and he is anticoagulated.  Blood pressure is normal.  He is scheduled to see cardiologist Monday. Discussed EKG, and given that he is overall feeling better, okay to hold off until he sees cardiologist Monday.  afib can contribute to DOE. Amiodarone was newly added, and lasix dose was  doubled.  Will check electrolytes.  No thyroid symptoms (and too soon to likely see abnormality; not tachycardic).  Return for re-eval if symptoms recur/persist/worsen or new symptoms develop.   Call [facility] [phone] with results.

## 2017-06-10 NOTE — Telephone Encounter (Signed)
Nurse with [facility]) called to let us know that pt has had "weird" symptoms where he will have increased weakness in the legs, and last night had some confusion with tingling, burning in legs. Pt states that he has had SHOB, tremors all night. Today pt having SHOB and continued weakness. Aaron Berg

## 2017-06-11 NOTE — Telephone Encounter (Signed)
Spoke with daughter Sharyn Lull and she states pt's BP has improved.  Pt seen PCP yesterday and vitals were 110/68, HR 67, O2 97%, Wt 194.9.  Dtr did not see any swelling on pt yesterday. States pt gets winded with exertion but this is stable and not out of the norm for him.  Lightheadedness and dizziness has improved.  Dtr did not feel like pt needed to be seen Monday and agreed to keep f/u on 7/16 with Truitt Merle, NP.  Advised dtr if any changes to please let us know and we will try to move up appt.  Dtr appreciative for assistance.

## 2017-06-11 NOTE — Telephone Encounter (Signed)
Left message for daughter to call back.  

## 2017-06-15 ENCOUNTER — Ambulatory Visit: Payer: Medicare Other | Admitting: Nurse Practitioner

## 2017-06-15 DIAGNOSIS — R296 Repeated falls: Secondary | ICD-10-CM | POA: Diagnosis not present

## 2017-06-15 DIAGNOSIS — F329 Major depressive disorder, single episode, unspecified: Secondary | ICD-10-CM | POA: Diagnosis not present

## 2017-06-16 ENCOUNTER — Other Ambulatory Visit: Payer: Self-pay | Admitting: Interventional Cardiology

## 2017-06-18 DIAGNOSIS — I5043 Acute on chronic combined systolic (congestive) and diastolic (congestive) heart failure: Secondary | ICD-10-CM | POA: Diagnosis not present

## 2017-06-18 DIAGNOSIS — I110 Hypertensive heart disease with heart failure: Secondary | ICD-10-CM | POA: Diagnosis not present

## 2017-06-19 ENCOUNTER — Ambulatory Visit: Payer: Medicare Other | Admitting: Nurse Practitioner

## 2017-06-19 ENCOUNTER — Telehealth: Payer: Self-pay | Admitting: Family Medicine

## 2017-06-19 DIAGNOSIS — I5043 Acute on chronic combined systolic (congestive) and diastolic (congestive) heart failure: Secondary | ICD-10-CM | POA: Diagnosis not present

## 2017-06-19 DIAGNOSIS — I110 Hypertensive heart disease with heart failure: Secondary | ICD-10-CM | POA: Diagnosis not present

## 2017-06-19 NOTE — Telephone Encounter (Signed)
Request received for Eliquis 5mg ; pt wt 81 yrs old, wt-88.3kg, Crea-1.40 on 06/10/17, last seen by Cecilie Kicks on 05/25/17; will send in refill as requested to requested pharmacy.

## 2017-06-19 NOTE — Telephone Encounter (Signed)
Ena Dawley informed she verbalized understanding

## 2017-06-19 NOTE — Telephone Encounter (Signed)
Okay for when necessary visit from the nurse but is having more difficulty, he needs to be reevaluated by cardiology

## 2017-06-19 NOTE — Telephone Encounter (Signed)
Ena Dawley @ [place] called to report that she was in the home performing a PRN nurse visit on the pt because he was stating that he was not feeling well and that he was feeling worse than when he was seen in our office last. Ena Dawley is request approval for her PRN visit. She also wants to report that pt has increased shortness of breath, lethargic, difficulty breathing, swelling of left ankle. Pulse ox went down to 90 when pt laid down on bed. Pt reports twitches. Ena Dawley wants to know what Dr Redmond School wants to do for pt. Maybe an overnight O2 stat? Labs? Ena Dawley can be reached at [phone]

## 2017-06-22 ENCOUNTER — Telehealth: Payer: Self-pay | Admitting: *Deleted

## 2017-06-22 ENCOUNTER — Telehealth: Payer: Self-pay | Admitting: Interventional Cardiology

## 2017-06-22 DIAGNOSIS — I110 Hypertensive heart disease with heart failure: Secondary | ICD-10-CM | POA: Diagnosis not present

## 2017-06-22 DIAGNOSIS — R0602 Shortness of breath: Secondary | ICD-10-CM

## 2017-06-22 DIAGNOSIS — R0609 Other forms of dyspnea: Principal | ICD-10-CM

## 2017-06-22 DIAGNOSIS — I5043 Acute on chronic combined systolic (congestive) and diastolic (congestive) heart failure: Secondary | ICD-10-CM | POA: Diagnosis not present

## 2017-06-22 DIAGNOSIS — G4733 Obstructive sleep apnea (adult) (pediatric): Secondary | ICD-10-CM

## 2017-06-22 NOTE — Telephone Encounter (Signed)
Overnight pulse oximetry ordered for patient and faxed to [facility].

## 2017-06-22 NOTE — Telephone Encounter (Signed)
Spoke with daughter and advised her of recommendations per Dr. Tamala Julian.  They will have testing done tomorrow after appt in our office.  Moved pt to Dr. Thompson Caul schedule at 0800 tomorrow.

## 2017-06-22 NOTE — Telephone Encounter (Signed)
Aaron Berg(daughter) is calling because her father is having trouble breathing with any exsertion. He does not have any swelling and no weight gain . Just having trouble breathing , Please call    Thanks

## 2017-06-22 NOTE — Telephone Encounter (Signed)
-----   Message from Loren Racer, LPN sent at 04/21/3166 10:05 AM EDT ----- Stark Bray,  I just placed an order for this pt to have an overnight pulse oximetry.  Not sure of the exact process for getting this scheduled through [facility].  Thanks [employer]

## 2017-06-22 NOTE — Telephone Encounter (Signed)
I spoke with pt's daughter, Sharyn Lull. Sharyn Lull states for about 2 weeks pt has had shortness of breath with exertion, pt okay at rest but does take a little time to recover breathing after exertion. Sharyn Lull states pt has no weight gain, no lower extremity edema, no abdominal distention.  I have scheduled pt to see Nell Range, PA 06/23/17 at 9:30 AM.   I will forward to Dr Tamala Julian for review.

## 2017-06-22 NOTE — Telephone Encounter (Signed)
Follow up     Aaron Berg from home health  -  Asking  for overnight pulse ox.

## 2017-06-22 NOTE — Telephone Encounter (Signed)
Left message for daughter to call back.    Spoke with Ena Dawley at [facility] and she said they were just making a suggestion on doing the overnight pulse ox but they wouldn't actually order it.  Spoke with Dr. Tamala Julian and he said ok to order.

## 2017-06-22 NOTE — Telephone Encounter (Signed)
Needs PA and lateral chest x-ray BNP Basic metabolic panel CBC

## 2017-06-23 ENCOUNTER — Ambulatory Visit: Payer: Medicare Other | Admitting: Physician Assistant

## 2017-06-23 ENCOUNTER — Encounter: Payer: Self-pay | Admitting: Interventional Cardiology

## 2017-06-23 ENCOUNTER — Ambulatory Visit (INDEPENDENT_AMBULATORY_CARE_PROVIDER_SITE_OTHER): Payer: Medicare Other | Admitting: Interventional Cardiology

## 2017-06-23 ENCOUNTER — Other Ambulatory Visit: Payer: Medicare Other

## 2017-06-23 VITALS — BP 110/68 | HR 64 | Ht 68.0 in | Wt 194.0 lb

## 2017-06-23 DIAGNOSIS — I10 Essential (primary) hypertension: Secondary | ICD-10-CM

## 2017-06-23 DIAGNOSIS — R0602 Shortness of breath: Secondary | ICD-10-CM

## 2017-06-23 DIAGNOSIS — Z7901 Long term (current) use of anticoagulants: Secondary | ICD-10-CM

## 2017-06-23 DIAGNOSIS — I209 Angina pectoris, unspecified: Secondary | ICD-10-CM

## 2017-06-23 DIAGNOSIS — I2510 Atherosclerotic heart disease of native coronary artery without angina pectoris: Secondary | ICD-10-CM | POA: Diagnosis not present

## 2017-06-23 DIAGNOSIS — I5042 Chronic combined systolic (congestive) and diastolic (congestive) heart failure: Secondary | ICD-10-CM

## 2017-06-23 DIAGNOSIS — I480 Paroxysmal atrial fibrillation: Secondary | ICD-10-CM | POA: Diagnosis not present

## 2017-06-23 DIAGNOSIS — I25709 Atherosclerosis of coronary artery bypass graft(s), unspecified, with unspecified angina pectoris: Secondary | ICD-10-CM | POA: Diagnosis not present

## 2017-06-23 LAB — CBC
HEMATOCRIT: 42.4 % (ref 37.5–51.0)
Hemoglobin: 14.2 g/dL (ref 13.0–17.7)
MCH: 29.4 pg (ref 26.6–33.0)
MCHC: 33.5 g/dL (ref 31.5–35.7)
MCV: 88 fL (ref 79–97)
Platelets: 167 10*3/uL (ref 150–379)
RBC: 4.83 x10E6/uL (ref 4.14–5.80)
RDW: 15.7 % — AB (ref 12.3–15.4)
WBC: 6.5 10*3/uL (ref 3.4–10.8)

## 2017-06-23 LAB — BASIC METABOLIC PANEL
BUN / CREAT RATIO: 18 (ref 10–24)
BUN: 24 mg/dL (ref 8–27)
CO2: 23 mmol/L (ref 20–29)
CREATININE: 1.37 mg/dL — AB (ref 0.76–1.27)
Calcium: 9.5 mg/dL (ref 8.6–10.2)
Chloride: 100 mmol/L (ref 96–106)
GFR calc non Af Amer: 49 mL/min/{1.73_m2} — ABNORMAL LOW (ref >59)
GFR, EST AFRICAN AMERICAN: 56 mL/min/{1.73_m2} — AB (ref >59)
Glucose: 89 mg/dL (ref 65–99)
Potassium: 4.2 mmol/L (ref 3.5–5.2)
Sodium: 140 mmol/L (ref 134–144)

## 2017-06-23 LAB — PRO B NATRIURETIC PEPTIDE: NT-Pro BNP: 8711 pg/mL — ABNORMAL HIGH (ref 0–486)

## 2017-06-23 MED ORDER — FUROSEMIDE 80 MG PO TABS: 80.0000 mg | ORAL_TABLET | Freq: Every day | ORAL | 3 refills | Status: DC

## 2017-06-23 NOTE — Progress Notes (Signed)
Cardiology Office Note    Date:  06/23/2017   ID:  Aaron Berg, DOB 13-Nov-1936, MRN [id number]  PCP:  [name], MD  Cardiologist: Sinclair Grooms, MD   Chief Complaint  Patient presents with  . Shortness of Breath    History of Present Illness:  Aaron Berg is a 81 y.o. male with a history of CAD s/p CABG in 2003, prior cath in 2012 with patent LIMA to LAD, patent SVG to RCA and 50% ostial stenosis of SVG to LCxand EF being 35% at that time. ALso has chronic combined CHF, mild aortic stenosis/mitral regurg by echo in 09/2016, HTN, HLD, pAfib on eliquis, OSA on CPAP, COPD, microvascular dementia, CVA, and falls who was seen in the office for increased DOE, orthopnea and swelling.   Over the past 3 days he has noted increased exertional fatigue, dyspnea, and orthopnea. No chest pain. No palpitations or other complaints.  Past Medical History:  Diagnosis Date  . Aortic stenosis   . Arthritis    "thumbs, wrists, legs, spine" (05/12/2017)  . Atrial flutter ([place])   . Benign prostatic hyperplasia (BPH) with urinary urgency   . CAD (coronary artery disease)    a. s/p CABG 2003.  [name] Cerebrovascular disease 2006   multiple ischemic changes on prior MRI  . Chronic combined systolic and diastolic CHF (congestive heart failure) ([place])   . Chronic lower back pain   . Complication of anesthesia    "woke up too soon once; had 2nd stroke after right knee replacement"  . COPD (chronic obstructive pulmonary disease) ([place])    "no signs or tests"  . CVA (cerebral vascular accident) ([place]) late 1990s; 01/2014  . Dementia   . Depression   . Dry eyes   . Dyslipidemia    well controlled  . Gait disorder   . Heart murmur    "found in ~ 1946"  . Hypertension   . Hypertensive cardiovascular disease   . Male hypogonadism   . Migraine    "major one when I was a kid; very painful"  . Mitral regurgitation   . Multiple falls    "after both knees surgeries; went to rehab; came out and  fell all the time; I'm much better now" (05/12/2017)  . Obesity   . OSA on CPAP    cpap  . Paroxysmal atrial fibrillation (HCC)   . Skin cancer of anterior chest   . Spinal stenosis     Past Surgical History:  Procedure Laterality Date  . APPENDECTOMY  as child  . CARDIAC CATHETERIZATION  11/2003   Archie Endo 04/29/2011  . CARDIAC CATHETERIZATION N/A 12/03/2016   Procedure: Left Heart Cath and Cors/Grafts Angiography;  Surgeon: Belva Crome, MD;  Location: [facility];  Service: Cardiovascular;  Laterality: N/A;  . CARDIOVERSION N/A 04/26/2014   Procedure: CARDIOVERSION;  Surgeon: Sinclair Grooms, MD;  Location: [facility] ENDOSCOPY;  Service: Cardiovascular;  Laterality: N/A;  . CARDIOVERSION N/A 05/24/2014   Procedure: CARDIOVERSION;  Surgeon: Sinclair Grooms, MD;  Location: [facility];  Service: Cardiovascular;  Laterality: N/A;  . CATARACT EXTRACTION W/ INTRAOCULAR LENS  IMPLANT, BILATERAL Bilateral 2000s  . CORONARY ANGIOPLASTY  1991   "no stents" (10/06/2016)  . CORONARY ARTERY BYPASS GRAFT  11/2002   X 3/notes 04/29/2011  . CYST EXCISION     lumbar; Dr. Joya Salm  . EYE SURGERY Right    detached retina  . JOINT REPLACEMENT    .  MOHS SURGERY     on chest  . RETINAL DETACHMENT SURGERY Right   . SHOULDER SURGERY Right    with murphy, reattach ligaments  . TONSILLECTOMY  as child  . TOTAL KNEE ARTHROPLASTY Right 01/02/2014   Procedure: RIGHT TOTAL KNEE ARTHROPLASTY;  Surgeon: Mauri Pole, MD;  Location: [facility] ORS;  Service: Orthopedics;  Laterality: Right;  . TOTAL KNEE ARTHROPLASTY Left 01/31/2014   Procedure: LEFT TOTAL KNEE ARTHROPLASTY;  Surgeon: Mauri Pole, MD;  Location: [facility] ORS;  Service: Orthopedics;  Laterality: Left;  . TOTAL SHOULDER ARTHROPLASTY Right   . VIDEO ASSISTED THORACOSCOPY (VATS)/THOROCOTOMY  2003   benign nodule    Current Medications: Outpatient Medications Prior to Visit  Medication Sig Dispense Refill  . amiodarone (PACERONE) 200 MG tablet Take  1 tablet (200 mg total) by mouth daily. 30 tablet 5  . Budesonide (RHINOCORT ALLERGY NA) Place 1 spray into the nose daily.    . cetirizine (ZYRTEC) 10 MG tablet Take 10 mg by mouth daily.    [name] donepezil (ARICEPT) 23 MG TABS tablet TAKE 1 TABLET AT BEDTIME 90 tablet 1  . DULoxetine (CYMBALTA) 30 MG capsule Take 1 capsule (30 mg total) by mouth daily. 30 capsule 3  . ELIQUIS 5 MG TABS tablet TAKE 1 TABLET TWICE A DAY 180 tablet 3  . feeding supplement, ENSURE ENLIVE, (ENSURE ENLIVE) LIQD Take 237 mLs by mouth 2 (two) times daily between meals. 237 mL 12  . finasteride (PROSCAR) 5 MG tablet Take 1 tablet (5 mg total) by mouth daily. 90 tablet 3  . folic acid (FOLVITE) 1 MG tablet Take 1 tablet (1 mg total) by mouth daily. 30 tablet 1  . furosemide (LASIX) 40 MG tablet Take 1 tablet (40 mg total) by mouth daily. 30 tablet 5  . Multiple Vitamins-Minerals (MULTIVITAMIN WITH MINERALS) tablet Take 1 tablet by mouth daily. Reported on 01/30/2016    . nitroGLYCERIN (NITROSTAT) 0.4 MG SL tablet Place 1 tablet (0.4 mg total) under the tongue every 5 (five) minutes as needed for chest pain. 25 tablet 12  . potassium chloride SA (K-DUR,KLOR-CON) 20 MEQ tablet Take 1 tablet (20 mEq total) by mouth daily. 30 tablet 3  . rosuvastatin (CRESTOR) 40 MG tablet Take 1 tablet (40 mg total) by mouth daily. Please keep 06/29/17 appt for future refills. 90 tablet 3  . sodium chloride (OCEAN) 0.65 % SOLN nasal spray Place 1 spray into both nostrils daily as needed for congestion.     . Testosterone 20.25 MG/1.25GM (1.62%) GEL Apply 2 Squirts topically daily as needed (occasionally). One pump per shoulder.    . traMADol (ULTRAM) 50 MG tablet Take 1 tablet (50 mg total) by mouth every 8 (eight) hours as needed. 50 tablet 1   Facility-Administered Medications Prior to Visit  Medication Dose Route Frequency Provider Last Rate Last Dose  . acetaminophen (TYLENOL) tablet 650 mg  650 mg Oral Q4H PRN Burtis Junes, NP      .  apixaban (ELIQUIS) tablet 5 mg  5 mg Oral BID Burtis Junes, NP         Allergies:   Procardia [nifedipine]   Social History   Social History  . Marital status: Divorced    Spouse name: N/A  . Number of children: 2  . Years of education: Masters   Occupational History  .      retired   Social History Main Topics  . Smoking status: Former Smoker    Packs/day:  2.00    Years: 33.00    Types: Cigarettes    Quit date: 06/17/1989  . Smokeless tobacco: Never Used  . Alcohol use 4.8 oz/week    8 Shots of liquor per week     Comment: 05/12/2017 "bourbon" (when he has it--ran out 05/2017)  . Drug use: No  . Sexual activity: Not Currently   Other Topics Concern  . None   Social History Narrative   Patient is single and lives alone.   Patient is retired.   Patient has a Scientist, water quality.   Patient has two adult children.   Patient is right-handed.   Patient drinks one cup of coffee daily.      Lives in [place] community   Retired Engineer, civil (consulting)     Family History:  The patient's family history includes Arthritis in his sister; CAD in his father and mother; Healthy in his sister; Pancreatic cancer in his father.   ROS:   Please see the history of present illness.    Decreased appetite, decreased ability to ambulate because of weakness and dyspnea. No chest discomfort  All other systems reviewed and are negative.   PHYSICAL EXAM:   VS:  BP 110/68   Pulse 64   Ht 5\' 8"  (1.727 m)   Wt 194 lb (88 kg)   SpO2 92%   BMI 29.50 kg/m    GEN: Well nourished, well developed, in no acute distress . HEENT: normal  Neck: no JVD, carotid bruits, or masses Cardiac: IIRR; no murmurs, rubs, or gallops,no edema  Respiratory:  clear to auscultation bilaterally, normal work of breathing GI: soft, nontender, nondistended, + BS MS: no deformity or atrophy  Skin: warm and dry, no rash Neuro:  Alert and Oriented x 3, Strength and sensation are intact Psych: euthymic mood, full  affect  Wt Readings from Last 3 Encounters:  06/23/17 194 lb (88 kg)  06/10/17 194 lb 9.6 oz (88.3 kg)  05/25/17 192 lb (87.1 kg)      Studies/Labs Reviewed:   EKG:  EKG  Normal sinus rhythm with frequent PACs. Occasional PVC. Poor R-wave progression. Evidence of old inferior infarct.  Recent Labs: 03/23/2017: NT-Pro BNP 4,260 05/12/2017: B Natriuretic Peptide 1,703.1; TSH 1.397 05/14/2017: Magnesium 1.9 05/21/2017: ALT 13; Hemoglobin 14.1; Platelets 158 06/10/2017: BUN 24; Creat 1.40; Potassium 4.1; Sodium 141   Lipid Panel    Component Value Date/Time   CHOL 191 01/09/2016 0001   TRIG 251 (H) 01/09/2016 0001   HDL 56 01/09/2016 0001   CHOLHDL 3.4 01/09/2016 0001   VLDL 50 (H) 01/09/2016 0001   LDLCALC 85 01/09/2016 0001    Additional studies/ records that were reviewed today include:   Cardiac catheterization December 2017: Coronary Diagrams   Diagnostic Diagram        Echocardiogram October 2017:  Study Conclusions  - Left ventricle: The cavity size was moderately dilated. Systolic   function was mildly to moderately reduced. The estimated ejection   fraction was in the range of 40% to 45%. Diffuse hypokinesis.   Features are consistent with a pseudonormal left ventricular   filling pattern, with concomitant abnormal relaxation and   increased filling pressure (grade 2 diastolic dysfunction). - Aortic valve: Moderately calcified annulus. Trileaflet;   moderately thickened, moderately calcified leaflets. There was   mild stenosis. Valve area (VTI): 1.14 cm^2. Valve area (Vmax):   1.18 cm^2. Valve area (Vmean): 1.08 cm^2. - Mitral valve: There was mild regurgitation.   ASSESSMENT:    1.  Chronic combined systolic and diastolic CHF (congestive heart failure) (HCC)   2. Paroxysmal atrial fibrillation ([place])   3. Coronary artery disease involving coronary bypass graft of native heart with angina pectoris ([place])   4. Chronic anticoagulation   5. Essential  hypertension, benign      PLAN:  In order of problems listed above:  1. Recurrent acute on chronic combined systolic and diastolic heart failure. We'll increase furosemide to 80 mg daily. We will use furosemide 80 mg twice today. Extra potassium be taken this evening. A sig metabolic panel today along with BNP. Follow-up as already planned in 6 days. On return a basic metabolic panel should be done. 2. No evidence of recurrent atrial fibrillation. Irregularly irregular rhythm is due to frequent PACs. 3. No anginal complaints. Dyspnea may be an anginal equivalent. Relatively recent cath demonstrated patent bypass grafts. 4. Continue Eliquis 5. Excellent blood pressure control.  Discussion with the patient and daughter concerning his disease process. It was described as decompensating chronic combined systolic heart failure. Increase in furosemide dose is recommended. Follow-up in one week. Blood work today. He will call us tomorrow and let us know whether or not he feels better.  Medication Adjustments/Labs and Tests Ordered: Current medicines are reviewed at length with the patient today.  Concerns regarding medicines are outlined above.  Medication changes, Labs and Tests ordered today are listed in the Patient Instructions below. There are no Patient Instructions on file for this visit.   Signed, Sinclair Grooms, MD  06/23/2017 8:20 AM    [facility] [place], [place], [place]  [address] Phone: [phone]; Fax: [phone]

## 2017-06-23 NOTE — Patient Instructions (Signed)
Medication Instructions:  1) INCREASE Furosemide to 80mg  once daily. 2) TODAY ONLY- take an extra Potassium this evening. 3) If you are still short of breath this evening, you may take an extra Furosemide 80mg   Labwork: BMET, CBC and BNP today  You will have a repeat BMET when you come back to see [name] in a week.  Testing/Procedures: None  Follow-Up: Keep your current follow up with Truitt Merle, NP on 7/16.   Any Other Special Instructions Will Be Listed Below (If Applicable).     If you need a refill on your cardiac medications before your next appointment, please call your pharmacy.

## 2017-06-24 ENCOUNTER — Telehealth: Payer: Self-pay | Admitting: Interventional Cardiology

## 2017-06-24 DIAGNOSIS — I5043 Acute on chronic combined systolic (congestive) and diastolic (congestive) heart failure: Secondary | ICD-10-CM | POA: Diagnosis not present

## 2017-06-24 DIAGNOSIS — I110 Hypertensive heart disease with heart failure: Secondary | ICD-10-CM | POA: Diagnosis not present

## 2017-06-24 NOTE — Telephone Encounter (Signed)
New message    Aaron Berg from [facility] is calling. Aaron Berg has been taking lasix as ordered since being changed. She states Aaron Berg is having cramping in calves.   Aaron Berg c/o BP issue: STAT if Aaron Berg c/o blurred vision, one-sided weakness or slurred speech  1. What are your last 5 BP readings? 92/62   2. Are you having any other symptoms (ex. Dizziness, headache, blurred vision, passed out)? No   3. What is your BP issue? Nurse calling to report slightly lower bp.

## 2017-06-24 NOTE — Telephone Encounter (Signed)
Returning call to [place] from [place]. She was calling just to let us know that the patient's BP was slightly lower than usual since increasing his lasix. BP was 92/62. She states that he was having some slight cramping in his LE. She states that he is not having any other symptoms. Advised for patient to continue to monitor BP, let us know if his symptoms worsen, and to keep appointment on Monday. She verbalized understanding and said that she would communicate that to the patient.

## 2017-06-25 ENCOUNTER — Telehealth: Payer: Self-pay | Admitting: Interventional Cardiology

## 2017-06-25 DIAGNOSIS — R0602 Shortness of breath: Secondary | ICD-10-CM

## 2017-06-25 DIAGNOSIS — G4733 Obstructive sleep apnea (adult) (pediatric): Secondary | ICD-10-CM

## 2017-06-25 NOTE — Telephone Encounter (Signed)
Spoke with Dr. Tamala Julian and he would like for pt to have ONO with CPAP.  Spoke with Melissa at [facility] and made her aware.

## 2017-06-25 NOTE — Telephone Encounter (Signed)
Received call back from [facility] at [facility] that we did need to the order to specify with CPAP.  Placed new order in EPIC for ONO with CPAP.

## 2017-06-25 NOTE — Telephone Encounter (Signed)
Aaron Berg with Aaron Berg calling, she has a question about the order for an overnight pulse oximetry. Aaron Berg would like to know if it should be performed with cpap or not? Please call, thanks.

## 2017-06-25 NOTE — Addendum Note (Signed)
Addended by: Loren Racer on: 06/25/2017 12:08 PM   Modules accepted: Orders

## 2017-06-25 NOTE — Telephone Encounter (Signed)
Left message to call back  

## 2017-06-29 ENCOUNTER — Ambulatory Visit (INDEPENDENT_AMBULATORY_CARE_PROVIDER_SITE_OTHER): Payer: Medicare Other | Admitting: Nurse Practitioner

## 2017-06-29 ENCOUNTER — Encounter: Payer: Self-pay | Admitting: Nurse Practitioner

## 2017-06-29 VITALS — BP 118/80 | HR 73 | Ht 68.0 in | Wt 189.1 lb

## 2017-06-29 DIAGNOSIS — I480 Paroxysmal atrial fibrillation: Secondary | ICD-10-CM | POA: Diagnosis not present

## 2017-06-29 DIAGNOSIS — I5042 Chronic combined systolic (congestive) and diastolic (congestive) heart failure: Secondary | ICD-10-CM | POA: Diagnosis not present

## 2017-06-29 DIAGNOSIS — I2510 Atherosclerotic heart disease of native coronary artery without angina pectoris: Secondary | ICD-10-CM

## 2017-06-29 MED ORDER — FUROSEMIDE 80 MG PO TABS: 80.0000 mg | ORAL_TABLET | Freq: Two times a day (BID) | ORAL | 3 refills | Status: DC

## 2017-06-29 MED ORDER — POTASSIUM CHLORIDE CRYS ER 20 MEQ PO TBCR: 20.0000 meq | EXTENDED_RELEASE_TABLET | Freq: Two times a day (BID) | ORAL | 3 refills | Status: DC

## 2017-06-29 NOTE — Patient Instructions (Addendum)
We will be checking the following labs today - BMET and BNP   Medication Instructions:    Continue with your current medicines. BUT  I am increasing the Lasix to 80 mg twice a day every day.   I am increasing the potassium to twice a day    Testing/Procedures To Be Arranged:  N/A  Follow-Up:   See Dr. Tamala Julian or me in one month    Other Special Instructions:   N/A    If you need a refill on your cardiac medications before your next appointment, please call your pharmacy.   Call the [place] office at [phone] if you have any questions, problems or concerns.

## 2017-06-29 NOTE — Progress Notes (Signed)
CARDIOLOGY OFFICE NOTE  Date:  06/29/2017    Aaron Berg Date of Birth: 1936-04-09 Medical Record #[id number]  PCP:  [name], MD  Cardiologist:  Jennings Books    Chief Complaint  Patient presents with  . Congestive Heart Failure    Follow up visit - seen for Dr. Tamala Julian    History of Present Illness: Aaron Berg is a 81 y.o. male who presents today for a follow up visit.   He has a history of CAD s/p CABG in 2003, prior cath in 2012 with patent LIMA to LAD, patent SVG to RCA and 50% ostial stenosis of SVG to LCx and EF being 35% at that time. Also has chronic combined CHF, mild aortic stenosis/mitral regurg by echo in 09/2016, HTN, HLD, pAfib on eliquis, OSA on CPAP, COPD, microvascular dementia, CVA, and falls.   Seen earlier this month for increased DOE, orthopnea and swelling. Lasix was increased for a few days. He was to keep his follow up here with me for later in the month.   Comes in today. Here with his daughter. He is still not doing that great. Having to use extra Lasix pretty much every day since he was last here along with the daily dose of 80mg . No chest pain. Still short of breath. Bloated and notes that he fills up very quickly when eating. More fatigued.   Past Medical History:  Diagnosis Date  . Aortic stenosis   . Arthritis    "thumbs, wrists, legs, spine" (05/12/2017)  . Atrial flutter ([place])   . Benign prostatic hyperplasia (BPH) with urinary urgency   . CAD (coronary artery disease)    a. s/p CABG 2003.  [name] Cerebrovascular disease 2006   multiple ischemic changes on prior MRI  . Chronic combined systolic and diastolic CHF (congestive heart failure) ([place])   . Chronic lower back pain   . Complication of anesthesia    "woke up too soon once; had 2nd stroke after right knee replacement"  . COPD (chronic obstructive pulmonary disease) ([place])    "no signs or tests"  . CVA (cerebral vascular accident) ([place]) late 1990s; 01/2014  . Dementia     . Depression   . Dry eyes   . Dyslipidemia    well controlled  . Gait disorder   . Heart murmur    "found in ~ 1946"  . Hypertension   . Hypertensive cardiovascular disease   . Male hypogonadism   . Migraine    "major one when I was a kid; very painful"  . Mitral regurgitation   . Multiple falls    "after both knees surgeries; went to rehab; came out and fell all the time; I'm much better now" (05/12/2017)  . Obesity   . OSA on CPAP    cpap  . Paroxysmal atrial fibrillation (HCC)   . Skin cancer of anterior chest   . Spinal stenosis     Past Surgical History:  Procedure Laterality Date  . APPENDECTOMY  as child  . CARDIAC CATHETERIZATION  11/2003   Archie Endo 04/29/2011  . CARDIAC CATHETERIZATION N/A 12/03/2016   Procedure: Left Heart Cath and Cors/Grafts Angiography;  Surgeon: Belva Crome, MD;  Location: [place] [facility] LAB;  Service: Cardiovascular;  Laterality: N/A;  . CARDIOVERSION N/A 04/26/2014   Procedure: CARDIOVERSION;  Surgeon: Sinclair Grooms, MD;  Location: [facility] ENDOSCOPY;  Service: Cardiovascular;  Laterality: N/A;  . CARDIOVERSION N/A 05/24/2014   Procedure: CARDIOVERSION;  Surgeon: Sinclair Grooms, MD;  Location: [facility] ENDOSCOPY;  Service: Cardiovascular;  Laterality: N/A;  . CATARACT EXTRACTION W/ INTRAOCULAR LENS  IMPLANT, BILATERAL Bilateral 2000s  . CORONARY ANGIOPLASTY  1991   "no stents" (10/06/2016)  . CORONARY ARTERY BYPASS GRAFT  11/2002   X 3/notes 04/29/2011  . CYST EXCISION     lumbar; Dr. Joya Salm  . EYE SURGERY Right    detached retina  . JOINT REPLACEMENT    . MOHS SURGERY     on chest  . RETINAL DETACHMENT SURGERY Right   . SHOULDER SURGERY Right    with murphy, reattach ligaments  . TONSILLECTOMY  as child  . TOTAL KNEE ARTHROPLASTY Right 01/02/2014   Procedure: RIGHT TOTAL KNEE ARTHROPLASTY;  Surgeon: Mauri Pole, MD;  Location: [facility] ORS;  Service: Orthopedics;  Laterality: Right;  . TOTAL KNEE ARTHROPLASTY Left 01/31/2014   Procedure: LEFT  TOTAL KNEE ARTHROPLASTY;  Surgeon: Mauri Pole, MD;  Location: [facility] ORS;  Service: Orthopedics;  Laterality: Left;  . TOTAL SHOULDER ARTHROPLASTY Right   . VIDEO ASSISTED THORACOSCOPY (VATS)/THOROCOTOMY  2003   benign nodule     Medications: Current Meds  Medication Sig  . amiodarone (PACERONE) 200 MG tablet Take 1 tablet (200 mg total) by mouth daily.  . Budesonide (RHINOCORT ALLERGY NA) Place 1 spray into the nose daily.  . cetirizine (ZYRTEC) 10 MG tablet Take 10 mg by mouth daily.  [name] donepezil (ARICEPT) 23 MG TABS tablet TAKE 1 TABLET AT BEDTIME  . DULoxetine (CYMBALTA) 30 MG capsule Take 1 capsule (30 mg total) by mouth daily.  [name] ELIQUIS 5 MG TABS tablet TAKE 1 TABLET TWICE A DAY  . feeding supplement, ENSURE ENLIVE, (ENSURE ENLIVE) LIQD Take 237 mLs by mouth 2 (two) times daily between meals.  . finasteride (PROSCAR) 5 MG tablet Take 1 tablet (5 mg total) by mouth daily.  . folic acid (FOLVITE) 1 MG tablet Take 1 tablet (1 mg total) by mouth daily.  . furosemide (LASIX) 80 MG tablet Take 1 tablet (80 mg total) by mouth 2 (two) times daily.  . Multiple Vitamins-Minerals (MULTIVITAMIN WITH MINERALS) tablet Take 1 tablet by mouth daily. Reported on 01/30/2016  . nitroGLYCERIN (NITROSTAT) 0.4 MG SL tablet Place 1 tablet (0.4 mg total) under the tongue every 5 (five) minutes as needed for chest pain.  . polyethylene glycol (MIRALAX / GLYCOLAX) packet Take 17 g by mouth daily.  . potassium chloride SA (K-DUR,KLOR-CON) 20 MEQ tablet Take 1 tablet (20 mEq total) by mouth 2 (two) times daily.  . rosuvastatin (CRESTOR) 40 MG tablet Take 1 tablet (40 mg total) by mouth daily. Please keep 06/29/17 appt for future refills.  . sodium chloride (OCEAN) 0.65 % SOLN nasal spray Place 1 spray into both nostrils daily as needed for congestion.   . Testosterone 20.25 MG/1.25GM (1.62%) GEL Apply 2 Squirts topically daily as needed (occasionally). One pump per shoulder.  . traMADol (ULTRAM) 50 MG tablet  Take 1 tablet (50 mg total) by mouth every 8 (eight) hours as needed.  . [DISCONTINUED] furosemide (LASIX) 80 MG tablet Take 1 tablet (80 mg total) by mouth daily.  . [DISCONTINUED] potassium chloride SA (K-DUR,KLOR-CON) 20 MEQ tablet Take 1 tablet (20 mEq total) by mouth daily.     Allergies: Allergies  Allergen Reactions  . Procardia [Nifedipine] Rash    All over body    Social History: The patient  reports that he quit smoking about 28 years ago. His smoking use  included Cigarettes. He has a 66.00 pack-year smoking history. He has never used smokeless tobacco. He reports that he drinks about 4.8 oz of alcohol per week . He reports that he does not use drugs.   Family History: The patient's family history includes Arthritis in his sister; CAD in his father and mother; Healthy in his sister; Pancreatic cancer in his father.   Review of Systems: Please see the history of present illness.   Otherwise, the review of systems is positive for none.   All other systems are reviewed and negative.   Physical Exam: VS:  BP 118/80 (BP Location: Left Arm, Patient Position: Sitting, Cuff Size: Normal)   Pulse 73   Ht 5\' 8"  (1.727 m)   Wt 189 lb 1.9 oz (85.8 kg)   SpO2 97% Comment: at rest  BMI 28.76 kg/m  .  BMI Body mass index is 28.76 kg/m.  Wt Readings from Last 3 Encounters:  06/29/17 189 lb 1.9 oz (85.8 kg)  06/23/17 194 lb (88 kg)  06/10/17 194 lb 9.6 oz (88.3 kg)    General: Pleasant - little confused. Elderly male. Has lost 5 pounds. Alert and in no acute distress. HEENT: Normal.  Neck: Supple, no JVD, carotid bruits, or masses noted.  Cardiac: Regular rate and rhythm. With frequent ectopic noted every 3rd beat. Harsh outflow murmur noted. No edema.  Respiratory:  Lungs are clear to auscultation bilaterally with normal work of breathing.  GI: Abdomen seems a little full.   MS: No deformity or atrophy. Gait and ROM intact but he is unsteady.  Skin: Warm and dry. Color is  normal.  Neuro:  Strength and sensation are intact and no gross focal deficits noted.  Psych: Alert, appropriate and with normal affect.   LABORATORY DATA:  EKG:  EKG is not ordered today.  Lab Results  Component Value Date   WBC 6.5 06/23/2017   HGB 14.2 06/23/2017   HCT 42.4 06/23/2017   PLT 167 06/23/2017   GLUCOSE 89 06/23/2017   CHOL 191 01/09/2016   TRIG 251 (H) 01/09/2016   HDL 56 01/09/2016   LDLCALC 85 01/09/2016   ALT 13 05/21/2017   AST 20 05/21/2017   NA 140 06/23/2017   K 4.2 06/23/2017   CL 100 06/23/2017   CREATININE 1.37 (H) 06/23/2017   BUN 24 06/23/2017   CO2 23 06/23/2017   TSH 1.397 05/12/2017   PSA 4.91 (H) 03/29/2015   PSA 5.07 (H) 03/29/2015   INR 1.1 11/28/2016   HGBA1C 6.1 (H) 08/15/2014     BNP (last 3 results)  Recent Labs  05/12/17 1938  BNP 1,703.1*    ProBNP (last 3 results)  Recent Labs  03/04/17 0924 03/23/17 1452 06/23/17 0840  PROBNP 5,228* 4,260* 8,711*     Other Studies Reviewed Today: Cardiac catheterization December 2017:  Left Heart Cath and Cors/Grafts Angiography  Conclusion     Ost Cx lesion, 100 %stenosed.    Severe native vessel coronary disease with total occlusion of the right coronary artery, 99% stenosis of the entire left main and ostial LAD, and total occlusion of the circumflex.  Patent saphenous vein graft to the circumflex/ramus, Patent saphenous vein graft to the PDA, and patent LIMA to the LAD.  No gradient across the aortic valve. Elevated left ventricular end-diastolic pressure 26 mmHg.  Abnormal myocardial perfusion study likely represents myocardial infarction dating to pre-bypass surgery.  RECOMMENDATIONS:   Continue medical therapy  Angio-Seal deployed. If no difficulty with femoral  access may be discharged later today.     Coronary Diagrams   Diagnostic Diagram        Echocardiogram October 2017:  Study Conclusions  - Left ventricle: The cavity size was  moderately dilated. Systolic function was mildly to moderately reduced. The estimated ejection fraction was in the range of 40% to 45%. Diffuse hypokinesis. Features are consistent with a pseudonormal left ventricular filling pattern, with concomitant abnormal relaxation and increased filling pressure (grade 2 diastolic dysfunction). - Aortic valve: Moderately calcified annulus. Trileaflet; moderately thickened, moderately calcified leaflets. There was mild stenosis. Valve area (VTI): 1.14 cm^2. Valve area (Vmax): 1.18 cm^2. Valve area (Vmean): 1.08 cm^2. - Mitral valve: There was mild regurgitation.   Assessment/Plan:  1. Acute on chronic combined systolic and diastolic heart failure. Needs repeat lab today. Unclear as to the etiology for this recent turn of events - he has had some extra salt - but I am more worried that his EF is falling or he is developing worsening AS. Will get his labs repeated. Get the echo updated. Increasing the Lasix to 80 mg BID and potassium to BID.   2. PAF -on amiodarone - holding in sinus.   3. Chronic anticoagulation - recent CBC noted.   4. HTN - BP is fine.   5. Mild AS - murmur sounds louder to me than just mild AS - getting echo updated.    Current medicines are reviewed with the patient today.  The patient does not have concerns regarding medicines other than what has been noted above.  The following changes have been made:  See above.  Labs/ tests ordered today include:    Orders Placed This Encounter  Procedures  . Basic metabolic panel  . Pro b natriuretic peptide (BNP)  . ECHOCARDIOGRAM COMPLETE     Disposition:   FU with me or Dr. Tamala Julian in one month.    Patient is agreeable to this plan and will call if any problems develop in the interim.   SignedTruitt Merle, NP  06/29/2017 3:37 PM  [place] [address] [place], [place]  [address] Phone: [phone] Fax:  [phone]

## 2017-06-30 ENCOUNTER — Other Ambulatory Visit: Payer: Self-pay | Admitting: *Deleted

## 2017-06-30 ENCOUNTER — Encounter: Payer: Self-pay | Admitting: Nurse Practitioner

## 2017-06-30 DIAGNOSIS — R0602 Shortness of breath: Secondary | ICD-10-CM | POA: Diagnosis not present

## 2017-06-30 DIAGNOSIS — G4733 Obstructive sleep apnea (adult) (pediatric): Secondary | ICD-10-CM | POA: Diagnosis not present

## 2017-06-30 DIAGNOSIS — I5043 Acute on chronic combined systolic (congestive) and diastolic (congestive) heart failure: Secondary | ICD-10-CM | POA: Diagnosis not present

## 2017-06-30 DIAGNOSIS — I110 Hypertensive heart disease with heart failure: Secondary | ICD-10-CM | POA: Diagnosis not present

## 2017-06-30 DIAGNOSIS — R748 Abnormal levels of other serum enzymes: Secondary | ICD-10-CM

## 2017-06-30 LAB — BASIC METABOLIC PANEL
BUN/Creatinine Ratio: 27 — ABNORMAL HIGH (ref 10–24)
BUN: 39 mg/dL — ABNORMAL HIGH (ref 8–27)
CO2: 28 mmol/L (ref 20–29)
Calcium: 9.7 mg/dL (ref 8.6–10.2)
Chloride: 96 mmol/L (ref 96–106)
Creatinine, Ser: 1.46 mg/dL — ABNORMAL HIGH (ref 0.76–1.27)
GFR calc Af Amer: 52 mL/min/{1.73_m2} — ABNORMAL LOW (ref >59)
GFR calc non Af Amer: 45 mL/min/{1.73_m2} — ABNORMAL LOW (ref >59)
Glucose: 129 mg/dL — ABNORMAL HIGH (ref 65–99)
Potassium: 4.4 mmol/L (ref 3.5–5.2)
Sodium: 141 mmol/L (ref 134–144)

## 2017-06-30 LAB — PRO B NATRIURETIC PEPTIDE: NT-Pro BNP: 6585 pg/mL — ABNORMAL HIGH (ref 0–486)

## 2017-07-01 ENCOUNTER — Encounter: Payer: Self-pay | Admitting: Interventional Cardiology

## 2017-07-01 DIAGNOSIS — G4733 Obstructive sleep apnea (adult) (pediatric): Secondary | ICD-10-CM | POA: Diagnosis not present

## 2017-07-01 DIAGNOSIS — R0602 Shortness of breath: Secondary | ICD-10-CM | POA: Diagnosis not present

## 2017-07-06 ENCOUNTER — Ambulatory Visit
Admission: RE | Admit: 2017-07-06 | Discharge: 2017-07-06 | Disposition: A | Discharge status: Home / Self Care | Payer: Medicare Other | Source: Ambulatory Visit | Attending: Medical | Admitting: Medical

## 2017-07-06 ENCOUNTER — Other Ambulatory Visit: Payer: Medicare Other | Admitting: *Deleted

## 2017-07-06 ENCOUNTER — Encounter: Payer: Self-pay | Admitting: Medical

## 2017-07-06 ENCOUNTER — Ambulatory Visit (HOSPITAL_COMMUNITY): Payer: Medicare Other | Attending: Cardiovascular Disease

## 2017-07-06 ENCOUNTER — Other Ambulatory Visit: Payer: Self-pay

## 2017-07-06 ENCOUNTER — Ambulatory Visit (INDEPENDENT_AMBULATORY_CARE_PROVIDER_SITE_OTHER): Payer: Medicare Other | Admitting: Medical

## 2017-07-06 VITALS — BP 128/68 | HR 82 | Wt 188.0 lb

## 2017-07-06 DIAGNOSIS — I5042 Chronic combined systolic (congestive) and diastolic (congestive) heart failure: Secondary | ICD-10-CM

## 2017-07-06 DIAGNOSIS — R748 Abnormal levels of other serum enzymes: Secondary | ICD-10-CM | POA: Diagnosis not present

## 2017-07-06 DIAGNOSIS — R0789 Other chest pain: Secondary | ICD-10-CM | POA: Diagnosis not present

## 2017-07-06 DIAGNOSIS — R0781 Pleurodynia: Secondary | ICD-10-CM | POA: Diagnosis not present

## 2017-07-06 DIAGNOSIS — M954 Acquired deformity of chest and rib: Secondary | ICD-10-CM | POA: Diagnosis not present

## 2017-07-06 DIAGNOSIS — I340 Nonrheumatic mitral (valve) insufficiency: Secondary | ICD-10-CM | POA: Insufficient documentation

## 2017-07-06 DIAGNOSIS — I2510 Atherosclerotic heart disease of native coronary artery without angina pectoris: Secondary | ICD-10-CM | POA: Diagnosis not present

## 2017-07-06 DIAGNOSIS — I517 Cardiomegaly: Secondary | ICD-10-CM | POA: Diagnosis not present

## 2017-07-06 LAB — ECHOCARDIOGRAM COMPLETE: Weight: 3008 oz

## 2017-07-06 MED ORDER — PERFLUTREN LIPID MICROSPHERE
1.0000 mL | INTRAVENOUS | Status: AC | PRN
  Administered 2017-07-06: 3 mL via INTRAVENOUS

## 2017-07-06 NOTE — Progress Notes (Signed)
Subjective: Chief Complaint  Patient presents with  . ribs popping    ribs popping, pain , sore   going into back , not able to turn x 1 weeking    Here with his daughter today who is a Marine scientist at [employer].  He is here for ribs possibly "separating."  He reportedly was morbidly obese in the past, but has lost 80lb down from his heaviest in recent years.  He has heart disease and had recent hospitalization in May for CHF flare.   He has hx/o CABG and VATS as well.   Since his prior surgeries he has had a bon projection at the lower sternum and depending upon how he moves, can make the inferior sternal bony projection  "Pop."    This is not painful though. However in the past week he has had significant pain in his left lower ribs.    He has had 2 occasions when he bent to the left to buckle seat belt, and had immediate unbearable pain in left ribs.  Felt as though the ribs separated or moved.   Even now when bending or turning a certain way can reproduce this pain in left ribs.  No rash.   Denies any recent fall, injury or trauma.    No fever, no edema, no SOB.  He is urinating a lot on diuretics though.  No other aggravating or relieving factors. No other complaint.  Past Medical History:  Diagnosis Date  . Aortic stenosis   . Arthritis    "thumbs, wrists, legs, spine" (05/12/2017)  . Atrial flutter ([place])   . Benign prostatic hyperplasia (BPH) with urinary urgency   . CAD (coronary artery disease)    a. s/p CABG 2003.  [name] Cerebrovascular disease 2006   multiple ischemic changes on prior MRI  . Chronic combined systolic and diastolic CHF (congestive heart failure) ([place])   . Chronic lower back pain   . Complication of anesthesia    "woke up too soon once; had 2nd stroke after right knee replacement"  . COPD (chronic obstructive pulmonary disease) ([place])    "no signs or tests"  . CVA (cerebral vascular accident) ([place]) late 1990s; 01/2014  . Dementia   . Depression   . Dry eyes   .  Dyslipidemia    well controlled  . Gait disorder   . Heart murmur    "found in ~ 1946"  . Hypertension   . Hypertensive cardiovascular disease   . Male hypogonadism   . Migraine    "major one when I was a kid; very painful"  . Mitral regurgitation   . Multiple falls    "after both knees surgeries; went to rehab; came out and fell all the time; I'm much better now" (05/12/2017)  . Obesity   . OSA on CPAP    cpap  . Paroxysmal atrial fibrillation (HCC)   . Skin cancer of anterior chest   . Spinal stenosis    Current Outpatient Prescriptions on File Prior to Visit  Medication Sig Dispense Refill  . amiodarone (PACERONE) 200 MG tablet Take 1 tablet (200 mg total) by mouth daily. 30 tablet 5  . Budesonide (RHINOCORT ALLERGY NA) Place 1 spray into the nose daily.    [name] donepezil (ARICEPT) 23 MG TABS tablet TAKE 1 TABLET AT BEDTIME 90 tablet 1  . DULoxetine (CYMBALTA) 30 MG capsule Take 1 capsule (30 mg total) by mouth daily. 30 capsule 3  . ELIQUIS 5 MG TABS tablet TAKE  1 TABLET TWICE A DAY 180 tablet 3  . feeding supplement, ENSURE ENLIVE, (ENSURE ENLIVE) LIQD Take 237 mLs by mouth 2 (two) times daily between meals. 237 mL 12  . finasteride (PROSCAR) 5 MG tablet Take 1 tablet (5 mg total) by mouth daily. 90 tablet 3  . folic acid (FOLVITE) 1 MG tablet Take 1 tablet (1 mg total) by mouth daily. 30 tablet 1  . furosemide (LASIX) 80 MG tablet Take 1 tablet (80 mg total) by mouth 2 (two) times daily. 90 tablet 3  . Multiple Vitamins-Minerals (MULTIVITAMIN WITH MINERALS) tablet Take 1 tablet by mouth daily. Reported on 01/30/2016    . nitroGLYCERIN (NITROSTAT) 0.4 MG SL tablet Place 1 tablet (0.4 mg total) under the tongue every 5 (five) minutes as needed for chest pain. 25 tablet 12  . polyethylene glycol (MIRALAX / GLYCOLAX) packet Take 17 g by mouth daily.    . potassium chloride SA (K-DUR,KLOR-CON) 20 MEQ tablet Take 1 tablet (20 mEq total) by mouth 2 (two) times daily. 180 tablet 3  .  rosuvastatin (CRESTOR) 40 MG tablet Take 1 tablet (40 mg total) by mouth daily. Please keep 06/29/17 appt for future refills. 90 tablet 3  . sodium chloride (OCEAN) 0.65 % SOLN nasal spray Place 1 spray into both nostrils daily as needed for congestion.     . Testosterone 20.25 MG/1.25GM (1.62%) GEL Apply 2 Squirts topically daily as needed (occasionally). One pump per shoulder.    . traMADol (ULTRAM) 50 MG tablet Take 1 tablet (50 mg total) by mouth every 8 (eight) hours as needed. 50 tablet 1   No current facility-administered medications on file prior to visit.    Past Surgical History:  Procedure Laterality Date  . APPENDECTOMY  as child  . CARDIAC CATHETERIZATION  11/2003   Archie Endo 04/29/2011  . CARDIAC CATHETERIZATION N/A 12/03/2016   Procedure: Left Heart Cath and Cors/Grafts Angiography;  Surgeon: Belva Crome, MD;  Location: [place] [facility] LAB;  Service: Cardiovascular;  Laterality: N/A;  . CARDIOVERSION N/A 04/26/2014   Procedure: CARDIOVERSION;  Surgeon: Sinclair Grooms, MD;  Location[facility] ENDOSCOPY;  Service: Cardiovascular;  Laterality: N/A;  . CARDIOVERSION N/A 05/24/2014   Procedure: CARDIOVERSION;  Surgeon: Sinclair Grooms, MD;  Location: [place];  Service: Cardiovascular;  Laterality: N/A;  . CATARACT EXTRACTION W/ INTRAOCULAR LENS  IMPLANT, BILATERAL Bilateral 2000s  . CORONARY ANGIOPLASTY  1991   "no stents" (10/06/2016)  . CORONARY ARTERY BYPASS GRAFT  11/2002   X 3/notes 04/29/2011  . CYST EXCISION     lumbar; Dr. Joya Salm  . EYE SURGERY Right    detached retina  . JOINT REPLACEMENT    . MOHS SURGERY     on chest  . RETINAL DETACHMENT SURGERY Right   . SHOULDER SURGERY Right    with murphy, reattach ligaments  . TONSILLECTOMY  as child  . TOTAL KNEE ARTHROPLASTY Right 01/02/2014   Procedure: RIGHT TOTAL KNEE ARTHROPLASTY;  Surgeon: Mauri Pole, MD;  Location: [facility];  Service: Orthopedics;  Laterality: Right;  . TOTAL KNEE ARTHROPLASTY Left 01/31/2014    Procedure: LEFT TOTAL KNEE ARTHROPLASTY;  Surgeon: Mauri Pole, MD;  Location: [facility];  Service: Orthopedics;  Laterality: Left;  . TOTAL SHOULDER ARTHROPLASTY Right   . VIDEO ASSISTED THORACOSCOPY (VATS)/THOROCOTOMY  2003   benign nodule     ROS as in subjective  Objective: BP 128/68   Pulse 82   Wt 188 lb (85.3 kg)  SpO2 97%   BMI 28.59 kg/m   Wt Readings from Last 3 Encounters:  07/06/17 188 lb (85.3 kg)  06/29/17 189 lb 1.9 oz (85.8 kg)  06/23/17 194 lb (88 kg)   Gen: wd, wn, white male, seated with cane, trying to remain more upright seated Skin: no rash, no erythema or bruising It appears that his xiphoid and possible lower part of sternum are projecting outwards causing obvious deformity, but nontender.  There is surgical scarring of sternum s/p CABG.   Nontender in this area.   Along left lower ribs laterally there is a bit of an outward diversions of the ribs, and when exhaling, there is a subtle eversion of the lower ribs, fullness in left upper abdomen laterally as well.   He is certainly tender over ribs in this area.    otherwise chest nontender, but there is some mild pain in left lower ribs with respirations or deep breathing. No back tenderness No abdominal tenders, no organomegaly   Assessment: Encounter Diagnoses  Name Primary?  . Rib pain on left side Yes  . Rib deformity   . Chronic combined systolic and diastolic CHF (congestive heart failure) ([place])     Plan: Discussed the unusual findings.   Will send for Chest and rib xray.  Gave script for rib belt for daily use for now,  Discussed posture, avoid leaning or compressing left ribs, use good posture throughout the day.   F/u pending imaging.   Aydenn was seen today for ribs popping.  Diagnoses and all orders for this visit:  Rib pain on left side -     DG Ribs Unilateral Left; Future -     DG Chest 2 View; Future  Rib deformity -     DG Ribs Unilateral Left; Future -     DG Chest 2 View;  Future  Chronic combined systolic and diastolic CHF (congestive heart failure) (HCC) -     DG Ribs Unilateral Left; Future -     DG Chest 2 View; Future

## 2017-07-07 ENCOUNTER — Other Ambulatory Visit: Payer: Self-pay

## 2017-07-07 ENCOUNTER — Telehealth: Payer: Self-pay | Admitting: *Deleted

## 2017-07-07 ENCOUNTER — Encounter: Payer: Self-pay | Admitting: Nurse Practitioner

## 2017-07-07 ENCOUNTER — Ambulatory Visit: Payer: Medicare Other | Admitting: Family Medicine

## 2017-07-07 ENCOUNTER — Telehealth: Payer: Self-pay | Admitting: Internal Medicine

## 2017-07-07 DIAGNOSIS — I5042 Chronic combined systolic (congestive) and diastolic (congestive) heart failure: Secondary | ICD-10-CM

## 2017-07-07 DIAGNOSIS — R079 Chest pain, unspecified: Secondary | ICD-10-CM

## 2017-07-07 DIAGNOSIS — I110 Hypertensive heart disease with heart failure: Secondary | ICD-10-CM | POA: Diagnosis not present

## 2017-07-07 DIAGNOSIS — I5043 Acute on chronic combined systolic (congestive) and diastolic (congestive) heart failure: Secondary | ICD-10-CM | POA: Diagnosis not present

## 2017-07-07 DIAGNOSIS — R0602 Shortness of breath: Secondary | ICD-10-CM

## 2017-07-07 LAB — BASIC METABOLIC PANEL
BUN/Creatinine Ratio: 27 — ABNORMAL HIGH (ref 10–24)
BUN: 42 mg/dL — ABNORMAL HIGH (ref 8–27)
CO2: 22 mmol/L (ref 20–29)
Calcium: 10 mg/dL (ref 8.6–10.2)
Chloride: 100 mmol/L (ref 96–106)
Creatinine, Ser: 1.58 mg/dL — ABNORMAL HIGH (ref 0.76–1.27)
GFR calc Af Amer: 47 mL/min/{1.73_m2} — ABNORMAL LOW (ref >59)
GFR calc non Af Amer: 41 mL/min/{1.73_m2} — ABNORMAL LOW (ref >59)
Glucose: 97 mg/dL (ref 65–99)
Potassium: 5.4 mmol/L — ABNORMAL HIGH (ref 3.5–5.2)
Sodium: 140 mmol/L (ref 134–144)

## 2017-07-07 MED ORDER — LISINOPRIL 2.5 MG PO TABS: 2.5000 mg | ORAL_TABLET | Freq: Every day | ORAL | 3 refills | Status: DC

## 2017-07-07 NOTE — Telephone Encounter (Signed)
-----   Message from Belva Crome, MD sent at 07/06/2017  7:23 PM EDT ----- Let the patient .know the heart is much weaker. Ejection fraction 25%. Start lisinopril 2.5 mg daily and get Bmet and OV in 7 days for further medication titration.  A copy will be sent to [name], MD

## 2017-07-07 NOTE — Telephone Encounter (Signed)
noelle from [facility] called and states pt is having leg cramps and burning in feet. Wants to go down on lasix. Pt told [facility] that he was going to decrease his lasix anyways. Please advise. Pt [phone]

## 2017-07-07 NOTE — Telephone Encounter (Signed)
Daughter spoke with pt and he told her that he did not wear his CPAP during the ONO.  Daughter wanted to know since he didn't, should he wear CPAP and O2 at night once it's received or should he just try wearing one or the other?  Daughter states right now he needs new supplies so he hasn't been wearing his CPAP like he is suppose to.  Advised I would send message to Dr. Tamala Julian and update him that pt wasn't wearing CPAP and get recommendations.  Daughter appreciative for call.

## 2017-07-07 NOTE — Telephone Encounter (Signed)
Pt has been notified of lab results by phone with verbal understanding to recommendations: decrease K+ to 20 meq once a day, bmet to be done 7/31 at ov with Nell Range, [place]. Pt is also aware of worsening EF on echo. Pt aware Truitt Merle, NP has has asked for further input from Dr. Tamala Julian as to if pt needs further testing at this time or wait until he see's Dr. Tamala Julian. Pt thanked me for my call. Results have been sent to [facility].

## 2017-07-07 NOTE — Telephone Encounter (Signed)
Spoke with daughter, Sharyn Lull ([place]), and made her aware of echo results and recommendations per Dr. Tamala Julian.  Also made her aware of ONO results and recommendations.  Daughter verbalized understanding and was in agreement with this plan.  Pt will come in to see Bonney Leitz, PA-C 7/31 and have BMET the same day.  Advised daughter I would call [facility] in regards to getting O2 for pt.  Daughter verbalized understanding and was appreciative for call.   Called and left message at [facility] to call back.

## 2017-07-07 NOTE — Telephone Encounter (Signed)
Follow Up Call: Mr. Speckman Daughter is calling because her dad was not using the C-pap machine when he did the take home Pulse Ox test . Please call

## 2017-07-07 NOTE — Telephone Encounter (Signed)
-----   Message from Burtis Junes, NP sent at 07/07/2017  5:47 PM EDT ----- Agree with plan as outlined by Nicki Reaper - also ok to let his and his daughter know that the echo shows worsening pumping function - I have asked for Dr. Tamala Julian to give his opinion as to whether we need further testing at this time or wait til Dr. Tamala Julian sees him in August.

## 2017-07-08 NOTE — Telephone Encounter (Signed)
He should stay on his present dosing regimen

## 2017-07-08 NOTE — Telephone Encounter (Signed)
Unfortunate circumstance. Wear oxygen and CPAP for now. Repeat study, ONO, on oxgen and CPAP in 1 month.

## 2017-07-08 NOTE — Telephone Encounter (Signed)
Spoke with daughter and made her aware of recommendations per Dr. Tamala Julian.  Daughter verbalized understanding and was in agreement with this plan.  Daughter will let me know when pt receives CPAP supplies so that I can order ONO.  They were having an issue with insurance and getting the supplies.  Also checked wit daughter and she did get my message earlier about pt's K+.  Daughter very appreciative for call.

## 2017-07-08 NOTE — Telephone Encounter (Signed)
Noelle informed word for word

## 2017-07-09 ENCOUNTER — Telehealth: Payer: Self-pay | Admitting: Interventional Cardiology

## 2017-07-09 NOTE — Telephone Encounter (Signed)
Spoke with Melissa and she states there are some forms that need to be filled out before they can get O2 to pt.  They were faxed over yesterday afternoon.  Advised Melissa I would let her know if I do not receive paperwork with morning drop off from medical records.

## 2017-07-09 NOTE — Telephone Encounter (Signed)
New message     Melissa with [facility] needs additional information on the patients oxygen order. [facility] states the order is incomplete and requests a call back.

## 2017-07-10 ENCOUNTER — Other Ambulatory Visit: Payer: Self-pay

## 2017-07-10 ENCOUNTER — Telehealth: Payer: Self-pay | Admitting: Interventional Cardiology

## 2017-07-10 NOTE — Telephone Encounter (Signed)
New message    [facility] needs unfinished paperwork about Oxygen before they can deliver it.  Leave Message on voicemail .  [facility] said an official order was not sent to them.

## 2017-07-10 NOTE — Telephone Encounter (Signed)
Patient's daughter, Sharyn [facility]), was calling about O2 orders. Explained to patient's daughter that patient has an appointment next week to be evaluated for home O2. We have received fax from [facility] for orders. Will leave forms in Dr. Thompson Caul box for them to have on Tuesday and patient's appointment.  Patient's daughter verbalized understanding.

## 2017-07-13 ENCOUNTER — Other Ambulatory Visit: Payer: Self-pay | Admitting: Family Medicine

## 2017-07-13 ENCOUNTER — Ambulatory Visit
Admission: RE | Admit: 2017-07-13 | Discharge: 2017-07-13 | Disposition: A | Discharge status: Home / Self Care | Payer: Medicare Other | Source: Ambulatory Visit | Attending: Medical | Admitting: Medical

## 2017-07-13 ENCOUNTER — Other Ambulatory Visit: Payer: Medicare Other

## 2017-07-13 DIAGNOSIS — J9811 Atelectasis: Secondary | ICD-10-CM | POA: Diagnosis not present

## 2017-07-13 DIAGNOSIS — R079 Chest pain, unspecified: Secondary | ICD-10-CM

## 2017-07-13 MED ORDER — IOPAMIDOL (ISOVUE-300) INJECTION 61%
75.0000 mL | Freq: Once | INTRAVENOUS | Status: AC | PRN
  Administered 2017-07-13: 75 mL via INTRAVENOUS

## 2017-07-13 NOTE — Telephone Encounter (Signed)
Is this okay to refill? 

## 2017-07-13 NOTE — Telephone Encounter (Signed)
ok 

## 2017-07-13 NOTE — Telephone Encounter (Signed)
I have called in tramadol per [facility]

## 2017-07-14 ENCOUNTER — Other Ambulatory Visit: Payer: Medicare Other

## 2017-07-14 ENCOUNTER — Ambulatory Visit: Payer: Medicare Other | Admitting: Physician Assistant

## 2017-07-16 ENCOUNTER — Telehealth: Payer: Self-pay | Admitting: Family Medicine

## 2017-07-16 ENCOUNTER — Telehealth: Payer: Self-pay | Admitting: Interventional Cardiology

## 2017-07-16 DIAGNOSIS — I5043 Acute on chronic combined systolic (congestive) and diastolic (congestive) heart failure: Secondary | ICD-10-CM | POA: Diagnosis not present

## 2017-07-16 DIAGNOSIS — I110 Hypertensive heart disease with heart failure: Secondary | ICD-10-CM | POA: Diagnosis not present

## 2017-07-16 NOTE — Telephone Encounter (Signed)
Aaron Berg from [employer] called when computers where down t# [phone], I called her back and left a message

## 2017-07-16 NOTE — Telephone Encounter (Signed)
[facility] is calling because Mr.Habeeb is needing further nursing orders 1wk x4 . He is having some decreasing sensations and calf with some neurothy pain over the last week and she is want to know if he can have Neurontin and a Physical Therapy. He has not received the oxygen that was ordered and she is wondering if something else needs to be sent and also he is reporting hot flashes , worsen memory, vivid dreams and having a popping and tearing sensation in his rib cage . The last thing is that he is remembering to take his 80 mg of lasix twice daily as order. Please call

## 2017-07-16 NOTE — Telephone Encounter (Signed)
Returned Ena Dawley with [facility] call back re: her call about pt and his neuropathy pain and request for Neurontin, and a request for PT for this. I have advised Ena Dawley to contact pt PCP re: this request, since pt hasn't been seen for this.  She asked about pts O2 and I advised her that the pt needs to be seen so we can get the documentation that's needed for the request, per Medicare, and that the pt was scheduled next week.  Ena Dawley thanked me for my quick response and advised that she would reach out to pts pcp.

## 2017-07-20 ENCOUNTER — Telehealth: Payer: Self-pay | Admitting: Family Medicine

## 2017-07-20 DIAGNOSIS — I110 Hypertensive heart disease with heart failure: Secondary | ICD-10-CM | POA: Diagnosis not present

## 2017-07-20 DIAGNOSIS — I5043 Acute on chronic combined systolic (congestive) and diastolic (congestive) heart failure: Secondary | ICD-10-CM | POA: Diagnosis not present

## 2017-07-20 NOTE — Telephone Encounter (Signed)
Okay 

## 2017-07-20 NOTE — Telephone Encounter (Signed)
Doug at [facility], physical therapy called stating that pt was evaluated and [name] is requesting an order for PT 2 times a week for 4 weeks. Call Doug at [phone]

## 2017-07-21 DIAGNOSIS — I110 Hypertensive heart disease with heart failure: Secondary | ICD-10-CM | POA: Diagnosis not present

## 2017-07-21 DIAGNOSIS — I5043 Acute on chronic combined systolic (congestive) and diastolic (congestive) heart failure: Secondary | ICD-10-CM | POA: Diagnosis not present

## 2017-07-21 NOTE — Telephone Encounter (Signed)
Called Doug to inform him okay per [employer]

## 2017-07-22 ENCOUNTER — Encounter: Payer: Self-pay | Admitting: Nurse Practitioner

## 2017-07-22 ENCOUNTER — Telehealth: Payer: Self-pay | Admitting: *Deleted

## 2017-07-22 ENCOUNTER — Other Ambulatory Visit: Payer: Medicare Other

## 2017-07-22 ENCOUNTER — Ambulatory Visit (INDEPENDENT_AMBULATORY_CARE_PROVIDER_SITE_OTHER): Payer: Medicare Other | Admitting: Nurse Practitioner

## 2017-07-22 VITALS — BP 98/70 | HR 99 | Ht 68.0 in | Wt 192.4 lb

## 2017-07-22 DIAGNOSIS — I5042 Chronic combined systolic (congestive) and diastolic (congestive) heart failure: Secondary | ICD-10-CM | POA: Diagnosis not present

## 2017-07-22 DIAGNOSIS — I2510 Atherosclerotic heart disease of native coronary artery without angina pectoris: Secondary | ICD-10-CM

## 2017-07-22 DIAGNOSIS — I480 Paroxysmal atrial fibrillation: Secondary | ICD-10-CM

## 2017-07-22 DIAGNOSIS — Z7901 Long term (current) use of anticoagulants: Secondary | ICD-10-CM

## 2017-07-22 DIAGNOSIS — I5043 Acute on chronic combined systolic (congestive) and diastolic (congestive) heart failure: Secondary | ICD-10-CM | POA: Diagnosis not present

## 2017-07-22 DIAGNOSIS — I110 Hypertensive heart disease with heart failure: Secondary | ICD-10-CM | POA: Diagnosis not present

## 2017-07-22 LAB — BASIC METABOLIC PANEL
BUN/Creatinine Ratio: 28 — ABNORMAL HIGH (ref 10–24)
BUN: 39 mg/dL — ABNORMAL HIGH (ref 8–27)
CO2: 25 mmol/L (ref 20–29)
Calcium: 9.6 mg/dL (ref 8.6–10.2)
Chloride: 98 mmol/L (ref 96–106)
Creatinine, Ser: 1.41 mg/dL — ABNORMAL HIGH (ref 0.76–1.27)
GFR calc Af Amer: 54 mL/min/{1.73_m2} — ABNORMAL LOW (ref >59)
GFR calc non Af Amer: 47 mL/min/{1.73_m2} — ABNORMAL LOW (ref >59)
Glucose: 83 mg/dL (ref 65–99)
Potassium: 4.7 mmol/L (ref 3.5–5.2)
Sodium: 141 mmol/L (ref 134–144)

## 2017-07-22 LAB — PRO B NATRIURETIC PEPTIDE: NT-Pro BNP: 6747 pg/mL — ABNORMAL HIGH (ref 0–486)

## 2017-07-22 NOTE — Patient Instructions (Addendum)
We will be checking the following labs today - BMET and BNP   Medication Instructions:    Continue with your current medicines.     Testing/Procedures To Be Arranged:  N/A  Follow-Up:   See Dr. Tamala Julian next week as planned.     Other Special Instructions:   You do not qualify for oxygen at this time - I will let the home health agency know    If you need a refill on your cardiac medications before your next appointment, please call your pharmacy.   Call the [place] office at [phone] if you have any questions, problems or concerns.

## 2017-07-22 NOTE — Progress Notes (Signed)
CARDIOLOGY OFFICE NOTE  Date:  07/22/2017    Aaron Berg Date of Birth: [birth year] Medical Record #[id number]  PCP:  [name], MD  Cardiologist:  Jennings Books    Chief Complaint  Patient presents with  . Congestive Heart Failure    Follow up visit - seen for Dr. Tamala Julian    History of Present Illness: Aaron Berg is a 81 y.o. male who presents today for a work in visit. Seen for Dr. Tamala Julian.   He has a history of CAD s/p CABG in 2003, prior cath in 2012 with patent LIMA to LAD, patent SVG to RCA and 50% ostial stenosis of SVG to LCx and EF being 35% at that time. Also has chronic combined CHF, mild aortic stenosis/mitral regurg by echo in 09/2016, HTN, HLD, pAfib on eliquis, OSA on CPAP, COPD, microvascular dementia, CVA, and falls.   Seen last month for increased DOE, orthopnea and swelling. Lasix was increased for a few days. Got his echo updated - EF is down even more.   Comes in today. Here alone today - daughter on vacation. He still drives. He was under the impression that he was going to have another echo. He says his breathing "has eased" but out of breath "fast" with exertion such as walking into the office.  No chest pain noted. He admits he gets confused "about things". BP soft. Not dizzy. Weight is up a few more pounds but he repeatedly tells me he is feeling better. He tells me he is wearing his CPAP - looks like he has an appointment for Sunday night for his ONO.  Has been evaluated for oxygen here in the office today - he does NOT qualify. His room air resting pulse ox is 90% and with exertion is 93%.   Past Medical History:  Diagnosis Date  . Aortic stenosis   . Arthritis    "thumbs, wrists, legs, spine" (05/12/2017)  . Atrial flutter ([place])   . Benign prostatic hyperplasia (BPH) with urinary urgency   . CAD (coronary artery disease)    a. s/p CABG 2003.  [name] Cerebrovascular disease 2006   multiple ischemic changes on prior MRI  . Chronic  combined systolic and diastolic CHF (congestive heart failure) ([place])   . Chronic lower back pain   . Complication of anesthesia    "woke up too soon once; had 2nd stroke after right knee replacement"  . COPD (chronic obstructive pulmonary disease) ([place])    "no signs or tests"  . CVA (cerebral vascular accident) ([place]) late 1990s; 01/2014  . Dementia   . Depression   . Dry eyes   . Dyslipidemia    well controlled  . Gait disorder   . Heart murmur    "found in ~ 1946"  . Hypertension   . Hypertensive cardiovascular disease   . Male hypogonadism   . Migraine    "major one when I was a kid; very painful"  . Mitral regurgitation   . Multiple falls    "after both knees surgeries; went to rehab; came out and fell all the time; I'm much better now" (05/12/2017)  . Obesity   . OSA on CPAP    cpap  . Paroxysmal atrial fibrillation (HCC)   . Skin cancer of anterior chest   . Spinal stenosis     Past Surgical History:  Procedure Laterality Date  . APPENDECTOMY  as child  . CARDIAC CATHETERIZATION  11/2003   Archie Endo  04/29/2011  . CARDIAC CATHETERIZATION N/A 12/03/2016   Procedure: Left Heart Cath and Cors/Grafts Angiography;  Surgeon: Belva Crome, MD;  Location: [facility];  Service: Cardiovascular;  Laterality: N/A;  . CARDIOVERSION N/A 04/26/2014   Procedure: CARDIOVERSION;  Surgeon: Sinclair Grooms, MD;  Location[facility] -  ENDOSCOPY;  Service: Cardiovascular;  Laterality: N/A;  . CARDIOVERSION N/A 05/24/2014   Procedure: CARDIOVERSION;  Surgeon: Sinclair Grooms, MD;  Location: [facility];  Service: Cardiovascular;  Laterality: N/A;  . CATARACT EXTRACTION W/ INTRAOCULAR LENS  IMPLANT, BILATERAL Bilateral 2000s  . CORONARY ANGIOPLASTY  1991   "no stents" (10/06/2016)  . CORONARY ARTERY BYPASS GRAFT  11/2002   X 3/notes 04/29/2011  . CYST EXCISION     lumbar; Dr. Joya Salm  . EYE SURGERY Right    detached retina  . JOINT REPLACEMENT    . MOHS SURGERY     on chest  . RETINAL  DETACHMENT SURGERY Right   . SHOULDER SURGERY Right    with murphy, reattach ligaments  . TONSILLECTOMY  as child  . TOTAL KNEE ARTHROPLASTY Right 01/02/2014   Procedure: RIGHT TOTAL KNEE ARTHROPLASTY;  Surgeon: Mauri Pole, MD;  Location: [facility];  Service: Orthopedics;  Laterality: Right;  . TOTAL KNEE ARTHROPLASTY Left 01/31/2014   Procedure: LEFT TOTAL KNEE ARTHROPLASTY;  Surgeon: Mauri Pole, MD;  Location: [facility];  Service: Orthopedics;  Laterality: Left;  . TOTAL SHOULDER ARTHROPLASTY Right   . VIDEO ASSISTED THORACOSCOPY (VATS)/THOROCOTOMY  2003   benign nodule     Medications: Current Meds  Medication Sig  . amiodarone (PACERONE) 200 MG tablet Take 1 tablet (200 mg total) by mouth daily.  . Budesonide (RHINOCORT ALLERGY NA) Place 1 spray into the nose daily.  [name] donepezil (ARICEPT) 23 MG TABS tablet TAKE 1 TABLET AT BEDTIME  . DULoxetine (CYMBALTA) 30 MG capsule Take 1 capsule (30 mg total) by mouth daily.  [name] ELIQUIS 5 MG TABS tablet TAKE 1 TABLET TWICE A DAY  . feeding supplement, ENSURE ENLIVE, (ENSURE ENLIVE) LIQD Take 237 mLs by mouth 2 (two) times daily between meals.  . finasteride (PROSCAR) 5 MG tablet Take 1 tablet (5 mg total) by mouth daily.  . folic acid (FOLVITE) 1 MG tablet Take 1 tablet (1 mg total) by mouth daily.  . furosemide (LASIX) 80 MG tablet Take 1 tablet (80 mg total) by mouth 2 (two) times daily.  [name] lisinopril (PRINIVIL,ZESTRIL) 2.5 MG tablet Take 1 tablet (2.5 mg total) by mouth daily.  . Multiple Vitamins-Minerals (MULTIVITAMIN WITH MINERALS) tablet Take 1 tablet by mouth daily. Reported on 01/30/2016  . nitroGLYCERIN (NITROSTAT) 0.4 MG SL tablet Place 1 tablet (0.4 mg total) under the tongue every 5 (five) minutes as needed for chest pain.  . polyethylene glycol (MIRALAX / GLYCOLAX) packet Take 17 g by mouth daily.  . potassium chloride SA (K-DUR,KLOR-CON) 20 MEQ tablet Take 1 tablet (20 mEq total) by mouth 2 (two) times daily.  . rosuvastatin  (CRESTOR) 40 MG tablet Take 1 tablet (40 mg total) by mouth daily. Please keep 06/29/17 appt for future refills.  . sodium chloride (OCEAN) 0.65 % SOLN nasal spray Place 1 spray into both nostrils daily as needed for congestion.   . Testosterone 20.25 MG/1.25GM (1.62%) GEL Apply 2 Squirts topically daily as needed (occasionally). One pump per shoulder.  . traMADol (ULTRAM) 50 MG tablet TAKE 1 TABLET BY MOUTH EVERY 8 HOURS AS NEEDED     Allergies: Allergies  Allergen Reactions  . Procardia [Nifedipine] Rash    All over body    Social History: The patient  reports that he quit smoking about 28 years ago. His smoking use included Cigarettes. He has a 66.00 pack-year smoking history. He has never used smokeless tobacco. He reports that he drinks about 4.8 oz of alcohol per week . He reports that he does not use drugs.   Family History: The patient's family history includes Arthritis in his sister; CAD in his father and mother; Healthy in his sister; Pancreatic cancer in his father.   Review of Systems: Please see the history of present illness.   Otherwise, the review of systems is positive for none.   All other systems are reviewed and negative.   Physical Exam: VS:  BP 98/70 (BP Location: Left Arm, Patient Position: Sitting, Cuff Size: Normal)   Pulse 99   Ht 5\' 8"  (1.727 m)   Wt 192 lb 6.4 oz (87.3 kg)   SpO2 90% Comment: 90 at rest/93 with exertion  BMI 29.25 kg/m  .  BMI Body mass index is 29.25 kg/m.  Wt Readings from Last 3 Encounters:  07/22/17 192 lb 6.4 oz (87.3 kg)  07/06/17 188 lb (85.3 kg)  06/29/17 189 lb 1.9 oz (85.8 kg)    General: Pleasant but does seem a little mixed up. He is alert and in no acute distress. His weight is up 3 pounds on our scales. He actually looks better today to me than at last visit.   HEENT: Normal.  Neck: Supple, no JVD, carotid bruits, or masses noted.  Cardiac: Regular rate and rhythm. Frequent ectopics noted. Harsh outflow murmur.   Trace edema.  Respiratory:  Lungs are clear to auscultation bilaterally with normal work of breathing.  GI: Soft and nontender.  MS: No deformity or atrophy. Gait and ROM intact.  Skin: Warm and dry. Color is normal.  Neuro:  Strength and sensation are intact and no gross focal deficits noted.  Psych: Alert, appropriate and with normal affect.   LABORATORY DATA:  EKG:  EKG is ordered today. He remains in NSR with 1st degree AV block and PACs.   Lab Results  Component Value Date   WBC 6.5 06/23/2017   HGB 14.2 06/23/2017   HCT 42.4 06/23/2017   PLT 167 06/23/2017   GLUCOSE 97 07/06/2017   CHOL 191 01/09/2016   TRIG 251 (H) 01/09/2016   HDL 56 01/09/2016   LDLCALC 85 01/09/2016   ALT 13 05/21/2017   AST 20 05/21/2017   NA 140 07/06/2017   K 5.4 (H) 07/06/2017   CL 100 07/06/2017   CREATININE 1.58 (H) 07/06/2017   BUN 42 (H) 07/06/2017   CO2 22 07/06/2017   TSH 1.397 05/12/2017   PSA 4.91 (H) 03/29/2015   PSA 5.07 (H) 03/29/2015   INR 1.1 11/28/2016   HGBA1C 6.1 (H) 08/15/2014     BNP (last 3 results)  Recent Labs  05/12/17 1938  BNP 1,703.1*    ProBNP (last 3 results)  Recent Labs  03/23/17 1452 06/23/17 0840 06/29/17 1551  PROBNP 4,260* 8,711* 6,585*     Other Studies Reviewed Today:  Echo Study Conclusions 06/2017  - Left ventricle: The cavity size was moderately dilated. Wall   thickness was normal. Systolic function was severely reduced. The   estimated ejection fraction was in the range of 15% to 20%.   Severe diffuse hypokinesis. Doppler parameters are consistent   with restrictive physiology, indicative of decreased  left   ventricular diastolic compliance and/or increased left atrial   pressure. No evidence of thrombus. Acoustic contrast   opacification revealed no evidence ofthrombus. - Aortic valve: Valve mobility was moderately restricted.   Transvalvular velocity was increased less than expected, due to   low cardiac output. There was  moderate stenosis. Valve area   (VTI): 1.13 cm^2. Valve area (Vmax): 1.3 cm^2. Valve area   (Vmean): 1.29 cm^2. - Mitral valve: Calcified annulus. Mildly thickened leaflets .   There was mild to moderate regurgitation directed centrally. - Left atrium: The atrium was moderately to severely dilated. - Right ventricle: The cavity size was mildly dilated. Systolic   function was moderately reduced. - Right atrium: The atrium was mildly dilated. - Pulmonary arteries: Systolic pressure was mildly increased. PA   peak pressure: 35 mm Hg (S).  Impressions:  - Compared to 10/08/2016, left ventricular systolic function has   decreased markedly and aortic gradients are lower. Suspect aortic   stenosis is only moderate, but dobutamine echo may be useful to   evaluate for low gradient severe aortic stenosis.  Cardiac catheterization December 2017:  Left Heart Cath and Cors/Grafts Angiography  Conclusion     Ost Cx lesion, 100 %stenosed.   Severe native vessel coronary disease with total occlusion of the right coronary artery, 99% stenosis of the entire left main and ostial LAD, and total occlusion of the circumflex.  Patent saphenous vein graft to the circumflex/ramus, Patent saphenous vein graft to the PDA, and patent LIMA to the LAD.  No gradient across the aortic valve. Elevated left ventricular end-diastolic pressure 26 mmHg.  Abnormal myocardial perfusion study likely represents myocardial infarction dating to pre-bypass surgery.  RECOMMENDATIONS:   Continue medical therapy  Angio-Seal deployed. If no difficulty with femoral access may be discharged later today.     Coronary Diagrams   Diagnostic Diagram          Assessment/Plan:  1. Acute on chronic combined systolic and diastolic heart failure. Several recent exacerbations - most recent echo with worsening EF - now on low dose ACE - BP soft - would not tolerate further.  He says he is doing better  and he actually looks better today. Repeat lab today. Continue with current regimen.   2. PAF -on amiodarone - holding in sinus by EKG today - his HR was erratic with testing for oxygen.   3. Chronic anticoagulation - recent CBC noted.   4. HTN - BP now soft. Not symptomatic.   5. Moderate AS by recent echo - possible low gradient AS - would defer dobutamine echo to Dr. Tamala Julian - I would favor a more conservative approach given his dementia.    6. Dementia - he appears"mixed up" and notes he is "confused on things" here today.    Current medicines are reviewed with the patient today.  The patient does not have concerns regarding medicines other than what has been noted above.  The following changes have been made:  See above.  Labs/ tests ordered today include:    Orders Placed This Encounter  Procedures  . Basic metabolic panel  . Pro b natriuretic peptide (BNP)  . EKG 12-Lead     Disposition:   FU with Dr. Tamala Julian as planned next week.   Patient is agreeable to this plan and will call if any problems develop in the interim.   SignedTruitt Merle, NP  07/22/2017 11:56 AM  [facility] [address]  [place], [place]  [address] Phone: [phone] Fax: [phone]

## 2017-07-22 NOTE — Telephone Encounter (Signed)
[facility]'s sat levels taken in office today.  Pt does not qualify for O2.

## 2017-07-24 ENCOUNTER — Other Ambulatory Visit: Payer: Self-pay | Admitting: *Deleted

## 2017-07-24 MED ORDER — FUROSEMIDE 80 MG PO TABS: 120.0000 mg | ORAL_TABLET | Freq: Two times a day (BID) | ORAL | 3 refills | Status: DC

## 2017-07-25 ENCOUNTER — Encounter: Payer: Self-pay | Admitting: Nurse Practitioner

## 2017-07-27 ENCOUNTER — Telehealth: Payer: Self-pay | Admitting: Interventional Cardiology

## 2017-07-27 DIAGNOSIS — I5043 Acute on chronic combined systolic (congestive) and diastolic (congestive) heart failure: Secondary | ICD-10-CM | POA: Diagnosis not present

## 2017-07-27 DIAGNOSIS — I110 Hypertensive heart disease with heart failure: Secondary | ICD-10-CM | POA: Diagnosis not present

## 2017-07-27 NOTE — Telephone Encounter (Signed)
I spoke with Armida Sans [facility], [phone]. Doug reports pt's BP 81/50 86/62 HR 60s, pt complains of weakness, dizziness, and feeling tired. Doug states shortness of breath is unchanged, staying well hydrated, denies illness.  I reviewed with Dr Smith--per Dr Tamala Julian:  Hold PM dose of lasix and KCL today. Hold AM dose of lisinopril until BP is checked in the morning.  Doug given these recommendations-states pt checks his BP regularly and [name] will also contact pt's daughter, a nurse, and let her know what is going on and to call with BP before taking lisinopril in the morning.  [name] knows to ask the patient to call the office with his BP reading in the morning before he takes his lisinopril.

## 2017-07-27 NOTE — Telephone Encounter (Signed)
New message     Pt c/o BP issue: STAT if pt c/o blurred vision, one-sided weakness or slurred speech  1. What are your last 5 BP readings?  81/50, 86/62 2. Are you having any other symptoms (ex. Dizziness, headache, blurred vision, passed out)?  Dizzy, very sleepy and tired 3. What is your BP issue? Home health nurse is at the home of the pt and want to hold to speak with someone because pt is symptomatic

## 2017-07-27 NOTE — Telephone Encounter (Signed)
Doug called back and said patient had already taken his PM dose of lasix and KCL. I reviewed with Dr Graciella Belton Dr Tamala Julian:  Hold AM dose of lasix, KCL and lisinopril--check BP in AM and call office with BP reading before taking lasix, KCL, and lisinopril.  [name] will advise pt and pt's daughter of these recommendations.

## 2017-07-28 ENCOUNTER — Telehealth: Payer: Self-pay | Admitting: Interventional Cardiology

## 2017-07-28 DIAGNOSIS — I5043 Acute on chronic combined systolic (congestive) and diastolic (congestive) heart failure: Secondary | ICD-10-CM | POA: Diagnosis not present

## 2017-07-28 DIAGNOSIS — I110 Hypertensive heart disease with heart failure: Secondary | ICD-10-CM | POA: Diagnosis not present

## 2017-07-28 NOTE — Telephone Encounter (Addendum)
Advised [facility] nurse ok for pt to take both as interaction is d/t kidney fx which we are keeping an eye on.  Advised [facility] RN that we currently have these medications on hold as his BP was decreased yesterday and he was having dizziness.  Nurse states she seen pt around Noon today and BP was 102/68, HR 66.  Pt told her he is still feeling much better today then he did yesterday.  [facility] RN appreciative for call back.    Dr. Tamala Julian- please see message from this morning where I spoke with daughter about pt's BP and how he is feeling.

## 2017-07-28 NOTE — Telephone Encounter (Signed)
Daughter states when she got to pt's home he was still in bed.  Checked BP and it was 100/60.  Pt then got up to urinate and weigh, BP dropped to 92/60.  Daughter waited 5 mins and rechecked and it was 89/63.  Pt told daughter that he felt much better and denies any dizziness today.  Advised I would send message to Dr. Tamala Julian for review and advisement.  Pt did hold Lasix, K+ and Lisinopril this AM.

## 2017-07-28 NOTE — Telephone Encounter (Signed)
Jennifer RN([facility]) is calling with a level 3 drug interaction between the Lisinopril and the Lasix . Wants to know how is he supposed to take this medication . Please call

## 2017-07-28 NOTE — Telephone Encounter (Signed)
Resume medications as before starting with this PM's dose of Lasix.

## 2017-07-28 NOTE — Telephone Encounter (Signed)
New message   Pt daughter is calling to report pt BP this morning. His BP was 89/63, he was told yesterday to hold his morning meds because his BP was low. Pt daughter states pt says he feels a lot better today than he did yesterday.

## 2017-07-28 NOTE — Telephone Encounter (Signed)
Spoke with daughter and advised her of recommendations.  Advised her to check pt's BP in the morning prior to medications and contact our office if SBP too low.  Daughter verbalized understanding and was in agreement with this plan.

## 2017-07-29 ENCOUNTER — Telehealth: Payer: Self-pay | Admitting: Interventional Cardiology

## 2017-07-29 DIAGNOSIS — I110 Hypertensive heart disease with heart failure: Secondary | ICD-10-CM | POA: Diagnosis not present

## 2017-07-29 DIAGNOSIS — I5043 Acute on chronic combined systolic (congestive) and diastolic (congestive) heart failure: Secondary | ICD-10-CM | POA: Diagnosis not present

## 2017-07-29 NOTE — Telephone Encounter (Signed)
Spoke with patient's daughter, Sharyn Lull, to find out what medications patient took today. She reports he took: Lisinopril 2.5, Lasix 120 mg and Kdur 20 meq this morning prior to BP measurement by home health PT. She states patient has weight increase of 1.8 lb today. He has an appointment with Dr. Tamala Julian tomorrow afternoon. Daughter asks if she should give patient his Lasix tonight. I advised her to continue plan for Lasix 80 mg this afternoon since Dr. Tamala Julian is not in the office today and may not be able to reply to this message until after afternoon dose is due. I asked her about patient's nutrition and she states he does well with drinking water but he eats a small amount of food throughout the day rather than eating meals. I advised her that if hypotension continues, she may give the patient some chicken broth or something with a little more sodium than he is used to eating to raise BP slightly. I advised her to call back with additional questions or concerns prior to f/u appointment and she thanked me for the call.

## 2017-07-29 NOTE — Telephone Encounter (Signed)
Patient's duaghter (DPR) was instructed to give our office a call with patient's BP and weight.  BP 104/80, HR 70, and wt. 184.8 lbs. Patient started taking all his medications again and is feeling fine at the time. Patient has appointment with Dr. Tamala Julian tomorrow. Will forward to Dr. Tamala Julian and his nurse, so they are aware.

## 2017-07-29 NOTE — Telephone Encounter (Signed)
New message    Aaron Berg PT from [facility] is calling to update about his BP.  Pt c/o BP issue: STAT if pt c/o blurred vision, one-sided weakness or slurred speech  1. What are your last 5 BP readings? 92/68  2. Are you having any other symptoms (ex. Dizziness, headache, blurred vision, passed out)? Dizzy, tired  3. What is your BP issue? Pt has been having problems with his BP this week. [name] wants to report about him today. He said with his medications held  Yesterday pt felt better. Pt took meds today and is having symptoms.

## 2017-07-29 NOTE — Telephone Encounter (Signed)
New message  Pt daughter states she was requested to call us with bp and weight readings   Pt c/o BP issue: STAT if pt c/o blurred vision, one-sided weakness or slurred speech  1. What are your last 5 BP readings? 104/80 pulse 70, weight 184.8  2. Are you having any other symptoms (ex. Dizziness, headache, blurred vision, passed out)? no  3. What is your BP issue? Just giving [place] a heads up - pt states he felt good and did not take or need naps

## 2017-07-29 NOTE — Telephone Encounter (Signed)
Follow up   Pt is returning call    

## 2017-07-29 NOTE — Telephone Encounter (Signed)
Will see tomorrow. Agree with Lasix 80 mg today.

## 2017-07-30 ENCOUNTER — Ambulatory Visit: Payer: Medicare Other | Admitting: Interventional Cardiology

## 2017-07-30 ENCOUNTER — Telehealth: Payer: Self-pay

## 2017-07-30 ENCOUNTER — Telehealth: Payer: Self-pay | Admitting: Interventional Cardiology

## 2017-07-30 DIAGNOSIS — I350 Nonrheumatic aortic (valve) stenosis: Secondary | ICD-10-CM | POA: Insufficient documentation

## 2017-07-30 NOTE — Progress Notes (Deleted)
Cardiology Office Note    Date:  07/30/2017   ID:  MD SMOLA, DOB June 12, 1936, MRN [id number]  PCP:  [name], MD  Cardiologist: Sinclair Grooms, MD   No chief complaint on file.   History of Present Illness:  Aaron Berg is a 81 y.o. male has a history of CAD s/p CABG in 2003, prior cath in 2012 with patent LIMA to LAD, patent SVG to RCA and 50% ostial stenosis of SVG to LCx and EF being 35% at that time. Also has chronic combined CHF, mild aortic stenosis/mitral regurg by echo in 09/2016, HTN, HLD, pAfib on eliquis, OSA on CPAP, COPD, microvascular dementia, CVA, and falls.     Past Medical History:  Diagnosis Date  . Aortic stenosis   . Arthritis    "thumbs, wrists, legs, spine" (05/12/2017)  . Atrial flutter ([place])   . Benign prostatic hyperplasia (BPH) with urinary urgency   . CAD (coronary artery disease)    a. s/p CABG 2003.  [name] Cerebrovascular disease 2006   multiple ischemic changes on prior MRI  . Chronic combined systolic and diastolic CHF (congestive heart failure) ([place])   . Chronic lower back pain   . Complication of anesthesia    "woke up too soon once; had 2nd stroke after right knee replacement"  . COPD (chronic obstructive pulmonary disease) ([place])    "no signs or tests"  . CVA (cerebral vascular accident) ([place]) late 1990s; 01/2014  . Dementia   . Depression   . Dry eyes   . Dyslipidemia    well controlled  . Gait disorder   . Heart murmur    "found in ~ 1946"  . Hypertension   . Hypertensive cardiovascular disease   . Male hypogonadism   . Migraine    "major one when I was a kid; very painful"  . Mitral regurgitation   . Multiple falls    "after both knees surgeries; went to rehab; came out and fell all the time; I'm much better now" (05/12/2017)  . Obesity   . OSA on CPAP    cpap  . Paroxysmal atrial fibrillation (HCC)   . Skin cancer of anterior chest   . Spinal stenosis     Past Surgical History:  Procedure Laterality Date   . APPENDECTOMY  as child  . CARDIAC CATHETERIZATION  11/2003   Archie Endo 04/29/2011  . CARDIAC CATHETERIZATION N/A 12/03/2016   Procedure: Left Heart Cath and Cors/Grafts Angiography;  Surgeon: Belva Crome, MD;  Location: [facility];  Service: Cardiovascular;  Laterality: N/A;  . CARDIOVERSION N/A 04/26/2014   Procedure: CARDIOVERSION;  Surgeon: Sinclair Grooms, MD;  [facility];  Service: Cardiovascular;  Laterality: N/A;  . CARDIOVERSION N/A 05/24/2014   Procedure: CARDIOVERSION;  Surgeon: Sinclair Grooms, MD;  Location: [facility];  Service: Cardiovascular;  Laterality: N/A;  . CATARACT EXTRACTION W/ INTRAOCULAR LENS  IMPLANT, BILATERAL Bilateral 2000s  . CORONARY ANGIOPLASTY  1991   "no stents" (10/06/2016)  . CORONARY ARTERY BYPASS GRAFT  11/2002   X 3/notes 04/29/2011  . CYST EXCISION     lumbar; Dr. Joya Salm  . EYE SURGERY Right    detached retina  . JOINT REPLACEMENT    . MOHS SURGERY     on chest  . RETINAL DETACHMENT SURGERY Right   . SHOULDER SURGERY Right    with murphy, reattach ligaments  . TONSILLECTOMY  as child  . TOTAL KNEE ARTHROPLASTY  Right 01/02/2014   Procedure: RIGHT TOTAL KNEE ARTHROPLASTY;  Surgeon: Mauri Pole, MD;  Location: [facility];  Service: Orthopedics;  Laterality: Right;  . TOTAL KNEE ARTHROPLASTY Left 01/31/2014   Procedure: LEFT TOTAL KNEE ARTHROPLASTY;  Surgeon: Mauri Pole, MD;  Location: [facility];  Service: Orthopedics;  Laterality: Left;  . TOTAL SHOULDER ARTHROPLASTY Right   . VIDEO ASSISTED THORACOSCOPY (VATS)/THOROCOTOMY  2003   benign nodule    Current Medications: Outpatient Medications Prior to Visit  Medication Sig Dispense Refill  . amiodarone (PACERONE) 200 MG tablet Take 1 tablet (200 mg total) by mouth daily. 30 tablet 5  . Budesonide (RHINOCORT ALLERGY NA) Place 1 spray into the nose daily.    [name] donepezil (ARICEPT) 23 MG TABS tablet TAKE 1 TABLET AT BEDTIME 90 tablet 1  . DULoxetine (CYMBALTA) 30 MG capsule  Take 1 capsule (30 mg total) by mouth daily. 30 capsule 3  . ELIQUIS 5 MG TABS tablet TAKE 1 TABLET TWICE A DAY 180 tablet 3  . feeding supplement, ENSURE ENLIVE, (ENSURE ENLIVE) LIQD Take 237 mLs by mouth 2 (two) times daily between meals. 237 mL 12  . finasteride (PROSCAR) 5 MG tablet Take 1 tablet (5 mg total) by mouth daily. 90 tablet 3  . folic acid (FOLVITE) 1 MG tablet Take 1 tablet (1 mg total) by mouth daily. 30 tablet 1  . furosemide (LASIX) 80 MG tablet Take 1.5 tablets (120 mg total) by mouth 2 (two) times daily. (120 mg ) am and (80 mg) pm. 90 tablet 3  . lisinopril (PRINIVIL,ZESTRIL) 2.5 MG tablet Take 1 tablet (2.5 mg total) by mouth daily. 90 tablet 3  . Multiple Vitamins-Minerals (MULTIVITAMIN WITH MINERALS) tablet Take 1 tablet by mouth daily. Reported on 01/30/2016    . nitroGLYCERIN (NITROSTAT) 0.4 MG SL tablet Place 1 tablet (0.4 mg total) under the tongue every 5 (five) minutes as needed for chest pain. 25 tablet 12  . polyethylene glycol (MIRALAX / GLYCOLAX) packet Take 17 g by mouth daily.    . potassium chloride SA (K-DUR,KLOR-CON) 20 MEQ tablet Take 1 tablet (20 mEq total) by mouth 2 (two) times daily. 180 tablet 3  . rosuvastatin (CRESTOR) 40 MG tablet Take 1 tablet (40 mg total) by mouth daily. Please keep 06/29/17 appt for future refills. 90 tablet 3  . sodium chloride (OCEAN) 0.65 % SOLN nasal spray Place 1 spray into both nostrils daily as needed for congestion.     . Testosterone 20.25 MG/1.25GM (1.62%) GEL Apply 2 Squirts topically daily as needed (occasionally). One pump per shoulder.    . traMADol (ULTRAM) 50 MG tablet TAKE 1 TABLET BY MOUTH EVERY 8 HOURS AS NEEDED 50 tablet 1   No facility-administered medications prior to visit.      Allergies:   Procardia [nifedipine]   Social History   Social History  . Marital status: Divorced    Spouse name: N/A  . Number of children: 2  . Years of education: Masters   Occupational History  .      retired    Social History Main Topics  . Smoking status: Former Smoker    Packs/day: 2.00    Years: 33.00    Types: Cigarettes    Quit date: 06/17/1989  . Smokeless tobacco: Never Used  . Alcohol use 4.8 oz/week    8 Shots of liquor per week     Comment: 05/12/2017 "bourbon" (when he has it--ran out 05/2017)  . Drug use: No  .  Sexual activity: Not Currently   Other Topics Concern  . Not on file   Social History Narrative   Patient is single and lives alone.   Patient is retired.   Patient has a Scientist, water quality.   Patient has two adult children.   Patient is right-handed.   Patient drinks one cup of coffee daily.      Lives in [place] community   Retired Engineer, civil (consulting)     Family History:  The patient's ***family history includes Arthritis in his sister; CAD in his father and mother; Healthy in his sister; Pancreatic cancer in his father.   ROS:   Please see the history of present illness.    ***  All other systems reviewed and are negative.   PHYSICAL EXAM:   VS:  There were no vitals taken for this visit.   GEN: Well nourished, well developed, in no acute distress  HEENT: normal  Neck: no JVD, carotid bruits, or masses Cardiac: ***RRR; no murmurs, rubs, or gallops,no edema  Respiratory:  clear to auscultation bilaterally, normal work of breathing GI: soft, nontender, nondistended, + BS MS: no deformity or atrophy  Skin: warm and dry, no rash Neuro:  Alert and Oriented x 3, Strength and sensation are intact Psych: euthymic mood, full affect  Wt Readings from Last 3 Encounters:  07/22/17 192 lb 6.4 oz (87.3 kg)  07/06/17 188 lb (85.3 kg)  06/29/17 189 lb 1.9 oz (85.8 kg)      Studies/Labs Reviewed:   EKG:  EKG  ***  Recent Labs: 05/12/2017: B Natriuretic Peptide 1,703.1; TSH 1.397 05/14/2017: Magnesium 1.9 05/21/2017: ALT 13 06/23/2017: Hemoglobin 14.2; Platelets 167 07/22/2017: BUN 39; Creatinine, Ser 1.41; NT-Pro BNP 6,747; Potassium 4.7; Sodium 141   Lipid  Panel    Component Value Date/Time   CHOL 191 01/09/2016 0001   TRIG 251 (H) 01/09/2016 0001   HDL 56 01/09/2016 0001   CHOLHDL 3.4 01/09/2016 0001   VLDL 50 (H) 01/09/2016 0001   LDLCALC 85 01/09/2016 0001    Additional studies/ records that were reviewed today include:  ***    ASSESSMENT:    1. Paroxysmal atrial fibrillation (HCC)   2. Coronary artery disease involving coronary bypass graft of native heart with angina pectoris ([place])   3. Chronic combined systolic and diastolic CHF (congestive heart failure) ([place])   4. OSA (obstructive sleep apnea)   5. Obesity (BMI 30-39.9)   6. Nonrheumatic aortic valve stenosis      PLAN:  In order of problems listed above:  1. ***    Medication Adjustments/Labs and Tests Ordered: Current medicines are reviewed at length with the patient today.  Concerns regarding medicines are outlined above.  Medication changes, Labs and Tests ordered today are listed in the Patient Instructions below. There are no Patient Instructions on file for this visit.   Signed, Sinclair Grooms, MD  07/30/2017 8:23 AM    [facility] [place], [place], [place]  [address] Phone: [phone]; Fax: [phone]

## 2017-07-30 NOTE — Telephone Encounter (Signed)
Pt took regular meds yesterday AM and PM.  Pt then accidentally took AM meds at around 9Pm last night.  Advised daughter to have pt not repeat AM meds at this time.  We are seeing pt at [facility] to have pt hold off on PM meds until we see him today.  Daughter appreciative for help.

## 2017-07-30 NOTE — Telephone Encounter (Signed)
Daughter, Edmonia Lynch called asking for [place] to contact Harlow Asa at [phone] at [facility]. Patient accidentally took 2 doses of all of his medications last night and had a "bad" night without sleep. Patient is scheduled to see cardiologist today and was pt's daughter was advised by cardiologist for pt NOT to take any BP or heart medications this morning. Edmonia Lynch states pt is fine now, but just asking for [place] to reach out to [facility] for medication administration assistance to prevent this in the future. [name]

## 2017-07-30 NOTE — Telephone Encounter (Signed)
New message    Pt daughter is calling stating pt took his morning meds last night he thinks. Should he go ahead and take morning meds this morning? They have appt this afternoon

## 2017-08-01 NOTE — Telephone Encounter (Signed)
Take care of this 

## 2017-08-02 ENCOUNTER — Other Ambulatory Visit: Payer: Self-pay | Admitting: Family Medicine

## 2017-08-02 DIAGNOSIS — F329 Major depressive disorder, single episode, unspecified: Secondary | ICD-10-CM

## 2017-08-02 NOTE — Progress Notes (Addendum)
Cardiology Office Note    Date:  08/04/2017   ID:  Aaron Berg, [place] Oct 24, 1936, MRN [id number]  PCP:  [name], MD  Cardiologist: Sinclair Grooms, MD   Chief Complaint  Patient presents with  . Coronary Artery Disease  . Cardiac Valve Problem    AS    History of Present Illness:  JAMOL GINYARD is a 81 y.o. male  a history of CAD s/p CABG in 2003, prior cath in 2012 with patent LIMA to LAD, patent SVG to RCA and 50% ostial stenosis of SVG to LCx and EF being 35% at that time. Also has chronic combined CHF, mild aortic stenosis/mitral regurg by echo in 09/2016, HTN, HLD, pAfib on eliquis, OSA on CPAP, COPD, microvascular dementia, CVA, and falls.   Missed the last office appointment due to poor time management and forgetting the actual time of the appointment. Shortness of breath is improved with significant increase in diuretic regimen. Now he has relatively low blood pressure with systolic recordings in the 40-814 mmHg systolic range. He denies orthopnea. He has not had angina. Lisinopril is been discontinued because of hypotension. Despite discontinuing the medication, relatively low pressures persist.   Past Medical History:  Diagnosis Date  . Aortic stenosis   . Arthritis    "thumbs, wrists, legs, spine" (05/12/2017)  . Atrial flutter ([place])   . Benign prostatic hyperplasia (BPH) with urinary urgency   . CAD (coronary artery disease)    a. s/p CABG 2003.  [name] Cerebrovascular disease 2006   multiple ischemic changes on prior MRI  . Chronic combined systolic and diastolic CHF (congestive heart failure) ([place])   . Chronic lower back pain   . Complication of anesthesia    "woke up too soon once; had 2nd stroke after right knee replacement"  . COPD (chronic obstructive pulmonary disease) ([place])    "no signs or tests"  . CVA (cerebral vascular accident) ([place]) late 1990s; 01/2014  . Dementia   . Depression   . Dry eyes   . Dyslipidemia    well controlled  . Gait  disorder   . Heart murmur    "found in ~ 1946"  . Hypertension   . Hypertensive cardiovascular disease   . Male hypogonadism   . Migraine    "major one when I was a kid; very painful"  . Mitral regurgitation   . Multiple falls    "after both knees surgeries; went to rehab; came out and fell all the time; I'm much better now" (05/12/2017)  . Obesity   . OSA on CPAP    cpap  . Paroxysmal atrial fibrillation (HCC)   . Skin cancer of anterior chest   . Spinal stenosis     Past Surgical History:  Procedure Laterality Date  . APPENDECTOMY  as child  . CARDIAC CATHETERIZATION  11/2003   Archie Endo 04/29/2011  . CARDIAC CATHETERIZATION N/A 12/03/2016   Procedure: Left Heart Cath and Cors/Grafts Angiography;  Surgeon: Belva Crome, MD;  Location: [facility];  Service: Cardiovascular;  Laterality: N/A;  . CARDIOVERSION N/A 04/26/2014   Procedure: CARDIOVERSION;  Surgeon: Sinclair Grooms, MD;  Location: [facility] ENDOSCOPY;  Service: Cardiovascular;  Laterality: N/A;  . CARDIOVERSION N/A 05/24/2014   Procedure: CARDIOVERSION;  Surgeon: Sinclair Grooms, MD;  Location: [facility];  Service: Cardiovascular;  Laterality: N/A;  . CATARACT EXTRACTION W/ INTRAOCULAR LENS  IMPLANT, BILATERAL Bilateral 2000s  . CORONARY ANGIOPLASTY  1991   "  no stents" (10/06/2016)  . CORONARY ARTERY BYPASS GRAFT  11/2002   X 3/notes 04/29/2011  . CYST EXCISION     lumbar; Dr. Joya Salm  . EYE SURGERY Right    detached retina  . JOINT REPLACEMENT    . MOHS SURGERY     on chest  . RETINAL DETACHMENT SURGERY Right   . SHOULDER SURGERY Right    with murphy, reattach ligaments  . TONSILLECTOMY  as child  . TOTAL KNEE ARTHROPLASTY Right 01/02/2014   Procedure: RIGHT TOTAL KNEE ARTHROPLASTY;  Surgeon: Mauri Pole, MD;  [facility];  Service: Orthopedics;  Laterality: Right;  . TOTAL KNEE ARTHROPLASTY Left 01/31/2014   Procedure: LEFT TOTAL KNEE ARTHROPLASTY;  Surgeon: Mauri Pole, MD;  [facility];   Service: Orthopedics;  Laterality: Left;  . TOTAL SHOULDER ARTHROPLASTY Right   . VIDEO ASSISTED THORACOSCOPY (VATS)/THOROCOTOMY  2003   benign nodule    Current Medications: Outpatient Medications Prior to Visit  Medication Sig Dispense Refill  . amiodarone (PACERONE) 200 MG tablet Take 1 tablet (200 mg total) by mouth daily. 30 tablet 5  . Budesonide (RHINOCORT ALLERGY NA) Place 1 spray into the nose daily.    [name] donepezil (ARICEPT) 23 MG TABS tablet TAKE 1 TABLET AT BEDTIME 90 tablet 1  . DULoxetine (CYMBALTA) 30 MG capsule TAKE 1 CAPSULE BY MOUTH EVERY DAY 30 capsule 3  . ELIQUIS 5 MG TABS tablet TAKE 1 TABLET TWICE A DAY 180 tablet 3  . feeding supplement, ENSURE ENLIVE, (ENSURE ENLIVE) LIQD Take 237 mLs by mouth 2 (two) times daily between meals. 237 mL 12  . finasteride (PROSCAR) 5 MG tablet Take 1 tablet (5 mg total) by mouth daily. 90 tablet 3  . folic acid (FOLVITE) 1 MG tablet Take 1 tablet (1 mg total) by mouth daily. 30 tablet 1  . furosemide (LASIX) 80 MG tablet Take 1.5 tablets (120 mg total) by mouth 2 (two) times daily. (120 mg ) am and (80 mg) pm. 90 tablet 3  . Multiple Vitamins-Minerals (MULTIVITAMIN WITH MINERALS) tablet Take 1 tablet by mouth daily. Reported on 01/30/2016    . nitroGLYCERIN (NITROSTAT) 0.4 MG SL tablet Place 1 tablet (0.4 mg total) under the tongue every 5 (five) minutes as needed for chest pain. 25 tablet 12  . polyethylene glycol (MIRALAX / GLYCOLAX) packet Take 17 g by mouth daily.    . potassium chloride SA (K-DUR,KLOR-CON) 20 MEQ tablet Take 1 tablet (20 mEq total) by mouth 2 (two) times daily. 180 tablet 3  . rosuvastatin (CRESTOR) 40 MG tablet Take 1 tablet (40 mg total) by mouth daily. Please keep 06/29/17 appt for future refills. 90 tablet 3  . sodium chloride (OCEAN) 0.65 % SOLN nasal spray Place 1 spray into both nostrils daily as needed for congestion.     . Testosterone 20.25 MG/1.25GM (1.62%) GEL Apply 2 Squirts topically daily as needed  (occasionally). One pump per shoulder.    . traMADol (ULTRAM) 50 MG tablet TAKE 1 TABLET BY MOUTH EVERY 8 HOURS AS NEEDED 50 tablet 1  . lisinopril (PRINIVIL,ZESTRIL) 2.5 MG tablet Take 1 tablet (2.5 mg total) by mouth daily. (Patient not taking: Reported on 08/04/2017) 90 tablet 3   No facility-administered medications prior to visit.      Allergies:   Procardia [nifedipine]   Social History   Social History  . Marital status: Divorced    Spouse name: N/A  . Number of children: 2  . Years of  education: Masters   Occupational History  .      retired   Social History Main Topics  . Smoking status: Former Smoker    Packs/day: 2.00    Years: 33.00    Types: Cigarettes    Quit date: 06/17/1989  . Smokeless tobacco: Never Used  . Alcohol use 4.8 oz/week    8 Shots of liquor per week     Comment: 05/12/2017 "bourbon" (when he has it--ran out 05/2017)  . Drug use: No  . Sexual activity: Not Currently   Other Topics Concern  . None   Social History Narrative   Patient is single and lives alone.   Patient is retired.   Patient has a Scientist, water quality.   Patient has two adult children.   Patient is right-handed.   Patient drinks one cup of coffee daily.      Lives in [place] community   Retired Engineer, civil (consulting)     Family History:  The patient's family history includes Arthritis in his sister; CAD in his father and mother; Healthy in his sister; Pancreatic cancer in his father.   ROS:   Please see the history of present illness.    Decreased appetite. Weight loss. Decreased memory. Difficulty ambulating due to arthritis in his knee.  All other systems reviewed and are negative.   PHYSICAL EXAM:   VS:  BP 90/68 (BP Location: Left Arm)   Pulse 72   Ht 5\' 8"  (1.727 m)   Wt 183 lb 12.8 oz (83.4 kg)   BMI 27.95 kg/m    GEN: Appears frail., well developed, in no acute distress  HEENT: normal  Neck: no JVD, carotid bruits, or masses Cardiac: RRR; but he does have a  coarse 3/6 crescendo decrescendo systolic murmur of aortic stenosis. No rubs, or gallops,no edema  Respiratory:  clear to auscultation bilaterally, normal work of breathing GI: soft, nontender, nondistended, + BS MS: no deformity or atrophy  Skin: warm and dry, no rash Neuro:  Alert and Oriented x 3, Strength and sensation are intact Psych: euthymic mood, full affect  Wt Readings from Last 3 Encounters:  08/04/17 183 lb 12.8 oz (83.4 kg)  07/22/17 192 lb 6.4 oz (87.3 kg)  07/06/17 188 lb (85.3 kg)      Studies/Labs Reviewed:   EKG:  EKG  Not repeated  Recent Labs: 05/12/2017: B Natriuretic Peptide 1,703.1; TSH 1.397 05/14/2017: Magnesium 1.9 05/21/2017: ALT 13 06/23/2017: Hemoglobin 14.2; Platelets 167 07/22/2017: BUN 39; Creatinine, Ser 1.41; NT-Pro BNP 6,747; Potassium 4.7; Sodium 141   Lipid Panel    Component Value Date/Time   CHOL 191 01/09/2016 0001   TRIG 251 (H) 01/09/2016 0001   HDL 56 01/09/2016 0001   CHOLHDL 3.4 01/09/2016 0001   VLDL 50 (H) 01/09/2016 0001   LDLCALC 85 01/09/2016 0001    Additional studies/ records that were reviewed today include:  Echocardiogram 7/23//2018: ------------------------------------------------------------------- Study Conclusions  - Left ventricle: The cavity size was moderately dilated. Wall   thickness was normal. Systolic function was severely reduced. The   estimated ejection fraction was in the range of 15% to 20%.   Severe diffuse hypokinesis. Doppler parameters are consistent   with restrictive physiology, indicative of decreased left   ventricular diastolic compliance and/or increased left atrial   pressure. No evidence of thrombus. Acoustic contrast   opacification revealed no evidence ofthrombus. - Aortic valve: Valve mobility was moderately restricted.   Transvalvular velocity was increased less than expected, due to  low cardiac output. There was moderate stenosis. Valve area   (VTI): 1.13 cm^2. Valve area (Vmax):  1.3 cm^2. Valve area   (Vmean): 1.29 cm^2. - Mitral valve: Calcified annulus. Mildly thickened leaflets .   There was mild to moderate regurgitation directed centrally. - Left atrium: The atrium was moderately to severely dilated. - Right ventricle: The cavity size was mildly dilated. Systolic   function was moderately reduced. - Right atrium: The atrium was mildly dilated. - Pulmonary arteries: Systolic pressure was mildly increased. PA   peak pressure: 35 mm Hg (S).  Impressions:  - Compared to 10/08/2016, left ventricular systolic function has   decreased markedly and aortic gradients are lower. Suspect aortic   stenosis is only moderate, but dobutamine echo may be useful to   evaluate for low gradient severe aortic stenosis.  Cardiac catheterization December 2017: Coronary Diagrams   Diagnostic Diagram           ASSESSMENT:    1. Chronic combined systolic and diastolic CHF (congestive heart failure) ([place])   2. Essential hypertension, benign   3. Paroxysmal atrial fibrillation (HCC)   4. Coronary artery disease involving coronary bypass graft of native heart with angina pectoris ([place])   5. Nonrheumatic aortic valve stenosis   6. OSA (obstructive sleep apnea)      PLAN:  In order of problems listed above:  1. Continue current dose of diuretic intensity which is furosemide 1:20 AM and 80 mg p.m. The medicines obtained today. Continue to hold lisinopril. 2. Relatively low blood pressure. May be over diuresed. Basic metabolic panel will help [place] sort this out. 3. No clear evidence of atrial fibrillation today. 4. Stable without angina. 5. Rule out critical aortic stenosis with the relatively low pressure gradient. Dobutamine echo is ordered.  We will get blood work today to help determine if we are over diuresing the patient. In the meantime we will have dobutamine echocardiogram. Clinical follow-up in one month with repeat blood work.  Medication Adjustments/Labs  and Tests Ordered: Current medicines are reviewed at length with the patient today.  Concerns regarding medicines are outlined above.  Medication changes, Labs and Tests ordered today are listed in the Patient Instructions below. There are no Patient Instructions on file for this visit.   Signed, Sinclair Grooms, MD  08/04/2017 3:40 PM    [facility] [place], [place], [place]  [address] Phone: [phone]; Fax: [phone]

## 2017-08-03 DIAGNOSIS — I110 Hypertensive heart disease with heart failure: Secondary | ICD-10-CM | POA: Diagnosis not present

## 2017-08-03 DIAGNOSIS — I5043 Acute on chronic combined systolic (congestive) and diastolic (congestive) heart failure: Secondary | ICD-10-CM | POA: Diagnosis not present

## 2017-08-03 NOTE — Telephone Encounter (Signed)
Called and l/m for Aaron Berg to call us back

## 2017-08-03 NOTE — Telephone Encounter (Signed)
Can he have a refill on this. 

## 2017-08-04 ENCOUNTER — Ambulatory Visit (INDEPENDENT_AMBULATORY_CARE_PROVIDER_SITE_OTHER): Payer: Medicare Other | Admitting: Interventional Cardiology

## 2017-08-04 ENCOUNTER — Encounter: Payer: Self-pay | Admitting: Interventional Cardiology

## 2017-08-04 VITALS — BP 90/68 | HR 72 | Ht 68.0 in | Wt 183.8 lb

## 2017-08-04 DIAGNOSIS — G4733 Obstructive sleep apnea (adult) (pediatric): Secondary | ICD-10-CM

## 2017-08-04 DIAGNOSIS — I350 Nonrheumatic aortic (valve) stenosis: Secondary | ICD-10-CM | POA: Diagnosis not present

## 2017-08-04 DIAGNOSIS — I10 Essential (primary) hypertension: Secondary | ICD-10-CM | POA: Diagnosis not present

## 2017-08-04 DIAGNOSIS — I209 Angina pectoris, unspecified: Secondary | ICD-10-CM

## 2017-08-04 DIAGNOSIS — I480 Paroxysmal atrial fibrillation: Secondary | ICD-10-CM | POA: Diagnosis not present

## 2017-08-04 DIAGNOSIS — I2510 Atherosclerotic heart disease of native coronary artery without angina pectoris: Secondary | ICD-10-CM | POA: Diagnosis not present

## 2017-08-04 DIAGNOSIS — I5042 Chronic combined systolic (congestive) and diastolic (congestive) heart failure: Secondary | ICD-10-CM | POA: Diagnosis not present

## 2017-08-04 DIAGNOSIS — I25709 Atherosclerosis of coronary artery bypass graft(s), unspecified, with unspecified angina pectoris: Secondary | ICD-10-CM | POA: Diagnosis not present

## 2017-08-04 NOTE — Addendum Note (Signed)
Addended by: Loren Racer on: 08/04/2017 04:01 PM   Modules accepted: Orders

## 2017-08-04 NOTE — Patient Instructions (Addendum)
Medication Instructions:  1) Stay off of the Lisinopril  Labwork: BMET and BNP today  Testing/Procedures: Your physician has requested that you have a dobutamine echocardiogram. For further information please visit [email or web address]. Please follow instruction sheet as given.   Follow-Up: Your physician recommends that you schedule a follow-up appointment in: 1 month with Dr. Tamala Julian. (Can have 9/20 at 10:40A)   Any Other Special Instructions Will Be Listed Below (If Applicable).  Make sure to weigh daily under the same circumstances.    If you need a refill on your cardiac medications before your next appointment, please call your pharmacy.

## 2017-08-04 NOTE — Telephone Encounter (Signed)
Called and spoke with rebecca about this, per the pt's daughter . She would like for rebecca to set up  Medicine assistance for a pt. She will contact mr.Kraeger daughter about this, faxed over his med list to rebecca [phone]

## 2017-08-05 ENCOUNTER — Telehealth: Payer: Self-pay | Admitting: *Deleted

## 2017-08-05 ENCOUNTER — Other Ambulatory Visit: Payer: Self-pay | Admitting: *Deleted

## 2017-08-05 ENCOUNTER — Other Ambulatory Visit: Payer: Self-pay | Admitting: Interventional Cardiology

## 2017-08-05 DIAGNOSIS — I110 Hypertensive heart disease with heart failure: Secondary | ICD-10-CM | POA: Diagnosis not present

## 2017-08-05 DIAGNOSIS — I5043 Acute on chronic combined systolic (congestive) and diastolic (congestive) heart failure: Secondary | ICD-10-CM | POA: Diagnosis not present

## 2017-08-05 LAB — BASIC METABOLIC PANEL
BUN / CREAT RATIO: 31 — AB (ref 10–24)
BUN: 46 mg/dL — AB (ref 8–27)
CHLORIDE: 95 mmol/L — AB (ref 96–106)
CO2: 26 mmol/L (ref 20–29)
Calcium: 10 mg/dL (ref 8.6–10.2)
Creatinine, Ser: 1.49 mg/dL — ABNORMAL HIGH (ref 0.76–1.27)
GFR calc non Af Amer: 44 mL/min/{1.73_m2} — ABNORMAL LOW (ref >59)
GFR, EST AFRICAN AMERICAN: 51 mL/min/{1.73_m2} — AB (ref >59)
GLUCOSE: 98 mg/dL (ref 65–99)
POTASSIUM: 4.2 mmol/L (ref 3.5–5.2)
Sodium: 141 mmol/L (ref 134–144)

## 2017-08-05 LAB — PRO B NATRIURETIC PEPTIDE: NT-PRO BNP: 5754 pg/mL — AB (ref 0–486)

## 2017-08-05 MED ORDER — FUROSEMIDE 80 MG PO TABS: ORAL_TABLET | ORAL | 3 refills | Status: DC

## 2017-08-05 NOTE — Telephone Encounter (Signed)
New order In Please notify when done. Please send results to [phone] -Nina/Henry Tamala Julian

## 2017-08-05 NOTE — Telephone Encounter (Signed)
-----   Message from Belva Crome, MD sent at 08/05/2017  8:30 AM EDT ----- Let the patient know the labs are stable. Continue diuretic regimen as directed with 120 mg a.m., and 80 mg p.m. Please let me know if this is different than what they are doing. A copy will be sent to [name], MD

## 2017-08-05 NOTE — Telephone Encounter (Signed)
-----   Message from Loren Racer, LPN sent at 07/02/5973  4:01 PM EDT ----- Stark Bray,  Just ordered an ONO while on CPAP for this pt.  We did one back in July and per pt he did not wear his CPAP during the test.  Thanks Anderson Malta

## 2017-08-05 NOTE — Telephone Encounter (Signed)
Spoke with daughter and went over results and recommendations per Dr. Tamala Julian.  Pt is currently taking Furosemide 120mg  AM and 80mg  PM.  Daughter verbalized understanding and was in agreement with this plan.

## 2017-08-06 ENCOUNTER — Telehealth: Payer: Self-pay | Admitting: Family Medicine

## 2017-08-06 ENCOUNTER — Telehealth: Payer: Self-pay | Admitting: *Deleted

## 2017-08-06 DIAGNOSIS — I5043 Acute on chronic combined systolic (congestive) and diastolic (congestive) heart failure: Secondary | ICD-10-CM | POA: Diagnosis not present

## 2017-08-06 DIAGNOSIS — I110 Hypertensive heart disease with heart failure: Secondary | ICD-10-CM | POA: Diagnosis not present

## 2017-08-06 NOTE — Telephone Encounter (Signed)
No change and no further testing.

## 2017-08-06 NOTE — Telephone Encounter (Signed)
Per [facility],  if patient needs nighttime oxygen he will have to have an in lab titration on CPAP.  If the patient only needs daytime oxygen he only needs an in office walk test documenting oxygen desating.  Per Medicare, ONO will not qualify the patient for daytime oxygen.

## 2017-08-06 NOTE — Telephone Encounter (Signed)
Aaron Berg with [facility] called. She states that at home care for this pt will end next week. Her recommendation in that pt continue with care 1 time a week for a few weeks. Please advise.

## 2017-08-06 NOTE — Telephone Encounter (Signed)
-----   Message from [place] sent at 08/05/2017 10:42 AM EDT ----- Regarding: RE: New order Please call me on this one when you have a chance. [place] [phone].  Thanks! ----- Message ----- From: Freada Bergeron, CMA Sent: 08/05/2017  10:12 AM To: Jiles Crocker, Darlina Guys Subject: New order                                      Please send This needs to be while wearing CPAP.  Per pt he did not wear CPAP during the first test.  Please notify when done. Please fax results to [phone] - Nina/Henry smith  Thanks, Gae Bon

## 2017-08-06 NOTE — Telephone Encounter (Signed)
Pt seen by Truitt Merle, NP 8/8 and did walking test and did not qualify for daytime O2.   Dr. Tamala Julian- would you like for pt to have lab titration on CPAP?  Do you want to cancel plan for repeat ONO?

## 2017-08-06 NOTE — Telephone Encounter (Signed)
ok 

## 2017-08-07 ENCOUNTER — Other Ambulatory Visit: Payer: Self-pay | Admitting: Interventional Cardiology

## 2017-08-07 ENCOUNTER — Telehealth: Payer: Self-pay

## 2017-08-07 MED ORDER — KLOR-CON M20 20 MEQ PO TBCR
20.0000 meq | EXTENDED_RELEASE_TABLET | Freq: Every day | ORAL | 11 refills | Status: DC
Start: 2017-08-07 — End: 2017-08-07

## 2017-08-07 MED ORDER — POTASSIUM CHLORIDE CRYS ER 20 MEQ PO TBCR
20.0000 meq | EXTENDED_RELEASE_TABLET | Freq: Two times a day (BID) | ORAL | 3 refills | Status: DC
Start: 2017-08-07 — End: 2017-08-07

## 2017-08-07 MED ORDER — KLOR-CON M20 20 MEQ PO TBCR: 20.0000 meq | EXTENDED_RELEASE_TABLET | Freq: Two times a day (BID) | ORAL | 3 refills | Status: DC

## 2017-08-07 NOTE — Telephone Encounter (Signed)
Note    No change and no further testing.    Loren Racer, LPN    8/87/19 5:97 PM  Note    Pt seen by Truitt Merle, NP 8/8 and did walking test and did not qualify for daytime O2.   Dr. Tamala Julian- would you like for pt to have lab titration on CPAP?  Do you want to cancel plan for repeat ONO?

## 2017-08-07 NOTE — Telephone Encounter (Signed)
Pt's pharmacy stated that potassium chloride was not available and in order for pt to get his medication, I needed to resent in Klor-Con 20 meq, in order for pt's medication to be refilled. Rx sent in, confirmation received.

## 2017-08-07 NOTE — Telephone Encounter (Signed)
Spoke with pt's daughter- she would like [place] to retract the orders given to [facility] for medication management. She is not satisfied with how [facility] is handling this. [name]

## 2017-08-07 NOTE — Telephone Encounter (Signed)
Anderson Malta from [facility] t# [phone] and gave her authorization per Dr. Redmond School

## 2017-08-08 NOTE — Telephone Encounter (Signed)
Take care of this 

## 2017-08-10 NOTE — Telephone Encounter (Signed)
I have called [facility] [phone] left message for her to please call me back

## 2017-08-11 DIAGNOSIS — I110 Hypertensive heart disease with heart failure: Secondary | ICD-10-CM | POA: Diagnosis not present

## 2017-08-11 DIAGNOSIS — I5043 Acute on chronic combined systolic (congestive) and diastolic (congestive) heart failure: Secondary | ICD-10-CM | POA: Diagnosis not present

## 2017-08-13 DIAGNOSIS — I110 Hypertensive heart disease with heart failure: Secondary | ICD-10-CM | POA: Diagnosis not present

## 2017-08-13 DIAGNOSIS — I5043 Acute on chronic combined systolic (congestive) and diastolic (congestive) heart failure: Secondary | ICD-10-CM | POA: Diagnosis not present

## 2017-08-17 DIAGNOSIS — I5042 Chronic combined systolic (congestive) and diastolic (congestive) heart failure: Secondary | ICD-10-CM | POA: Diagnosis not present

## 2017-08-17 DIAGNOSIS — I110 Hypertensive heart disease with heart failure: Secondary | ICD-10-CM | POA: Diagnosis not present

## 2017-08-18 ENCOUNTER — Telehealth: Payer: Self-pay | Admitting: Family Medicine

## 2017-08-18 NOTE — Telephone Encounter (Signed)
Sharyn Lull will fax forms to [place]. Advised her we will fill out. [name]

## 2017-08-18 NOTE — Telephone Encounter (Signed)
Go ahead and fill out the FMLA form exactly as a previous one and I will sign

## 2017-08-18 NOTE — Telephone Encounter (Signed)
Pt's daughter, Sharyn Lull, says her job is requiring an updated FMLA so she can care for the pt and take him to appointments. She said the FMLA will just need to be worded the exact same with the date range changed. Sharyn Lull wants to know if Dr Redmond School can complete the FMLA without her having to bring the pt in for an appointment. She would like to not bring him in if possible. Call [place] at [phone]

## 2017-08-20 ENCOUNTER — Telehealth (HOSPITAL_COMMUNITY): Payer: Self-pay | Admitting: *Deleted

## 2017-08-20 DIAGNOSIS — I110 Hypertensive heart disease with heart failure: Secondary | ICD-10-CM | POA: Diagnosis not present

## 2017-08-20 DIAGNOSIS — I5042 Chronic combined systolic (congestive) and diastolic (congestive) heart failure: Secondary | ICD-10-CM | POA: Diagnosis not present

## 2017-08-20 NOTE — Telephone Encounter (Signed)
Left message on voicemail per DPR in reference to upcoming appointment scheduled on 08/27/17 at 2:30 with detailed instructions given per Stress Test Requisition Sheet for the test. LM to arrive 30 minutes early, and that it is imperative to arrive on time for appointment to keep from having the test rescheduled. If you need to cancel or reschedule your appointment, please call the office within 24 hours of your appointment. Failure to do so may result in a cancellation of your appointment, and a $50 no show fee. Phone number given for call back for any questions. Aaron Berg

## 2017-08-21 ENCOUNTER — Ambulatory Visit: Payer: Medicare Other | Admitting: Cardiology

## 2017-08-24 DIAGNOSIS — I5042 Chronic combined systolic (congestive) and diastolic (congestive) heart failure: Secondary | ICD-10-CM | POA: Diagnosis not present

## 2017-08-24 DIAGNOSIS — I110 Hypertensive heart disease with heart failure: Secondary | ICD-10-CM | POA: Diagnosis not present

## 2017-08-26 DIAGNOSIS — I5042 Chronic combined systolic (congestive) and diastolic (congestive) heart failure: Secondary | ICD-10-CM | POA: Diagnosis not present

## 2017-08-26 DIAGNOSIS — I110 Hypertensive heart disease with heart failure: Secondary | ICD-10-CM | POA: Diagnosis not present

## 2017-08-27 ENCOUNTER — Ambulatory Visit (HOSPITAL_BASED_OUTPATIENT_CLINIC_OR_DEPARTMENT_OTHER): Payer: Medicare Other

## 2017-08-27 ENCOUNTER — Other Ambulatory Visit: Payer: Self-pay

## 2017-08-27 ENCOUNTER — Ambulatory Visit (HOSPITAL_COMMUNITY): Payer: Medicare Other | Attending: Cardiovascular Disease

## 2017-08-27 DIAGNOSIS — Z8249 Family history of ischemic heart disease and other diseases of the circulatory system: Secondary | ICD-10-CM | POA: Diagnosis not present

## 2017-08-27 DIAGNOSIS — G4733 Obstructive sleep apnea (adult) (pediatric): Secondary | ICD-10-CM | POA: Diagnosis not present

## 2017-08-27 DIAGNOSIS — I350 Nonrheumatic aortic (valve) stenosis: Secondary | ICD-10-CM | POA: Insufficient documentation

## 2017-08-27 DIAGNOSIS — I509 Heart failure, unspecified: Secondary | ICD-10-CM | POA: Insufficient documentation

## 2017-08-27 DIAGNOSIS — J449 Chronic obstructive pulmonary disease, unspecified: Secondary | ICD-10-CM | POA: Insufficient documentation

## 2017-08-27 DIAGNOSIS — Z87891 Personal history of nicotine dependence: Secondary | ICD-10-CM | POA: Insufficient documentation

## 2017-08-27 DIAGNOSIS — I4892 Unspecified atrial flutter: Secondary | ICD-10-CM | POA: Insufficient documentation

## 2017-08-27 DIAGNOSIS — I340 Nonrheumatic mitral (valve) insufficiency: Secondary | ICD-10-CM | POA: Diagnosis not present

## 2017-08-27 DIAGNOSIS — I110 Hypertensive heart disease with heart failure: Secondary | ICD-10-CM | POA: Diagnosis not present

## 2017-08-27 DIAGNOSIS — I440 Atrioventricular block, first degree: Secondary | ICD-10-CM | POA: Diagnosis not present

## 2017-08-27 DIAGNOSIS — E785 Hyperlipidemia, unspecified: Secondary | ICD-10-CM | POA: Insufficient documentation

## 2017-08-27 DIAGNOSIS — I2510 Atherosclerotic heart disease of native coronary artery without angina pectoris: Secondary | ICD-10-CM | POA: Insufficient documentation

## 2017-08-27 DIAGNOSIS — I4891 Unspecified atrial fibrillation: Secondary | ICD-10-CM | POA: Insufficient documentation

## 2017-08-27 DIAGNOSIS — I219 Acute myocardial infarction, unspecified: Secondary | ICD-10-CM | POA: Insufficient documentation

## 2017-08-27 MED ORDER — SODIUM CHLORIDE 0.9 % IV SOLN
20.0000 ug/kg/min | Freq: Once | INTRAVENOUS | Status: AC
  Administered 2017-08-27: 1664 ug/min via INTRAVENOUS

## 2017-09-01 DIAGNOSIS — I110 Hypertensive heart disease with heart failure: Secondary | ICD-10-CM | POA: Diagnosis not present

## 2017-09-01 DIAGNOSIS — I5042 Chronic combined systolic (congestive) and diastolic (congestive) heart failure: Secondary | ICD-10-CM | POA: Diagnosis not present

## 2017-09-02 ENCOUNTER — Other Ambulatory Visit: Payer: Self-pay | Admitting: Family Medicine

## 2017-09-02 NOTE — Telephone Encounter (Signed)
Called in tramadol .  

## 2017-09-03 ENCOUNTER — Other Ambulatory Visit: Payer: Self-pay

## 2017-09-03 ENCOUNTER — Other Ambulatory Visit: Payer: Self-pay | Admitting: Interventional Cardiology

## 2017-09-03 ENCOUNTER — Ambulatory Visit (INDEPENDENT_AMBULATORY_CARE_PROVIDER_SITE_OTHER): Payer: Medicare Other | Admitting: Interventional Cardiology

## 2017-09-03 ENCOUNTER — Observation Stay (HOSPITAL_COMMUNITY)
Admission: EM | Admit: 2017-09-03 | Discharge: 2017-09-04 | Disposition: A | Discharge status: Home / Self Care | Payer: Medicare Other | Attending: Internal Medicine | Admitting: Internal Medicine

## 2017-09-03 ENCOUNTER — Encounter (HOSPITAL_COMMUNITY): Payer: Self-pay | Admitting: Emergency Medicine

## 2017-09-03 ENCOUNTER — Encounter: Payer: Self-pay | Admitting: Interventional Cardiology

## 2017-09-03 VITALS — BP 96/70 | HR 125 | Ht 68.0 in | Wt 176.4 lb

## 2017-09-03 DIAGNOSIS — I5023 Acute on chronic systolic (congestive) heart failure: Secondary | ICD-10-CM

## 2017-09-03 DIAGNOSIS — I480 Paroxysmal atrial fibrillation: Secondary | ICD-10-CM | POA: Insufficient documentation

## 2017-09-03 DIAGNOSIS — Z96611 Presence of right artificial shoulder joint: Secondary | ICD-10-CM | POA: Insufficient documentation

## 2017-09-03 DIAGNOSIS — M19041 Primary osteoarthritis, right hand: Secondary | ICD-10-CM | POA: Insufficient documentation

## 2017-09-03 DIAGNOSIS — F0390 Unspecified dementia without behavioral disturbance: Secondary | ICD-10-CM | POA: Diagnosis not present

## 2017-09-03 DIAGNOSIS — I5042 Chronic combined systolic (congestive) and diastolic (congestive) heart failure: Secondary | ICD-10-CM

## 2017-09-03 DIAGNOSIS — E785 Hyperlipidemia, unspecified: Secondary | ICD-10-CM

## 2017-09-03 DIAGNOSIS — Z7901 Long term (current) use of anticoagulants: Secondary | ICD-10-CM

## 2017-09-03 DIAGNOSIS — I130 Hypertensive heart and chronic kidney disease with heart failure and stage 1 through stage 4 chronic kidney disease, or unspecified chronic kidney disease: Secondary | ICD-10-CM | POA: Diagnosis not present

## 2017-09-03 DIAGNOSIS — N400 Enlarged prostate without lower urinary tract symptoms: Secondary | ICD-10-CM | POA: Diagnosis not present

## 2017-09-03 DIAGNOSIS — I481 Persistent atrial fibrillation: Secondary | ICD-10-CM | POA: Diagnosis not present

## 2017-09-03 DIAGNOSIS — I25709 Atherosclerosis of coronary artery bypass graft(s), unspecified, with unspecified angina pectoris: Secondary | ICD-10-CM | POA: Diagnosis not present

## 2017-09-03 DIAGNOSIS — Z8249 Family history of ischemic heart disease and other diseases of the circulatory system: Secondary | ICD-10-CM | POA: Insufficient documentation

## 2017-09-03 DIAGNOSIS — E669 Obesity, unspecified: Secondary | ICD-10-CM | POA: Diagnosis not present

## 2017-09-03 DIAGNOSIS — I472 Ventricular tachycardia: Secondary | ICD-10-CM | POA: Diagnosis not present

## 2017-09-03 DIAGNOSIS — G4733 Obstructive sleep apnea (adult) (pediatric): Secondary | ICD-10-CM | POA: Diagnosis not present

## 2017-09-03 DIAGNOSIS — Z800 Family history of malignant neoplasm of digestive organs: Secondary | ICD-10-CM | POA: Insufficient documentation

## 2017-09-03 DIAGNOSIS — M19042 Primary osteoarthritis, left hand: Secondary | ICD-10-CM | POA: Insufficient documentation

## 2017-09-03 DIAGNOSIS — I6523 Occlusion and stenosis of bilateral carotid arteries: Secondary | ICD-10-CM | POA: Insufficient documentation

## 2017-09-03 DIAGNOSIS — M19031 Primary osteoarthritis, right wrist: Secondary | ICD-10-CM | POA: Insufficient documentation

## 2017-09-03 DIAGNOSIS — M19032 Primary osteoarthritis, left wrist: Secondary | ICD-10-CM | POA: Diagnosis not present

## 2017-09-03 DIAGNOSIS — I081 Rheumatic disorders of both mitral and tricuspid valves: Secondary | ICD-10-CM | POA: Diagnosis not present

## 2017-09-03 DIAGNOSIS — R000 Tachycardia, unspecified: Secondary | ICD-10-CM

## 2017-09-03 DIAGNOSIS — M479 Spondylosis, unspecified: Secondary | ICD-10-CM | POA: Diagnosis not present

## 2017-09-03 DIAGNOSIS — I700 Atherosclerosis of aorta: Secondary | ICD-10-CM | POA: Diagnosis not present

## 2017-09-03 DIAGNOSIS — Z87891 Personal history of nicotine dependence: Secondary | ICD-10-CM | POA: Insufficient documentation

## 2017-09-03 DIAGNOSIS — Z8673 Personal history of transient ischemic attack (TIA), and cerebral infarction without residual deficits: Secondary | ICD-10-CM | POA: Insufficient documentation

## 2017-09-03 DIAGNOSIS — Z9842 Cataract extraction status, left eye: Secondary | ICD-10-CM | POA: Insufficient documentation

## 2017-09-03 DIAGNOSIS — J449 Chronic obstructive pulmonary disease, unspecified: Secondary | ICD-10-CM | POA: Diagnosis not present

## 2017-09-03 DIAGNOSIS — I10 Essential (primary) hypertension: Secondary | ICD-10-CM

## 2017-09-03 DIAGNOSIS — I5043 Acute on chronic combined systolic (congestive) and diastolic (congestive) heart failure: Secondary | ICD-10-CM | POA: Diagnosis not present

## 2017-09-03 DIAGNOSIS — Z79899 Other long term (current) drug therapy: Secondary | ICD-10-CM | POA: Diagnosis not present

## 2017-09-03 DIAGNOSIS — Z8261 Family history of arthritis: Secondary | ICD-10-CM | POA: Insufficient documentation

## 2017-09-03 DIAGNOSIS — I484 Atypical atrial flutter: Secondary | ICD-10-CM | POA: Diagnosis not present

## 2017-09-03 DIAGNOSIS — M4800 Spinal stenosis, site unspecified: Secondary | ICD-10-CM | POA: Insufficient documentation

## 2017-09-03 DIAGNOSIS — Z96653 Presence of artificial knee joint, bilateral: Secondary | ICD-10-CM | POA: Insufficient documentation

## 2017-09-03 DIAGNOSIS — R269 Unspecified abnormalities of gait and mobility: Secondary | ICD-10-CM | POA: Diagnosis not present

## 2017-09-03 DIAGNOSIS — I350 Nonrheumatic aortic (valve) stenosis: Secondary | ICD-10-CM

## 2017-09-03 DIAGNOSIS — Z6826 Body mass index (BMI) 26.0-26.9, adult: Secondary | ICD-10-CM | POA: Insufficient documentation

## 2017-09-03 DIAGNOSIS — Z9841 Cataract extraction status, right eye: Secondary | ICD-10-CM | POA: Insufficient documentation

## 2017-09-03 DIAGNOSIS — N184 Chronic kidney disease, stage 4 (severe): Secondary | ICD-10-CM | POA: Diagnosis not present

## 2017-09-03 DIAGNOSIS — Z888 Allergy status to other drugs, medicaments and biological substances status: Secondary | ICD-10-CM | POA: Insufficient documentation

## 2017-09-03 DIAGNOSIS — Z85828 Personal history of other malignant neoplasm of skin: Secondary | ICD-10-CM | POA: Insufficient documentation

## 2017-09-03 LAB — COMPREHENSIVE METABOLIC PANEL
ALBUMIN: 3.6 g/dL (ref 3.5–5.0)
ALK PHOS: 51 U/L (ref 38–126)
ALT: 17 U/L (ref 17–63)
AST: 28 U/L (ref 15–41)
Anion gap: 14 (ref 5–15)
BILIRUBIN TOTAL: 0.9 mg/dL (ref 0.3–1.2)
BUN: 50 mg/dL — AB (ref 6–20)
CO2: 25 mmol/L (ref 22–32)
Calcium: 9.9 mg/dL (ref 8.9–10.3)
Chloride: 96 mmol/L — ABNORMAL LOW (ref 101–111)
Creatinine, Ser: 1.84 mg/dL — ABNORMAL HIGH (ref 0.61–1.24)
GFR calc Af Amer: 38 mL/min — ABNORMAL LOW (ref >60)
GFR calc non Af Amer: 33 mL/min — ABNORMAL LOW (ref >60)
GLUCOSE: 76 mg/dL (ref 65–99)
POTASSIUM: 3.7 mmol/L (ref 3.5–5.1)
Sodium: 135 mmol/L (ref 135–145)
TOTAL PROTEIN: 6.7 g/dL (ref 6.5–8.1)

## 2017-09-03 LAB — I-STAT TROPONIN, ED: Troponin i, poc: 0.04 ng/mL (ref 0.00–0.08)

## 2017-09-03 LAB — CBC WITH DIFFERENTIAL/PLATELET
BASOS ABS: 0 10*3/uL (ref 0.0–0.1)
BASOS PCT: 0 %
Eosinophils Absolute: 0.1 10*3/uL (ref 0.0–0.7)
Eosinophils Relative: 2 %
HEMATOCRIT: 42 % (ref 39.0–52.0)
HEMOGLOBIN: 13.9 g/dL (ref 13.0–17.0)
Lymphocytes Relative: 15 %
Lymphs Abs: 1 10*3/uL (ref 0.7–4.0)
MCH: 30.3 pg (ref 26.0–34.0)
MCHC: 33.1 g/dL (ref 30.0–36.0)
MCV: 91.5 fL (ref 78.0–100.0)
Monocytes Absolute: 0.6 10*3/uL (ref 0.1–1.0)
Monocytes Relative: 9 %
NEUTROS ABS: 5.2 10*3/uL (ref 1.7–7.7)
NEUTROS PCT: 74 %
Platelets: 146 10*3/uL — ABNORMAL LOW (ref 150–400)
RBC: 4.59 MIL/uL (ref 4.22–5.81)
RDW: 14.6 % (ref 11.5–15.5)
WBC: 6.9 10*3/uL (ref 4.0–10.5)

## 2017-09-03 LAB — TSH: TSH: 2.319 u[IU]/mL (ref 0.350–4.500)

## 2017-09-03 LAB — MAGNESIUM: Magnesium: 2.5 mg/dL — ABNORMAL HIGH (ref 1.7–2.4)

## 2017-09-03 LAB — PROTIME-INR
INR: 1.49
Prothrombin Time: 17.9 seconds — ABNORMAL HIGH (ref 11.4–15.2)

## 2017-09-03 LAB — BRAIN NATRIURETIC PEPTIDE: B NATRIURETIC PEPTIDE 5: 567.6 pg/mL — AB (ref 0.0–100.0)

## 2017-09-03 MED ORDER — SODIUM CHLORIDE 0.9% FLUSH
3.0000 mL | Freq: Two times a day (BID) | INTRAVENOUS | Status: DC
  Administered 2017-09-03 – 2017-09-04 (×2): 3 mL via INTRAVENOUS

## 2017-09-03 MED ORDER — APIXABAN 5 MG PO TABS
5.0000 mg | ORAL_TABLET | Freq: Two times a day (BID) | ORAL | Status: DC
Start: 2017-09-03 — End: 2017-09-03

## 2017-09-03 MED ORDER — SODIUM CHLORIDE 0.9% FLUSH: 3.0000 mL | INTRAVENOUS | Status: DC | PRN

## 2017-09-03 MED ORDER — FUROSEMIDE 80 MG PO TABS: 80.0000 mg | ORAL_TABLET | Freq: Every day | ORAL | Status: DC

## 2017-09-03 MED ORDER — APIXABAN 5 MG PO TABS
5.0000 mg | ORAL_TABLET | Freq: Two times a day (BID) | ORAL | Status: DC
  Administered 2017-09-03 – 2017-09-04 (×2): 5 mg via ORAL
  Filled 2017-09-03 (×3): qty 1

## 2017-09-03 MED ORDER — AMIODARONE HCL 200 MG PO TABS: 200.0000 mg | ORAL_TABLET | Freq: Every day | ORAL | Status: DC

## 2017-09-03 MED ORDER — ONDANSETRON HCL 4 MG/2ML IJ SOLN: 4.0000 mg | Freq: Four times a day (QID) | INTRAMUSCULAR | Status: DC | PRN

## 2017-09-03 MED ORDER — FUROSEMIDE 80 MG PO TABS
80.0000 mg | ORAL_TABLET | Freq: Every day | ORAL | Status: DC
  Administered 2017-09-03: 80 mg via ORAL
  Filled 2017-09-03: qty 1

## 2017-09-03 MED ORDER — SODIUM CHLORIDE 0.9 % IV SOLN: 250.0000 mL | INTRAVENOUS | Status: DC | PRN

## 2017-09-03 MED ORDER — DULOXETINE HCL 30 MG PO CPEP
30.0000 mg | ORAL_CAPSULE | Freq: Every day | ORAL | Status: DC
Start: 2017-09-03 — End: 2017-09-03

## 2017-09-03 MED ORDER — ACETAMINOPHEN 325 MG PO TABS
650.0000 mg | ORAL_TABLET | ORAL | Status: DC | PRN
  Administered 2017-09-03: 650 mg via ORAL
  Filled 2017-09-03: qty 2

## 2017-09-03 MED ORDER — ROSUVASTATIN CALCIUM 20 MG PO TABS
40.0000 mg | ORAL_TABLET | Freq: Every day | ORAL | Status: DC
  Administered 2017-09-03: 40 mg via ORAL
  Filled 2017-09-03: qty 1

## 2017-09-03 MED ORDER — DULOXETINE HCL 30 MG PO CPEP
30.0000 mg | ORAL_CAPSULE | Freq: Every day | ORAL | Status: DC
  Administered 2017-09-03: 30 mg via ORAL
  Filled 2017-09-03 (×2): qty 1

## 2017-09-03 MED ORDER — POTASSIUM CHLORIDE 20 MEQ PO PACK
20.0000 meq | PACK | Freq: Two times a day (BID) | ORAL | Status: DC
  Administered 2017-09-04: 20 meq via ORAL
  Filled 2017-09-03 (×2): qty 1

## 2017-09-03 MED ORDER — FUROSEMIDE 80 MG PO TABS: 120.0000 mg | ORAL_TABLET | Freq: Every morning | ORAL | Status: DC

## 2017-09-03 MED ORDER — SODIUM CHLORIDE 0.9 % IV BOLUS (SEPSIS)
250.0000 mL | Freq: Once | INTRAVENOUS | Status: AC
  Administered 2017-09-03: 250 mL via INTRAVENOUS

## 2017-09-03 MED ORDER — DONEPEZIL HCL 10 MG PO TABS
10.0000 mg | ORAL_TABLET | Freq: Every day | ORAL | Status: DC
  Administered 2017-09-03: 10 mg via ORAL
  Filled 2017-09-03 (×3): qty 1

## 2017-09-03 MED ORDER — FINASTERIDE 5 MG PO TABS
5.0000 mg | ORAL_TABLET | Freq: Every day | ORAL | Status: DC
Start: 2017-09-04 — End: 2017-09-04
  Administered 2017-09-04: 5 mg via ORAL
  Filled 2017-09-03: qty 1

## 2017-09-03 MED ORDER — FINASTERIDE 5 MG PO TABS
5.0000 mg | ORAL_TABLET | Freq: Every day | ORAL | Status: DC
Start: 2017-09-03 — End: 2017-09-03

## 2017-09-03 NOTE — ED Notes (Signed)
Pt's Daughter: Sharyn Lull   [phone]

## 2017-09-03 NOTE — Consult Note (Signed)
Advanced Heart Failure Team Consult Note   Primary Physician: Dr Redmond School Primary Cardiologist:  Dr Tamala Julian  Reason for Consultation: Heart Failure   HPI:    Aaron Berg is seen today for evaluation of heart failure at the request of Dr Tamala Julian.   Aaron Berg is a 81 year old with a history of CAD S/P CABG 2003, chronic combined systolic/diastoic heart failure, HTN, hyperlipidemia, OSA on CPAP, dementia, CVA, and falls.   Earlier today he was evaluated by Dr Tamala Julian for routine follow up. He was feeling ok with no complaints.Weight at home has been trending down from 188>170 pounds. In the office he was tachycardic with EKG showing wide complex tachycardia  ? SVT. Of note in August, lisinopril was stopped due to hypotension and has not been on bb due to soft bp.He was sent to the ED from the office for admit and evaluation. Denies CP/sob. Pertinent admission labs include creatinine 1.8, K 3.7, Mag 2.5, Hgb 13.9, and troponin 0.04.   Stress ECHO 08/2017 - EF 20%, Moderate to severe RV dysfunction Moderate Aortic Stenosis.   LHC 11/2016   Severe native vessel coronary disease with total occlusion of the right coronary artery, 99% stenosis of the entire left main and ostial LAD, and total occlusion of the circumflex.  Patent saphenous vein graft to the circumflex/ramus, Patent saphenous vein graft to the PDA, and patent LIMA to the LAD.  No gradient across the aortic valve. Elevated left ventricular end-diastolic pressure 26 mmHg. .  Nuclear Stress Test 11/2016   EF 41%   There was no ST segment deviation noted during stress.   There is a medium defect of moderate severity present in the basal inferior, mid inferior, apical lateral and apex location. The defect is non-reversible and consistent with prior scar of diaphragmatic attenuation artifact.   This is a high risk study.   The left ventricular ejection fraction is moderately decreased (30-44%).   Review of Systems: [y] = yes, [  ] = no   General: Weight gain [ ] ; Weight loss [Y ]; Anorexia [ ] ; Fatigue [Y ]; Fever [ ] ; Chills [ ] ; Weakness Blue.Reese ]  Cardiac: Chest pain/pressure [ ] ; Resting SOB [ ] ; Exertional SOB [ ] ; Orthopnea [ ] ; Pedal Edema [ y]; Palpitations [ ] ; Syncope [ ] ; Presyncope [ ] ; Paroxysmal nocturnal dyspnea[ ]   Pulmonary: Cough [ ] ; Wheezing[ ] ; Hemoptysis[ ] ; Sputum [ ] ; Snoring [ ]   GI: Vomiting[ ] ; Dysphagia[ ] ; Melena[ ] ; Hematochezia [ ] ; Heartburn[ ] ; Abdominal pain [ ] ; Constipation [ ] ; Diarrhea [ ] ; BRBPR [ ]   GU: Hematuria[ ] ; Dysuria [ ] ; Nocturia[ ]   Vascular: Pain in legs with walking [ ] ; Pain in feet with lying flat [ ] ; Non-healing sores [ ] ; Stroke [ ] ; TIA [ ] ; Slurred speech [ ] ;  Neuro: Headaches[ ] ; Vertigo[ ] ; Seizures[ ] ; Paresthesias[ ] ;Blurred vision [ ] ; Diplopia [ ] ; Vision changes [ ]   Ortho/Skin: Arthritis [ y]; Joint pain [ Y]; Muscle pain [ ] ; Joint swelling [ ] ; Back Pain [ Y]; Rash [ ]   Psych: Depression[ ] ; Anxiety[ ]   Heme: Bleeding problems [ ] ; Clotting disorders [ ] ; Anemia [ ]   Endocrine: Diabetes [ ] ; Thyroid dysfunction[ ]   Home Medications Prior to Admission medications   Medication Sig Start Date End Date Taking? Authorizing Provider  amiodarone (PACERONE) 200 MG tablet Take 1 tablet (200 mg total) by mouth daily. 05/16/17   Daune Perch, NP  Budesonide (  RHINOCORT ALLERGY NA) Place 1 spray into the nose daily.    [provider]  donepezil (ARICEPT) 23 MG TABS tablet TAKE 1 TABLET AT BEDTIME 03/17/17   Dohmeier, [name], MD  DULoxetine (CYMBALTA) 30 MG capsule TAKE 1 CAPSULE BY MOUTH EVERY DAY 08/03/17   [name], MD  ELIQUIS 5 MG TABS tablet TAKE 1 TABLET TWICE A DAY 06/19/17   Belva Crome, MD  feeding supplement, ENSURE ENLIVE, (ENSURE ENLIVE) LIQD Take 237 mLs by mouth 2 (two) times daily between meals. 10/08/16   Reyne Dumas, MD  finasteride (PROSCAR) 5 MG tablet Take 1 tablet (5 mg total) by mouth daily. 05/21/17   [name], MD  folic  acid (FOLVITE) 1 MG tablet Take 1 tablet (1 mg total) by mouth daily. 10/09/16   Reyne Dumas, MD  furosemide (LASIX) 80 MG tablet Take 1.5 tablets (120mg ) by mouth in the morning.  Take 1 tablet (80mg ) by mouth in the evening. 08/05/17   Belva Crome, MD  KLOR-CON M20 20 MEQ tablet Take 1 tablet (20 mEq total) by mouth 2 (two) times daily. 08/07/17   Belva Crome, MD  Multiple Vitamins-Minerals (MULTIVITAMIN WITH MINERALS) tablet Take 1 tablet by mouth daily. Reported on 01/30/2016    [provider]  nitroGLYCERIN (NITROSTAT) 0.4 MG SL tablet Place 1 tablet (0.4 mg total) under the tongue every 5 (five) minutes as needed for chest pain. 10/07/16   Bhagat, Crista Luria, PA  polyethylene glycol (MIRALAX / GLYCOLAX) packet Take 17 g by mouth daily.    [provider]  rosuvastatin (CRESTOR) 40 MG tablet Take 1 tablet (40 mg total) by mouth daily. Please keep 06/29/17 appt for future refills. 06/18/17   Burtis Junes, NP  sodium chloride (OCEAN) 0.65 % SOLN nasal spray Place 1 spray into both nostrils daily as needed for congestion.     [provider]  Testosterone 20.25 MG/1.25GM (1.62%) GEL Apply 2 Squirts topically daily as needed (occasionally). One pump per shoulder. 03/06/14   Reed, Tiffany L, DO  traMADol (ULTRAM) 50 MG tablet TAKE 1 TABLET BY MOUTH EVERY 8 HOURS AS NEEDED 09/02/17   [name], MD    Past Medical History: Past Medical History:  Diagnosis Date  . Aortic stenosis   . Arthritis    "thumbs, wrists, legs, spine" (05/12/2017)  . Atrial flutter ([place])   . Benign prostatic hyperplasia (BPH) with urinary urgency   . CAD (coronary artery disease)    a. s/p CABG 2003.  [name] Cerebrovascular disease 2006   multiple ischemic changes on prior MRI  . Chronic combined systolic and diastolic CHF (congestive heart failure) ([place])   . Chronic lower back pain   . Complication of anesthesia    "woke up too soon once; had 2nd stroke after right knee replacement"    . COPD (chronic obstructive pulmonary disease) ([place])    "no signs or tests"  . CVA (cerebral vascular accident) ([place]) late 1990s; 01/2014  . Dementia   . Depression   . Dry eyes   . Dyslipidemia    well controlled  . Gait disorder   . Heart murmur    "found in ~ 1946"  . Hypertension   . Hypertensive cardiovascular disease   . Male hypogonadism   . Migraine    "major one when I was a kid; very painful"  . Mitral regurgitation   . Multiple falls    "after both knees surgeries; went to rehab; came  out and fell all the time; I'm much better now" (05/12/2017)  . Obesity   . OSA on CPAP    cpap  . Paroxysmal atrial fibrillation (HCC)   . Skin cancer of anterior chest   . Spinal stenosis     Past Surgical History: Past Surgical History:  Procedure Laterality Date  . APPENDECTOMY  as child  . CARDIAC CATHETERIZATION  11/2003   Archie Endo 04/29/2011  . CARDIAC CATHETERIZATION N/A 12/03/2016   Procedure: Left Heart Cath and Cors/Grafts Angiography;  Surgeon: Belva Crome, MD;  Location: [place] [facility];  Service: Cardiovascular;  Laterality: N/A;  . CARDIOVERSION N/A 04/26/2014   Procedure: CARDIOVERSION;  Surgeon: Sinclair Grooms, MD;  Location: [facility] ENDOSCOPY;  Service: Cardiovascular;  Laterality: N/A;  . CARDIOVERSION N/A 05/24/2014   Procedure: CARDIOVERSION;  Surgeon: Sinclair Grooms, MD;  Location: [place];  Service: Cardiovascular;  Laterality: N/A;  . CATARACT EXTRACTION W/ INTRAOCULAR LENS  IMPLANT, BILATERAL Bilateral 2000s  . CORONARY ANGIOPLASTY  1991   "no stents" (10/06/2016)  . CORONARY ARTERY BYPASS GRAFT  11/2002   X 3/notes 04/29/2011  . CYST EXCISION     lumbar; Dr. Joya Salm  . EYE SURGERY Right    detached retina  . JOINT REPLACEMENT    . MOHS SURGERY     on chest  . RETINAL DETACHMENT SURGERY Right   . SHOULDER SURGERY Right    with murphy, reattach ligaments  . TONSILLECTOMY  as child  . TOTAL KNEE ARTHROPLASTY Right 01/02/2014   Procedure: RIGHT  TOTAL KNEE ARTHROPLASTY;  Surgeon: Mauri Pole, MD;  Location: [facility] ORS;  Service: Orthopedics;  Laterality: Right;  . TOTAL KNEE ARTHROPLASTY Left 01/31/2014   Procedure: LEFT TOTAL KNEE ARTHROPLASTY;  Surgeon: Mauri Pole, MD;  Location: [facility];  Service: Orthopedics;  Laterality: Left;  . TOTAL SHOULDER ARTHROPLASTY Right   . VIDEO ASSISTED THORACOSCOPY (VATS)/THOROCOTOMY  2003   benign nodule    Family History: Family History  Problem Relation Age of Onset  . Pancreatic cancer Father   . CAD Father   . CAD Mother   . Arthritis Sister   . Healthy Sister     Social History: Social History   Social History  . Marital status: Divorced    Spouse name: N/A  . Number of children: 2  . Years of education: Masters   Occupational History  .      retired   Social History Main Topics  . Smoking status: Former Smoker    Packs/day: 2.00    Years: 33.00    Types: Cigarettes    Quit date: 06/17/1989  . Smokeless tobacco: Never Used  . Alcohol use 4.8 oz/week    8 Shots of liquor per week     Comment: 05/12/2017 "bourbon" (when he has it--ran out 05/2017)  . Drug use: No  . Sexual activity: Not Currently   Other Topics Concern  . None   Social History Narrative   Patient is single and lives alone.   Patient is retired.   Patient has a Scientist, water quality.   Patient has two adult children.   Patient is right-handed.   Patient drinks one cup of coffee daily.      Lives in [place] community   Retired Engineer, civil (consulting)    Allergies:  Allergies  Allergen Reactions  . Procardia [Nifedipine] Rash    All over body    Objective:    Vital Signs:   Temp:  [  97.7 F (36.5 C)] 97.7 F (36.5 C) (09/20 1303) Pulse Rate:  [37-104] 87 (09/20 1530) Resp:  [14-29] 19 (09/20 1530) BP: (81-103)/(56-76) 81/56 (09/20 1530) SpO2:  [85 %-99 %] 99 % (09/20 1530)    Weight change: There were no vitals filed for this visit.  Intake/Output:  No intake or output data in the 24  hours ending 09/03/17 1536    Physical Exam    General:  Elderly. Frail  NAD  No resp difficulty HEENT: normal Neck: supple. JVP 5-6. Carotids 2+ bilat; no bruits. No lymphadenopathy or thyromegaly appreciated. Cor: PMI nondisplaced. Irregular rate & rhythm. No rubs, gallops . 2/6 Aaron/TR. 2/6 AS s2 mildly diminished Lungs: clear Abdomen: soft, nontender, nondistended. No hepatosplenomegaly. No bruits or masses. Good bowel sounds. Extremities: no cyanosis, clubbing, rash, trace-1+ edema Neuro: alert & orientedx3, cranial nerves grossly intact. moves all 4 extremities w/o difficulty. Affect pleasant   Telemetry  Atypical flutter 80s with PVCs personally reviewed  EKG    EKG  ? A flutter 120s   Labs   Basic Metabolic Panel:  Recent Labs Lab 09/03/17 1318  NA 135  K 3.7  CL 96*  CO2 25  GLUCOSE 76  BUN 50*  CREATININE 1.84*  CALCIUM 9.9  MG 2.5*    Liver Function Tests:  Recent Labs Lab 09/03/17 1318  AST 28  ALT 17  ALKPHOS 51  BILITOT 0.9  PROT 6.7  ALBUMIN 3.6   No results for input(s): LIPASE, AMYLASE in the last 168 hours. No results for input(s): AMMONIA in the last 168 hours.  CBC:  Recent Labs Lab 09/03/17 1318  WBC 6.9  NEUTROABS 5.2  HGB 13.9  HCT 42.0  MCV 91.5  PLT 146*    Cardiac Enzymes: No results for input(s): CKTOTAL, CKMB, CKMBINDEX, TROPONINI in the last 168 hours.  BNP: BNP (last 3 results)  Recent Labs  05/12/17 1938 09/03/17 1318  BNP 1,703.1* 567.6*    ProBNP (last 3 results)  Recent Labs  06/29/17 1551 07/22/17 1158 08/04/17 1611  PROBNP 6,585* 6,747* 5,754*     CBG: No results for input(s): GLUCAP in the last 168 hours.  Coagulation Studies:  Recent Labs  09/03/17 1318  LABPROT 17.9*  INR 1.49     Imaging    No results found.   Medications:     Current Medications: . amiodarone  200 mg Oral Daily  . apixaban  5 mg Oral BID  . donepezil  10 mg Oral QHS  . DULoxetine  30 mg Oral  Daily  . finasteride  5 mg Oral Daily  . [START ON 09/04/2017] furosemide  120 mg Oral q morning - 10a  . [START ON 09/04/2017] furosemide  80 mg Oral Daily  . [START ON 09/04/2017] potassium chloride  20 mEq Oral BID  . rosuvastatin  40 mg Oral q1800  . sodium chloride flush  3 mL Intravenous Q12H     Infusions: . sodium chloride         Patient Profile   Aaron Berg is a 81 year old with a history of CAD S/P CABG 2003, chronic combined systolic/diastoic heart failure, HTN, hyperlipidemia, OSA on CPAP, dementia, CVA, and falls.  Sent to the ED from Dr Darliss Ridgel office with A flutter. VT. EP and HF consulted.    Assessment/Plan   1. Aypical Flutter -  EP consulted.  Unable to cariovert because he missed eliquis on Saturday. Will need TEE/DC-CV 2. Acute on chronic combined Systolic/Diastolic Heart  Failure - ICM ECHO 08/2017 EF ~20%.  Volume status stable. Continue current lasix regimen. No bb/ace wih hypotension  3. PAF- on amio 200 mg daily. On eliquis 2.5 mg twice a day.  4. Aortic Stenosis - moderate on echo 5. CKD Stage IV- creatinine 1.8. Follow renal function closely.     Length of Stay: 0  Darrick Grinder, NP  09/03/2017, 3:36 PM  Advanced Heart Failure Team Pager [phone] (M-F; 7a - 4p)  Please contact [facility] for night-coverage after hours (4p -7a ) and weekends on [email or web address]  Patient seen and examined with Darrick Grinder, NP. We discussed all aspects of the encounter. I agree with the assessment and plan as stated above.   Difficult situation. Delightful 81 y/o male with end-stage systolic HF due to iCM with EF 20% and moderate to severe RV dysfunction and moderate AS. At baseline NYHA IIIB with apparent steady worsening over last 6 months. Now admitted with progressive symptoms in setting of atypical AFL.   I have reviewed echo [facility] and also discussed the situation with Dr. Rayann Heman at the bedside. I think the first priority is to get him back in NSR if at all possible  and then reassess how he does and if he would be a candidate for any sort of advanced therapies. Given age, progressive decline and RV dysfunction, I doubt he would be suitable VAD candidate unless he perked up significantly with DC-CV and rehab.Palliative inotropes may be an option but would increase the risk of recurrent AF/AFL.   We will arrange TEE and DC-CV for tomorrow if possible and see how he does from there. Hopefully we can get him home soon and continue his HF evalaution with possible RHC as an outpatient.   Glori Bickers, MD  12:04 AM

## 2017-09-03 NOTE — ED Provider Notes (Signed)
[facility] DEPT Provider Note   CSN: [id number] Arrival date & time: 09/03/17  1248     History   Chief Complaint Chief Complaint  Patient presents with  . Atrial Fibrillation    HPI Aaron Berg is a 81 y.o. male.  Patient is a 81 year old male with a history of aortic stenosis, coronary artery disease, COPD, hypertension and dementia who presents from Dr. Thompson Caul office for admission due to atrial fibrillation.  He was at Dr. Thompson Caul office for routine follow-up and was noted to be in atrial fibrillation at a rate in the 120s. He was sent over here for admission. Patient currently denies any symptoms. He's noted to be mildly tachycardic. He denies any chest pain or shortness of breath. He denies any recent illnesses. He states he feels chronically fatigued.      Past Medical History:  Diagnosis Date  . Aortic stenosis   . Arthritis    "thumbs, wrists, legs, spine" (05/12/2017)  . Atrial flutter ([place])   . Benign prostatic hyperplasia (BPH) with urinary urgency   . CAD (coronary artery disease)    a. s/p CABG 2003.  [name] Cerebrovascular disease 2006   multiple ischemic changes on prior MRI  . Chronic combined systolic and diastolic CHF (congestive heart failure) ([place])   . Chronic lower back pain   . Complication of anesthesia    "woke up too soon once; had 2nd stroke after right knee replacement"  . COPD (chronic obstructive pulmonary disease) ([place])    "no signs or tests"  . CVA (cerebral vascular accident) ([place]) late 1990s; 01/2014  . Dementia   . Depression   . Dry eyes   . Dyslipidemia    well controlled  . Gait disorder   . Heart murmur    "found in ~ 1946"  . Hypertension   . Hypertensive cardiovascular disease   . Male hypogonadism   . Migraine    "major one when I was a kid; very painful"  . Mitral regurgitation   . Multiple falls    "after both knees surgeries; went to rehab; came out and fell all the time; I'm much better now" (05/12/2017)  .  Obesity   . OSA on CPAP    cpap  . Paroxysmal atrial fibrillation (HCC)   . Skin cancer of anterior chest   . Spinal stenosis     Patient Active Problem List   Diagnosis Date Noted  . Wide-complex tachycardia ([place]) 09/03/2017  . Aortic stenosis 07/30/2017  . Coronary artery disease involving coronary bypass graft of native heart with angina pectoris ([place]) 06/23/2017  . Benign prostatic hyperplasia with incomplete bladder emptying 05/21/2017  . Acute systolic heart failure ([place]) 05/12/2017  . Benign essential tremor 09/17/2016  . Rhinitis, allergic 09/11/2016  . Chronic low back pain 09/11/2016  . Status post total bilateral knee replacement 09/11/2016  . Multifactorial gait disorder 05/29/2016  . OSA (obstructive sleep apnea) 03/31/2016  . Late onset Alzheimer's disease without behavioral disturbance 03/31/2016  . Multiple falls 08/09/2014  . Chronic anticoagulation 06/30/2014  . Paroxysmal atrial fibrillation ([place]) 03/03/2014  . Unspecified arthropathy, lower leg 01/29/2014  . Chronic airway obstruction, not elsewhere classified 01/29/2014  . Obesity (BMI 30-39.9) 01/04/2014  . Chronic combined systolic and diastolic CHF (congestive heart failure) ([place]) 10/11/2013  . Essential hypertension, benign 10/11/2013  . Hyperlipidemia with target LDL less than 70 07/09/2011  . Hypogonadism male 07/09/2011    Past Surgical History:  Procedure Laterality Date  . APPENDECTOMY  as child  . CARDIAC CATHETERIZATION  11/2003   Archie Endo 04/29/2011  . CARDIAC CATHETERIZATION N/A 12/03/2016   Procedure: Left Heart Cath and Cors/Grafts Angiography;  Surgeon: Belva Crome, MD;  Location: [facility];  Service: Cardiovascular;  Laterality: N/A;  . CARDIOVERSION N/A 04/26/2014   Procedure: CARDIOVERSION;  Surgeon: Sinclair Grooms, MD;  Location: [facility] ENDOSCOPY;  Service: Cardiovascular;  Laterality: N/A;  . CARDIOVERSION N/A 05/24/2014   Procedure: CARDIOVERSION;  Surgeon: Sinclair Grooms,  MD;  Location: [facility];  Service: Cardiovascular;  Laterality: N/A;  . CATARACT EXTRACTION W/ INTRAOCULAR LENS  IMPLANT, BILATERAL Bilateral 2000s  . CORONARY ANGIOPLASTY  1991   "no stents" (10/06/2016)  . CORONARY ARTERY BYPASS GRAFT  11/2002   X 3/notes 04/29/2011  . CYST EXCISION     lumbar; Dr. Joya Salm  . EYE SURGERY Right    detached retina  . JOINT REPLACEMENT    . MOHS SURGERY     on chest  . RETINAL DETACHMENT SURGERY Right   . SHOULDER SURGERY Right    with murphy, reattach ligaments  . TONSILLECTOMY  as child  . TOTAL KNEE ARTHROPLASTY Right 01/02/2014   Procedure: RIGHT TOTAL KNEE ARTHROPLASTY;  Surgeon: Mauri Pole, MD;  Location: [facility] ORS;  Service: Orthopedics;  Laterality: Right;  . TOTAL KNEE ARTHROPLASTY Left 01/31/2014   Procedure: LEFT TOTAL KNEE ARTHROPLASTY;  Surgeon: Mauri Pole, MD;  Location: [facility] OR[facility];  Service: Orthopedics;  Laterality: Left;  . TOTAL SHOULDER ARTHROPLASTY Right   . VIDEO ASSISTED THORACOSCOPY (VATS)/THOROCOTOMY  2003   benign nodule       Home Medications    Prior to Admission medications   Medication Sig Start Date End Date Taking? Authorizing Provider  amiodarone (PACERONE) 200 MG tablet Take 1 tablet (200 mg total) by mouth daily. 05/16/17   Daune Perch, NP  Budesonide (RHINOCORT ALLERGY NA) Place 1 spray into the nose daily.    [provider]  donepezil (ARICEPT) 23 MG TABS tablet TAKE 1 TABLET AT BEDTIME 03/17/17   Dohmeier, [name], MD  DULoxetine (CYMBALTA) 30 MG capsule TAKE 1 CAPSULE BY MOUTH EVERY DAY 08/03/17   [name], MD  ELIQUIS 5 MG TABS tablet TAKE 1 TABLET TWICE A DAY 06/19/17   Belva Crome, MD  feeding supplement, ENSURE ENLIVE, (ENSURE ENLIVE) LIQD Take 237 mLs by mouth 2 (two) times daily between meals. 10/08/16   Reyne Dumas, MD  finasteride (PROSCAR) 5 MG tablet Take 1 tablet (5 mg total) by mouth daily. 05/21/17   [name], MD  folic acid (FOLVITE) 1 MG tablet Take 1 tablet (1 mg  total) by mouth daily. 10/09/16   Reyne Dumas, MD  furosemide (LASIX) 80 MG tablet Take 1.5 tablets (120mg ) by mouth in the morning.  Take 1 tablet (80mg ) by mouth in the evening. 08/05/17   Belva Crome, MD  KLOR-CON M20 20 MEQ tablet Take 1 tablet (20 mEq total) by mouth 2 (two) times daily. 08/07/17   Belva Crome, MD  Multiple Vitamins-Minerals (MULTIVITAMIN WITH MINERALS) tablet Take 1 tablet by mouth daily. Reported on 01/30/2016    [provider]  nitroGLYCERIN (NITROSTAT) 0.4 MG SL tablet Place 1 tablet (0.4 mg total) under the tongue every 5 (five) minutes as needed for chest pain. 10/07/16   Bhagat, Crista Luria, PA  polyethylene glycol (MIRALAX / GLYCOLAX) packet Take 17 g by mouth daily.    [provider]  rosuvastatin (CRESTOR) 40  MG tablet Take 1 tablet (40 mg total) by mouth daily. Please keep 06/29/17 appt for future refills. 06/18/17   Burtis Junes, NP  sodium chloride (OCEAN) 0.65 % SOLN nasal spray Place 1 spray into both nostrils daily as needed for congestion.     [provider]  Testosterone 20.25 MG/1.25GM (1.62%) GEL Apply 2 Squirts topically daily as needed (occasionally). One pump per shoulder. 03/06/14   Reed, Tiffany L, DO  traMADol (ULTRAM) 50 MG tablet TAKE 1 TABLET BY MOUTH EVERY 8 HOURS AS NEEDED 09/02/17   [name], MD    Family History Family History  Problem Relation Age of Onset  . Pancreatic cancer Father   . CAD Father   . CAD Mother   . Arthritis Sister   . Healthy Sister     Social History Social History  Substance Use Topics  . Smoking status: Former Smoker    Packs/day: 2.00    Years: 33.00    Types: Cigarettes    Quit date: 06/17/1989  . Smokeless tobacco: Never Used  . Alcohol use 4.8 oz/week    8 Shots of liquor per week     Comment: 05/12/2017 "bourbon" (when he has it--ran out 05/2017)     Allergies   Procardia [nifedipine]   Review of Systems Review of Systems  Constitutional: Positive for  fatigue. Negative for chills, diaphoresis and fever.  HENT: Negative for congestion, rhinorrhea and sneezing.   Eyes: Negative.   Respiratory: Negative for cough, chest tightness and shortness of breath.   Cardiovascular: Negative for chest pain and leg swelling.  Gastrointestinal: Negative for abdominal pain, blood in stool, diarrhea, nausea and vomiting.  Genitourinary: Negative for difficulty urinating, flank pain, frequency and hematuria.  Musculoskeletal: Negative for arthralgias and back pain.  Skin: Negative for rash.  Neurological: Negative for dizziness, speech difficulty, weakness, numbness and headaches.     Physical Exam Updated Vital Signs BP 96/75   Pulse (!) 104   Temp 97.7 F (36.5 C) (Oral)   Resp 16   SpO2 96%   Physical Exam  Constitutional: He is oriented to person, place, and time. He appears well-developed and well-nourished.  HENT:  Head: Normocephalic and atraumatic.  Eyes: Pupils are equal, round, and reactive to light.  Neck: Normal range of motion. Neck supple.  Cardiovascular: Regular rhythm.  Tachycardia present.   Murmur heard. Pulmonary/Chest: Effort normal and breath sounds normal. No respiratory distress. He has no wheezes. He has no rales. He exhibits no tenderness.  Abdominal: Soft. Bowel sounds are normal. There is no tenderness. There is no rebound and no guarding.  Musculoskeletal: Normal range of motion. He exhibits no edema.  Lymphadenopathy:    He has no cervical adenopathy.  Neurological: He is alert and oriented to person, place, and time.  Skin: Skin is warm and dry. No rash noted.  Psychiatric: He has a normal mood and affect.     ED Treatments / Results  Labs (all labs ordered are listed, but only abnormal results are displayed) Labs Reviewed  BASIC METABOLIC PANEL  CBC  BRAIN NATRIURETIC PEPTIDE  CBC WITH DIFFERENTIAL/PLATELET  COMPREHENSIVE METABOLIC PANEL  MAGNESIUM  PROTIME-INR  TSH  I-STAT TROPONIN, ED    EKG   EKG Interpretation  Date/Time:  Thursday September 03 2017 12:54:57 EDT Ventricular Rate:  124 PR Interval:    QRS Duration: 144 QT Interval:  440 QTC Calculation: 632 R Axis:   173 Text Interpretation:  Undetermined rhythm Right axis deviation  Non-specific intra-ventricular conduction block Possible Inferior infarct , age undetermined Cannot rule out Anterior infarct , age undetermined Abnormal ECG Confirmed by Malvin Johns [phone]) on 09/03/2017 1:55:26 PM       Radiology No results found.  Procedures Procedures (including critical care time)  Medications Ordered in ED Medications  acetaminophen (TYLENOL) tablet 650 mg (not administered)  ondansetron (ZOFRAN) injection 4 mg (not administered)  0.9 %  sodium chloride infusion (not administered)  sodium chloride flush (NS) 0.9 % injection 3 mL (0 mLs Intravenous Hold 09/03/17 1341)  sodium chloride flush (NS) 0.9 % injection 3 mL (not administered)  furosemide (LASIX) tablet 120 mg (not administered)  amiodarone (PACERONE) tablet 200 mg (not administered)  apixaban (ELIQUIS) tablet 5 mg (not administered)  donepezil (ARICEPT) tablet 10 mg (not administered)  DULoxetine (CYMBALTA) DR capsule 30 mg (not administered)  finasteride (PROSCAR) tablet 5 mg (not administered)  furosemide (LASIX) tablet 80 mg (not administered)  potassium chloride (KLOR-CON) packet 20 mEq (not administered)  rosuvastatin (CRESTOR) tablet 40 mg (not administered)     Initial Impression / Assessment and Plan / ED Course  I have reviewed the triage vital signs and the nursing notes.  Pertinent labs & imaging results that were available during my care of the patient were reviewed by me and considered in my medical decision making (see chart for details).     Patient presents in what appears to be atrial fibrillation with an elevated rate. There is an interventricular conduction delay. Patient's currently asymptomatic. Is to be admitted by Dr. Tamala Julian  with cardiology.  Final Clinical Impressions(s) / ED Diagnoses   Final diagnoses:  Acute on chronic systolic heart failure (HCC)  Wide-complex tachycardia (HCC)  Tachycardia    New Prescriptions New Prescriptions   No medications on file     Malvin Johns, MD 09/03/17 1356

## 2017-09-03 NOTE — ED Triage Notes (Signed)
Pt sent from pcp for atrial fibrillation hr in 130's.

## 2017-09-03 NOTE — ED Notes (Signed)
MD Belfi at the bedside  

## 2017-09-03 NOTE — Progress Notes (Signed)
Cardiology Office Note    Date:  09/03/2017   ID:  Aaron Berg, Aaron Berg March 07, 1936, MRN [id number]  PCP:  [name], MD  Cardiologist: Sinclair Grooms, MD   Chief Complaint  Patient presents with  . Coronary Artery Disease  . Congestive Heart Failure    History of Present Illness:  Aaron Berg is a 81 y.o. male  a history of CAD s/p CABG in 2003, prior cath in 2012 with patent LIMA to LAD, patent SVG to RCA and 50% ostial stenosis of SVG to LCx and EF being 35% at that time. Progressive chronic combined systolic and diastolic heart failure with falling EF over the past 10 months, mild aortic stenosis/mitral regurg by echo in 09/2016, HTN, HLD, pAfib on eliquis, OSA on CPAP, COPD, microvascular dementia, CVA, and falls.   He came into the office today for routine follow-up. No particular complaints but upon check-in noted to have heart rate of 125 bpm. No chest discomfort, dyspnea, but feels somewhat fatigued. He did walk into the office from the parking deck. Over the past several weeks his blood pressures have been running relatively low. Over the past 6-12 months we have noticed an EF dropped from 35-40% to 15%. A recent dobutamine echo stress test excluded the possibility of normal/low pressure gradient severe aortic stenosis. The purpose of today's office visit to discuss results of the recent testing and overall prognosis. We have recently needed to withdrawal heart failure therapies because of hypotension. He physically looks better than his clinical exam. We did discuss resuscitation. He does not want heroic measures but if there are treatable cardiac entities that can sustain his independence he would be in favor of that particular therapy. He does not want prolonged artificial life support. Therefore we have decided that he is still a full code until we further evaluate the current situation.  The tachycardia on EKG today reveals a rate of 125 bpm with a QRS duration and  polarity similar to his baseline EKG. This suggests a supraventricular arrhythmia, possibly atrial tachycardia or slow atrial flutter. I do not believe this is sinus tachycardia.1   Past Medical History:  Diagnosis Date  . Aortic stenosis   . Arthritis    "thumbs, wrists, legs, spine" (05/12/2017)  . Atrial flutter ([place])   . Benign prostatic hyperplasia (BPH) with urinary urgency   . CAD (coronary artery disease)    a. s/p CABG 2003.  [name] Cerebrovascular disease 2006   multiple ischemic changes on prior MRI  . Chronic combined systolic and diastolic CHF (congestive heart failure) ([place])   . Chronic lower back pain   . Complication of anesthesia    "woke up too soon once; had 2nd stroke after right knee replacement"  . COPD (chronic obstructive pulmonary disease) ([place])    "no signs or tests"  . CVA (cerebral vascular accident) ([place]) late 1990s; 01/2014  . Dementia   . Depression   . Dry eyes   . Dyslipidemia    well controlled  . Gait disorder   . Heart murmur    "found in ~ 1946"  . Hypertension   . Hypertensive cardiovascular disease   . Male hypogonadism   . Migraine    "major one when I was a kid; very painful"  . Mitral regurgitation   . Multiple falls    "after both knees surgeries; went to rehab; came out and fell all the time; I'm much better now" (05/12/2017)  . Obesity   .  OSA on CPAP    cpap  . Paroxysmal atrial fibrillation (HCC)   . Skin cancer of anterior chest   . Spinal stenosis     Past Surgical History:  Procedure Laterality Date  . APPENDECTOMY  as child  . CARDIAC CATHETERIZATION  11/2003   Archie Endo 04/29/2011  . CARDIAC CATHETERIZATION N/A 12/03/2016   Procedure: Left Heart Cath and Cors/Grafts Angiography;  Surgeon: Belva Crome, MD;  Location: [facility];  Service: Cardiovascular;  Laterality: N/A;  . CARDIOVERSION N/A 04/26/2014   Procedure: CARDIOVERSION;  Surgeon: Sinclair Grooms, MD;  [facility] ENDOSCOPY;  Service: Cardiovascular;   Laterality: N/A;  . CARDIOVERSION N/A 05/24/2014   Procedure: CARDIOVERSION;  Surgeon: Sinclair Grooms, MD;  Location: [facility];  Service: Cardiovascular;  Laterality: N/A;  . CATARACT EXTRACTION W/ INTRAOCULAR LENS  IMPLANT, BILATERAL Bilateral 2000s  . CORONARY ANGIOPLASTY  1991   "no stents" (10/06/2016)  . CORONARY ARTERY BYPASS GRAFT  11/2002   X 3/notes 04/29/2011  . CYST EXCISION     lumbar; Dr. Joya Salm  . EYE SURGERY Right    detached retina  . JOINT REPLACEMENT    . MOHS SURGERY     on chest  . RETINAL DETACHMENT SURGERY Right   . SHOULDER SURGERY Right    with murphy, reattach ligaments  . TONSILLECTOMY  as child  . TOTAL KNEE ARTHROPLASTY Right 01/02/2014   Procedure: RIGHT TOTAL KNEE ARTHROPLASTY;  Surgeon: Mauri Pole, MD;  Location: [facility];  Service: Orthopedics;  Laterality: Right;  . TOTAL KNEE ARTHROPLASTY Left 01/31/2014   Procedure: LEFT TOTAL KNEE ARTHROPLASTY;  Surgeon: Mauri Pole, MD;  Location: [facility];  Service: Orthopedics;  Laterality: Left;  . TOTAL SHOULDER ARTHROPLASTY Right   . VIDEO ASSISTED THORACOSCOPY (VATS)/THOROCOTOMY  2003   benign nodule    Current Medications: Outpatient Medications Prior to Visit  Medication Sig Dispense Refill  . amiodarone (PACERONE) 200 MG tablet Take 1 tablet (200 mg total) by mouth daily. 30 tablet 5  . Budesonide (RHINOCORT ALLERGY NA) Place 1 spray into the nose daily.    [name] donepezil (ARICEPT) 23 MG TABS tablet TAKE 1 TABLET AT BEDTIME 90 tablet 1  . DULoxetine (CYMBALTA) 30 MG capsule TAKE 1 CAPSULE BY MOUTH EVERY DAY 30 capsule 3  . ELIQUIS 5 MG TABS tablet TAKE 1 TABLET TWICE A DAY 180 tablet 3  . feeding supplement, ENSURE ENLIVE, (ENSURE ENLIVE) LIQD Take 237 mLs by mouth 2 (two) times daily between meals. 237 mL 12  . finasteride (PROSCAR) 5 MG tablet Take 1 tablet (5 mg total) by mouth daily. 90 tablet 3  . folic acid (FOLVITE) 1 MG tablet Take 1 tablet (1 mg total) by mouth daily. 30 tablet 1  .  furosemide (LASIX) 80 MG tablet Take 1.5 tablets (120mg ) by mouth in the morning.  Take 1 tablet (80mg ) by mouth in the evening. 225 tablet 3  . KLOR-CON M20 20 MEQ tablet Take 1 tablet (20 mEq total) by mouth 2 (two) times daily. 180 tablet 3  . Multiple Vitamins-Minerals (MULTIVITAMIN WITH MINERALS) tablet Take 1 tablet by mouth daily. Reported on 01/30/2016    . nitroGLYCERIN (NITROSTAT) 0.4 MG SL tablet Place 1 tablet (0.4 mg total) under the tongue every 5 (five) minutes as needed for chest pain. 25 tablet 12  . polyethylene glycol (MIRALAX / GLYCOLAX) packet Take 17 g by mouth daily.    . rosuvastatin (CRESTOR) 40 MG tablet Take  1 tablet (40 mg total) by mouth daily. Please keep 06/29/17 appt for future refills. 90 tablet 3  . sodium chloride (OCEAN) 0.65 % SOLN nasal spray Place 1 spray into both nostrils daily as needed for congestion.     . Testosterone 20.25 MG/1.25GM (1.62%) GEL Apply 2 Squirts topically daily as needed (occasionally). One pump per shoulder.    . traMADol (ULTRAM) 50 MG tablet TAKE 1 TABLET BY MOUTH EVERY 8 HOURS AS NEEDED 50 tablet 0   No facility-administered medications prior to visit.      Allergies:   Procardia [nifedipine]   Social History   Social History  . Marital status: Divorced    Spouse name: N/A  . Number of children: 2  . Years of education: Masters   Occupational History  .      retired   Social History Main Topics  . Smoking status: Former Smoker    Packs/day: 2.00    Years: 33.00    Types: Cigarettes    Quit date: 06/17/1989  . Smokeless tobacco: Never Used  . Alcohol use 4.8 oz/week    8 Shots of liquor per week     Comment: 05/12/2017 "bourbon" (when he has it--ran out 05/2017)  . Drug use: No  . Sexual activity: Not Currently   Other Topics Concern  . None   Social History Narrative   Patient is single and lives alone.   Patient is retired.   Patient has a Scientist, water quality.   Patient has two adult children.   Patient is  right-handed.   Patient drinks one cup of coffee daily.      Lives in [place] community   Retired Engineer, civil (consulting)     Family History:  The patient's family history includes Arthritis in his sister; CAD in his father and mother; Healthy in his sister; Pancreatic cancer in his father.   ROS:   Please see the history of present illness.    Tremor, poor appetite, weakness, dyspnea on exertion, exertional fatigue.  All other systems reviewed and are negative.   PHYSICAL EXAM:   VS:  BP 96/70 (BP Location: Right Arm)   Pulse (!) 125   Ht 5\' 8"  (1.727 m)   Wt 176 lb 6.4 oz (80 kg)   BMI 26.82 kg/m    GEN: Well nourished, well developed, in no acute distress Hands and feet are cold. HEENT: normal  Neck: no JVD, carotid bruits, or masses Cardiac: RRR; rubs, or gallops,no edema . Left parasternal systolic murmur is noted. No diastolic murmur. Respiratory:  clear to auscultation bilaterally, normal work of breathing GI: soft, nontender, nondistended, + BS. MS: no deformity or atrophy . Lower extremities are cyanotic and livedo reticularis is noted around the knees and in the thigh area. Skin: warm and dry, no rash Neuro:  Alert and Oriented x 3, Strength and sensation are intact Psych: euthymic mood, full affect  Wt Readings from Last 3 Encounters:  09/03/17 176 lb 6.4 oz (80 kg)  08/27/17 183 lb 6.8 oz (83.2 kg)  08/04/17 183 lb 12.8 oz (83.4 kg)      Studies/Labs Reviewed:   EKG:  EKG  Wide complex tachycardia with visible P waves at approximately 125-130 bpm. QRS polarity is similar to baseline EKG. There is left axis deviation with QRS appearance of the left bundle branch block.  Recent Labs: 05/12/2017: B Natriuretic Peptide 1,703.1; TSH 1.397 05/14/2017: Magnesium 1.9 05/21/2017: ALT 13 06/23/2017: Hemoglobin 14.2; Platelets 167 08/04/2017: BUN 46; Creatinine,  Ser 1.49; NT-Pro BNP 5,754; Potassium 4.2; Sodium 141   Lipid Panel    Component Value Date/Time   CHOL  191 01/09/2016 0001   TRIG 251 (H) 01/09/2016 0001   HDL 56 01/09/2016 0001   CHOLHDL 3.4 01/09/2016 0001   VLDL 50 (H) 01/09/2016 0001   LDLCALC 85 01/09/2016 0001    Additional studies/ records that were reviewed today include:   December 2017 nuclear stress test: Study Highlights   Nuclear stress EF: 41%.  There was no ST segment deviation noted during stress.  There is a medium defect of moderate severity present in the basal inferior, mid inferior, apical lateral and apex location. The defect is non-reversible and consistent with prior scar of diaphragmatic attenuation artifact.  This is a high risk study.  The left ventricular ejection fraction is moderately decreased (30-44%).   Cardiac catheterization December 2017: Coronary Diagrams  Diagnostic Diagram          Echocardiogram July 2018: Study Conclusions   - Left ventricle: The cavity size was moderately dilated. Wall   thickness was normal. Systolic function was severely reduced. The   estimated ejection fraction was in the range of 15% to 20%.   Severe diffuse hypokinesis. Doppler parameters are consistent   with restrictive physiology, indicative of decreased left   ventricular diastolic compliance and/or increased left atrial   pressure. No evidence of thrombus. Acoustic contrast   opacification revealed no evidence ofthrombus. - Aortic valve: Valve mobility was moderately restricted.   Transvalvular velocity was increased less than expected, due to   low cardiac output. There was moderate stenosis. Valve area   (VTI): 1.13 cm^2. Valve area (Vmax): 1.3 cm^2. Valve area   (Vmean): 1.29 cm^2. - Mitral valve: Calcified annulus. Mildly thickened leaflets .   There was mild to moderate regurgitation directed centrally. - Left atrium: The atrium was moderately to severely dilated. - Right ventricle: The cavity size was mildly dilated. Systolic   function was moderately reduced. - Right atrium: The atrium  was mildly dilated. - Pulmonary arteries: Systolic pressure was mildly increased. PA   peak pressure: 35 mm Hg (S).   Impressions:   - Compared to 10/08/2016, left ventricular systolic function has   decreased markedly and aortic gradients are lower. Suspect aortic   stenosis is only moderate, but dobutamine echo may be useful to   evaluate for low gradient severe aortic stenosis.   Dobutamine stress echo September 2018:  Impressions:   - Baseline: Peak velocity 2.6 m/sec, mean gradient 14 mmHg, peak 27   mmHg AVA 1.4 cm2 SV = 73 cc   Peak Dobutamine Peak velocity 3.1 m/sec mean gradient 20 mmHg   peak 39 mmHg AVA 1.2 cm2 SV = 84 cc     Overall would consider the patient to have poor ventricular   reserve with SV only increasing only 15% with peak   Dobutamine and Aortic Stenosis appearing moderate       ASSESSMENT:    1. Wide-complex tachycardia ([place])   2. Chronic combined systolic and diastolic CHF (congestive heart failure) ([place])   3. Essential hypertension, benign   4. Paroxysmal atrial fibrillation (HCC)   5. Coronary artery disease involving coronary bypass graft of native heart with angina pectoris ([place])   6. Nonrheumatic aortic valve stenosis   7. OSA (obstructive sleep apnea)   8. Chronic anticoagulation   9. Hyperlipidemia with target LDL less than 70      PLAN:  In order of problems  listed above:  1. There is clear evidence that atrial activity is driving the current tachycardia and the polarity of the patient's QRS complex is very similar to baseline. I don't believe this is sinus tachycardia. Rule out atrial tachycardia or atrial flutter with slowed flutter cycle length. Given EF of 15% documented recently, I'm admitting him to the hospital to help resolve the tachycardia. We'll ask EP to see the patient. Continue amiodarone. Continue anticoagulation. 2. Progressive decline in LV systolic function with above workup as noted and with recent progressive  decrease in blood pressure requiring that ACE/ARB therapy be discontinued. We will likely need to have an advanced heart failure therapy consult. If prognosis is bleak, I will speak to the patient about palliative care. He has expressed a desire not to have prolonged life support. He has not reversed to CPR if we have reasonable treatment strategy to maintain independent living. 3. Relatively low blood pressures recently have led to reduction and ultimately discontinuation of ARB and long-acting nitrate therapy. 4. Prior history of both paroxysmal atrial fibrillation and atrial flutter leading to chronic amiodarone therapy for rhythm control. 5. Currently has no anginal complaints and very little dyspnea. Angiography within the last 12 months revealed patent bypass grafts. 6. He has moderate aortic stenosis recently documented as such by dobutamine echo. 7. Managed with CPap 8. Continue current anticoagulation strategy   The patient is a being admitted to the hospital because of uncontrolled supraventricular tachycardia in the setting of severe left ventricular systolic function. The tachycardia appears to be a supraventricular rhythm disturbance. Cannot totally exclude the possibility of a ventricular dysrhythmia. All have both EP and advanced heart failure due to see the patient.   Medication Adjustments/Labs and Tests Ordered: Current medicines are reviewed at length with the patient today.  Concerns regarding medicines are outlined above.  Medication changes, Labs and Tests ordered today are listed in the Patient Instructions below. Patient Instructions  Medication Instructions:  None  Labwork: None  Testing/Procedures: None  Follow-Up: Your physician recommends that you schedule a follow-up appointment in: 4-6 weeks with Dr. Tamala Julian.    Any Other Special Instructions Will Be Listed Below (If Applicable).  Please report to [facility] ER to be admitted.    If you need a refill on  your cardiac medications before your next appointment, please call your pharmacy.      Signed, Sinclair Grooms, MD  09/03/2017 11:40 AM    [facility] [address], [place], [place]  [address] Phone: [phone]; Fax: [phone]

## 2017-09-03 NOTE — H&P (Signed)
CARDIOLOGY ADMISSION NOTE      Date: 09/03/2017  ID: BROADY LAFOY, DOB [birth year], MRN [id number]  PCP: [name], MD  Cardiologist: Sinclair Grooms, MD     Chief Complaint  Patient presents with  . Coronary Artery Disease  . Congestive Heart Failure   History of Present Illness:  Aaron Berg is a 81 y.o. male a history of CAD s/p CABG in 2003, prior cath in 2012 with patent LIMA to LAD, patent SVG to RCA and 50% ostial stenosis of SVG to LCx and EF being 35% at that time. Progressive chronic combined systolic and diastolic heart failure with falling EF over the past 10 months, mild aortic stenosis/mitral regurg by echo in 09/2016, HTN, HLD, pAfib on eliquis, OSA on CPAP, COPD, microvascular dementia, CVA, and falls.  He came into the office today for routine follow-up. No particular complaints but upon check-in noted to have heart rate of 125 bpm. No chest discomfort, dyspnea, but feels somewhat fatigued. He did walk into the office from the parking deck. Over the past several weeks his blood pressures have been running relatively low. Over the past 6-12 months we have noticed an EF dropped from 35-40% to 15%. A recent dobutamine echo stress test excluded the possibility of normal/low pressure gradient severe aortic stenosis. The purpose of today's office visit to discuss results of the recent testing and overall prognosis. We have recently needed to withdrawal heart failure therapies because of hypotension. He physically looks better than his clinical exam. We did discuss resuscitation. He does not want heroic measures but if there are treatable cardiac entities that can sustain his independence he would be in favor of that particular therapy. He does not want prolonged artificial life support. Therefore we have decided that he is still a full code until we further evaluate the current situation.  The tachycardia on EKG today reveals a rate of 125 bpm with a QRS duration and  polarity similar to his baseline EKG. This suggests a supraventricular arrhythmia, possibly atrial tachycardia or slow atrial flutter. I do not believe this is sinus tachycardia.1      Past Medical History:  Diagnosis Date  . Aortic stenosis   . Arthritis    "thumbs, wrists, legs, spine" (05/12/2017)  . Atrial flutter ([place])   . Benign prostatic hyperplasia (BPH) with urinary urgency   . CAD (coronary artery disease)    a. s/p CABG 2003.  [name] Cerebrovascular disease 2006   multiple ischemic changes on prior MRI  . Chronic combined systolic and diastolic CHF (congestive heart failure) ([place])   . Chronic lower back pain   . Complication of anesthesia    "woke up too soon once; had 2nd stroke after right knee replacement"  . COPD (chronic obstructive pulmonary disease) ([place])    "no signs or tests"  . CVA (cerebral vascular accident) ([place]) late 1990s; 01/2014  . Dementia   . Depression   . Dry eyes   . Dyslipidemia    well controlled  . Gait disorder   . Heart murmur    "found in ~ 1946"  . Hypertension   . Hypertensive cardiovascular disease   . Male hypogonadism   . Migraine    "major one when I was a kid; very painful"  . Mitral regurgitation   . Multiple falls    "after both knees surgeries; went to rehab; came out and fell all the time; I'm much better now" (05/12/2017)  . Obesity   .  OSA on CPAP    cpap  . Paroxysmal atrial fibrillation (HCC)   . Skin cancer of anterior chest   . Spinal stenosis         Past Surgical History:  Procedure Laterality Date  . APPENDECTOMY  as child  . CARDIAC CATHETERIZATION  11/2003   Archie Endo 04/29/2011  . CARDIAC CATHETERIZATION N/A 12/03/2016   Procedure: Left Heart Cath and Cors/Grafts Angiography; Surgeon: Belva Crome, MD; Location: [place] [facility]; Service: Cardiovascular; Laterality: N/A;  . CARDIOVERSION N/A 04/26/2014   Procedure: CARDIOVERSION; Surgeon: Sinclair Grooms, MD;  Location: [facility] ENDOSCOPY; Service: Cardiovascular; Laterality: N/A;  . CARDIOVERSION N/A 05/24/2014   Procedure: CARDIOVERSION; Surgeon: Sinclair Grooms, MD; Location: [place]; Service: Cardiovascular; Laterality: N/A;  . CATARACT EXTRACTION W/ INTRAOCULAR LENS IMPLANT, BILATERAL Bilateral 2000s  . CORONARY ANGIOPLASTY  1991   "no stents" (10/06/2016)  . CORONARY ARTERY BYPASS GRAFT  11/2002   X 3/notes 04/29/2011  . CYST EXCISION     lumbar; Dr. Joya Salm  . EYE SURGERY Right    detached retina  . JOINT REPLACEMENT    . MOHS SURGERY     on chest  . RETINAL DETACHMENT SURGERY Right   . SHOULDER SURGERY Right    with murphy, reattach ligaments  . TONSILLECTOMY  as child  . TOTAL KNEE ARTHROPLASTY Right 01/02/2014   Procedure: RIGHT TOTAL KNEE ARTHROPLASTY; Surgeon: Mauri Pole, MD; Location: [facility]; Service: Orthopedics; Laterality: Right;  . TOTAL KNEE ARTHROPLASTY Left 01/31/2014   Procedure: LEFT TOTAL KNEE ARTHROPLASTY; Surgeon: Mauri Pole, MD; Location: [facility]; Service: Orthopedics; Laterality: Left;  . TOTAL SHOULDER ARTHROPLASTY Right   . VIDEO ASSISTED THORACOSCOPY (VATS)/THOROCOTOMY  2003   benign nodule   Current Medications:        Outpatient Medications Prior to Visit  Medication Sig Dispense Refill  . amiodarone (PACERONE) 200 MG tablet Take 1 tablet (200 mg total) by mouth daily. 30 tablet 5  . Budesonide (RHINOCORT ALLERGY NA) Place 1 spray into the nose daily.    [name] donepezil (ARICEPT) 23 MG TABS tablet TAKE 1 TABLET AT BEDTIME 90 tablet 1  . DULoxetine (CYMBALTA) 30 MG capsule TAKE 1 CAPSULE BY MOUTH EVERY DAY 30 capsule 3  . ELIQUIS 5 MG TABS tablet TAKE 1 TABLET TWICE A DAY 180 tablet 3  . feeding supplement, ENSURE ENLIVE, (ENSURE ENLIVE) LIQD Take 237 mLs by mouth 2 (two) times daily between meals. 237 mL 12  . finasteride (PROSCAR) 5 MG tablet Take 1 tablet (5 mg total) by mouth daily. 90 tablet 3  . folic acid (FOLVITE) 1  MG tablet Take 1 tablet (1 mg total) by mouth daily. 30 tablet 1  . furosemide (LASIX) 80 MG tablet Take 1.5 tablets (120mg ) by mouth in the morning. Take 1 tablet (80mg ) by mouth in the evening. 225 tablet 3  . KLOR-CON M20 20 MEQ tablet Take 1 tablet (20 mEq total) by mouth 2 (two) times daily. 180 tablet 3  . Multiple Vitamins-Minerals (MULTIVITAMIN WITH MINERALS) tablet Take 1 tablet by mouth daily. Reported on 01/30/2016    . nitroGLYCERIN (NITROSTAT) 0.4 MG SL tablet Place 1 tablet (0.4 mg total) under the tongue every 5 (five) minutes as needed for chest pain. 25 tablet 12  . polyethylene glycol (MIRALAX / GLYCOLAX) packet Take 17 g by mouth daily.    . rosuvastatin (CRESTOR) 40 MG tablet Take 1 tablet (40 mg total) by mouth daily. Please keep 06/29/17 appt  for future refills. 90 tablet 3  . sodium chloride (OCEAN) 0.65 % SOLN nasal spray Place 1 spray into both nostrils daily as needed for congestion.     . Testosterone 20.25 MG/1.25GM (1.62%) GEL Apply 2 Squirts topically daily as needed (occasionally). One pump per shoulder.    . traMADol (ULTRAM) 50 MG tablet TAKE 1 TABLET BY MOUTH EVERY 8 HOURS AS NEEDED 50 tablet 0   No facility-administered medications prior to visit.    Allergies: Procardia [nifedipine]  Social History        Social History  . Marital status: Divorced    Spouse name: N/A  . Number of children: 2  . Years of education: Masters        Occupational History  .      retired         Social History Main Topics  . Smoking status: Former Smoker    Packs/day: 2.00    Years: 33.00    Types: Cigarettes    Quit date: 06/17/1989  . Smokeless tobacco: Never Used  . Alcohol use 4.8 oz/week    8 Shots of liquor per week     Comment: 05/12/2017 "bourbon" (when he has it--ran out 05/2017)  . Drug use: No  . Sexual activity: Not Currently       Other Topics Concern  . None      Social History Narrative    Patient is single and lives alone.   Patient is retired.   Patient has a Scientist, water quality.   Patient has two adult children.   Patient is right-handed.   Patient drinks one cup of coffee daily.      Lives in [place] community   Retired Engineer, civil (consulting)   Family History: The patient's family history includes Arthritis in his sister; CAD in his father and mother; Healthy in his sister; Pancreatic cancer in his father.  ROS:  Please see the history of present illness.  Tremor, poor appetite, weakness, dyspnea on exertion, exertional fatigue.  All other systems reviewed and are negative.  PHYSICAL EXAM:   VS: BP 96/70 (BP Location: Right Arm)  Pulse (!) 125  Ht 5\' 8"  (1.727 m)  Wt 176 lb 6.4 oz (80 kg)  BMI 26.82 kg/m  GEN: Well nourished, well developed, in no acute distress Hands and feet are cold.  HEENT: normal  Neck: no JVD, carotid bruits, or masses  Cardiac: RRR; rubs, or gallops,no edema . Left parasternal systolic murmur is noted. No diastolic murmur.  Respiratory: clear to auscultation bilaterally, normal work of breathing  GI: soft, nontender, nondistended, + BS.  MS: no deformity or atrophy . Lower extremities are cyanotic and livedo reticularis is noted around the knees and in the thigh area.  Skin: warm and dry, no rash  Neuro: Alert and Oriented x 3, Strength and sensation are intact  Psych: euthymic mood, full affect     Wt Readings from Last 3 Encounters:  09/03/17 176 lb 6.4 oz (80 kg)  08/27/17 183 lb 6.8 oz (83.2 kg)  08/04/17 183 lb 12.8 oz (83.4 kg)   Studies/Labs Reviewed:  EKG: EKG Wide complex tachycardia with visible P waves at approximately 125-130 bpm. QRS polarity is similar to baseline EKG. There is left axis deviation with QRS appearance of the left bundle branch block.  Recent Labs:  05/12/2017: B Natriuretic Peptide 1,703.1; TSH 1.397  05/14/2017: Magnesium 1.9  05/21/2017: ALT 13  06/23/2017: Hemoglobin 14.2; Platelets 167    08/04/2017: BUN 46;  Creatinine, Ser 1.49; NT-Pro BNP 5,754; Potassium 4.2; Sodium 141  Lipid Panel        Labs (Brief)                                                     Additional studies/ records that were reviewed today include:  December 2017 nuclear stress test:  Study Highlights   Nuclear stress EF: 41%.   There was no ST segment deviation noted during stress.   There is a medium defect of moderate severity present in the basal inferior, mid inferior, apical lateral and apex location. The defect is non-reversible and consistent with prior scar of diaphragmatic attenuation artifact.   This is a high risk study.   The left ventricular ejection fraction is moderately decreased (30-44%).  Cardiac catheterization December 2017:  Coronary Diagrams  Diagnostic Diagram     Echocardiogram July 2018:  Study Conclusions  - Left ventricle: The cavity size was moderately dilated. Wall  thickness was normal. Systolic function was severely reduced. The  estimated ejection fraction was in the range of 15% to 20%.  Severe diffuse hypokinesis. Doppler parameters are consistent  with restrictive physiology, indicative of decreased left  ventricular diastolic compliance and/or increased left atrial  pressure. No evidence of thrombus. Acoustic contrast  opacification revealed no evidence ofthrombus.  - Aortic valve: Valve mobility was moderately restricted.  Transvalvular velocity was increased less than expected, due to  low cardiac output. There was moderate stenosis. Valve area  (VTI): 1.13 cm^2. Valve area (Vmax): 1.3 cm^2. Valve area  (Vmean): 1.29 cm^2.  - Mitral valve: Calcified annulus. Mildly thickened leaflets .  There was mild to moderate regurgitation directed centrally.  - Left atrium: The atrium was moderately to severely dilated.  - Right ventricle: The cavity size was mildly dilated. Systolic  function was moderately reduced.  -  Right atrium: The atrium was mildly dilated.  - Pulmonary arteries: Systolic pressure was mildly increased. PA  peak pressure: 35 mm Hg (S).  Impressions:  - Compared to 10/08/2016, left ventricular systolic function has  decreased markedly and aortic gradients are lower. Suspect aortic  stenosis is only moderate, but dobutamine echo may be useful to  evaluate for low gradient severe aortic stenosis.  Dobutamine stress echo September 2018:  Impressions:  - Baseline: Peak velocity 2.6 m/sec, mean gradient 14 mmHg, peak 27  mmHg AVA 1.4 cm2 SV = 73 cc  Peak Dobutamine Peak velocity 3.1 m/sec mean gradient 20 mmHg  peak 39 mmHg AVA 1.2 cm2 SV = 84 cc  Overall would consider the patient to have poor ventricular  reserve with SV only increasing only 15% with peak  Dobutamine and Aortic Stenosis appearing moderate  ASSESSMENT:    1. Wide-complex tachycardia ([place])   2. Chronic combined systolic and diastolic CHF (congestive heart failure) ([place])   3. Essential hypertension, benign   4. Paroxysmal atrial fibrillation (HCC)   5. Coronary artery disease involving coronary bypass graft of native heart with angina pectoris ([place])   6. Nonrheumatic aortic valve stenosis   7. OSA (obstructive sleep apnea)   8. Chronic anticoagulation   9. Hyperlipidemia with target LDL less than 70    PLAN:  In order of problems listed above:  1. There is clear evidence that atrial activity is driving the current tachycardia and the  polarity of the patient's QRS complex is very similar to baseline. I don't believe this is sinus tachycardia. Rule out atrial tachycardia or atrial flutter with slowed flutter cycle length. Given EF of 15% documented recently, I'm admitting him to the hospital to help resolve the tachycardia. We'll ask EP to see the patient. Continue amiodarone. Continue anticoagulation. 2. Progressive decline in LV systolic function with above workup as noted and with recent progressive decrease in  blood pressure requiring that ACE/ARB therapy be discontinued. We will likely need to have an advanced heart failure therapy consult. If prognosis is bleak, I will speak to the patient about palliative care. He has expressed a desire not to have prolonged life support. He has not reversed to CPR if we have reasonable treatment strategy to maintain independent living. 3. Relatively low blood pressures recently have led to reduction and ultimately discontinuation of ARB and long-acting nitrate therapy. 4. Prior history of both paroxysmal atrial fibrillation and atrial flutter leading to chronic amiodarone therapy for rhythm control. 5. Currently has no anginal complaints and very little dyspnea. Angiography within the last 12 months revealed patent bypass grafts. 6. He has moderate aortic stenosis recently documented as such by dobutamine echo. 7. Managed with CPap 8. Continue current anticoagulation strategy The patient is a being admitted to the hospital because of uncontrolled supraventricular tachycardia in the setting of severe left ventricular systolic function. The tachycardia appears to be a supraventricular rhythm disturbance. Cannot totally exclude the possibility of a ventricular dysrhythmia. All have both EP and advanced heart failure service to see.   Signed, Sinclair Grooms, MD 9/20/201811:40 AM [facility] [place], [place], [place] [address] Phone: [phone]; Fax: [phone]

## 2017-09-03 NOTE — ED Notes (Signed)
Attempted report x1.  Name and callback number provided.   

## 2017-09-03 NOTE — Consult Note (Addendum)
ELECTROPHYSIOLOGY CONSULT NOTE    Patient ID: Aaron Berg MRN: [id number], DOB/AGE: [birth year] 81 y.o.  Admit date: 09/03/2017 Date of Consult: 09/03/2017  Primary Physician: [name], MD Primary Cardiologist: Tamala Julian Electrophysiologist: Rondo Spittler (new this admission)  Patient Profile: Aaron Berg is a 81 y.o. male with a history of ICM, CAD s/p CABG, CHF who is being seen today for the evaluation of atypical atrial flutter at the request of Dr Tamala Julian.  HPI:  Aaron Berg is a 81 y.o. male who presented to the office today for routine follow up. On arrival, he was found to be in atypical atrial flutter at a rate of 124bpm. He ha had increasing fatigue, exercise intolerance over the past few months, but more so over the past few weeks. He has been taking amiodarone and eliquis but missed a dose of eliquis Saturday night. He is unaware of when he went into flutter.  He does not have palpitations.  He currently denies chest pain, palpitations, nausea, vomiting, dizziness, syncope, edema, weight gain, or early satiety.  Echo 06/2017 demonstrated EF 15-20%, moderate AS, LA 49.   Past Medical History:  Diagnosis Date  . Aortic stenosis   . Arthritis    "thumbs, wrists, legs, spine" (05/12/2017)  . Atrial flutter ([place])   . Benign prostatic hyperplasia (BPH) with urinary urgency   . CAD (coronary artery disease)    a. s/p CABG 2003.  [name] Cerebrovascular disease 2006   multiple ischemic changes on prior MRI  . Chronic combined systolic and diastolic CHF (congestive heart failure) ([place])   . Chronic lower back pain   . Complication of anesthesia    "woke up too soon once; had 2nd stroke after right knee replacement"  . COPD (chronic obstructive pulmonary disease) ([place])    "no signs or tests"  . CVA (cerebral vascular accident) ([place]) late 1990s; 01/2014  . Dementia   . Depression   . Dry eyes   . Dyslipidemia    well controlled  . Gait disorder   . Heart murmur    "found in  ~ 1946"  . Hypertension   . Hypertensive cardiovascular disease   . Male hypogonadism   . Migraine    "major one when I was a kid; very painful"  . Mitral regurgitation   . Multiple falls    "after both knees surgeries; went to rehab; came out and fell all the time; I'm much better now" (05/12/2017)  . Obesity   . OSA on CPAP    cpap  . Paroxysmal atrial fibrillation (HCC)   . Skin cancer of anterior chest   . Spinal stenosis      Surgical History:  Past Surgical History:  Procedure Laterality Date  . APPENDECTOMY  as child  . CARDIAC CATHETERIZATION  11/2003   Archie Endo 04/29/2011  . CARDIAC CATHETERIZATION N/A 12/03/2016   Procedure: Left Heart Cath and Cors/Grafts Angiography;  Surgeon: Belva Crome, MD;  Location: [place] [facility];  Service: Cardiovascular;  Laterality: N/A;  . CARDIOVERSION N/A 04/26/2014   Procedure: CARDIOVERSION;  Surgeon: Sinclair Grooms, MD;  Location: [facility] ENDOSCOPY;  Service: Cardiovascular;  Laterality: N/A;  . CARDIOVERSION N/A 05/24/2014   Procedure: CARDIOVERSION;  Surgeon: Sinclair Grooms, MD;  Location: [place];  Service: Cardiovascular;  Laterality: N/A;  . CATARACT EXTRACTION W/ INTRAOCULAR LENS  IMPLANT, BILATERAL Bilateral 2000s  . CORONARY ANGIOPLASTY  1991   "no stents" (10/06/2016)  . CORONARY ARTERY BYPASS GRAFT  11/2002   X 3/notes 04/29/2011  . CYST EXCISION     lumbar; Dr. Joya Salm  . EYE SURGERY Right    detached retina  . JOINT REPLACEMENT    . MOHS SURGERY     on chest  . RETINAL DETACHMENT SURGERY Right   . SHOULDER SURGERY Right    with murphy, reattach ligaments  . TONSILLECTOMY  as child  . TOTAL KNEE ARTHROPLASTY Right 01/02/2014   Procedure: RIGHT TOTAL KNEE ARTHROPLASTY;  Surgeon: Mauri Pole, MD;  [facility];  Service: Orthopedics;  Laterality: Right;  . TOTAL KNEE ARTHROPLASTY Left 01/31/2014   Procedure: LEFT TOTAL KNEE ARTHROPLASTY;  Surgeon: Mauri Pole, MD;  Location: [facility];  Service: Orthopedics;   Laterality: Left;  . TOTAL SHOULDER ARTHROPLASTY Right   . VIDEO ASSISTED THORACOSCOPY (VATS)/THOROCOTOMY  2003   benign nodule      (Not in a hospital admission)  Inpatient Medications: . amiodarone  200 mg Oral Daily  . apixaban  5 mg Oral BID  . donepezil  10 mg Oral QHS  . DULoxetine  30 mg Oral Daily  . finasteride  5 mg Oral Daily  . [START ON 09/04/2017] furosemide  120 mg Oral q morning - 10a  . [START ON 09/04/2017] furosemide  80 mg Oral Daily  . [START ON 09/04/2017] potassium chloride  20 mEq Oral BID  . rosuvastatin  40 mg Oral q1800  . sodium chloride flush  3 mL Intravenous Q12H    Allergies:  Allergies  Allergen Reactions  . Procardia [Nifedipine] Rash    Rash develops all over the body    Social History   Social History  . Marital status: Divorced    Spouse name: N/A  . Number of children: 2  . Years of education: Masters   Occupational History  .      retired   Social History Main Topics  . Smoking status: Former Smoker    Packs/day: 2.00    Years: 33.00    Types: Cigarettes    Quit date: 06/17/1989  . Smokeless tobacco: Never Used  . Alcohol use 4.8 oz/week    8 Shots of liquor per week     Comment: 05/12/2017 "bourbon" (when he has it--ran out 05/2017)  . Drug use: No  . Sexual activity: Not Currently   Other Topics Concern  . Not on file   Social History Narrative   Patient is single and lives alone.   Patient is retired.   Patient has a Scientist, water quality.   Patient has two adult children.   Patient is right-handed.   Patient drinks one cup of coffee daily.      Lives in [place] community   Retired Engineer, civil (consulting)     Family History  Problem Relation Age of Onset  . Pancreatic cancer Father   . CAD Father   . CAD Mother   . Arthritis Sister   . Healthy Sister      Review of Systems: All other systems reviewed and are otherwise negative except as noted above.  Physical Exam: Vitals:   09/03/17 1504 09/03/17 1505  09/03/17 1515 09/03/17 1530  BP: 99/76 99/76 90/72  (!) 81/56  Pulse: 93 86 80 87  Resp: 18 (!) 23  19  Temp:      TempSrc:      SpO2: 95% 97% 98% 99%    GEN- The patient is elderly and chronically ill appearing, alert and oriented x 3 today.  HEENT: normocephalic, atraumatic; sclera clear, conjunctiva pink; hearing intact; oropharynx clear; neck supple Lungs- Clear to ausculation bilaterally, normal work of breathing.  No wheezes, rales, rhonchi Heart- Tachycardic irregular rate and rhythm,  GI- soft, non-tender, non-distended, bowel sounds present Extremities- no clubbing, cyanosis, trace BLE edema  MS- no significant deformity or atrophy Skin- warm and dry, no rash or lesion Psych- euthymic mood, full affect Neuro- strength and sensation are intact  Labs:  Lab Results  Component Value Date   WBC 6.9 09/03/2017   HGB 13.9 09/03/2017   HCT 42.0 09/03/2017   MCV 91.5 09/03/2017   PLT 146 (L) 09/03/2017    Recent Labs Lab 09/03/17 1318  NA 135  K 3.7  CL 96*  CO2 25  BUN 50*  CREATININE 1.84*  CALCIUM 9.9  PROT 6.7  BILITOT 0.9  ALKPHOS 51  ALT 17  AST 28  GLUCOSE 76      Radiology/Studies: No results found.  [id number] atrial flutter (personally reviewed)  TELEMETRY: atrial flutter, rates 90-110's (personally reviewed)  Assessment/Plan: 1.  Persistent atrial fibrillation/atypical atrial flutter The patient has persistent atrial arrhythmias in the setting of ICM and LAE.  Our ability to maintain SR is decreased long term. Would like to cardiovert, but he missed a dose of Eliquis Saturday night and is unaware of when he went back into flutter. For now, will increase amiodarone to 400mg  twice daily. Will try to schedule TEE/DCCV for tomorrow (currently schedule is full).  Continue Eliquis long term for CHADS2VASC of at least 6  2.  CAD/ICM No recent ischemic symptoms  Continue medical therapy  3.  Chronic systolic heart failure AHF team has been  consulted Appreciate their assistance    Signed, Chanetta Marshall 09/03/2017 4:28 PM   I have seen, examined the patient, and reviewed the above assessment and plan.  On exam, iRRR. 3/6 SEM LUSB which is late peaking,  2/6 SEM LLSB and apex.  Changes to above are made where necessary.  CHF team to evaluate and manage heart failure.  I worry that our ability to maintain sinus rhythm long term is low, particularly given structural heart disease.  He missed a dose of eliquis on Saturday.  I would therefore advise TEE followed by cardioversion when schedule will allow.  Will increase amiodarone to 400mg  BID in the interim. Not a candidate for ablation.  Very complicated patient.  He is quite ill and at increased risk for decompensation.  A high level of decision making was required for this encounter.  Co Sign: Thompson Grayer, MD 09/03/2017 4:37 PM

## 2017-09-03 NOTE — Patient Instructions (Addendum)
Medication Instructions:  None  Labwork: None  Testing/Procedures: None  Follow-Up: Your physician recommends that you schedule a follow-up appointment in: 4-6 weeks with Dr. Tamala Julian.    Any Other Special Instructions Will Be Listed Below (If Applicable).  Please report to [facility] ER to be admitted.    If you need a refill on your cardiac medications before your next appointment, please call your pharmacy.

## 2017-09-03 NOTE — Progress Notes (Signed)
CARDIOLOGY ADMISSION NOTE      Date: 09/03/2017  ID: Aaron Berg, DOB Oct 29, 1936, MRN [id number]  PCP: [name], MD  Cardiologist: Sinclair Grooms, MD     Chief Complaint  Patient presents with  . Coronary Artery Disease  . Congestive Heart Failure   History of Present Illness:  Aaron Berg is a 81 y.o. male a history of CAD s/p CABG in 2003, prior cath in 2012 with patent LIMA to LAD, patent SVG to RCA and 50% ostial stenosis of SVG to LCx and EF being 35% at that time. Progressive chronic combined systolic and diastolic heart failure with falling EF over the past 10 months, mild aortic stenosis/mitral regurg by echo in 09/2016, HTN, HLD, pAfib on eliquis, OSA on CPAP, COPD, microvascular dementia, CVA, and falls.  He came into the office today for routine follow-up. No particular complaints but upon check-in noted to have heart rate of 125 bpm. No chest discomfort, dyspnea, but feels somewhat fatigued. He did walk into the office from the parking deck. Over the past several weeks his blood pressures have been running relatively low. Over the past 6-12 months we have noticed an EF dropped from 35-40% to 15%. A recent dobutamine echo stress test excluded the possibility of normal/low pressure gradient severe aortic stenosis. The purpose of today's office visit to discuss results of the recent testing and overall prognosis. We have recently needed to withdrawal heart failure therapies because of hypotension. He physically looks better than his clinical exam. We did discuss resuscitation. He does not want heroic measures but if there are treatable cardiac entities that can sustain his independence he would be in favor of that particular therapy. He does not want prolonged artificial life support. Therefore we have decided that he is still a full code until we further evaluate the current situation.  The tachycardia on EKG today reveals a rate of 125 bpm with a QRS duration and polarity  similar to his baseline EKG. This suggests a supraventricular arrhythmia, possibly atrial tachycardia or slow atrial flutter. I do not believe this is sinus tachycardia.1      Past Medical History:  Diagnosis Date  . Aortic stenosis   . Arthritis    "thumbs, wrists, legs, spine" (05/12/2017)  . Atrial flutter ([place])   . Benign prostatic hyperplasia (BPH) with urinary urgency   . CAD (coronary artery disease)    a. s/p CABG 2003.  [name] Cerebrovascular disease 2006   multiple ischemic changes on prior MRI  . Chronic combined systolic and diastolic CHF (congestive heart failure) ([place])   . Chronic lower back pain   . Complication of anesthesia    "woke up too soon once; had 2nd stroke after right knee replacement"  . COPD (chronic obstructive pulmonary disease) ([place])    "no signs or tests"  . CVA (cerebral vascular accident) ([place]) late 1990s; 01/2014  . Dementia   . Depression   . Dry eyes   . Dyslipidemia    well controlled  . Gait disorder   . Heart murmur    "found in ~ 1946"  . Hypertension   . Hypertensive cardiovascular disease   . Male hypogonadism   . Migraine    "major one when I was a kid; very painful"  . Mitral regurgitation   . Multiple falls    "after both knees surgeries; went to rehab; came out and fell all the time; I'm much better now" (05/12/2017)  . Obesity   .  OSA on CPAP    cpap  . Paroxysmal atrial fibrillation (HCC)   . Skin cancer of anterior chest   . Spinal stenosis         Past Surgical History:  Procedure Laterality Date  . APPENDECTOMY  as child  . CARDIAC CATHETERIZATION  11/2003   Archie Endo 04/29/2011  . CARDIAC CATHETERIZATION N/A 12/03/2016   Procedure: Left Heart Cath and Cors/Grafts Angiography; Surgeon: Belva Crome, MD; Location: [facility]; Service: Cardiovascular; Laterality: N/A;  . CARDIOVERSION N/A 04/26/2014   Procedure: CARDIOVERSION; Surgeon: Sinclair Grooms, MD; Location: [facility]; Service: Cardiovascular; Laterality:  N/A;  . CARDIOVERSION N/A 05/24/2014   Procedure: CARDIOVERSION; Surgeon: Sinclair Grooms, MD; Location: [facility]; Service: Cardiovascular; Laterality: N/A;  . CATARACT EXTRACTION W/ INTRAOCULAR LENS IMPLANT, BILATERAL Bilateral 2000s  . CORONARY ANGIOPLASTY  1991   "no stents" (10/06/2016)  . CORONARY ARTERY BYPASS GRAFT  11/2002   X 3/notes 04/29/2011  . CYST EXCISION     lumbar; Dr. Joya Salm  . EYE SURGERY Right    detached retina  . JOINT REPLACEMENT    . MOHS SURGERY     on chest  . RETINAL DETACHMENT SURGERY Right   . SHOULDER SURGERY Right    with murphy, reattach ligaments  . TONSILLECTOMY  as child  . TOTAL KNEE ARTHROPLASTY Right 01/02/2014   Procedure: RIGHT TOTAL KNEE ARTHROPLASTY; Surgeon: Mauri Pole, MD; Location: [facility]; Service: Orthopedics; Laterality: Right;  . TOTAL KNEE ARTHROPLASTY Left 01/31/2014   Procedure: LEFT TOTAL KNEE ARTHROPLASTY; Surgeon: Mauri Pole, MD; Location: [facility]; Service: Orthopedics; Laterality: Left;  . TOTAL SHOULDER ARTHROPLASTY Right   . VIDEO ASSISTED THORACOSCOPY (VATS)/THOROCOTOMY  2003   benign nodule   Current Medications:        Outpatient Medications Prior to Visit  Medication Sig Dispense Refill  . amiodarone (PACERONE) 200 MG tablet Take 1 tablet (200 mg total) by mouth daily. 30 tablet 5  . Budesonide (RHINOCORT ALLERGY NA) Place 1 spray into the nose daily.    [name] donepezil (ARICEPT) 23 MG TABS tablet TAKE 1 TABLET AT BEDTIME 90 tablet 1  . DULoxetine (CYMBALTA) 30 MG capsule TAKE 1 CAPSULE BY MOUTH EVERY DAY 30 capsule 3  . ELIQUIS 5 MG TABS tablet TAKE 1 TABLET TWICE A DAY 180 tablet 3  . feeding supplement, ENSURE ENLIVE, (ENSURE ENLIVE) LIQD Take 237 mLs by mouth 2 (two) times daily between meals. 237 mL 12  . finasteride (PROSCAR) 5 MG tablet Take 1 tablet (5 mg total) by mouth daily. 90 tablet 3  . folic acid (FOLVITE) 1 MG tablet Take 1 tablet (1 mg total) by mouth daily. 30 tablet 1  . furosemide (LASIX) 80  MG tablet Take 1.5 tablets (120mg ) by mouth in the morning. Take 1 tablet (80mg ) by mouth in the evening. 225 tablet 3  . KLOR-CON M20 20 MEQ tablet Take 1 tablet (20 mEq total) by mouth 2 (two) times daily. 180 tablet 3  . Multiple Vitamins-Minerals (MULTIVITAMIN WITH MINERALS) tablet Take 1 tablet by mouth daily. Reported on 01/30/2016    . nitroGLYCERIN (NITROSTAT) 0.4 MG SL tablet Place 1 tablet (0.4 mg total) under the tongue every 5 (five) minutes as needed for chest pain. 25 tablet 12  . polyethylene glycol (MIRALAX / GLYCOLAX) packet Take 17 g by mouth daily.    . rosuvastatin (CRESTOR) 40 MG tablet Take 1 tablet (40 mg total) by mouth daily. Please keep 06/29/17 appt  for future refills. 90 tablet 3  . sodium chloride (OCEAN) 0.65 % SOLN nasal spray Place 1 spray into both nostrils daily as needed for congestion.     . Testosterone 20.25 MG/1.25GM (1.62%) GEL Apply 2 Squirts topically daily as needed (occasionally). One pump per shoulder.    . traMADol (ULTRAM) 50 MG tablet TAKE 1 TABLET BY MOUTH EVERY 8 HOURS AS NEEDED 50 tablet 0   No facility-administered medications prior to visit.    Allergies: Procardia [nifedipine]  Social History        Social History  . Marital status: Divorced    Spouse name: N/A  . Number of children: 2  . Years of education: Masters        Occupational History  .      retired         Social History Main Topics  . Smoking status: Former Smoker    Packs/day: 2.00    Years: 33.00    Types: Cigarettes    Quit date: 06/17/1989  . Smokeless tobacco: Never Used  . Alcohol use 4.8 oz/week    8 Shots of liquor per week     Comment: 05/12/2017 "bourbon" (when he has it--ran out 05/2017)  . Drug use: No  . Sexual activity: Not Currently       Other Topics Concern  . None      Social History Narrative   Patient is single and lives alone.   Patient is retired.   Patient has a Scientist, water quality.   Patient has two adult children.   Patient is  right-handed.   Patient drinks one cup of coffee daily.      Lives in [employer] community   Retired Engineer, civil (consulting)   Family History: The patient's family history includes Arthritis in his sister; CAD in his father and mother; Healthy in his sister; Pancreatic cancer in his father.  ROS:  Please see the history of present illness.  Tremor, poor appetite, weakness, dyspnea on exertion, exertional fatigue.  All other systems reviewed and are negative.  PHYSICAL EXAM:   VS: BP 96/70 (BP Location: Right Arm)  Pulse (!) 125  Ht 5\' 8"  (1.727 m)  Wt 176 lb 6.4 oz (80 kg)  BMI 26.82 kg/m  GEN: Well nourished, well developed, in no acute distress Hands and feet are cold.  HEENT: normal  Neck: no JVD, carotid bruits, or masses  Cardiac: RRR; rubs, or gallops,no edema . Left parasternal systolic murmur is noted. No diastolic murmur.  Respiratory: clear to auscultation bilaterally, normal work of breathing  GI: soft, nontender, nondistended, + BS.  MS: no deformity or atrophy . Lower extremities are cyanotic and livedo reticularis is noted around the knees and in the thigh area.  Skin: warm and dry, no rash  Neuro: Alert and Oriented x 3, Strength and sensation are intact  Psych: euthymic mood, full affect     Wt Readings from Last 3 Encounters:  09/03/17 176 lb 6.4 oz (80 kg)  08/27/17 183 lb 6.8 oz (83.2 kg)  08/04/17 183 lb 12.8 oz (83.4 kg)   Studies/Labs Reviewed:  EKG: EKG Wide complex tachycardia with visible P waves at approximately 125-130 bpm. QRS polarity is similar to baseline EKG. There is left axis deviation with QRS appearance of the left bundle branch block.  Recent Labs:  05/12/2017: B Natriuretic Peptide 1,703.1; TSH 1.397  05/14/2017: Magnesium 1.9  05/21/2017: ALT 13  06/23/2017: Hemoglobin 14.2; Platelets 167  08/04/2017: BUN 46; Creatinine, Ser  1.49; NT-Pro BNP 5,754; Potassium 4.2; Sodium 141  Lipid Panel  Labs (Brief)                                                      Additional studies/ records that were reviewed today include:  December 2017 nuclear stress test:  Study Highlights  Nuclear stress EF: 41%.  There was no ST segment deviation noted during stress.  There is a medium defect of moderate severity present in the basal inferior, mid inferior, apical lateral and apex location. The defect is non-reversible and consistent with prior scar of diaphragmatic attenuation artifact.  This is a high risk study.  The left ventricular ejection fraction is moderately decreased (30-44%).  Cardiac catheterization December 2017:  Coronary Diagrams  Diagnostic Diagram     Echocardiogram July 2018:  Study Conclusions  - Left ventricle: The cavity size was moderately dilated. Wall  thickness was normal. Systolic function was severely reduced. The  estimated ejection fraction was in the range of 15% to 20%.  Severe diffuse hypokinesis. Doppler parameters are consistent  with restrictive physiology, indicative of decreased left  ventricular diastolic compliance and/or increased left atrial  pressure. No evidence of thrombus. Acoustic contrast  opacification revealed no evidence ofthrombus.  - Aortic valve: Valve mobility was moderately restricted.  Transvalvular velocity was increased less than expected, due to  low cardiac output. There was moderate stenosis. Valve area  (VTI): 1.13 cm^2. Valve area (Vmax): 1.3 cm^2. Valve area  (Vmean): 1.29 cm^2.  - Mitral valve: Calcified annulus. Mildly thickened leaflets .  There was mild to moderate regurgitation directed centrally.  - Left atrium: The atrium was moderately to severely dilated.  - Right ventricle: The cavity size was mildly dilated. Systolic  function was moderately reduced.  - Right atrium: The atrium was mildly dilated.  - Pulmonary arteries: Systolic pressure was mildly increased. PA  peak pressure: 35 mm Hg (S).  Impressions:  - Compared to 10/08/2016, left ventricular  systolic function has  decreased markedly and aortic gradients are lower. Suspect aortic  stenosis is only moderate, but dobutamine echo may be useful to  evaluate for low gradient severe aortic stenosis.  Dobutamine stress echo September 2018:  Impressions:  - Baseline: Peak velocity 2.6 m/sec, mean gradient 14 mmHg, peak 27  mmHg AVA 1.4 cm2 SV = 73 cc  Peak Dobutamine Peak velocity 3.1 m/sec mean gradient 20 mmHg  peak 39 mmHg AVA 1.2 cm2 SV = 84 cc  Overall would consider the patient to have poor ventricular  reserve with SV only increasing only 15% with peak  Dobutamine and Aortic Stenosis appearing moderate  ASSESSMENT:    1. Wide-complex tachycardia ([place])   2. Chronic combined systolic and diastolic CHF (congestive heart failure) ([place])   3. Essential hypertension, benign   4. Paroxysmal atrial fibrillation (HCC)   5. Coronary artery disease involving coronary bypass graft of native heart with angina pectoris ([place])   6. Nonrheumatic aortic valve stenosis   7. OSA (obstructive sleep apnea)   8. Chronic anticoagulation   9. Hyperlipidemia with target LDL less than 70    PLAN:  In order of problems listed above:  1. There is clear evidence that atrial activity is driving the current tachycardia and the polarity of the patient's QRS complex is very similar to baseline. I  don't believe this is sinus tachycardia. Rule out atrial tachycardia or atrial flutter with slowed flutter cycle length. Given EF of 15% documented recently, I'm admitting him to the hospital to help resolve the tachycardia. We'll ask EP to see the patient. Continue amiodarone. Continue anticoagulation. 2. Progressive decline in LV systolic function with above workup as noted and with recent progressive decrease in blood pressure requiring that ACE/ARB therapy be discontinued. We will likely need to have an advanced heart failure therapy consult. If prognosis is bleak, I will speak to the patient about palliative care.  He has expressed a desire not to have prolonged life support. He has not reversed to CPR if we have reasonable treatment strategy to maintain independent living. 3. Relatively low blood pressures recently have led to reduction and ultimately discontinuation of ARB and long-acting nitrate therapy. 4. Prior history of both paroxysmal atrial fibrillation and atrial flutter leading to chronic amiodarone therapy for rhythm control. 5. Currently has no anginal complaints and very little dyspnea. Angiography within the last 12 months revealed patent bypass grafts. 6. He has moderate aortic stenosis recently documented as such by dobutamine echo. 7. Managed with CPap 8. Continue current anticoagulation strategy The patient is a being admitted to the hospital because of uncontrolled supraventricular tachycardia in the setting of severe left ventricular systolic function. The tachycardia appears to be a supraventricular rhythm disturbance. Cannot totally exclude the possibility of a ventricular dysrhythmia. All have both EP and advanced heart failure service to see.   Signed, Sinclair Grooms, MD  09/03/2017 11:40 AM    [facility] [address], [address], [place]  [address] Phone: [phone]; Fax: [phone]

## 2017-09-04 ENCOUNTER — Encounter (HOSPITAL_COMMUNITY): Admission: EM | Disposition: A | Discharge status: Home / Self Care | Payer: Self-pay | Source: Home / Self Care

## 2017-09-04 ENCOUNTER — Inpatient Hospital Stay (HOSPITAL_COMMUNITY): Payer: Medicare Other | Admitting: Anesthesiology

## 2017-09-04 ENCOUNTER — Encounter (HOSPITAL_COMMUNITY): Payer: Self-pay | Admitting: Emergency Medicine

## 2017-09-04 ENCOUNTER — Other Ambulatory Visit: Payer: Self-pay

## 2017-09-04 ENCOUNTER — Inpatient Hospital Stay (HOSPITAL_COMMUNITY): Payer: Medicare Other

## 2017-09-04 ENCOUNTER — Inpatient Hospital Stay (HOSPITAL_BASED_OUTPATIENT_CLINIC_OR_DEPARTMENT_OTHER): Payer: Medicare Other

## 2017-09-04 DIAGNOSIS — I4892 Unspecified atrial flutter: Secondary | ICD-10-CM | POA: Diagnosis not present

## 2017-09-04 DIAGNOSIS — N184 Chronic kidney disease, stage 4 (severe): Secondary | ICD-10-CM | POA: Diagnosis not present

## 2017-09-04 DIAGNOSIS — G4733 Obstructive sleep apnea (adult) (pediatric): Secondary | ICD-10-CM | POA: Diagnosis not present

## 2017-09-04 DIAGNOSIS — I483 Typical atrial flutter: Secondary | ICD-10-CM

## 2017-09-04 DIAGNOSIS — F0390 Unspecified dementia without behavioral disturbance: Secondary | ICD-10-CM | POA: Diagnosis not present

## 2017-09-04 DIAGNOSIS — R000 Tachycardia, unspecified: Secondary | ICD-10-CM | POA: Diagnosis present

## 2017-09-04 DIAGNOSIS — I350 Nonrheumatic aortic (valve) stenosis: Secondary | ICD-10-CM | POA: Diagnosis not present

## 2017-09-04 DIAGNOSIS — I5042 Chronic combined systolic (congestive) and diastolic (congestive) heart failure: Secondary | ICD-10-CM | POA: Diagnosis not present

## 2017-09-04 DIAGNOSIS — I25709 Atherosclerosis of coronary artery bypass graft(s), unspecified, with unspecified angina pectoris: Secondary | ICD-10-CM | POA: Diagnosis not present

## 2017-09-04 DIAGNOSIS — I10 Essential (primary) hypertension: Secondary | ICD-10-CM | POA: Diagnosis not present

## 2017-09-04 DIAGNOSIS — E785 Hyperlipidemia, unspecified: Secondary | ICD-10-CM | POA: Diagnosis not present

## 2017-09-04 DIAGNOSIS — I509 Heart failure, unspecified: Secondary | ICD-10-CM | POA: Diagnosis not present

## 2017-09-04 DIAGNOSIS — I130 Hypertensive heart and chronic kidney disease with heart failure and stage 1 through stage 4 chronic kidney disease, or unspecified chronic kidney disease: Secondary | ICD-10-CM | POA: Diagnosis not present

## 2017-09-04 DIAGNOSIS — Z8673 Personal history of transient ischemic attack (TIA), and cerebral infarction without residual deficits: Secondary | ICD-10-CM | POA: Diagnosis not present

## 2017-09-04 DIAGNOSIS — I484 Atypical atrial flutter: Secondary | ICD-10-CM | POA: Diagnosis not present

## 2017-09-04 DIAGNOSIS — I700 Atherosclerosis of aorta: Secondary | ICD-10-CM | POA: Diagnosis not present

## 2017-09-04 DIAGNOSIS — I480 Paroxysmal atrial fibrillation: Secondary | ICD-10-CM | POA: Diagnosis not present

## 2017-09-04 DIAGNOSIS — I5023 Acute on chronic systolic (congestive) heart failure: Secondary | ICD-10-CM | POA: Diagnosis not present

## 2017-09-04 DIAGNOSIS — I481 Persistent atrial fibrillation: Secondary | ICD-10-CM | POA: Diagnosis not present

## 2017-09-04 DIAGNOSIS — I5043 Acute on chronic combined systolic (congestive) and diastolic (congestive) heart failure: Secondary | ICD-10-CM | POA: Diagnosis not present

## 2017-09-04 DIAGNOSIS — I081 Rheumatic disorders of both mitral and tricuspid valves: Secondary | ICD-10-CM | POA: Diagnosis not present

## 2017-09-04 HISTORY — PX: TEE WITHOUT CARDIOVERSION: SHX5443

## 2017-09-04 HISTORY — PX: CARDIOVERSION: SHX1299

## 2017-09-04 LAB — BASIC METABOLIC PANEL
ANION GAP: 8 (ref 5–15)
BUN: 46 mg/dL — ABNORMAL HIGH (ref 6–20)
CALCIUM: 10 mg/dL (ref 8.9–10.3)
CHLORIDE: 99 mmol/L — AB (ref 101–111)
CO2: 32 mmol/L (ref 22–32)
CREATININE: 1.9 mg/dL — AB (ref 0.61–1.24)
GFR calc Af Amer: 37 mL/min — ABNORMAL LOW (ref >60)
GFR calc non Af Amer: 32 mL/min — ABNORMAL LOW (ref >60)
GLUCOSE: 95 mg/dL (ref 65–99)
Potassium: 4.1 mmol/L (ref 3.5–5.1)
Sodium: 139 mmol/L (ref 135–145)

## 2017-09-04 LAB — MRSA PCR SCREENING: MRSA BY PCR: NEGATIVE

## 2017-09-04 SURGERY — ECHOCARDIOGRAM, TRANSESOPHAGEAL
Anesthesia: Monitor Anesthesia Care

## 2017-09-04 MED ORDER — FUROSEMIDE 80 MG PO TABS: 80.0000 mg | ORAL_TABLET | Freq: Every day | ORAL | Status: DC

## 2017-09-04 MED ORDER — PROPOFOL 500 MG/50ML IV EMUL
INTRAVENOUS | Status: DC | PRN
  Administered 2017-09-04: 100 ug/kg/min via INTRAVENOUS

## 2017-09-04 MED ORDER — PHENYLEPHRINE HCL 10 MG/ML IJ SOLN
INTRAMUSCULAR | Status: DC | PRN
  Administered 2017-09-04 (×3): 40 ug via INTRAVENOUS

## 2017-09-04 MED ORDER — SODIUM CHLORIDE 0.9 % IV SOLN
INTRAVENOUS | Status: DC | PRN
Start: 2017-09-04 — End: 2017-09-04
  Administered 2017-09-04: 11:00:00 via INTRAVENOUS

## 2017-09-04 MED ORDER — TRAMADOL HCL 50 MG PO TABS
50.0000 mg | ORAL_TABLET | Freq: Three times a day (TID) | ORAL | Status: DC | PRN
  Administered 2017-09-04: 50 mg via ORAL
  Filled 2017-09-04 (×2): qty 1

## 2017-09-04 MED ORDER — LIDOCAINE HCL (CARDIAC) 20 MG/ML IV SOLN
INTRAVENOUS | Status: DC | PRN
  Administered 2017-09-04: 80 mg via INTRAVENOUS

## 2017-09-04 MED ORDER — ENSURE ENLIVE PO LIQD: 237.0000 mL | Freq: Two times a day (BID) | ORAL | Status: DC

## 2017-09-04 MED ORDER — AMIODARONE HCL 200 MG PO TABS
400.0000 mg | ORAL_TABLET | Freq: Two times a day (BID) | ORAL | Status: DC
  Administered 2017-09-04: 400 mg via ORAL
  Filled 2017-09-04: qty 2

## 2017-09-04 MED ORDER — AMIODARONE HCL 400 MG PO TABS: ORAL_TABLET | ORAL | 1 refills | Status: DC

## 2017-09-04 MED ORDER — BUTAMBEN-TETRACAINE-BENZOCAINE 2-2-14 % EX AERO
INHALATION_SPRAY | CUTANEOUS | Status: DC | PRN
  Administered 2017-09-04: 2 via TOPICAL

## 2017-09-04 MED ORDER — FUROSEMIDE 80 MG PO TABS: 120.0000 mg | ORAL_TABLET | Freq: Every morning | ORAL | Status: DC

## 2017-09-04 MED ORDER — SODIUM CHLORIDE 0.9 % IV SOLN: INTRAVENOUS | Status: DC

## 2017-09-04 NOTE — Care Management Obs Status (Signed)
[facility] NOTIFICATION   Patient Details  Name: Aaron Berg MRN: [id number] Date of Birth: [birth year]   Medicare Observation Status Notification Given:  Yes    Maryclare Labrador, RN 09/04/2017, 1:40 PM

## 2017-09-04 NOTE — Progress Notes (Signed)
PT refused CPAP RT will continue to monitor

## 2017-09-04 NOTE — Progress Notes (Signed)
Patient to discharge home unable to print AVS due to order reconciliation needing to be completed.  NP Shieler paged but no longer on service at this time.  RN paged on call coverage, awaiting response.

## 2017-09-04 NOTE — H&P (View-Only) (Signed)
Advanced Heart Failure Rounding Note  PCP: Dr. Redmond School Primary Cardiologist: Dr. Tamala Julian  Subjective:    Stable this am. Remains in [place]. Denies CP/SOB. Plan for TEE/DCCV this am.   Creatinine relatively stable at 1.9 from 1.8 yesterday. K 4.1  Objective:   Weight Range: 177 lb 7.5 oz (80.5 kg) Body mass index is 26.98 kg/m.   Vital Signs:   Temp:  [97.5 F (36.4 C)-98 F (36.7 C)] 97.5 F (36.4 C) (09/21 0800) Pulse Rate:  [37-104] 74 (09/21 0832) Resp:  [14-29] 20 (09/21 0832) BP: (79-103)/(56-79) 82/66 (09/21 0832) SpO2:  [85 %-100 %] 93 % (09/21 0832) Weight:  [177 lb 7.5 oz (80.5 kg)] 177 lb 7.5 oz (80.5 kg) (09/20 2118) Last BM Date: 09/02/17  Weight change: Filed Weights   09/03/17 2118  Weight: 177 lb 7.5 oz (80.5 kg)    Intake/Output:   Intake/Output Summary (Last 24 hours) at 09/04/17 0955 Last data filed at 09/04/17 4481  Gross per 24 hour  Intake              240 ml  Output             1050 ml  Net             -810 ml      Physical Exam    General:  Elderly and chronically ill appearing. No resp difficulty HEENT: Normal Neck: Supple. JVP 6-7. Carotids 2+ bilat; no bruits. No lymphadenopathy or thyromegaly appreciated. Cor: PMI nondisplaced. Irregularly irregular. 2/6 Aaron/TR. 2/6 AS, S2 mildly diminished.  Lungs: Clear anteriorly. Abdomen: Soft, nontender, nondistended. No hepatosplenomegaly. No bruits or masses. Good bowel sounds. Extremities: No cyanosis, clubbing, or rash. Trace to 1+ ankle edema.  Neuro: Alert & orientedx3, cranial nerves grossly intact. moves all 4 extremities w/o difficulty. Affect pleasant   Telemetry   Aflutter in 70-80s with PVCs, Personally reviewed  EKG    Aflutter on admit.   Labs    CBC  Recent Labs  09/03/17 1318  WBC 6.9  NEUTROABS 5.2  HGB 13.9  HCT 42.0  MCV 91.5  PLT 856*   Basic Metabolic Panel  Recent Labs  09/03/17 1318 09/04/17 0227  NA 135 139  K 3.7 4.1  CL 96* 99*  CO2  25 32  GLUCOSE 76 95  BUN 50* 46*  CREATININE 1.84* 1.90*  CALCIUM 9.9 10.0  MG 2.5*  --    Liver Function Tests  Recent Labs  09/03/17 1318  AST 28  ALT 17  ALKPHOS 51  BILITOT 0.9  PROT 6.7  ALBUMIN 3.6   No results for input(s): LIPASE, AMYLASE in the last 72 hours. Cardiac Enzymes No results for input(s): CKTOTAL, CKMB, CKMBINDEX, TROPONINI in the last 72 hours.  BNP: BNP (last 3 results)  Recent Labs  05/12/17 1938 09/03/17 1318  BNP 1,703.1* 567.6*    ProBNP (last 3 results)  Recent Labs  06/29/17 1551 07/22/17 1158 08/04/17 1611  PROBNP 6,585* 6,747* 5,754*     D-Dimer No results for input(s): DDIMER in the last 72 hours. Hemoglobin A1C No results for input(s): HGBA1C in the last 72 hours. Fasting Lipid Panel No results for input(s): CHOL, HDL, LDLCALC, TRIG, CHOLHDL, LDLDIRECT in the last 72 hours. Thyroid Function Tests  Recent Labs  09/03/17 1318  TSH 2.319    Other results:   Imaging    Dg Chest 2 View  Result Date: 09/04/2017 CLINICAL DATA:  Tachycardia. EXAM: CHEST  2 VIEW  COMPARISON:  CT 07/13/2017. FINDINGS: Prior CABG. Cardiomegaly with mild pulmonary vascular prominence and basilar interstitial prominence. Tiny pleural effusions cannot be excluded. Findings consist with mild CHF. Old right rib fractures. Right shoulder replacement. IMPRESSION: Congestive heart failure with pulmonary interstitial edema. Electronically Signed   By: Marcello Moores  Register   On: 09/04/2017 07:51      Medications:     Scheduled Medications: . amiodarone  400 mg Oral BID  . apixaban  5 mg Oral BID  . donepezil  10 mg Oral QHS  . DULoxetine  30 mg Oral QHS  . finasteride  5 mg Oral Daily  . furosemide  120 mg Oral q morning - 10a  . furosemide  80 mg Oral Daily  . potassium chloride  20 mEq Oral BID  . rosuvastatin  40 mg Oral q1800  . sodium chloride flush  3 mL Intravenous Q12H     Infusions: . sodium chloride       PRN  Medications:  sodium chloride, acetaminophen, ondansetron (ZOFRAN) IV, sodium chloride flush, traMADol    Patient Profile   Aaron Berg is a 81 year old with a history of CAD S/P CABG 2003, chronic combined systolic/diastoic heart failure, HTN, hyperlipidemia, OSA on CPAP, dementia, CVA, and falls.  Sent to the ED from Dr Darliss Ridgel office with A flutter. VT. EP and HF consulted.    Assessment/Plan   1. Aypical Flutter -  EP consulted.  Unable to cariovert on admit because he missed eliquis on Saturday.  - Plan for TEE/DCCV this am.  2. Acute on chronic combined Systolic/Diastolic Heart Failure - ICM ECHO 08/2017 EF ~20%.  - Volume status stable on exam.  - Continue lasix 80 mg daily.  - No bb/ace wih hypotension  3. PAF  - Continue amiodarone 400 mg BID.  - Continue eliquis 2.5 mg BID. No bleeding noted. 4. Aortic Stenosis  - Moderate on echo. TEE today.  5. CKD Stage IV - Creatinine 1.9 this am.   Length of Stay: 1  Shirley Friar, PA-C  09/04/2017, 9:55 AM  Advanced Heart Failure Team Pager [phone] (M-F; 7a - 4p)  Please contact [facility] for night-coverage after hours (4p -7a ) and weekends on [email or web address]  Patient seen and examined with the above-signed Advanced Practice Provider and/or Housestaff. I personally reviewed laboratory data, imaging studies and relevant notes. I independently examined the patient and formulated the important aspects of the plan. I have edited the note to reflect any of my changes or salient points. I have personally discussed the plan with the patient and/or family.  Long talk about situation. Volume status ok but has progressive HF worsening over past 6+ months. Will proceed with TEE/DC-CV today. Hopefully can go home later this afternoon if stable. Will need f/u in HF Clinic in next 1-2 weeks to discuss if there are any viable advanced HF options or do we need to involve Palliative Care.  I discussed these issues with him and his 2  daughters who were at bedside.   Aaron Bickers, MD  10:19 AM

## 2017-09-04 NOTE — Care Management Note (Signed)
Case Management Note  Patient Details  Name: Aaron Berg MRN: [id number] Date of Birth: 02-27-1936  Subjective/Objective:      Pt admitted with A fib-  Now s/p Cardioversion               Action/Plan:   PTA independent from home- stays with daughter.  Pt ambulated independently in room therefore PT nor OT eval ordered.  Pt educated on the importance of daily weights and low sodium diet.  Pt has PCP and denied barriers with obtaining/paying for medications   Expected Discharge Date:  09/04/17               Expected Discharge Plan:  Home/Self Care  In-House Referral:     Discharge planning Services  CM Consult  Post Acute Care Choice:    Choice offered to:     DME Arranged:    DME Agency:     HH Arranged:    HH Agency:     Status of Service:  Completed, signed off  If discussed at [employer] of Stay Meetings, dates discussed:    Additional Comments:  Maryclare Labrador, RN 09/04/2017, 1:41 PM

## 2017-09-04 NOTE — Care Management CC44 (Signed)
Condition Code 44 Documentation Completed  Patient Details  Name: Aaron Berg MRN: [id number] Date of Birth: 1936-03-04   Condition Code 44 given:  Yes Patient signature on Condition Code 44 notice:  Yes Documentation of 2 MD's agreement:  Yes Code 44 added to claim:  Yes    Maryclare Labrador, RN 09/04/2017, 1:40 PM

## 2017-09-04 NOTE — Anesthesia Preprocedure Evaluation (Signed)
Anesthesia Evaluation  Patient identified by MRN, date of birth, ID band Patient awake    Reviewed: Allergy & Precautions, H&P , NPO status , Patient's Chart, lab work & pertinent test results  Airway Mallampati: II   Neck ROM: full    Dental   Pulmonary sleep apnea , COPD, former smoker,    breath sounds clear to auscultation       Cardiovascular hypertension, + angina + CAD and +CHF  + dysrhythmias  Rhythm:irregular Rate:Normal     Neuro/Psych  Headaches, Depression CVA    GI/Hepatic   Endo/Other    Renal/GU      Musculoskeletal  (+) Arthritis ,   Abdominal   Peds  Hematology   Anesthesia Other Findings   Reproductive/Obstetrics                             Anesthesia Physical Anesthesia Plan  ASA: III  Anesthesia Plan: MAC   Post-op Pain Management:    Induction: Intravenous  PONV Risk Score and Plan: 1 and Ondansetron, Dexamethasone, Propofol infusion and Treatment may vary due to age or medical condition  Airway Management Planned: Nasal Cannula  Additional Equipment:   Intra-op Plan:   Post-operative Plan:   Informed Consent: I have reviewed the patients History and Physical, chart, labs and discussed the procedure including the risks, benefits and alternatives for the proposed anesthesia with the patient or authorized representative who has indicated his/her understanding and acceptance.     Plan Discussed with: CRNA and Anesthesiologist  Anesthesia Plan Comments:         Anesthesia Quick Evaluation

## 2017-09-04 NOTE — Interval H&P Note (Signed)
History and Physical Interval Note:  09/04/2017 10:25 AM  Aaron Berg  has presented today for surgery, with the diagnosis of a fib  The various methods of treatment have been discussed with the patient and family. After consideration of risks, benefits and other options for treatment, the patient has consented to  Procedure(s): TRANSESOPHAGEAL ECHOCARDIOGRAM (TEE) (N/A) CARDIOVERSION (N/A) as a surgical intervention .  The patient's history has been reviewed, patient examined, no change in status, stable for surgery.  I have reviewed the patient's chart and labs.  Questions were answered to the patient's satisfaction.     Tarini Carrier, Quillian Quince

## 2017-09-04 NOTE — Progress Notes (Signed)
Discharge instructions given to patient and patients daughter, all questions answered at this time.  Pt. VSS with no s/s of distress noted.  Patient stable at discharge.

## 2017-09-04 NOTE — Progress Notes (Signed)
Initial Nutrition Assessment  DOCUMENTATION CODES:   Not applicable  INTERVENTION:   -Ensure Enlive po BID, each supplement provides 350 kcal and 20 grams of protein  NUTRITION DIAGNOSIS:   Predicted suboptimal nutrient intake related to poor appetite, chronic illness as evidenced by percent weight loss.  GOAL:   Patient will meet greater than or equal to 90% of their needs  MONITOR:   PO intake, Supplement acceptance, Labs, Weight trends, Skin, I & O's  REASON FOR ASSESSMENT:   Malnutrition Screening Tool    ASSESSMENT:   Aaron Berg is a 81 y.o. male with a history of ICM, CAD s/p CABG, CHF who is being seen today for the evaluation of atypical atrial flutter   Pt admitted with a-fib and a-flutter.   Pt unavailable at time of visit (down for procedure- cardioversion). Unable to obtain further hx or complete nutrition-focused physical exam.   No meal completion data available to assess, however, pt reported decreased appetite PTA.   Reviewed wt hx, noted a 11.3% wt loss over the past 3 months, which is significant for time frame.   Per MD notes, potential d/c to home later today if pt able to ambulate and BP acceptable after ambulation.   Medications reviewed and include lasix and KCl.   Labs reviewed: Mg: 2.5.  Diet Order:  Diet - low sodium heart healthy Diet 2 gram sodium Room service appropriate? Yes; Fluid consistency: Thin  Skin:  Reviewed, no issues  Last BM:  09/02/17  Height:   Ht Readings from Last 1 Encounters:  09/04/17 5\' 8"  (1.727 m)    Weight:   Wt Readings from Last 1 Encounters:  09/04/17 172 lb (78 kg)    Ideal Body Weight:  70 kg  BMI:  Body mass index is 26.15 kg/m.  Estimated Nutritional Needs:   Kcal:  1650-1850  Protein:  80-95 grams  Fluid:  1.6-1.8 L  EDUCATION NEEDS:   Education needs no appropriate at this time  Garwood Wentzell A. Jimmye Norman, RD, LDN, CDE Pager: [phone] After hours Pager: [phone]

## 2017-09-04 NOTE — Progress Notes (Signed)
Patient up to bathroom and ambulated 200 ft in hall with walker. Tolerated well. Patient blood pressure after ambulating 95/80. Patient not in distress, no complaints of pain or SOB. Will continue to assess.  Clyde Canterbury, RN

## 2017-09-04 NOTE — Progress Notes (Signed)
Electrophysiology Rounding Note  Patient Name: Aaron Berg Date of Encounter: 09/04/2017  Primary Cardiologist: Tamala Julian Electrophysiologist: Kseniya Grunden (new this admission)   Subjective   The patient is stable today. He currently denies chest pain or shortness of breath at rest.   Inpatient Medications    Scheduled Meds: . amiodarone  400 mg Oral BID  . apixaban  5 mg Oral BID  . donepezil  10 mg Oral QHS  . DULoxetine  30 mg Oral QHS  . finasteride  5 mg Oral Daily  . furosemide  120 mg Oral q morning - 10a  . furosemide  80 mg Oral Daily  . potassium chloride  20 mEq Oral BID  . rosuvastatin  40 mg Oral q1800  . sodium chloride flush  3 mL Intravenous Q12H   Continuous Infusions: . sodium chloride     PRN Meds: sodium chloride, acetaminophen, ondansetron (ZOFRAN) IV, sodium chloride flush   Vital Signs    Vitals:   09/04/17 0135 09/04/17 0343 09/04/17 0800 09/04/17 0832  BP: (!) 83/71 (!) 81/66  (!) 82/66  Pulse: 72 77  74  Resp:  15  20  Temp:  97.9 F (36.6 C) (!) 97.5 F (36.4 C)   TempSrc:  Oral Oral   SpO2: 97% 95%  93%  Weight:      Height:        Intake/Output Summary (Last 24 hours) at 09/04/17 0845 Last data filed at 09/04/17 3086  Gross per 24 hour  Intake              240 ml  Output             1050 ml  Net             -810 ml   Filed Weights   09/03/17 2118  Weight: 177 lb 7.5 oz (80.5 kg)    Physical Exam    GEN- The patient is elderly appearing, alert and oriented x 3 today.   Head- normocephalic, atraumatic Eyes-  Sclera clear, conjunctiva pink Ears- hearing intact Oropharynx- clear Neck- supple Lungs- Clear to ausculation bilaterally, normal work of breathing Heart- Regular rate and rhythm  GI- soft, NT, ND, + BS Extremities- no clubbing, cyanosis, or edema Skin- no rash or lesion Psych- euthymic mood, full affect Neuro- strength and sensation are intact  Labs    CBC  Recent Labs  09/03/17 1318  WBC 6.9  NEUTROABS  5.2  HGB 13.9  HCT 42.0  MCV 91.5  PLT 578*   Basic Metabolic Panel  Recent Labs  09/03/17 1318 09/04/17 0227  NA 135 139  K 3.7 4.1  CL 96* 99*  CO2 25 32  GLUCOSE 76 95  BUN 50* 46*  CREATININE 1.84* 1.90*  CALCIUM 9.9 10.0  MG 2.5*  --    Liver Function Tests  Recent Labs  09/03/17 1318  AST 28  ALT 17  ALKPHOS 51  BILITOT 0.9  PROT 6.7  ALBUMIN 3.6   Thyroid Function Tests  Recent Labs  09/03/17 1318  TSH 2.319    Telemetry    Atrial flutter (likely typical), rate controlled  (personally reviewed)  Radiology    Dg Chest 2 View  Result Date: 09/04/2017 CLINICAL DATA:  Tachycardia. EXAM: CHEST  2 VIEW COMPARISON:  CT 07/13/2017. FINDINGS: Prior CABG. Cardiomegaly with mild pulmonary vascular prominence and basilar interstitial prominence. Tiny pleural effusions cannot be excluded. Findings consist with mild CHF. Old right rib fractures. Right  shoulder replacement. IMPRESSION: Congestive heart failure with pulmonary interstitial edema. Electronically Signed   By: Marcello Moores  Register   On: 09/04/2017 07:51     Patient Profile     Aaron Berg is a 81 y.o. male with a past medical history significant for ICM, CAD s/p CABG, AS, and HF.  He was admitted for atypical atrial flutter.   Assessment & Plan    1.  Persistent atrial fibrillation/atypical atrial flutter The patient has persistent atrial arrhythmias in the setting of ICM and LAE.  Our ability to maintain SR is decreased long term.  Continue increased dose of amiodarone at 400mg  twice daily.  TEE/DCCV trying to be arranged for today - he missed a dose of Eliquis Saturday night  Continue Eliquis long term for CHADS2VASC of at least 6  2.  CAD/ICM No recent ischemic symptoms  Continue medical therapy  3.  Chronic systolic heart failure AHF team has been consulted Appreciate their assistance    Signed, Chanetta Marshall, NP  09/04/2017, 8:45 AM   I have seen, examined the patient, and  reviewed the above assessment and plan.  On exam, iRRR.  3/6 SEM LUSB which is lat peaking, 2/6 SEM at the apex. Changes to above are made where necessary.  Would plan TEE guided cardioversion today if schedule allows.  If schedule does not allow, would continue to load with oral amiodarone over the weekend and diurese as able and then proceed early next week. Continue anticoagulation without interruption.  Amiodarone is increased to 400mg  BID.  Currently atrial is typical appearing however patient also has afib and significant comorbidities of severe LV dysfunction and at least moderate valvular heart disease.  He is not a candidate for ablation at this time.  Co Sign: Thompson Grayer, MD 09/04/2017 9:47 AM

## 2017-09-04 NOTE — CV Procedure (Signed)
   TRANSESOPHAGEAL ECHOCARDIOGRAM GUIDED DIRECT CURRENT CARDIOVERSION  NAME:  Aaron Berg   MRN: [id number] DOB:  10-Oct-1936   ADMIT DATE: 09/03/2017  INDICATIONS:  Atrial flutter   PROCEDURE:   Informed consent was obtained prior to the procedure. The risks, benefits and alternatives for the procedure were discussed and the patient comprehended these risks.  Risks include, but are not limited to, cough, sore throat, vomiting, nausea, somnolence, esophageal and stomach trauma or perforation, bleeding, low blood pressure, aspiration, pneumonia, infection, trauma to the teeth and death.    After a procedural time-out, the oropharynx was anesthetized and the patient was sedated by the anesthesia service. The transesophageal probe was inserted in the esophagus and stomach without difficulty and multiple views were obtained.   FINDINGS:  LEFT VENTRICLE: EF = Dilated. Global HK. EF 15%  RIGHT VENTRICLE: Dilated Moderate to severe HK  LEFT ATRIUM: Dilated  LEFT ATRIAL APPENDAGE: Surgically absent. No clot in remaining stump.   RIGHT ATRIUM: Dilated  AORTIC VALVE:  Trileaflet. Moderately calcified. Mild to moderately restricted mobility. AVA by planimetry 1.5 cm2. Likely mild to moderate AS  MITRAL VALVE:    Normal. Mild to moderate MR]  TRICUSPID VALVE: Normal. Mild TR.   PULMONIC VALVE: Normal. No significant PI.  INTERATRIAL SEPTUM: No PFO or ASD.   PERICARDIUM: No effusion.   DESCENDING AORTA: Moderate plaque   CARDIOVERSION:     Indications:  Atrial Flutter  Procedure Details:  Once the TEE was complete, the patient had the defibrillator pads placed in the anterior and posterior position. Once an appropriate level of sedation was achieved, the patient received a single biphasic, synchronized 150J shock with prompt conversion to sinus rhythm. No apparent complications.   Glori Bickers, MD  11:52 AM

## 2017-09-04 NOTE — Discharge Summary (Signed)
ELECTROPHYSIOLOGY PROCEDURE DISCHARGE SUMMARY    Patient ID: Aaron Berg,  MRN: [id number], DOB/AGE: 79-21-1937 79 y.o.  Admit date: 09/03/2017 Discharge date: 09/04/2017  Primary Care Physician: [name], MD Primary Cardiologist: Tamala Julian Electrophysiologist: Nikesh Teschner  Primary Discharge Diagnosis:  1.  Atypical atrial flutter s/p TEE/DCCV this admission 2.  Chronic systolic heart failure  Secondary Discharge Diagnosis: 1.  CAD 2.  ICM 3.  OSA 4.  Prior CVA  Allergies  Allergen Reactions  . Procardia [Nifedipine] Rash    Rash develops all over the body    Procedures This Admission: 1.  TEE/DCCV on 09/04/17 by Dr Haroldine Laws. There were no early apparent complications.   Brief HPI/Hospital Course:  Aaron Berg is a 81 y.o. male who presented to the office the day of admission for routine follow up. On arrival, he was found to be in atypical atrial flutter at a rate of 124bpm. He has had increasing fatigue, exercise intolerance over the past few months, but more so over the past few weeks. He has been taking amiodarone and eliquis but missed a dose of eliquis Saturday night. He is unaware of when he went into flutter.  He does not have palpitations. He was seen by both Dr Rayann Heman and Dr Haroldine Laws. TEE/DCCV was recommended and performed on 09/04/17. His amiodarone dose was increased. He was felt to be stable for discharge to home after cardioversion if BP stable and able to ambulate. He will have early follow up with AF and HF clinics.   Physical Exam: Vitals:   09/04/17 1012 09/04/17 1018 09/04/17 1210 09/04/17 1220  BP:  96/65 (!) 82/52 (!) 81/58  Pulse: 92  64 66  Resp: 20  19 (!) 21  Temp: 97.7 F (36.5 C)     TempSrc: Oral     SpO2: 95%  95% 98%  Weight: 172 lb (78 kg)     Height: 5\' 8"  (1.727 m)       Labs:   Lab Results  Component Value Date   WBC 6.9 09/03/2017   HGB 13.9 09/03/2017   HCT 42.0 09/03/2017   MCV 91.5 09/03/2017   PLT 146 (L)  09/03/2017     Recent Labs Lab 09/03/17 1318 09/04/17 0227  NA 135 139  K 3.7 4.1  CL 96* 99*  CO2 25 32  BUN 50* 46*  CREATININE 1.84* 1.90*  CALCIUM 9.9 10.0  PROT 6.7  --   BILITOT 0.9  --   ALKPHOS 51  --   ALT 17  --   AST 28  --   GLUCOSE 76 95     Discharge Medications:  Current Discharge Medication List    CONTINUE these medications which have CHANGED   Details  amiodarone (PACERONE) 400 MG tablet Take 400mg  twice daily for 7 days then 200mg  twice daily Qty: 180 tablet, Refills: 1   Associated Diagnoses: Acute on chronic systolic heart failure ([place]); Wide-complex tachycardia ([place])      CONTINUE these medications which have NOT CHANGED   Details  Artificial Tear Ointment (DRY EYES OP) Place 2 drops into both eyes daily as needed.    Budesonide (RHINOCORT ALLERGY NA) Place 1 spray into the nose daily.    donepezil (ARICEPT) 23 MG TABS tablet TAKE 1 TABLET AT BEDTIME Qty: 90 tablet, Refills: 1    DULoxetine (CYMBALTA) 30 MG capsule TAKE 1 CAPSULE BY MOUTH EVERY DAY Qty: 30 capsule, Refills: 3   Associated Diagnoses: Major depressive disorder  with single episode, remission status unspecified    ELIQUIS 5 MG TABS tablet TAKE 1 TABLET TWICE A DAY Qty: 180 tablet, Refills: 3    feeding supplement, ENSURE ENLIVE, (ENSURE ENLIVE) LIQD Take 237 mLs by mouth 2 (two) times daily between meals. Qty: 237 mL, Refills: 12    finasteride (PROSCAR) 5 MG tablet Take 1 tablet (5 mg total) by mouth daily. Qty: 90 tablet, Refills: 3   Associated Diagnoses: BPH associated with nocturia    folic acid (FOLVITE) 1 MG tablet Take 1 tablet (1 mg total) by mouth daily. Qty: 30 tablet, Refills: 1    furosemide (LASIX) 80 MG tablet Take 1.5 tablets (120mg ) by mouth in the morning.  Take 1 tablet (80mg ) by mouth in the evening. Qty: 225 tablet, Refills: 3    KLOR-CON M20 20 MEQ tablet Take 1 tablet (20 mEq total) by mouth 2 (two) times daily. Qty: 180 tablet, Refills: 3      Multiple Vitamins-Minerals (MULTIVITAMIN WITH MINERALS) tablet Take 1 tablet by mouth at bedtime. Reported on 01/30/2016    nitroGLYCERIN (NITROSTAT) 0.4 MG SL tablet Place 1 tablet (0.4 mg total) under the tongue every 5 (five) minutes as needed for chest pain. Qty: 25 tablet, Refills: 12    polyethylene glycol (MIRALAX / GLYCOLAX) packet Take 17 g by mouth daily.    rosuvastatin (CRESTOR) 40 MG tablet Take 1 tablet (40 mg total) by mouth daily. Please keep 06/29/17 appt for future refills. Qty: 90 tablet, Refills: 3    sodium chloride (OCEAN) 0.65 % SOLN nasal spray Place 1 spray into both nostrils daily as needed for congestion.     Testosterone 20.25 MG/1.25GM (1.62%) GEL Apply 2 Squirts topically daily as needed (occasionally). One pump per shoulder.    traMADol (ULTRAM) 50 MG tablet TAKE 1 TABLET BY MOUTH EVERY 8 HOURS AS NEEDED Qty: 50 tablet, Refills: 0        Disposition: Pt is being discharged home today in good condition. Discharge Instructions    Diet - low sodium heart healthy    Complete by:  As directed    Increase activity slowly    Complete by:  As directed      Follow-up Information    Belva Crome, MD Follow up on 10/14/2017.   Specialty:  Cardiology Why:  at 3:20PM  Contact information: [address] [place] [address] [phone]        [place] Follow up on 09/11/2017.   Specialty:  Cardiology Why:  at [facility] information: [address] [id number] [place] [phone]          Duration of Discharge Encounter: Greater than 30 minutes including physician time.  Signed, Chanetta Marshall, NP 09/04/2017 12:54 PM  Thompson Grayer, MD

## 2017-09-04 NOTE — Progress Notes (Signed)
  Echocardiogram Echocardiogram Transesophageal has been performed.  Aaron Berg 09/04/2017, 12:39 PM

## 2017-09-04 NOTE — Anesthesia Postprocedure Evaluation (Signed)
Anesthesia Post Note  Patient: YANUEL TAGG  Procedure(s) Performed: Procedure(s) (LRB): TRANSESOPHAGEAL ECHOCARDIOGRAM (TEE) (N/A) CARDIOVERSION (N/A)     Patient location during evaluation: PACU Anesthesia Type: MAC Level of consciousness: awake and alert Pain management: pain level controlled Vital Signs Assessment: post-procedure vital signs reviewed and stable Respiratory status: spontaneous breathing, nonlabored ventilation, respiratory function stable and patient connected to nasal cannula oxygen Cardiovascular status: stable and blood pressure returned to baseline Postop Assessment: no apparent nausea or vomiting Anesthetic complications: no    Last Vitals:  Vitals:   09/04/17 1220 09/04/17 1254  BP: (!) 81/58 90/66  Pulse: 66 75  Resp: (!) 21 19  Temp:  (!) 36.3 C  SpO2: 98% 97%    Last Pain:  Vitals:   09/04/17 1254  TempSrc: Oral  PainSc:                  Heppner S

## 2017-09-04 NOTE — Progress Notes (Signed)
   09/04/17 0135  Vitals  BP (!) 83/71  MAP (mmHg) 77  Pulse Rate 72  ECG Heart Rate 75  Oxygen Therapy  SpO2 97 %   Patient just had a 7 beats non sustained VTach. No change from baseline, no c/o CP. Cardiologist notified.

## 2017-09-04 NOTE — Care Management Obs Status (Signed)
[facility] NOTIFICATION   Patient Details  Name: NECO KLING MRN: [id number] Date of Birth: [birth year]   Medicare Observation Status Notification Given:  Yes    Maryclare Labrador, RN 09/04/2017, 1:39 PM

## 2017-09-04 NOTE — Discharge Instructions (Signed)

## 2017-09-04 NOTE — Transfer of Care (Signed)
Immediate Anesthesia Transfer of Care Note  Patient: Aaron Berg  Procedure(s) Performed: Procedure(s): TRANSESOPHAGEAL ECHOCARDIOGRAM (TEE) (N/A) CARDIOVERSION (N/A)  Patient Location: Endoscopy Unit  Anesthesia Type:MAC  Level of Consciousness: awake, alert  and oriented  Airway & Oxygen Therapy: Patient Spontanous Breathing and Patient connected to nasal cannula oxygen  Post-op Assessment: Report given to RN and Post -op Vital signs reviewed and stable  Post vital signs: Reviewed and stable  Last Vitals:  Vitals:   09/04/17 1012 09/04/17 1018  BP:  96/65  Pulse: 92   Resp: 20   Temp: 36.5 C   SpO2: 95%     Last Pain:  Vitals:   09/04/17 1012  TempSrc: Oral  PainSc:          Complications: No apparent anesthesia complications

## 2017-09-04 NOTE — Progress Notes (Signed)
Advanced Heart Failure Rounding Note  PCP: Dr. Redmond School Primary Cardiologist: Dr. Tamala Julian  Subjective:    Stable this am. Remains in [place]. Denies CP/SOB. Plan for TEE/DCCV this am.   Creatinine relatively stable at 1.9 from 1.8 yesterday. K 4.1  Objective:   Weight Range: 177 lb 7.5 oz (80.5 kg) Body mass index is 26.98 kg/m.   Vital Signs:   Temp:  [97.5 F (36.4 C)-98 F (36.7 C)] 97.5 F (36.4 C) (09/21 0800) Pulse Rate:  [37-104] 74 (09/21 0832) Resp:  [14-29] 20 (09/21 0832) BP: (79-103)/(56-79) 82/66 (09/21 0832) SpO2:  [85 %-100 %] 93 % (09/21 0832) Weight:  [177 lb 7.5 oz (80.5 kg)] 177 lb 7.5 oz (80.5 kg) (09/20 2118) Last BM Date: 09/02/17  Weight change: Filed Weights   09/03/17 2118  Weight: 177 lb 7.5 oz (80.5 kg)    Intake/Output:   Intake/Output Summary (Last 24 hours) at 09/04/17 0955 Last data filed at 09/04/17 9702  Gross per 24 hour  Intake              240 ml  Output             1050 ml  Net             -810 ml      Physical Exam    General:  Elderly and chronically ill appearing. No resp difficulty HEENT: Normal Neck: Supple. JVP 6-7. Carotids 2+ bilat; no bruits. No lymphadenopathy or thyromegaly appreciated. Cor: PMI nondisplaced. Irregularly irregular. 2/6 MR/TR. 2/6 AS, S2 mildly diminished.  Lungs: Clear anteriorly. Abdomen: Soft, nontender, nondistended. No hepatosplenomegaly. No bruits or masses. Good bowel sounds. Extremities: No cyanosis, clubbing, or rash. Trace to 1+ ankle edema.  Neuro: Alert & orientedx3, cranial nerves grossly intact. moves all 4 extremities w/o difficulty. Affect pleasant   Telemetry   Aflutter in 70-80s with PVCs, Personally reviewed  EKG    Aflutter on admit.   Labs    CBC  Recent Labs  09/03/17 1318  WBC 6.9  NEUTROABS 5.2  HGB 13.9  HCT 42.0  MCV 91.5  PLT 637*   Basic Metabolic Panel  Recent Labs  09/03/17 1318 09/04/17 0227  NA 135 139  K 3.7 4.1  CL 96* 99*  CO2  25 32  GLUCOSE 76 95  BUN 50* 46*  CREATININE 1.84* 1.90*  CALCIUM 9.9 10.0  MG 2.5*  --    Liver Function Tests  Recent Labs  09/03/17 1318  AST 28  ALT 17  ALKPHOS 51  BILITOT 0.9  PROT 6.7  ALBUMIN 3.6   No results for input(s): LIPASE, AMYLASE in the last 72 hours. Cardiac Enzymes No results for input(s): CKTOTAL, CKMB, CKMBINDEX, TROPONINI in the last 72 hours.  BNP: BNP (last 3 results)  Recent Labs  05/12/17 1938 09/03/17 1318  BNP 1,703.1* 567.6*    ProBNP (last 3 results)  Recent Labs  06/29/17 1551 07/22/17 1158 08/04/17 1611  PROBNP 6,585* 6,747* 5,754*     D-Dimer No results for input(s): DDIMER in the last 72 hours. Hemoglobin A1C No results for input(s): HGBA1C in the last 72 hours. Fasting Lipid Panel No results for input(s): CHOL, HDL, LDLCALC, TRIG, CHOLHDL, LDLDIRECT in the last 72 hours. Thyroid Function Tests  Recent Labs  09/03/17 1318  TSH 2.319    Other results:   Imaging    Dg Chest 2 View  Result Date: 09/04/2017 CLINICAL DATA:  Tachycardia. EXAM: CHEST  2 VIEW  COMPARISON:  CT 07/13/2017. FINDINGS: Prior CABG. Cardiomegaly with mild pulmonary vascular prominence and basilar interstitial prominence. Tiny pleural effusions cannot be excluded. Findings consist with mild CHF. Old right rib fractures. Right shoulder replacement. IMPRESSION: Congestive heart failure with pulmonary interstitial edema. Electronically Signed   By: Marcello Moores  Register   On: 09/04/2017 07:51      Medications:     Scheduled Medications: . amiodarone  400 mg Oral BID  . apixaban  5 mg Oral BID  . donepezil  10 mg Oral QHS  . DULoxetine  30 mg Oral QHS  . finasteride  5 mg Oral Daily  . furosemide  120 mg Oral q morning - 10a  . furosemide  80 mg Oral Daily  . potassium chloride  20 mEq Oral BID  . rosuvastatin  40 mg Oral q1800  . sodium chloride flush  3 mL Intravenous Q12H     Infusions: . sodium chloride       PRN  Medications:  sodium chloride, acetaminophen, ondansetron (ZOFRAN) IV, sodium chloride flush, traMADol    Patient Profile   Mr Mcalexander is a 81 year old with a history of CAD S/P CABG 2003, chronic combined systolic/diastoic heart failure, HTN, hyperlipidemia, OSA on CPAP, dementia, CVA, and falls.  Sent to the ED from Dr Darliss Ridgel office with A flutter. VT. EP and HF consulted.    Assessment/Plan   1. Aypical Flutter -  EP consulted.  Unable to cariovert on admit because he missed eliquis on Saturday.  - Plan for TEE/DCCV this am.  2. Acute on chronic combined Systolic/Diastolic Heart Failure - ICM ECHO 08/2017 EF ~20%.  - Volume status stable on exam.  - Continue lasix 80 mg daily.  - No bb/ace wih hypotension  3. PAF  - Continue amiodarone 400 mg BID.  - Continue eliquis 2.5 mg BID. No bleeding noted. 4. Aortic Stenosis  - Moderate on echo. TEE today.  5. CKD Stage IV - Creatinine 1.9 this am.   Length of Stay: 1  Shirley Friar, PA-C  09/04/2017, 9:55 AM  Advanced Heart Failure Team Pager [phone] (M-F; 7a - 4p)  Please contact [facility] for night-coverage after hours (4p -7a ) and weekends on [email or web address]  Patient seen and examined with the above-signed Advanced Practice Provider and/or Housestaff. I personally reviewed laboratory data, imaging studies and relevant notes. I independently examined the patient and formulated the important aspects of the plan. I have edited the note to reflect any of my changes or salient points. I have personally discussed the plan with the patient and/or family.  Long talk about situation. Volume status ok but has progressive HF worsening over past 6+ months. Will proceed with TEE/DC-CV today. Hopefully can go home later this afternoon if stable. Will need f/u in HF Clinic in next 1-2 weeks to discuss if there are any viable advanced HF options or do we need to involve Palliative Care.  I discussed these issues with him and his 2  daughters who were at bedside.   Glori Bickers, MD  10:19 AM

## 2017-09-05 DIAGNOSIS — I5042 Chronic combined systolic (congestive) and diastolic (congestive) heart failure: Secondary | ICD-10-CM | POA: Diagnosis not present

## 2017-09-05 DIAGNOSIS — I110 Hypertensive heart disease with heart failure: Secondary | ICD-10-CM | POA: Diagnosis not present

## 2017-09-06 ENCOUNTER — Encounter (HOSPITAL_COMMUNITY): Payer: Self-pay | Admitting: Internal Medicine

## 2017-09-08 ENCOUNTER — Encounter (HOSPITAL_COMMUNITY): Payer: Self-pay | Admitting: Internal Medicine

## 2017-09-08 ENCOUNTER — Telehealth: Payer: Self-pay | Admitting: Family Medicine

## 2017-09-08 DIAGNOSIS — I110 Hypertensive heart disease with heart failure: Secondary | ICD-10-CM | POA: Diagnosis not present

## 2017-09-08 DIAGNOSIS — I5042 Chronic combined systolic (congestive) and diastolic (congestive) heart failure: Secondary | ICD-10-CM | POA: Diagnosis not present

## 2017-09-08 NOTE — Telephone Encounter (Signed)
Aaron Berg Physical therapist with [facility] health called to report that pt has fallen twice and refusing to go to hospital, has skin abrasion to left knee and right ear.Aaron Berg daughter POA to notify he has fallen & refused to go to hospital.  Daughter is going to come over later to check on pt.  Call back # is [phone]

## 2017-09-09 DIAGNOSIS — I5042 Chronic combined systolic (congestive) and diastolic (congestive) heart failure: Secondary | ICD-10-CM | POA: Diagnosis not present

## 2017-09-09 DIAGNOSIS — I110 Hypertensive heart disease with heart failure: Secondary | ICD-10-CM | POA: Diagnosis not present

## 2017-09-09 NOTE — Telephone Encounter (Signed)
received a call for [facility] with  [facility] he stated that Mr.Stones had to 2 falls today. Pt wasn't using his walker when fell going to the bathroom . Pt was told by the therapist to use the walker at all time. The therapist was at the home with pt when he fell. Pt refused assistants , and refused to go the hospital for care. He has  Skin abrasion on his lt knee and rt ear. [name] [phone]

## 2017-09-10 ENCOUNTER — Telehealth: Payer: Self-pay | Admitting: Family Medicine

## 2017-09-10 NOTE — Telephone Encounter (Signed)
Aaron Berg was notified to continue [name]

## 2017-09-10 NOTE — Telephone Encounter (Signed)
Aaron Berg with [facility] called and wants to continue the home health for another 3 weeks.  Pt just got out of hospital and fell again.  His meds have been increased by the cardiologist. Aaron Berg ph [phone]

## 2017-09-10 NOTE — Telephone Encounter (Signed)
Ok

## 2017-09-11 ENCOUNTER — Encounter (HOSPITAL_COMMUNITY): Payer: Self-pay | Admitting: Nurse Practitioner

## 2017-09-11 ENCOUNTER — Ambulatory Visit (HOSPITAL_COMMUNITY)
Admit: 2017-09-11 | Discharge: 2017-09-11 | Disposition: A | Discharge status: Home / Self Care | Payer: Medicare Other | Source: Ambulatory Visit | Attending: Nurse Practitioner | Admitting: Nurse Practitioner

## 2017-09-11 VITALS — BP 92/58 | HR 72 | Ht 68.0 in | Wt 176.4 lb

## 2017-09-11 DIAGNOSIS — Z96653 Presence of artificial knee joint, bilateral: Secondary | ICD-10-CM | POA: Diagnosis not present

## 2017-09-11 DIAGNOSIS — I484 Atypical atrial flutter: Secondary | ICD-10-CM

## 2017-09-11 DIAGNOSIS — Z800 Family history of malignant neoplasm of digestive organs: Secondary | ICD-10-CM | POA: Insufficient documentation

## 2017-09-11 DIAGNOSIS — Z951 Presence of aortocoronary bypass graft: Secondary | ICD-10-CM | POA: Insufficient documentation

## 2017-09-11 DIAGNOSIS — I480 Paroxysmal atrial fibrillation: Secondary | ICD-10-CM | POA: Diagnosis not present

## 2017-09-11 DIAGNOSIS — I5042 Chronic combined systolic (congestive) and diastolic (congestive) heart failure: Secondary | ICD-10-CM | POA: Diagnosis not present

## 2017-09-11 DIAGNOSIS — I9589 Other hypotension: Secondary | ICD-10-CM | POA: Diagnosis not present

## 2017-09-11 DIAGNOSIS — G4733 Obstructive sleep apnea (adult) (pediatric): Secondary | ICD-10-CM | POA: Insufficient documentation

## 2017-09-11 DIAGNOSIS — Z7901 Long term (current) use of anticoagulants: Secondary | ICD-10-CM | POA: Insufficient documentation

## 2017-09-11 DIAGNOSIS — Z9181 History of falling: Secondary | ICD-10-CM | POA: Insufficient documentation

## 2017-09-11 DIAGNOSIS — E785 Hyperlipidemia, unspecified: Secondary | ICD-10-CM | POA: Diagnosis not present

## 2017-09-11 DIAGNOSIS — I2510 Atherosclerotic heart disease of native coronary artery without angina pectoris: Secondary | ICD-10-CM | POA: Insufficient documentation

## 2017-09-11 DIAGNOSIS — Z888 Allergy status to other drugs, medicaments and biological substances status: Secondary | ICD-10-CM | POA: Insufficient documentation

## 2017-09-11 DIAGNOSIS — F329 Major depressive disorder, single episode, unspecified: Secondary | ICD-10-CM | POA: Diagnosis not present

## 2017-09-11 DIAGNOSIS — Z8673 Personal history of transient ischemic attack (TIA), and cerebral infarction without residual deficits: Secondary | ICD-10-CM | POA: Diagnosis not present

## 2017-09-11 DIAGNOSIS — M4800 Spinal stenosis, site unspecified: Secondary | ICD-10-CM | POA: Diagnosis not present

## 2017-09-11 DIAGNOSIS — J449 Chronic obstructive pulmonary disease, unspecified: Secondary | ICD-10-CM | POA: Diagnosis not present

## 2017-09-11 DIAGNOSIS — Z87891 Personal history of nicotine dependence: Secondary | ICD-10-CM | POA: Insufficient documentation

## 2017-09-11 DIAGNOSIS — Z9861 Coronary angioplasty status: Secondary | ICD-10-CM | POA: Insufficient documentation

## 2017-09-11 DIAGNOSIS — Z7951 Long term (current) use of inhaled steroids: Secondary | ICD-10-CM | POA: Insufficient documentation

## 2017-09-11 DIAGNOSIS — Z8249 Family history of ischemic heart disease and other diseases of the circulatory system: Secondary | ICD-10-CM | POA: Diagnosis not present

## 2017-09-11 DIAGNOSIS — Z8261 Family history of arthritis: Secondary | ICD-10-CM | POA: Insufficient documentation

## 2017-09-11 DIAGNOSIS — F0390 Unspecified dementia without behavioral disturbance: Secondary | ICD-10-CM | POA: Diagnosis not present

## 2017-09-11 DIAGNOSIS — Z9842 Cataract extraction status, left eye: Secondary | ICD-10-CM | POA: Insufficient documentation

## 2017-09-11 DIAGNOSIS — Z96611 Presence of right artificial shoulder joint: Secondary | ICD-10-CM | POA: Insufficient documentation

## 2017-09-11 DIAGNOSIS — Z79899 Other long term (current) drug therapy: Secondary | ICD-10-CM | POA: Insufficient documentation

## 2017-09-11 DIAGNOSIS — I350 Nonrheumatic aortic (valve) stenosis: Secondary | ICD-10-CM | POA: Insufficient documentation

## 2017-09-11 DIAGNOSIS — I110 Hypertensive heart disease with heart failure: Secondary | ICD-10-CM | POA: Diagnosis not present

## 2017-09-11 DIAGNOSIS — Z9841 Cataract extraction status, right eye: Secondary | ICD-10-CM | POA: Insufficient documentation

## 2017-09-11 DIAGNOSIS — Z961 Presence of intraocular lens: Secondary | ICD-10-CM | POA: Insufficient documentation

## 2017-09-11 DIAGNOSIS — Z85828 Personal history of other malignant neoplasm of skin: Secondary | ICD-10-CM | POA: Insufficient documentation

## 2017-09-11 NOTE — Progress Notes (Signed)
Primary Care Physician: [name], MD Referring Physician: Dr. Rayann Heman Cardiologist: Dr. Keturah Barre Aaron Berg is a 81 y.o. male   presented to the Dr. Thompson Caul office the day of admission for routine follow up. On arrival, he was found to be in atypical atrial flutter at a rate of 124bpm. He   had increasing fatigue, exercise intolerance over the past few months, but more so over the past few weeks. He has been taking amiodarone and eliquis but missed a dose of eliquis Saturday night. He is unaware of when he went into flutter. He does not have palpitations. He was seen by both Dr Rayann Heman and Dr Haroldine Laws. TEE/DCCV was recommended and performed on 09/04/17. His amiodarone dose was increased. He was felt to be stable for discharge to home after cardioversion.  He is in afib clinic today for f/u with his daughter. He lives in a nursing facility. He is alert, oriented and states that he feels well . As of today, he has decreased amiodarone dose to 200 mg bid. EKG shows SR at 72 bpm, qtc is 575 ms but with the reduced dose of amiodarone, this should shortened up over the next several days.  Today, he denies symptoms of palpitations, chest pain, shortness of breath, orthopnea, PND, lower extremity edema, dizziness, presyncope, syncope, or neurologic sequela. The patient is tolerating medications without difficulties and is otherwise without complaint today.   Past Medical History:  Diagnosis Date  . Aortic stenosis   . Arthritis    "thumbs, wrists, legs, spine" (05/12/2017)  . Atrial flutter ([place])   . Benign prostatic hyperplasia (BPH) with urinary urgency   . CAD (coronary artery disease)    a. s/p CABG 2003.  [name] Cerebrovascular disease 2006   multiple ischemic changes on prior MRI  . Chronic combined systolic and diastolic CHF (congestive heart failure) ([place])   . Chronic lower back pain   . Complication of anesthesia    "woke up too soon once; had 2nd stroke after right knee  replacement"  . COPD (chronic obstructive pulmonary disease) ([place])    "no signs or tests"  . CVA (cerebral vascular accident) ([place]) late 1990s; 01/2014  . Dementia   . Depression   . Dry eyes   . Dyslipidemia    well controlled  . Gait disorder   . Heart murmur    "found in ~ 1946"  . Hypertension   . Hypertensive cardiovascular disease   . Male hypogonadism   . Migraine    "major one when I was a kid; very painful"  . Mitral regurgitation   . Multiple falls    "after both knees surgeries; went to rehab; came out and fell all the time; I'm much better now" (05/12/2017)  . Obesity   . OSA on CPAP    cpap  . Paroxysmal atrial fibrillation (HCC)   . Skin cancer of anterior chest   . Spinal stenosis    Past Surgical History:  Procedure Laterality Date  . APPENDECTOMY  as child  . CARDIAC CATHETERIZATION  11/2003   Archie Endo 04/29/2011  . CARDIAC CATHETERIZATION N/A 12/03/2016   Procedure: Left Heart Cath and Cors/Grafts Angiography;  Surgeon: Belva Crome, MD;  Location: [facility];  Service: Cardiovascular;  Laterality: N/A;  . CARDIOVERSION N/A 04/26/2014   Procedure: CARDIOVERSION;  Surgeon: Sinclair Grooms, MD;  Location: [facility] ENDOSCOPY;  Service: Cardiovascular;  Laterality: N/A;  . CARDIOVERSION N/A 05/24/2014   Procedure: CARDIOVERSION;  Surgeon: Sinclair Grooms, MD;  Location: [facility] ENDOSCOPY;  Service: Cardiovascular;  Laterality: N/A;  . CARDIOVERSION N/A 09/04/2017   Procedure: CARDIOVERSION;  Surgeon: Jolaine Artist, MD;  Location: [facility];  Service: Cardiovascular;  Laterality: N/A;  . CATARACT EXTRACTION W/ INTRAOCULAR LENS  IMPLANT, BILATERAL Bilateral 2000s  . CORONARY ANGIOPLASTY  1991   "no stents" (10/06/2016)  . CORONARY ARTERY BYPASS GRAFT  11/2002   X 3/notes 04/29/2011  . CYST EXCISION     lumbar; Dr. Joya Salm  . EYE SURGERY Right    detached retina  . JOINT REPLACEMENT    . MOHS SURGERY     on chest  . RETINAL DETACHMENT SURGERY Right   .  SHOULDER SURGERY Right    with murphy, reattach ligaments  . TEE WITHOUT CARDIOVERSION N/A 09/04/2017   Procedure: TRANSESOPHAGEAL ECHOCARDIOGRAM (TEE);  Surgeon: Jolaine Artist, MD;  Location: [facility] ENDOSCOPY;  Service: Cardiovascular;  Laterality: N/A;  . TONSILLECTOMY  as child  . TOTAL KNEE ARTHROPLASTY Right 01/02/2014   Procedure: RIGHT TOTAL KNEE ARTHROPLASTY;  Surgeon: Mauri Pole, MD;  Location: [facility];  Service: Orthopedics;  Laterality: Right;  . TOTAL KNEE ARTHROPLASTY Left 01/31/2014   Procedure: LEFT TOTAL KNEE ARTHROPLASTY;  Surgeon: Mauri Pole, MD;  Location: [facility];  Service: Orthopedics;  Laterality: Left;  . TOTAL SHOULDER ARTHROPLASTY Right   . VIDEO ASSISTED THORACOSCOPY (VATS)/THOROCOTOMY  2003   benign nodule    Current Outpatient Prescriptions  Medication Sig Dispense Refill  . amiodarone (PACERONE) 400 MG tablet Take 400mg  twice daily for 7 days then 200mg  twice daily (Patient taking differently: Take 200 mg by mouth 2 (two) times daily. Take 400mg  twice daily for 7 days then 200mg  twice daily) 180 tablet 1  . Artificial Tear Ointment (DRY EYES OP) Place 2 drops into both eyes daily as needed.    . Budesonide (RHINOCORT ALLERGY NA) Place 1 spray into the nose daily.    [name] donepezil (ARICEPT) 23 MG TABS tablet TAKE 1 TABLET AT BEDTIME (Patient taking differently: Take 23 mg by mouth at bedtime) 90 tablet 1  . DULoxetine (CYMBALTA) 30 MG capsule TAKE 1 CAPSULE BY MOUTH EVERY DAY (Patient taking differently: TAKE 30 mg CAPSULE BY MOUTH  at bedtime) 30 capsule 3  . ELIQUIS 5 MG TABS tablet TAKE 1 TABLET TWICE A DAY (Patient taking differently: Take 5 mg by mouth two times a day) 180 tablet 3  . feeding supplement, ENSURE ENLIVE, (ENSURE ENLIVE) LIQD Take 237 mLs by mouth 2 (two) times daily between meals. 237 mL 12  . finasteride (PROSCAR) 5 MG tablet Take 1 tablet (5 mg total) by mouth daily. 90 tablet 3  . folic acid (FOLVITE) 1 MG tablet Take 1 tablet (1 mg total)  by mouth daily. (Patient taking differently: Take 1 mg by mouth at bedtime. ) 30 tablet 1  . furosemide (LASIX) 80 MG tablet Take 1.5 tablets (120mg ) by mouth in the morning.  Take 1 tablet (80mg ) by mouth in the evening. 225 tablet 3  . KLOR-CON M20 20 MEQ tablet Take 1 tablet (20 mEq total) by mouth 2 (two) times daily. 180 tablet 3  . Multiple Vitamins-Minerals (MULTIVITAMIN WITH MINERALS) tablet Take 1 tablet by mouth at bedtime. Reported on 01/30/2016    . nitroGLYCERIN (NITROSTAT) 0.4 MG SL tablet Place 1 tablet (0.4 mg total) under the tongue every 5 (five) minutes as needed for chest pain. 25 tablet 12  . polyethylene glycol (MIRALAX /  GLYCOLAX) packet Take 17 g by mouth daily.    . rosuvastatin (CRESTOR) 40 MG tablet Take 1 tablet (40 mg total) by mouth daily. Please keep 06/29/17 appt for future refills. 90 tablet 3  . sodium chloride (OCEAN) 0.65 % SOLN nasal spray Place 1 spray into both nostrils daily as needed for congestion.     . Testosterone 20.25 MG/1.25GM (1.62%) GEL Apply 2 Squirts topically daily as needed (occasionally). One pump per shoulder.    . traMADol (ULTRAM) 50 MG tablet TAKE 1 TABLET BY MOUTH EVERY 8 HOURS AS NEEDED (Patient taking differently: Take 50 mg by mouth every 8 hours as needed for pain) 50 tablet 0   No current facility-administered medications for this encounter.     Allergies  Allergen Reactions  . Procardia [Nifedipine] Rash    Rash develops all over the body    Social History   Social History  . Marital status: Divorced    Spouse name: N/A  . Number of children: 2  . Years of education: Masters   Occupational History  .      retired   Social History Main Topics  . Smoking status: Former Smoker    Packs/day: 2.00    Years: 33.00    Types: Cigarettes    Quit date: 06/17/1989  . Smokeless tobacco: Never Used  . Alcohol use 4.8 oz/week    8 Shots of liquor per week     Comment: 05/12/2017 "bourbon" (when he has it--ran out 05/2017)  . Drug  use: No  . Sexual activity: Not Currently   Other Topics Concern  . Not on file   Social History Narrative   Patient is single and lives alone.   Patient is retired.   Patient has a Scientist, water quality.   Patient has two adult children.   Patient is right-handed.   Patient drinks one cup of coffee daily.      Lives in [place] community   Retired Engineer, civil (consulting)    Family History  Problem Relation Age of Onset  . Pancreatic cancer Father   . CAD Father   . CAD Mother   . Arthritis Sister   . Healthy Sister     ROS- All systems are reviewed and negative except as per the HPI above  Physical Exam: Vitals:   09/11/17 1009  BP: (!) 92/58  Pulse: 72  Weight: 176 lb 6.4 oz (80 kg)  Height: 5\' 8"  (1.727 m)   Wt Readings from Last 3 Encounters:  09/11/17 176 lb 6.4 oz (80 kg)  09/04/17 172 lb (78 kg)  09/03/17 176 lb 6.4 oz (80 kg)    Labs: Lab Results  Component Value Date   NA 139 09/04/2017   K 4.1 09/04/2017   CL 99 (L) 09/04/2017   CO2 32 09/04/2017   GLUCOSE 95 09/04/2017   BUN 46 (H) 09/04/2017   CREATININE 1.90 (H) 09/04/2017   CALCIUM 10.0 09/04/2017   MG 2.5 (H) 09/03/2017   Lab Results  Component Value Date   INR 1.49 09/03/2017   Lab Results  Component Value Date   CHOL 191 01/09/2016   HDL 56 01/09/2016   LDLCALC 85 01/09/2016   TRIG 251 (H) 01/09/2016     GEN- The patient is well appearing, alert and oriented x 3 today.   Head- normocephalic, atraumatic Eyes-  Sclera clear, conjunctiva pink Ears- hearing intact Oropharynx- clear Neck- supple, no JVP Lymph- no cervical lymphadenopathy Lungs- Clear to ausculation bilaterally, normal work  of breathing Heart- Regular rate and rhythm, no murmurs, rubs or gallops, PMI not laterally displaced GI- soft, NT, ND, + BS Extremities- no clubbing, cyanosis, or edema MS- no significant deformity or atrophy Skin- no rash or lesion Psych- euthymic mood, full affect Neuro- strength and  sensation are intact  EKG-SR with first degree AV block pr int 228 ms,qrs itn 132 ms, qtc 575 ms EPIC records reviewed  Assessment and Plan: 1. Atypical atrial flutter Successful TEE/DCCV  Increased amiodarone dose on d/c to 400 mg bid, as of today, he will decrease to 200 mg bid  Continue on eliquis 5 mg bid for chadsvasc score of  at least 7 Continue cpap   2. CAD s/p bypass Currently no anginal symptoms  3. Combined sys/diastolic HF Weight stable No unusual dyspnea  4. Hypotension  Chronic  Not symptomatic with this today  F/u with Dr.Smith as scheduled  10/ [place]. Rolando Hessling, [facility] [address] [place], [place] [address] [phone]

## 2017-09-14 ENCOUNTER — Ambulatory Visit
Admission: RE | Admit: 2017-09-14 | Discharge: 2017-09-14 | Disposition: A | Discharge status: Home / Self Care | Payer: Medicare Other | Source: Ambulatory Visit | Attending: Family Medicine | Admitting: Family Medicine

## 2017-09-14 ENCOUNTER — Ambulatory Visit (INDEPENDENT_AMBULATORY_CARE_PROVIDER_SITE_OTHER): Payer: Medicare Other | Admitting: Family Medicine

## 2017-09-14 ENCOUNTER — Encounter: Payer: Self-pay | Admitting: Family Medicine

## 2017-09-14 VITALS — BP 108/64 | HR 68 | Ht 68.0 in | Wt 178.0 lb

## 2017-09-14 DIAGNOSIS — Z23 Encounter for immunization: Secondary | ICD-10-CM

## 2017-09-14 DIAGNOSIS — I2510 Atherosclerotic heart disease of native coronary artery without angina pectoris: Secondary | ICD-10-CM | POA: Diagnosis not present

## 2017-09-14 DIAGNOSIS — R0789 Other chest pain: Secondary | ICD-10-CM | POA: Diagnosis not present

## 2017-09-14 DIAGNOSIS — M545 Low back pain: Secondary | ICD-10-CM

## 2017-09-14 DIAGNOSIS — R0781 Pleurodynia: Secondary | ICD-10-CM

## 2017-09-14 DIAGNOSIS — S299XXA Unspecified injury of thorax, initial encounter: Secondary | ICD-10-CM | POA: Diagnosis not present

## 2017-09-14 LAB — POCT URINALYSIS DIP (PROADVANTAGE DEVICE)
BILIRUBIN UA: NEGATIVE
Glucose, UA: NEGATIVE mg/dL
Ketones, POC UA: NEGATIVE mg/dL
Leukocytes, UA: NEGATIVE
NITRITE UA: NEGATIVE
RBC UA: NEGATIVE
SPECIFIC GRAVITY, URINE: 1.015
Urobilinogen, Ur: NEGATIVE
pH, UA: 6 (ref 5.0–8.0)

## 2017-09-14 NOTE — Patient Instructions (Signed)
You can also take 2 Tylenol 4 times per day You can take tramadol 2 tablets every 6 hours as needed for pain in addition to the Tylenol

## 2017-09-14 NOTE — Progress Notes (Signed)
   Subjective:    Patient ID: Aaron Berg, male    DOB: Mar 25, 1936, 81 y.o.   MRN: [id number]  HPI Last Tuesday while going to the bathroom to urinate, he lost his balance and fell injuring both lower ribs. He did not have a direct trauma on the ribs. Since then he has been using tramadol 3 times per day without much success. No shortness of breath, nausea vomiting, cough or congestion.   Review of Systems     Objective:   Physical Exam Alert and in complaining of bilateral lower rib pain. Lungs are clear to auscultation. No point tenderness over the ribs. Abdominal exam shows no masses or tenderness.       Assessment & Plan:  Low back pain, unspecified back pain laterality, unspecified chronicity, with sciatica presence unspecified - Plan: POCT Urinalysis DIP (Proadvantage Device), DG Chest 2 View  Need for influenza vaccination - Plan: Flu vaccine HIGH DOSE PF (Fluzone High dose)  Rib pain on left side - Plan: DG Chest 2 View Discussed getting a checks x-ray and I'll go ahead and do that to make sure there is nothing else going on. Recommend using Tylenol on a regular basis and may take up to 2 pills 4 times a day of tramadol to help with the pain. He was comfortable with this. His son-in-law is with him.

## 2017-09-15 ENCOUNTER — Other Ambulatory Visit: Payer: Self-pay | Admitting: Family Medicine

## 2017-09-15 ENCOUNTER — Telehealth: Payer: Self-pay | Admitting: Family Medicine

## 2017-09-15 DIAGNOSIS — I5042 Chronic combined systolic (congestive) and diastolic (congestive) heart failure: Secondary | ICD-10-CM | POA: Diagnosis not present

## 2017-09-15 DIAGNOSIS — I110 Hypertensive heart disease with heart failure: Secondary | ICD-10-CM | POA: Diagnosis not present

## 2017-09-15 NOTE — Telephone Encounter (Signed)
ok 

## 2017-09-15 NOTE — Telephone Encounter (Signed)
Aaron Berg,a physical Therapist at [facility], called to let Dr Alfredia Ferguson know that pt has met his max potential for PT. Patient and daughter are in agreement with this so pt is being discharged from home health PT.

## 2017-09-15 NOTE — Telephone Encounter (Signed)
Is this okay to refill? 

## 2017-09-16 NOTE — Telephone Encounter (Signed)
Called in med to pharmacy  

## 2017-09-17 ENCOUNTER — Ambulatory Visit: Payer: Medicare Other | Admitting: Adult Health

## 2017-09-17 DIAGNOSIS — I110 Hypertensive heart disease with heart failure: Secondary | ICD-10-CM | POA: Diagnosis not present

## 2017-09-17 DIAGNOSIS — I5042 Chronic combined systolic (congestive) and diastolic (congestive) heart failure: Secondary | ICD-10-CM | POA: Diagnosis not present

## 2017-09-21 ENCOUNTER — Emergency Department (HOSPITAL_COMMUNITY): Payer: Medicare Other

## 2017-09-21 ENCOUNTER — Encounter (HOSPITAL_COMMUNITY): Payer: Self-pay

## 2017-09-21 ENCOUNTER — Emergency Department (HOSPITAL_COMMUNITY)
Admission: EM | Admit: 2017-09-21 | Discharge: 2017-09-22 | Disposition: A | Discharge status: Home / Self Care | Payer: Medicare Other | Attending: Emergency Medicine | Admitting: Emergency Medicine

## 2017-09-21 DIAGNOSIS — Z8673 Personal history of transient ischemic attack (TIA), and cerebral infarction without residual deficits: Secondary | ICD-10-CM | POA: Diagnosis not present

## 2017-09-21 DIAGNOSIS — R296 Repeated falls: Secondary | ICD-10-CM | POA: Diagnosis not present

## 2017-09-21 DIAGNOSIS — S299XXA Unspecified injury of thorax, initial encounter: Secondary | ICD-10-CM | POA: Diagnosis not present

## 2017-09-21 DIAGNOSIS — G301 Alzheimer's disease with late onset: Secondary | ICD-10-CM | POA: Insufficient documentation

## 2017-09-21 DIAGNOSIS — Z96653 Presence of artificial knee joint, bilateral: Secondary | ICD-10-CM | POA: Insufficient documentation

## 2017-09-21 DIAGNOSIS — W010XXA Fall on same level from slipping, tripping and stumbling without subsequent striking against object, initial encounter: Secondary | ICD-10-CM | POA: Insufficient documentation

## 2017-09-21 DIAGNOSIS — R55 Syncope and collapse: Secondary | ICD-10-CM | POA: Insufficient documentation

## 2017-09-21 DIAGNOSIS — Z85828 Personal history of other malignant neoplasm of skin: Secondary | ICD-10-CM | POA: Diagnosis not present

## 2017-09-21 DIAGNOSIS — Y9389 Activity, other specified: Secondary | ICD-10-CM | POA: Diagnosis not present

## 2017-09-21 DIAGNOSIS — F0280 Dementia in other diseases classified elsewhere without behavioral disturbance: Secondary | ICD-10-CM | POA: Insufficient documentation

## 2017-09-21 DIAGNOSIS — J449 Chronic obstructive pulmonary disease, unspecified: Secondary | ICD-10-CM | POA: Diagnosis not present

## 2017-09-21 DIAGNOSIS — I110 Hypertensive heart disease with heart failure: Secondary | ICD-10-CM | POA: Insufficient documentation

## 2017-09-21 DIAGNOSIS — Z79899 Other long term (current) drug therapy: Secondary | ICD-10-CM | POA: Insufficient documentation

## 2017-09-21 DIAGNOSIS — S0990XA Unspecified injury of head, initial encounter: Secondary | ICD-10-CM | POA: Diagnosis not present

## 2017-09-21 DIAGNOSIS — Z951 Presence of aortocoronary bypass graft: Secondary | ICD-10-CM | POA: Insufficient documentation

## 2017-09-21 DIAGNOSIS — I447 Left bundle-branch block, unspecified: Secondary | ICD-10-CM | POA: Diagnosis not present

## 2017-09-21 DIAGNOSIS — Z5320 Procedure and treatment not carried out because of patient's decision for unspecified reasons: Secondary | ICD-10-CM

## 2017-09-21 DIAGNOSIS — Y92129 Unspecified place in nursing home as the place of occurrence of the external cause: Secondary | ICD-10-CM | POA: Diagnosis not present

## 2017-09-21 DIAGNOSIS — I2510 Atherosclerotic heart disease of native coronary artery without angina pectoris: Secondary | ICD-10-CM | POA: Diagnosis not present

## 2017-09-21 DIAGNOSIS — I5042 Chronic combined systolic (congestive) and diastolic (congestive) heart failure: Secondary | ICD-10-CM | POA: Diagnosis not present

## 2017-09-21 DIAGNOSIS — M5489 Other dorsalgia: Secondary | ICD-10-CM | POA: Diagnosis not present

## 2017-09-21 DIAGNOSIS — Z7901 Long term (current) use of anticoagulants: Secondary | ICD-10-CM | POA: Diagnosis not present

## 2017-09-21 DIAGNOSIS — Y998 Other external cause status: Secondary | ICD-10-CM | POA: Insufficient documentation

## 2017-09-21 DIAGNOSIS — Z87891 Personal history of nicotine dependence: Secondary | ICD-10-CM | POA: Insufficient documentation

## 2017-09-21 DIAGNOSIS — S199XXA Unspecified injury of neck, initial encounter: Secondary | ICD-10-CM | POA: Diagnosis not present

## 2017-09-21 DIAGNOSIS — W19XXXA Unspecified fall, initial encounter: Secondary | ICD-10-CM

## 2017-09-21 DIAGNOSIS — T148XXA Other injury of unspecified body region, initial encounter: Secondary | ICD-10-CM | POA: Diagnosis not present

## 2017-09-21 LAB — CBC WITH DIFFERENTIAL/PLATELET
BASOS ABS: 0 10*3/uL (ref 0.0–0.1)
BASOS PCT: 0 %
Eosinophils Absolute: 0.1 10*3/uL (ref 0.0–0.7)
Eosinophils Relative: 1 %
HEMATOCRIT: 37.9 % — AB (ref 39.0–52.0)
Hemoglobin: 12.4 g/dL — ABNORMAL LOW (ref 13.0–17.0)
LYMPHS PCT: 14 %
Lymphs Abs: 1.1 10*3/uL (ref 0.7–4.0)
MCH: 30 pg (ref 26.0–34.0)
MCHC: 32.7 g/dL (ref 30.0–36.0)
MCV: 91.8 fL (ref 78.0–100.0)
Monocytes Absolute: 0.4 10*3/uL (ref 0.1–1.0)
Monocytes Relative: 5 %
NEUTROS ABS: 6.2 10*3/uL (ref 1.7–7.7)
NEUTROS PCT: 80 %
PLATELETS: 129 10*3/uL — AB (ref 150–400)
RBC: 4.13 MIL/uL — AB (ref 4.22–5.81)
RDW: 15 % (ref 11.5–15.5)
WBC: 7.7 10*3/uL (ref 4.0–10.5)

## 2017-09-21 LAB — I-STAT TROPONIN, ED: Troponin i, poc: 0.04 ng/mL (ref 0.00–0.08)

## 2017-09-21 NOTE — ED Provider Notes (Signed)
[facility] DEPT Provider Note   CSN: [id number] Arrival date & time: 09/21/17  2154     History   Chief Complaint Chief Complaint  Patient presents with  . Fall    HPI Aaron Berg is a 81 y.o. male.  Patient with history of dementia, a-flutter, CAD, presents to the ED with a chief complaint of unwitnessed fall.  Patient does not recall what happened.  He states that he does remember being on the floor for "a very long time," and then he remembered that he had an emergency alert necklace.  He was brought to the ED by EMS from [facility] assisted living.  He denies any pain.  He denies any symptoms at this time.   The history is provided by the patient. No language interpreter was used.    Past Medical History:  Diagnosis Date  . Aortic stenosis   . Arthritis    "thumbs, wrists, legs, spine" (05/12/2017)  . Atrial flutter ([place])   . Benign prostatic hyperplasia (BPH) with urinary urgency   . CAD (coronary artery disease)    a. s/p CABG 2003.  [name] Cerebrovascular disease 2006   multiple ischemic changes on prior MRI  . Chronic combined systolic and diastolic CHF (congestive heart failure) ([place])   . Chronic lower back pain   . Complication of anesthesia    "woke up too soon once; had 2nd stroke after right knee replacement"  . COPD (chronic obstructive pulmonary disease) ([place])    "no signs or tests"  . CVA (cerebral vascular accident) ([place]) late 1990s; 01/2014  . Dementia   . Depression   . Dry eyes   . Dyslipidemia    well controlled  . Gait disorder   . Heart murmur    "found in ~ 1946"  . Hypertension   . Hypertensive cardiovascular disease   . Male hypogonadism   . Migraine    "major one when I was a kid; very painful"  . Mitral regurgitation   . Multiple falls    "after both knees surgeries; went to rehab; came out and fell all the time; I'm much better now" (05/12/2017)  . Obesity   . OSA on CPAP    cpap  . Paroxysmal atrial fibrillation (HCC)   . Skin  cancer of anterior chest   . Spinal stenosis     Patient Active Problem List   Diagnosis Date Noted  . Tachycardia 09/04/2017  . Wide-complex tachycardia ([place]) 09/03/2017  . Aortic stenosis 07/30/2017  . Coronary artery disease involving coronary bypass graft of native heart with angina pectoris ([place]) 06/23/2017  . Benign prostatic hyperplasia with incomplete bladder emptying 05/21/2017  . Acute on chronic systolic heart failure ([place]) 05/12/2017  . Benign essential tremor 09/17/2016  . Rhinitis, allergic 09/11/2016  . Chronic low back pain 09/11/2016  . Status post total bilateral knee replacement 09/11/2016  . Multifactorial gait disorder 05/29/2016  . OSA (obstructive sleep apnea) 03/31/2016  . Late onset Alzheimer's disease without behavioral disturbance 03/31/2016  . Atrial flutter ([place]) 10/03/2014  . Multiple falls 08/09/2014  . Chronic anticoagulation 06/30/2014  . Paroxysmal atrial fibrillation ([place]) 03/03/2014  . Unspecified arthropathy, lower leg 01/29/2014  . Chronic airway obstruction, not elsewhere classified 01/29/2014  . Obesity (BMI 30-39.9) 01/04/2014  . Chronic combined systolic and diastolic CHF (congestive heart failure) ([place]) 10/11/2013  . Essential hypertension, benign 10/11/2013  . Hyperlipidemia with target LDL less than 70 07/09/2011  . Hypogonadism male 07/09/2011    Past Surgical  History:  Procedure Laterality Date  . APPENDECTOMY  as child  . CARDIAC CATHETERIZATION  11/2003   Archie Endo 04/29/2011  . CARDIAC CATHETERIZATION N/A 12/03/2016   Procedure: Left Heart Cath and Cors/Grafts Angiography;  Surgeon: Belva Crome, MD;  Location: [facility];  Service: Cardiovascular;  Laterality: N/A;  . CARDIOVERSION N/A 04/26/2014   Procedure: CARDIOVERSION;  Surgeon: Sinclair Grooms, MD;  Location: [facility] ENDOSCOPY;  Service: Cardiovascular;  Laterality: N/A;  . CARDIOVERSION N/A 05/24/2014   Procedure: CARDIOVERSION;  Surgeon: Sinclair Grooms, MD;   Location: [facility] ENDOSCOPY;  Service: Cardiovascular;  Laterality: N/A;  . CARDIOVERSION N/A 09/04/2017   Procedure: CARDIOVERSION;  Surgeon: Jolaine Artist, MD;  Location: [facility];  Service: Cardiovascular;  Laterality: N/A;  . CATARACT EXTRACTION W/ INTRAOCULAR LENS  IMPLANT, BILATERAL Bilateral 2000s  . CORONARY ANGIOPLASTY  1991   "no stents" (10/06/2016)  . CORONARY ARTERY BYPASS GRAFT  11/2002   X 3/notes 04/29/2011  . CYST EXCISION     lumbar; Dr. Joya Salm  . EYE SURGERY Right    detached retina  . JOINT REPLACEMENT    . MOHS SURGERY     on chest  . RETINAL DETACHMENT SURGERY Right   . SHOULDER SURGERY Right    with murphy, reattach ligaments  . TEE WITHOUT CARDIOVERSION N/A 09/04/2017   Procedure: TRANSESOPHAGEAL ECHOCARDIOGRAM (TEE);  Surgeon: Jolaine Artist, MD;  Location: [facility] ENDOSCOPY;  Service: Cardiovascular;  Laterality: N/A;  . TONSILLECTOMY  as child  . TOTAL KNEE ARTHROPLASTY Right 01/02/2014   Procedure: RIGHT TOTAL KNEE ARTHROPLASTY;  Surgeon: Mauri Pole, MD;  Location: [facility] ORS;  Service: Orthopedics;  Laterality: Right;  . TOTAL KNEE ARTHROPLASTY Left 01/31/2014   Procedure: LEFT TOTAL KNEE ARTHROPLASTY;  Surgeon: Mauri Pole, MD;  Location: [facility] ORS;  Service: Orthopedics;  Laterality: Left;  . TOTAL SHOULDER ARTHROPLASTY Right   . VIDEO ASSISTED THORACOSCOPY (VATS)/THOROCOTOMY  2003   benign nodule       Home Medications    Prior to Admission medications   Medication Sig Start Date End Date Taking? Authorizing Provider  amiodarone (PACERONE) 400 MG tablet Take 400mg  twice daily for 7 days then 200mg  twice daily Patient taking differently: Take 200 mg by mouth 2 (two) times daily. Take 400mg  twice daily for 7 days then 200mg  twice daily 09/04/17   Chanetta Marshall K, NP  Artificial Tear Ointment (DRY EYES OP) Place 2 drops into both eyes daily as needed.    [provider]  Budesonide (RHINOCORT ALLERGY NA) Place 1 spray into the nose daily.     [provider]  donepezil (ARICEPT) 23 MG TABS tablet TAKE 1 TABLET AT BEDTIME Patient taking differently: Take 23 mg by mouth at bedtime 03/17/17   Dohmeier, [name], MD  DULoxetine (CYMBALTA) 30 MG capsule TAKE 1 CAPSULE BY MOUTH EVERY DAY Patient taking differently: TAKE 30 mg CAPSULE BY MOUTH  at bedtime 08/03/17   [name], MD  ELIQUIS 5 MG TABS tablet TAKE 1 TABLET TWICE A DAY Patient taking differently: Take 5 mg by mouth two times a day 06/19/17   Belva Crome, MD  feeding supplement, ENSURE ENLIVE, (ENSURE ENLIVE) LIQD Take 237 mLs by mouth 2 (two) times daily between meals. 10/08/16   Reyne Dumas, MD  finasteride (PROSCAR) 5 MG tablet Take 1 tablet (5 mg total) by mouth daily. 05/21/17   [name], MD  folic acid (FOLVITE) 1 MG tablet Take 1 tablet (  1 mg total) by mouth daily. Patient taking differently: Take 1 mg by mouth at bedtime.  10/09/16   Reyne Dumas, MD  furosemide (LASIX) 80 MG tablet Take 1.5 tablets (120mg ) by mouth in the morning.  Take 1 tablet (80mg ) by mouth in the evening. 08/05/17   Belva Crome, MD  KLOR-CON M20 20 MEQ tablet Take 1 tablet (20 mEq total) by mouth 2 (two) times daily. 08/07/17   Belva Crome, MD  Multiple Vitamins-Minerals (MULTIVITAMIN WITH MINERALS) tablet Take 1 tablet by mouth at bedtime. Reported on 01/30/2016    [provider]  nitroGLYCERIN (NITROSTAT) 0.4 MG SL tablet Place 1 tablet (0.4 mg total) under the tongue every 5 (five) minutes as needed for chest pain. 10/07/16   Bhagat, Crista Luria, PA  polyethylene glycol (MIRALAX / GLYCOLAX) packet Take 17 g by mouth daily.    [provider]  rosuvastatin (CRESTOR) 40 MG tablet Take 1 tablet (40 mg total) by mouth daily. Please keep 06/29/17 appt for future refills. 06/18/17   Burtis Junes, NP  sodium chloride (OCEAN) 0.65 % SOLN nasal spray Place 1 spray into both nostrils daily as needed for congestion.     [provider]  Testosterone 20.25  MG/1.25GM (1.62%) GEL Apply 2 Squirts topically daily as needed (occasionally). One pump per shoulder. 03/06/14   Reed, Tiffany L, DO  traMADol (ULTRAM) 50 MG tablet TAKE 1 TABLET BY MOUTH EVERY 8 HOURS AS NEEDED 09/15/17   [name], MD    Family History Family History  Problem Relation Age of Onset  . Pancreatic cancer Father   . CAD Father   . CAD Mother   . Arthritis Sister   . Healthy Sister     Social History Social History  Substance Use Topics  . Smoking status: Former Smoker    Packs/day: 2.00    Years: 33.00    Types: Cigarettes    Quit date: 06/17/1989  . Smokeless tobacco: Never Used  . Alcohol use 4.8 oz/week    8 Shots of liquor per week     Comment: 05/12/2017 "bourbon" (when he has it--ran out 05/2017)     Allergies   Procardia [nifedipine]   Review of Systems Review of Systems  All other systems reviewed and are negative.    Physical Exam Updated Vital Signs BP 93/62   Pulse 62   Temp 98 F (36.7 C) (Oral)   Resp 12   SpO2 100%   Physical Exam  Constitutional: He is oriented to person, place, and time. He appears well-developed and well-nourished.  HENT:  Head: Normocephalic and atraumatic.  Eyes: Pupils are equal, round, and reactive to light. Conjunctivae and EOM are normal. Right eye exhibits no discharge. Left eye exhibits no discharge. No scleral icterus.  Neck: Normal range of motion. Neck supple. No JVD present.  Cardiovascular: Normal rate, regular rhythm and normal heart sounds.  Exam reveals no gallop and no friction rub.   No murmur heard. Pulmonary/Chest: Effort normal and breath sounds normal. No respiratory distress. He has no wheezes. He has no rales. He exhibits no tenderness.  Abdominal: Soft. He exhibits no distension and no mass. There is no tenderness. There is no rebound and no guarding.  Musculoskeletal: Normal range of motion. He exhibits no edema or tenderness.  Neurological: He is alert and oriented to person, place,  and time.  Skin: Skin is warm and dry.  Psychiatric: He has a normal mood and affect. His behavior is  normal. Judgment and thought content normal.  Nursing note and vitals reviewed.    ED Treatments / Results  Labs (all labs ordered are listed, but only abnormal results are displayed) Labs Reviewed  CBC WITH DIFFERENTIAL/PLATELET - Abnormal; Notable for the following:       Result Value   RBC 4.13 (*)    Hemoglobin 12.4 (*)    HCT 37.9 (*)    Platelets 129 (*)    All other components within normal limits  BASIC METABOLIC PANEL - Abnormal; Notable for the following:    BUN 31 (*)    Creatinine, Ser 1.88 (*)    GFR calc non Af Amer 32 (*)    GFR calc Af Amer 38 (*)    All other components within normal limits  URINALYSIS, ROUTINE W REFLEX MICROSCOPIC - Abnormal; Notable for the following:    APPearance HAZY (*)    All other components within normal limits  CK - Abnormal; Notable for the following:    Total CK 438 (*)    All other components within normal limits  I-STAT TROPONIN, ED  I-STAT TROPONIN, ED  POC OCCULT BLOOD, ED    EKG  EKG Interpretation  Date/Time:  Monday September 21 2017 22:12:57 EDT Ventricular Rate:  63 PR Interval:    QRS Duration: 141 QT Interval:  574 QTC Calculation: 588 R Axis:   -10 Text Interpretation:  Sinus rhythm Prolonged PR interval Left bundle branch block Otherwise no significant change Confirmed by Addison Lank [phone]) on 09/22/2017 12:18:33 AM       Radiology Dg Chest 1 View  Result Date: 09/21/2017 CLINICAL DATA:  Status post fall, with concern for chest injury. Initial encounter. EXAM: CHEST 1 VIEW COMPARISON:  Chest radiograph performed 09/14/2017 FINDINGS: The lungs are well-aerated. Minimal left basilar atelectasis is noted. There is no evidence of pleural effusion or pneumothorax. The cardiomediastinal silhouette is borderline normal in size. The patient is status post median sternotomy, with evidence of prior CABG. No acute  osseous abnormalities are seen. Chronic left-sided rib deformities are noted. A right shoulder arthroplasty is grossly unremarkable in appearance. IMPRESSION: Minimal left basilar atelectasis noted. Lungs otherwise clear. No displaced rib fractures. Electronically Signed   By: Garald Balding M.D.   On: 09/21/2017 23:11   Ct Head Wo Contrast  Result Date: 09/21/2017 CLINICAL DATA:  Status post fall, with concern for head or cervical spine injury. Patient on blood thinners. Initial encounter. EXAM: CT HEAD WITHOUT CONTRAST CT CERVICAL SPINE WITHOUT CONTRAST TECHNIQUE: Multidetector CT imaging of the head and cervical spine was performed following the standard protocol without intravenous contrast. Multiplanar CT image reconstructions of the cervical spine were also generated. COMPARISON:  MRI of the brain performed 04/23/2016 FINDINGS: CT HEAD FINDINGS Brain: No evidence of acute infarction, hemorrhage, hydrocephalus, extra-axial collection or mass lesion/mass effect. Prominence of the ventricles and sulci reflects moderate cortical volume loss. Scattered periventricular and subcortical white matter change likely reflects small vessel ischemic microangiopathy. Cerebellar atrophy is noted. The brainstem and fourth ventricle are within normal limits. The basal ganglia are unremarkable in appearance. The cerebral hemispheres demonstrate grossly normal gray-white differentiation. No mass effect or midline shift is seen. Vascular: No hyperdense vessel or unexpected calcification. Skull: There is no evidence of fracture; visualized osseous structures are unremarkable in appearance. Sinuses/Orbits: The orbits are within normal limits. The paranasal sinuses and mastoid air cells are well-aerated. Other: No significant soft tissue abnormalities are seen. CT CERVICAL SPINE FINDINGS Alignment: There is mild  grade 1 anterolisthesis of C4 on C5 and of C7 on T1, reflecting underlying facet disease. Skull base and vertebrae: No  acute fracture. No primary bone lesion or focal pathologic process. Soft tissues and spinal canal: No prevertebral fluid or swelling. No visible canal hematoma. Disc levels: Multilevel disc space narrowing is noted along the lower cervical spine, with scattered anterior and posterior disc osteophyte complexes. Degenerative change is noted about the dens. Upper chest: The visualized lung apices are clear. The thyroid gland is unremarkable. Dense calcification is noted at the carotid bifurcations bilaterally. Other: No additional soft tissue abnormalities are seen. IMPRESSION: 1. No evidence of traumatic intracranial injury or fracture. 2. No evidence of acute fracture or subluxation along the cervical spine. 3. Moderate cortical volume loss and scattered small vessel ischemic microangiopathy. 4. Degenerative change along the cervical spine. 5. Dense calcification at the carotid bifurcations bilaterally. Carotid ultrasound would be helpful for further evaluation, when and as deemed clinically appropriate. Electronically Signed   By: Garald Balding M.D.   On: 09/21/2017 23:30   Ct Cervical Spine Wo Contrast  Result Date: 09/21/2017 CLINICAL DATA:  Status post fall, with concern for head or cervical spine injury. Patient on blood thinners. Initial encounter. EXAM: CT HEAD WITHOUT CONTRAST CT CERVICAL SPINE WITHOUT CONTRAST TECHNIQUE: Multidetector CT imaging of the head and cervical spine was performed following the standard protocol without intravenous contrast. Multiplanar CT image reconstructions of the cervical spine were also generated. COMPARISON:  MRI of the brain performed 04/23/2016 FINDINGS: CT HEAD FINDINGS Brain: No evidence of acute infarction, hemorrhage, hydrocephalus, extra-axial collection or mass lesion/mass effect. Prominence of the ventricles and sulci reflects moderate cortical volume loss. Scattered periventricular and subcortical white matter change likely reflects small vessel ischemic  microangiopathy. Cerebellar atrophy is noted. The brainstem and fourth ventricle are within normal limits. The basal ganglia are unremarkable in appearance. The cerebral hemispheres demonstrate grossly normal gray-white differentiation. No mass effect or midline shift is seen. Vascular: No hyperdense vessel or unexpected calcification. Skull: There is no evidence of fracture; visualized osseous structures are unremarkable in appearance. Sinuses/Orbits: The orbits are within normal limits. The paranasal sinuses and mastoid air cells are well-aerated. Other: No significant soft tissue abnormalities are seen. CT CERVICAL SPINE FINDINGS Alignment: There is mild grade 1 anterolisthesis of C4 on C5 and of C7 on T1, reflecting underlying facet disease. Skull base and vertebrae: No acute fracture. No primary bone lesion or focal pathologic process. Soft tissues and spinal canal: No prevertebral fluid or swelling. No visible canal hematoma. Disc levels: Multilevel disc space narrowing is noted along the lower cervical spine, with scattered anterior and posterior disc osteophyte complexes. Degenerative change is noted about the dens. Upper chest: The visualized lung apices are clear. The thyroid gland is unremarkable. Dense calcification is noted at the carotid bifurcations bilaterally. Other: No additional soft tissue abnormalities are seen. IMPRESSION: 1. No evidence of traumatic intracranial injury or fracture. 2. No evidence of acute fracture or subluxation along the cervical spine. 3. Moderate cortical volume loss and scattered small vessel ischemic microangiopathy. 4. Degenerative change along the cervical spine. 5. Dense calcification at the carotid bifurcations bilaterally. Carotid ultrasound would be helpful for further evaluation, when and as deemed clinically appropriate. Electronically Signed   By: Garald Balding M.D.   On: 09/21/2017 23:30    Procedures Procedures (including critical care time)  Medications  Ordered in ED Medications - No data to display   Initial Impression / Assessment and Plan /  ED Course  I have reviewed the triage vital signs and the nursing notes.  Pertinent labs & imaging results that were available during my care of the patient were reviewed by me and considered in my medical decision making (see chart for details).     Patient with unwitnessed fall.  Unclear how long he was down for.  Can't recall the incident.  Does have dementia, but is alert to place, and person, but not time.  Hypotensive, but patient reports this is normal.  This is confirmed by reviewing multiple prior records with pressures in the 80s-90s.  Will check labs and reassess.  Patient seen by and discussed with Dr. Leonette Monarch, who performed bedside FAST, which was negative.  Prior records reviewed also show the patient has frequent falls. He is not very compliant with using his walker. He has a home health aide currently. I contacted his daughter, and the patient is at his mental baseline. His laboratory workup is reassuring. I do note that his hemoglobin is a little lower than previous, but he has negative FOBT.  Repeat troponin is negative. Urinalysis normal. Plan for discharge to home with primary care follow-up. Final Clinical Impressions(s) / ED Diagnoses   Final diagnoses:  Fall, initial encounter    New Prescriptions New Prescriptions   No medications on file     Adriana, Lina, Hershal Coria 09/22/17 [id number]    Fatima Blank, MD 09/23/17 [id number]    Fatima Blank, MD 09/23/17 [phone]

## 2017-09-21 NOTE — ED Triage Notes (Signed)
Pt comes via [facility], [facility] assisted [facility], hx of dementia, fell today, unknown LOC, pt unsure of what happened, incontinence of urine. AxO x4 with moments of confusion. On blood thinners.  C/o of back pain, L elbow pain

## 2017-09-22 DIAGNOSIS — R55 Syncope and collapse: Secondary | ICD-10-CM | POA: Diagnosis not present

## 2017-09-22 DIAGNOSIS — R4182 Altered mental status, unspecified: Secondary | ICD-10-CM | POA: Diagnosis not present

## 2017-09-22 DIAGNOSIS — S79911A Unspecified injury of right hip, initial encounter: Secondary | ICD-10-CM | POA: Diagnosis not present

## 2017-09-22 DIAGNOSIS — G8911 Acute pain due to trauma: Secondary | ICD-10-CM | POA: Diagnosis not present

## 2017-09-22 LAB — URINALYSIS, ROUTINE W REFLEX MICROSCOPIC
Bilirubin Urine: NEGATIVE
Glucose, UA: NEGATIVE mg/dL
Hgb urine dipstick: NEGATIVE
Ketones, ur: NEGATIVE mg/dL
LEUKOCYTES UA: NEGATIVE
Nitrite: NEGATIVE
PROTEIN: NEGATIVE mg/dL
SPECIFIC GRAVITY, URINE: 1.009 (ref 1.005–1.030)
pH: 8 (ref 5.0–8.0)

## 2017-09-22 LAB — BASIC METABOLIC PANEL
Anion gap: 13 (ref 5–15)
BUN: 31 mg/dL — ABNORMAL HIGH (ref 6–20)
CO2: 23 mmol/L (ref 22–32)
Calcium: 9.3 mg/dL (ref 8.9–10.3)
Chloride: 103 mmol/L (ref 101–111)
Creatinine, Ser: 1.88 mg/dL — ABNORMAL HIGH (ref 0.61–1.24)
GFR calc Af Amer: 38 mL/min — ABNORMAL LOW (ref >60)
GFR calc non Af Amer: 32 mL/min — ABNORMAL LOW (ref >60)
Glucose, Bld: 71 mg/dL (ref 65–99)
Potassium: 3.7 mmol/L (ref 3.5–5.1)
Sodium: 139 mmol/L (ref 135–145)

## 2017-09-22 LAB — CK: CK TOTAL: 438 U/L — AB (ref 49–397)

## 2017-09-22 LAB — POC OCCULT BLOOD, ED: Fecal Occult Bld: NEGATIVE

## 2017-09-22 LAB — I-STAT TROPONIN, ED: Troponin i, poc: 0.03 ng/mL (ref 0.00–0.08)

## 2017-09-23 ENCOUNTER — Telehealth: Payer: Self-pay | Admitting: Family Medicine

## 2017-09-23 DIAGNOSIS — I110 Hypertensive heart disease with heart failure: Secondary | ICD-10-CM | POA: Diagnosis not present

## 2017-09-23 DIAGNOSIS — I5042 Chronic combined systolic (congestive) and diastolic (congestive) heart failure: Secondary | ICD-10-CM | POA: Diagnosis not present

## 2017-09-23 NOTE — Telephone Encounter (Signed)
Anderson Malta with [facility], called to report pt had a fall on 09/21/17 at home & was taken to ER and had no major injuries and was sent back home

## 2017-09-25 ENCOUNTER — Other Ambulatory Visit: Payer: Self-pay | Admitting: Neurology

## 2017-09-25 ENCOUNTER — Other Ambulatory Visit: Payer: Self-pay | Admitting: Family Medicine

## 2017-09-27 ENCOUNTER — Other Ambulatory Visit: Payer: Self-pay | Admitting: Family Medicine

## 2017-09-28 MED ORDER — TRAMADOL HCL 50 MG PO TABS: ORAL_TABLET | ORAL | 0 refills | Status: DC

## 2017-09-28 NOTE — Telephone Encounter (Signed)
Please advise- would you like me to call in the tramadol 1 tablet  every 8 hours or 2 tablet every 6 hours? [name]

## 2017-09-28 NOTE — Telephone Encounter (Signed)
2 tablets every 8 hours

## 2017-09-28 NOTE — Telephone Encounter (Signed)
ok 

## 2017-09-28 NOTE — Telephone Encounter (Signed)
Called in rx. Aaron Berg

## 2017-09-29 ENCOUNTER — Telehealth: Payer: Self-pay | Admitting: Family Medicine

## 2017-09-29 DIAGNOSIS — F329 Major depressive disorder, single episode, unspecified: Secondary | ICD-10-CM

## 2017-09-29 MED ORDER — DULOXETINE HCL 30 MG PO CPEP: 30.0000 mg | ORAL_CAPSULE | Freq: Every day | ORAL | 1 refills | Status: DC

## 2017-09-29 NOTE — Telephone Encounter (Signed)
Recv'd fax from [facility] for refill Duloxetine for 90 days

## 2017-09-30 ENCOUNTER — Telehealth: Payer: Self-pay

## 2017-09-30 DIAGNOSIS — I110 Hypertensive heart disease with heart failure: Secondary | ICD-10-CM | POA: Diagnosis not present

## 2017-09-30 DIAGNOSIS — I5042 Chronic combined systolic (congestive) and diastolic (congestive) heart failure: Secondary | ICD-10-CM | POA: Diagnosis not present

## 2017-09-30 NOTE — Telephone Encounter (Signed)
Spoke with Anderson Malta- RN with [facility], she states pt has medi-planner and the pills for this afternoon was missing- she is not sure if pt has recently doubled up on doses. She reports pt was alert and oriented. BP 132/88. Nurse has noticed that his short-term memory is getting worse. [phone]. Aaron Berg

## 2017-10-07 ENCOUNTER — Encounter: Payer: Self-pay | Admitting: Family Medicine

## 2017-10-07 MED ORDER — FOLIC ACID 1 MG PO TABS: 1.0000 mg | ORAL_TABLET | Freq: Every day | ORAL | 3 refills | Status: DC

## 2017-10-08 ENCOUNTER — Telehealth: Payer: Self-pay | Admitting: Family Medicine

## 2017-10-08 DIAGNOSIS — I110 Hypertensive heart disease with heart failure: Secondary | ICD-10-CM | POA: Diagnosis not present

## 2017-10-08 DIAGNOSIS — I5042 Chronic combined systolic (congestive) and diastolic (congestive) heart failure: Secondary | ICD-10-CM | POA: Diagnosis not present

## 2017-10-08 NOTE — Telephone Encounter (Signed)
err

## 2017-10-09 ENCOUNTER — Telehealth: Payer: Self-pay | Admitting: Family Medicine

## 2017-10-09 DIAGNOSIS — F329 Major depressive disorder, single episode, unspecified: Secondary | ICD-10-CM

## 2017-10-09 MED ORDER — DULOXETINE HCL 30 MG PO CPEP: 30.0000 mg | ORAL_CAPSULE | Freq: Every day | ORAL | 1 refills | Status: AC

## 2017-10-09 NOTE — Telephone Encounter (Signed)
Fax refill request from [facility] Duloxetine [address] #90  Requesting new rx 90 day supply

## 2017-10-14 ENCOUNTER — Encounter: Payer: Self-pay | Admitting: Interventional Cardiology

## 2017-10-14 ENCOUNTER — Ambulatory Visit (INDEPENDENT_AMBULATORY_CARE_PROVIDER_SITE_OTHER): Payer: Medicare Other | Admitting: Interventional Cardiology

## 2017-10-14 VITALS — BP 94/60 | HR 73 | Ht 68.0 in | Wt 178.8 lb

## 2017-10-14 DIAGNOSIS — I25709 Atherosclerosis of coronary artery bypass graft(s), unspecified, with unspecified angina pectoris: Secondary | ICD-10-CM

## 2017-10-14 DIAGNOSIS — I5042 Chronic combined systolic (congestive) and diastolic (congestive) heart failure: Secondary | ICD-10-CM

## 2017-10-14 DIAGNOSIS — I10 Essential (primary) hypertension: Secondary | ICD-10-CM

## 2017-10-14 DIAGNOSIS — Z79899 Other long term (current) drug therapy: Secondary | ICD-10-CM | POA: Diagnosis not present

## 2017-10-14 DIAGNOSIS — I2510 Atherosclerotic heart disease of native coronary artery without angina pectoris: Secondary | ICD-10-CM | POA: Diagnosis not present

## 2017-10-14 DIAGNOSIS — I209 Angina pectoris, unspecified: Secondary | ICD-10-CM | POA: Diagnosis not present

## 2017-10-14 DIAGNOSIS — I484 Atypical atrial flutter: Secondary | ICD-10-CM | POA: Diagnosis not present

## 2017-10-14 DIAGNOSIS — I350 Nonrheumatic aortic (valve) stenosis: Secondary | ICD-10-CM | POA: Diagnosis not present

## 2017-10-14 DIAGNOSIS — G4733 Obstructive sleep apnea (adult) (pediatric): Secondary | ICD-10-CM | POA: Diagnosis not present

## 2017-10-14 MED ORDER — AMIODARONE HCL 200 MG PO TABS: 200.0000 mg | ORAL_TABLET | Freq: Every day | ORAL | 3 refills | Status: AC

## 2017-10-14 NOTE — Progress Notes (Signed)
Cardiology Office Note    Date:  10/14/2017   ID:  Aaron Berg, DOB [birth year], MRN [id number]  PCP:  [name], MD  Cardiologist: Sinclair Grooms, MD   Chief Complaint  Patient presents with  . Atrial Fibrillation  . Congestive Heart Failure    History of Present Illness:  Aaron Berg is a 81 y.o. male a history of CAD s/p CABG in 2003, prior cath in 2012 with patent LIMA to LAD, patent SVG to RCA and 50% ostial stenosis of SVG to LCx and EF being 35% at that time. Progressive chronic combined systolic and diastolic heart failure with falling EF over the past 10 months, mild aortic stenosis/mitral regurg by echo in 09/2016, HTN, HLD, pAfib on eliquis, OSA on CPAP, COPD, microvascular dementia, CVA, and falls.  TEE cardioversion September 04, 2017 for atrial flutter with rapid rate aggravating heart failure.  He is currently doing quite well.  He denies dyspnea.  He has had no recurrent increase in heart rate.  He denies orthopnea chest pain.  No syncope but did fall after losing balance while urinating.   Past Medical History:  Diagnosis Date  . Aortic stenosis   . Arthritis    "thumbs, wrists, legs, spine" (05/12/2017)  . Atrial flutter ([place])   . Benign prostatic hyperplasia (BPH) with urinary urgency   . CAD (coronary artery disease)    a. s/p CABG 2003.  [name] Cerebrovascular disease 2006   multiple ischemic changes on prior MRI  . Chronic combined systolic and diastolic CHF (congestive heart failure) ([place])   . Chronic lower back pain   . Complication of anesthesia    "woke up too soon once; had 2nd stroke after right knee replacement"  . COPD (chronic obstructive pulmonary disease) ([place])    "no signs or tests"  . CVA (cerebral vascular accident) ([place]) late 1990s; 01/2014  . Dementia   . Depression   . Dry eyes   . Dyslipidemia    well controlled  . Gait disorder   . Heart murmur    "found in ~ 1946"  . Hypertension   . Hypertensive cardiovascular  disease   . Male hypogonadism   . Migraine    "major one when I was a kid; very painful"  . Mitral regurgitation   . Multiple falls    "after both knees surgeries; went to rehab; came out and fell all the time; I'm much better now" (05/12/2017)  . Obesity   . OSA on CPAP    cpap  . Paroxysmal atrial fibrillation (HCC)   . Skin cancer of anterior chest   . Spinal stenosis     Past Surgical History:  Procedure Laterality Date  . APPENDECTOMY  as child  . CARDIAC CATHETERIZATION  11/2003   Archie Endo 04/29/2011  . CARDIAC CATHETERIZATION N/A 12/03/2016   Procedure: Left Heart Cath and Cors/Grafts Angiography;  Surgeon: Belva Crome, MD;  Location: [facility];  Service: Cardiovascular;  Laterality: N/A;  . CARDIOVERSION N/A 04/26/2014   Procedure: CARDIOVERSION;  Surgeon: Sinclair Grooms, MD;  Location: [facility] ENDOSCOPY;  Service: Cardiovascular;  Laterality: N/A;  . CARDIOVERSION N/A 05/24/2014   Procedure: CARDIOVERSION;  Surgeon: Sinclair Grooms, MD;  Location: [facility] ENDOSCOPY;  Service: Cardiovascular;  Laterality: N/A;  . CARDIOVERSION N/A 09/04/2017   Procedure: CARDIOVERSION;  Surgeon: Jolaine Artist, MD;  Location: [place];  Service: Cardiovascular;  Laterality: N/A;  . CATARACT EXTRACTION W/ INTRAOCULAR  LENS  IMPLANT, BILATERAL Bilateral 2000s  . CORONARY ANGIOPLASTY  1991   "no stents" (10/06/2016)  . CORONARY ARTERY BYPASS GRAFT  11/2002   X 3/notes 04/29/2011  . CYST EXCISION     lumbar; Dr. Joya Salm  . EYE SURGERY Right    detached retina  . JOINT REPLACEMENT    . MOHS SURGERY     on chest  . RETINAL DETACHMENT SURGERY Right   . SHOULDER SURGERY Right    with murphy, reattach ligaments  . TEE WITHOUT CARDIOVERSION N/A 09/04/2017   Procedure: TRANSESOPHAGEAL ECHOCARDIOGRAM (TEE);  Surgeon: Jolaine Artist, MD;  Location: [facility] ENDOSCOPY;  Service: Cardiovascular;  Laterality: N/A;  . TONSILLECTOMY  as child  . TOTAL KNEE ARTHROPLASTY Right 01/02/2014    Procedure: RIGHT TOTAL KNEE ARTHROPLASTY;  Surgeon: Mauri Pole, MD;  Location: [facility];  Service: Orthopedics;  Laterality: Right;  . TOTAL KNEE ARTHROPLASTY Left 01/31/2014   Procedure: LEFT TOTAL KNEE ARTHROPLASTY;  Surgeon: Mauri Pole, MD;  Location: [facility];  Service: Orthopedics;  Laterality: Left;  . TOTAL SHOULDER ARTHROPLASTY Right   . VIDEO ASSISTED THORACOSCOPY (VATS)/THOROCOTOMY  2003   benign nodule    Current Medications: Outpatient Medications Prior to Visit  Medication Sig Dispense Refill  . Artificial Tear Ointment (DRY EYES OP) Place 2 drops into both eyes daily as needed.    . Budesonide (RHINOCORT ALLERGY NA) Place 1 spray into the nose daily.    [name] donepezil (ARICEPT) 23 MG TABS tablet TAKE 1 TABLET AT BEDTIME 30 tablet 0  . DULoxetine (CYMBALTA) 30 MG capsule Take 1 capsule (30 mg total) by mouth daily. 90 capsule 1  . feeding supplement, ENSURE ENLIVE, (ENSURE ENLIVE) LIQD Take 237 mLs by mouth 2 (two) times daily between meals. (Patient taking differently: Take 237 mLs by mouth 2 (two) times daily between meals. boost) 237 mL 12  . finasteride (PROSCAR) 5 MG tablet Take 1 tablet (5 mg total) by mouth daily. 90 tablet 3  . folic acid (FOLVITE) 1 MG tablet Take 1 tablet (1 mg total) by mouth daily. 90 tablet 3  . furosemide (LASIX) 80 MG tablet Take 1.5 tablets (120mg ) by mouth in the morning.  Take 1 tablet (80mg ) by mouth in the evening. 225 tablet 3  . KLOR-CON M20 20 MEQ tablet Take 1 tablet (20 mEq total) by mouth 2 (two) times daily. 180 tablet 3  . Multiple Vitamins-Minerals (MULTIVITAMIN WITH MINERALS) tablet Take 1 tablet by mouth at bedtime. Reported on 01/30/2016    . nitroGLYCERIN (NITROSTAT) 0.4 MG SL tablet Place 1 tablet (0.4 mg total) under the tongue every 5 (five) minutes as needed for chest pain. 25 tablet 12  . polyethylene glycol (MIRALAX / GLYCOLAX) packet Take 17 g by mouth daily.    . rosuvastatin (CRESTOR) 40 MG tablet Take 1 tablet (40 mg  total) by mouth daily. Please keep 06/29/17 appt for future refills. 90 tablet 3  . sodium chloride (OCEAN) 0.65 % SOLN nasal spray Place 1 spray into both nostrils daily as needed for congestion.     . Testosterone 20.25 MG/1.25GM (1.62%) GEL Apply 2 Squirts topically daily as needed (occasionally). One pump per shoulder.    . traMADol (ULTRAM) 50 MG tablet 2 tablets every 8 hours as needed for pain 50 tablet 0  . amiodarone (PACERONE) 400 MG tablet Take 400mg  twice daily for 7 days then 200mg  twice daily (Patient not taking: Reported on 10/14/2017) 180 tablet 1  . ELIQUIS 5  MG TABS tablet TAKE 1 TABLET TWICE A DAY (Patient not taking: Reported on 10/14/2017) 180 tablet 3   No facility-administered medications prior to visit.      Allergies:   Procardia [nifedipine]   Social History   Social History  . Marital status: Divorced    Spouse name: N/A  . Number of children: 2  . Years of education: Masters   Occupational History  .      retired   Social History Main Topics  . Smoking status: Former Smoker    Packs/day: 2.00    Years: 33.00    Types: Cigarettes    Quit date: 06/17/1989  . Smokeless tobacco: Never Used  . Alcohol use 4.8 oz/week    8 Shots of liquor per week     Comment: 05/12/2017 "bourbon" (when he has it--ran out 05/2017)  . Drug use: No  . Sexual activity: Not Currently   Other Topics Concern  . None   Social History Narrative   Patient is single and lives alone.   Patient is retired.   Patient has a Scientist, water quality.   Patient has two adult children.   Patient is right-handed.   Patient drinks one cup of coffee daily.      Lives in [place] community   Retired Engineer, civil (consulting)     Family History:  The patient's family history includes Arthritis in his sister; CAD in his father and mother; Healthy in his sister; Pancreatic cancer in his father.   ROS:   Please see the history of present illness.    Difficulty with balance.  Proximal muscle  weakness.  One fall since I last saw him.  Appetite is improved. All other systems reviewed and are negative.   PHYSICAL EXAM:   VS:  BP 94/60 (BP Location: Right Arm)   Pulse 73   Ht 5\' 8"  (1.727 m)   Wt 178 lb 12.8 oz (81.1 kg)   BMI 27.19 kg/m    GEN: Well nourished, well developed, in no acute distress  HEENT: normal  Neck: no JVD, carotid bruits, or masses Cardiac: RRR; 3/6 crescendo decrescendo systolic murmur of aortic stenosis. Rubs, or gallops,no edema. Respiratory:  clear to auscultation bilaterally, normal work of breathing GI: soft, nontender, nondistended, + BS MS: no deformity or atrophy  Skin: warm and dry, no rash Neuro:  Alert and Oriented x 3, Strength and sensation are intact Psych: euthymic mood, full affect  Wt Readings from Last 3 Encounters:  10/14/17 178 lb 12.8 oz (81.1 kg)  09/14/17 178 lb (80.7 kg)  09/11/17 176 lb 6.4 oz (80 kg)      Studies/Labs Reviewed:   EKG:  EKG not repeated  Recent Labs: 08/04/2017: NT-Pro BNP 5,754 09/03/2017: ALT 17; B Natriuretic Peptide 567.6; Magnesium 2.5; TSH 2.319 09/21/2017: BUN 31; Creatinine, Ser 1.88; Hemoglobin 12.4; Platelets 129; Potassium 3.7; Sodium 139   Lipid Panel    Component Value Date/Time   CHOL 191 01/09/2016 0001   TRIG 251 (H) 01/09/2016 0001   HDL 56 01/09/2016 0001   CHOLHDL 3.4 01/09/2016 0001   VLDL 50 (H) 01/09/2016 0001   LDLCALC 85 01/09/2016 0001    Additional studies/ records that were reviewed today include:  No new data    ASSESSMENT:    1. Chronic combined systolic and diastolic CHF (congestive heart failure) ([place])   2. Essential hypertension, benign   3. Atypical atrial flutter ([place])   4. Coronary artery disease involving coronary bypass graft of native  heart with angina pectoris ([place])   5. Nonrheumatic aortic valve stenosis   6. OSA (obstructive sleep apnea)   7. On amiodarone therapy      PLAN:  In order of problems listed above:  1. No evidence of volume  overload/heart failure. 2. Blood pressure is relatively low.  No evidence of volume depletion. 3. No recurrence on intensified amiodarone regimen.  Decrease amiodarone to 200 mg daily. 4. Asymptomatic 5. Recently documented to be moderate. 6. Not addressed 7. Decrease amiodarone to 200 mg/day.  Liver and TSH in 5 months.  Check basic metabolic panel and EKG on return.  Call if dyspnea or syncope.    Medication Adjustments/Labs and Tests Ordered: Current medicines are reviewed at length with the patient today.  Concerns regarding medicines are outlined above.  Medication changes, Labs and Tests ordered today are listed in the Patient Instructions below. Patient Instructions  Medication Instructions:  1) DECREASE Amiodarone to 200mg  once daily  Labwork: Your physician recommends that you return for lab work in: March 2019 (Lipid and liver- please make sure you have nothing to eat or drink after midnight the night before)   Testing/Procedures: None  Follow-Up: Your physician recommends that you schedule a follow-up appointment in: 6-7 weeks with Dr. Tamala Julian with an EKG.    Any Other Special Instructions Will Be Listed Below (If Applicable).     If you need a refill on your cardiac medications before your next appointment, please call your pharmacy.      Signed, Sinclair Grooms, MD  10/14/2017 3:48 PM    [facility] [place], [place], [place]  [address] Phone: [phone]; Fax: [phone]

## 2017-10-14 NOTE — Patient Instructions (Signed)
Medication Instructions:  1) DECREASE Amiodarone to 200mg  once daily  Labwork: Your physician recommends that you return for lab work in: March 2019 (Lipid and liver- please make sure you have nothing to eat or drink after midnight the night before)   Testing/Procedures: None  Follow-Up: Your physician recommends that you schedule a follow-up appointment in: 6-7 weeks with Dr. Tamala Julian with an EKG.    Any Other Special Instructions Will Be Listed Below (If Applicable).     If you need a refill on your cardiac medications before your next appointment, please call your pharmacy.

## 2017-10-15 DIAGNOSIS — I110 Hypertensive heart disease with heart failure: Secondary | ICD-10-CM | POA: Diagnosis not present

## 2017-10-15 DIAGNOSIS — I5042 Chronic combined systolic (congestive) and diastolic (congestive) heart failure: Secondary | ICD-10-CM | POA: Diagnosis not present

## 2017-10-16 ENCOUNTER — Telehealth: Payer: Self-pay | Admitting: Family Medicine

## 2017-10-16 NOTE — Telephone Encounter (Signed)
Aaron Berg with [facility] t# [phone] and they are discharging pt because he has met the goal of medication education and he now has a community nurse that comes twice a day to give pt his meds.

## 2017-10-19 IMAGING — MR MR HEAD W/O CM
10 series · 48 of 48 positions shown · non-contrast
Comparison: MRI brain 06/06/2005.

CLINICAL DATA: Late onset Alzheimer's disease without behavioral
disturbance. Delirium, drug-induced. Obstructive sleep apnea.
Tremor. Imbalance and memory disorder.

EXAM:
MRI HEAD WITHOUT CONTRAST
TECHNIQUE: Multiplanar, multiecho pulse sequences of the brain and surrounding
structures were obtained without intravenous contrast.

[Series 2: t1_se_sag · sagittal · 5.0mm · 0.45mm/px · 2 of 21 slices shown]
[im 1/21]
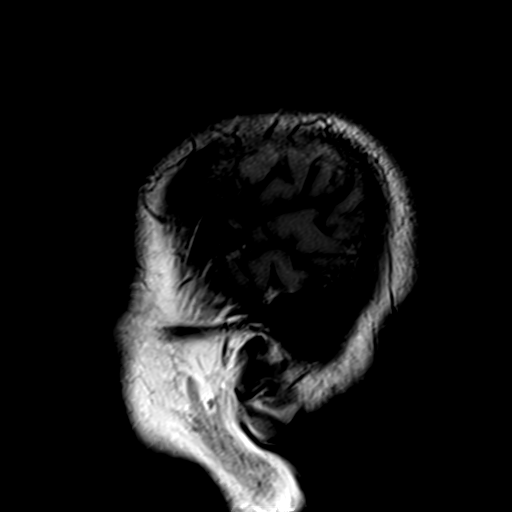
[im 21/21]
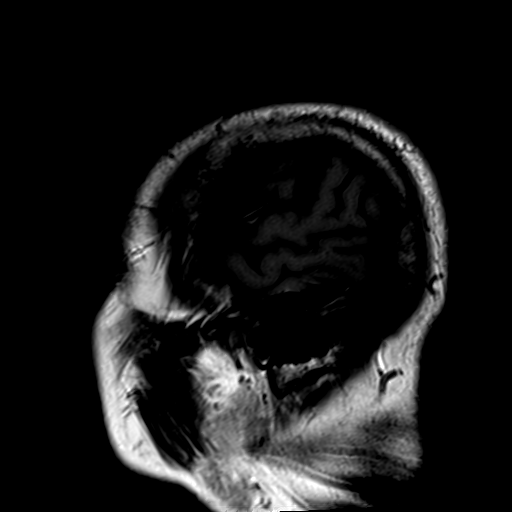

[Series 3: ep2d_diff_(id)_trace · axial · 3.0mm · 1.80mm/px · z∈[-38,+108]mm · 10 of 96 slices shown]
[im 1/96]
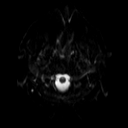
[im 11/96]
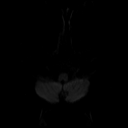
[im 22/96]
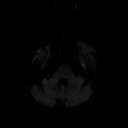
[im 32/96]
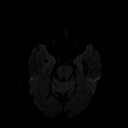
[im 43/96]
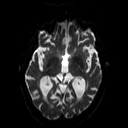
[im 53/96]
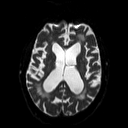
[im 64/96]
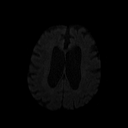
[im 74/96]
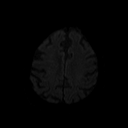
[im 85/96]
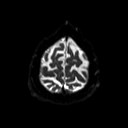
[im 96/96]
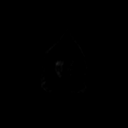

[Series 4: ep2d_diff_(id)_trace_adc · axial · 3.0mm · 1.80mm/px · z∈[-38,+108]mm · 5 of 50 slices shown]
[im 1/50]
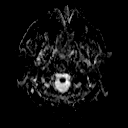
[im 13/50]
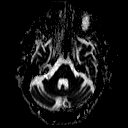
[im 25/50]
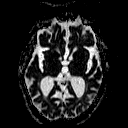
[im 37/50]
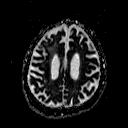
[im 50/50]
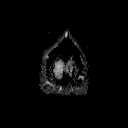

[Series 5: ep2d_diff_cor · coronal · 5.0mm · 1.77mm/px · 5 of 52 slices shown]
[im 1/52]
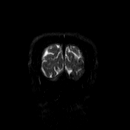
[im 13/52]
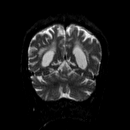
[im 26/52]
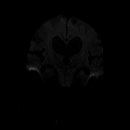
[im 39/52]
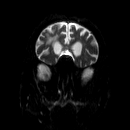
[im 52/52]
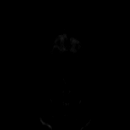

[Series 6: ep2d_diff_cor_adc · coronal · 5.0mm · 1.77mm/px · 3 of 26 slices shown]
[im 1/26]
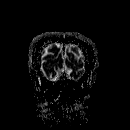
[im 13/26]
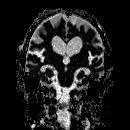
[im 26/26]
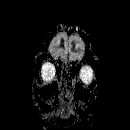

[Series 8: swi_images · axial · 2.0mm · 0.90mm/px · z∈[-44,+113]mm · 8 of 80 slices shown]
[im 1/80]
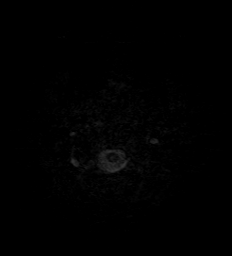
[im 12/80]
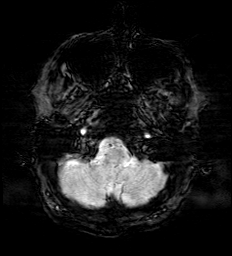
[im 23/80]
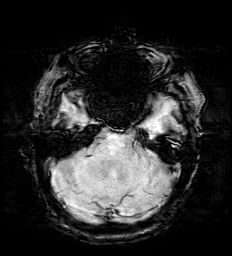
[im 34/80]
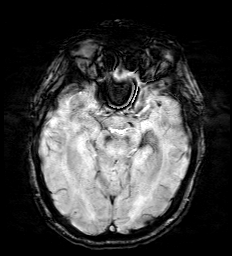
[im 46/80]
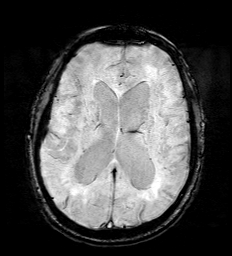
[im 57/80]
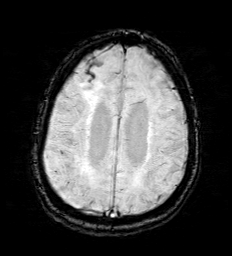
[im 68/80]
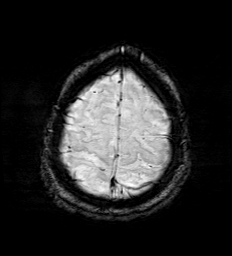
[im 80/80]
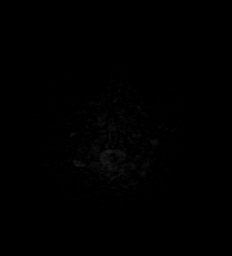

[Series 9: FLAIR · axial · 5.0mm · 0.45mm/px · z∈[-37,+106]mm · 2 of 24 slices shown]
[im 1/24]
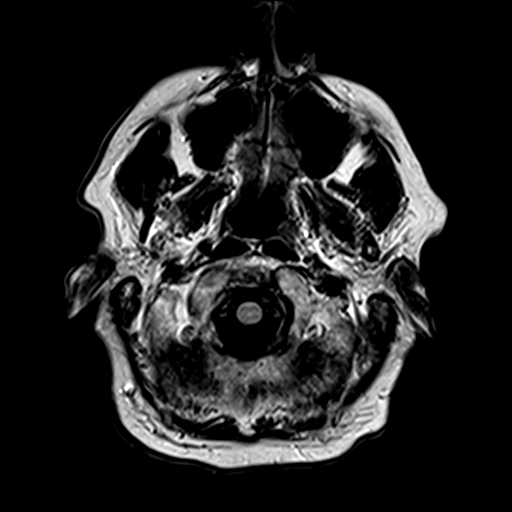
[im 24/24]
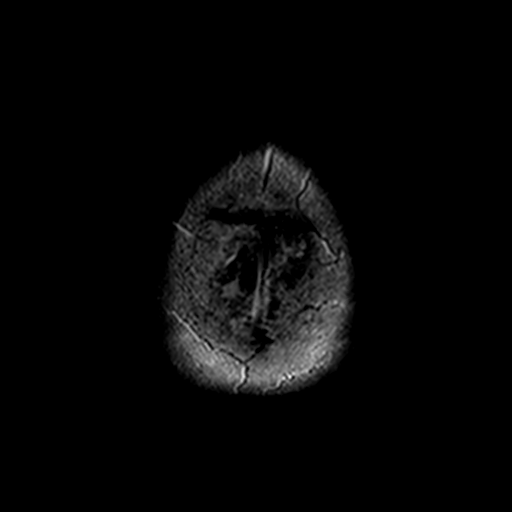

[Series 10: t2_tse_tra_512 · axial · 5.0mm · 0.60mm/px · z∈[-37,+106]mm · 2 of 24 slices shown]
[im 1/24]
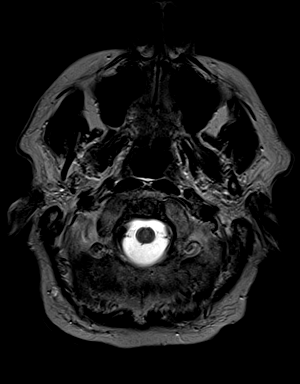
[im 24/24]
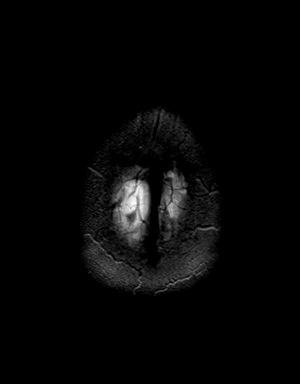

[Series 11: t1_mpr_tra · axial · 2.0mm · 0.45mm/px · z∈[-44,+113]mm · 8 of 80 slices shown]
[im 1/80]
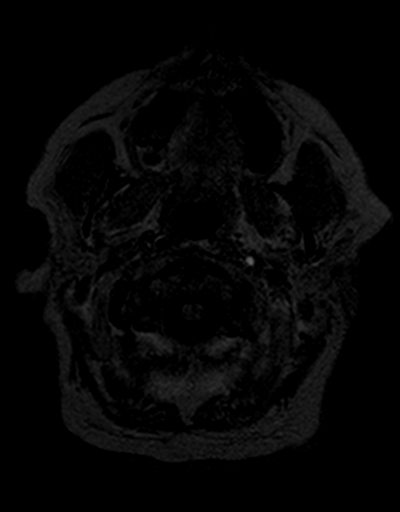
[im 12/80]
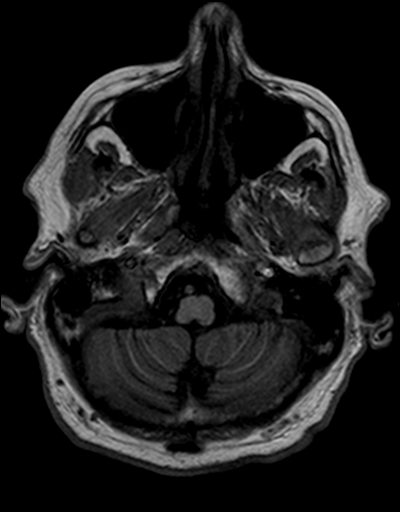
[im 23/80]
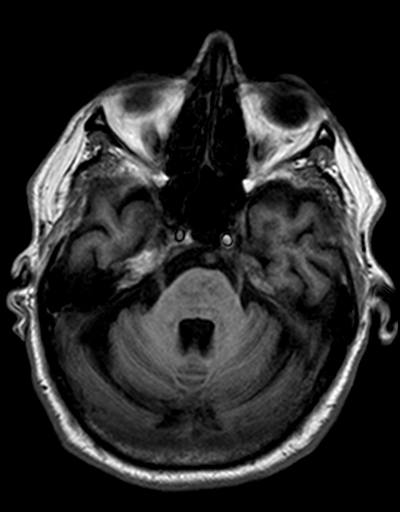
[im 34/80]
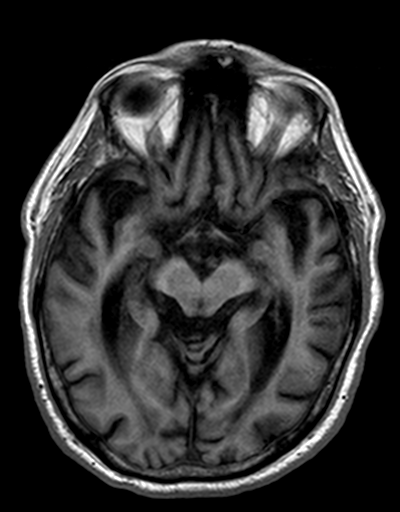
[im 46/80]
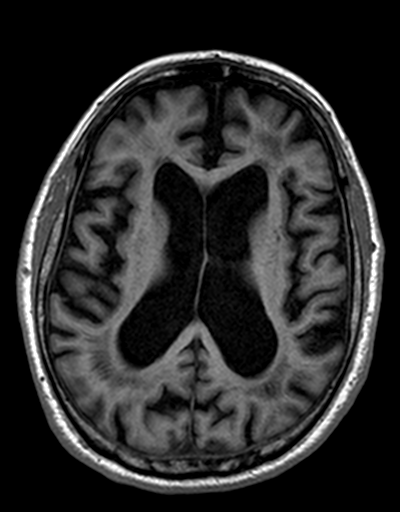
[im 57/80]
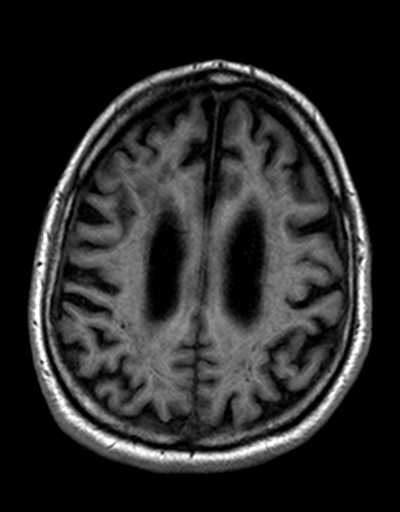
[im 68/80]
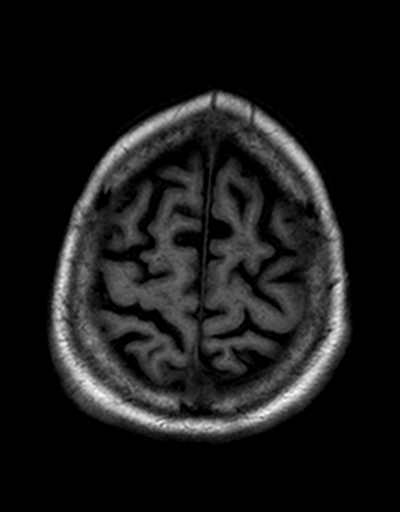
[im 80/80]
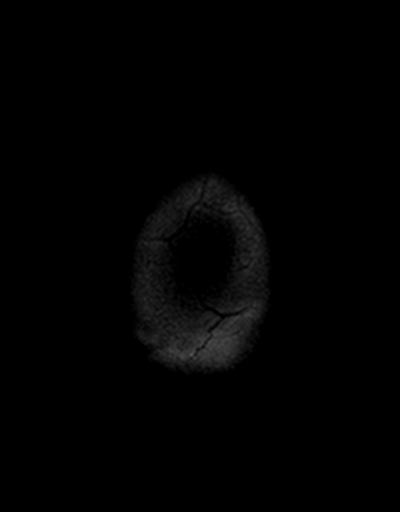

[Series 12: T2 · coronal · 5.0mm · 0.45mm/px · 3 of 26 slices shown]
[im 1/26]
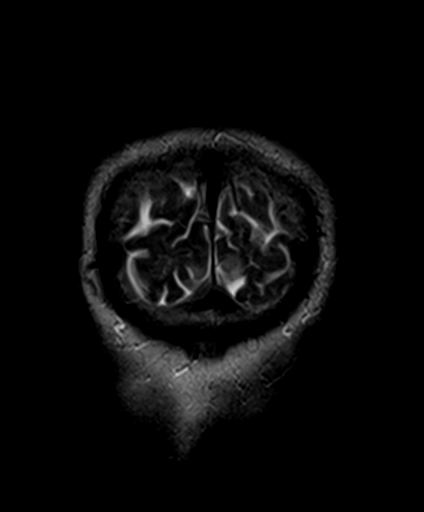
[im 13/26]
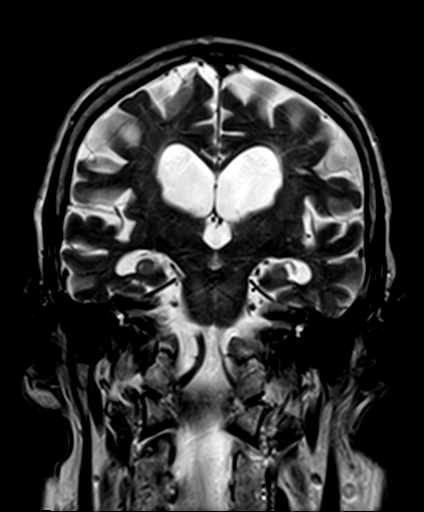
[im 26/26]
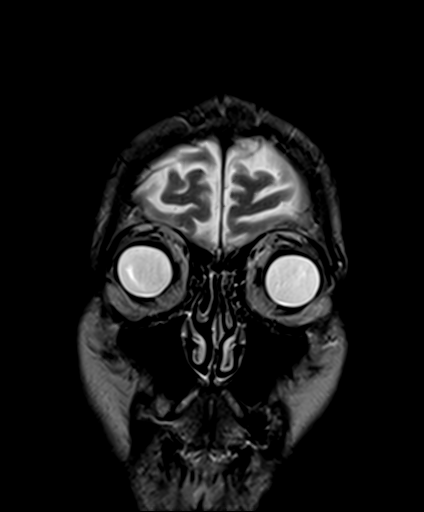

[48 of 48 positions shown; findings below may reference images not displayed]

FINDINGS: Progressive cerebral and cerebellar atrophy is present. Progressive
moderate periventricular and scattered subcortical T2 changes are
evident bilaterally. A remote anterior frontal lobe infarct is new
since the prior study. No acute ischemic changes are present.

No acute hemorrhage or mass lesion is present. The ventricles are
proportionate to the degree of atrophy.

The internal auditory canals are within normal limits bilaterally.
Flow is present in the major intracranial arteries. Bilateral lens
replacements are present. Paranasal sinuses and the mastoid air
cells are clear. The paranasal sinuses are clear. Minimal fluid is
present in the inferior left mastoid air cells. No obstructing
nasopharyngeal lesion is present.

Skullbase is within normal limits. Midline sagittal images are
unremarkable.
IMPRESSION: 1. Progressive moderate diffuse cerebral and cerebellar atrophy
somewhat disproportionate in the temporal and parietal lobes.
2. Progressive moderate periventricular white matter disease
bilaterally. This likely reflects the sequela of chronic
microvascular ischemia.
3. Nonhemorrhagic cortical infarct involving the anterior left
frontal lobe is new since 2664, but not acute.
4. No acute intracranial abnormality.

## 2017-10-26 ENCOUNTER — Other Ambulatory Visit: Payer: Self-pay | Admitting: Orthopedic Surgery

## 2017-10-26 DIAGNOSIS — S46312A Strain of muscle, fascia and tendon of triceps, left arm, initial encounter: Secondary | ICD-10-CM | POA: Diagnosis not present

## 2017-10-27 DIAGNOSIS — M545 Low back pain: Secondary | ICD-10-CM | POA: Diagnosis not present

## 2017-10-27 DIAGNOSIS — M6281 Muscle weakness (generalized): Secondary | ICD-10-CM | POA: Diagnosis not present

## 2017-10-27 DIAGNOSIS — R2681 Unsteadiness on feet: Secondary | ICD-10-CM | POA: Diagnosis not present

## 2017-10-27 DIAGNOSIS — Z9181 History of falling: Secondary | ICD-10-CM | POA: Diagnosis not present

## 2017-10-27 DIAGNOSIS — R278 Other lack of coordination: Secondary | ICD-10-CM | POA: Diagnosis not present

## 2017-10-28 DIAGNOSIS — M6281 Muscle weakness (generalized): Secondary | ICD-10-CM | POA: Diagnosis not present

## 2017-10-28 DIAGNOSIS — Z9181 History of falling: Secondary | ICD-10-CM | POA: Diagnosis not present

## 2017-10-28 DIAGNOSIS — R2681 Unsteadiness on feet: Secondary | ICD-10-CM | POA: Diagnosis not present

## 2017-10-28 DIAGNOSIS — M545 Low back pain: Secondary | ICD-10-CM | POA: Diagnosis not present

## 2017-10-28 DIAGNOSIS — R278 Other lack of coordination: Secondary | ICD-10-CM | POA: Diagnosis not present

## 2017-10-29 DIAGNOSIS — Z9181 History of falling: Secondary | ICD-10-CM | POA: Diagnosis not present

## 2017-10-29 DIAGNOSIS — M545 Low back pain: Secondary | ICD-10-CM | POA: Diagnosis not present

## 2017-10-29 DIAGNOSIS — R2681 Unsteadiness on feet: Secondary | ICD-10-CM | POA: Diagnosis not present

## 2017-10-29 DIAGNOSIS — R278 Other lack of coordination: Secondary | ICD-10-CM | POA: Diagnosis not present

## 2017-10-29 DIAGNOSIS — M6281 Muscle weakness (generalized): Secondary | ICD-10-CM | POA: Diagnosis not present

## 2017-10-30 DIAGNOSIS — R278 Other lack of coordination: Secondary | ICD-10-CM | POA: Diagnosis not present

## 2017-10-30 DIAGNOSIS — M545 Low back pain: Secondary | ICD-10-CM | POA: Diagnosis not present

## 2017-10-30 DIAGNOSIS — R2681 Unsteadiness on feet: Secondary | ICD-10-CM | POA: Diagnosis not present

## 2017-10-30 DIAGNOSIS — M6281 Muscle weakness (generalized): Secondary | ICD-10-CM | POA: Diagnosis not present

## 2017-10-30 DIAGNOSIS — Z9181 History of falling: Secondary | ICD-10-CM | POA: Diagnosis not present

## 2017-11-02 ENCOUNTER — Other Ambulatory Visit: Payer: Self-pay | Admitting: Orthopedic Surgery

## 2017-11-02 ENCOUNTER — Other Ambulatory Visit: Payer: Self-pay | Admitting: Family Medicine

## 2017-11-02 DIAGNOSIS — M545 Low back pain: Secondary | ICD-10-CM | POA: Diagnosis not present

## 2017-11-02 DIAGNOSIS — M25522 Pain in left elbow: Secondary | ICD-10-CM

## 2017-11-02 DIAGNOSIS — R278 Other lack of coordination: Secondary | ICD-10-CM | POA: Diagnosis not present

## 2017-11-02 DIAGNOSIS — R2681 Unsteadiness on feet: Secondary | ICD-10-CM | POA: Diagnosis not present

## 2017-11-02 DIAGNOSIS — Z9181 History of falling: Secondary | ICD-10-CM | POA: Diagnosis not present

## 2017-11-02 DIAGNOSIS — M6281 Muscle weakness (generalized): Secondary | ICD-10-CM | POA: Diagnosis not present

## 2017-11-03 DIAGNOSIS — M545 Low back pain: Secondary | ICD-10-CM | POA: Diagnosis not present

## 2017-11-03 DIAGNOSIS — M6281 Muscle weakness (generalized): Secondary | ICD-10-CM | POA: Diagnosis not present

## 2017-11-03 DIAGNOSIS — R2681 Unsteadiness on feet: Secondary | ICD-10-CM | POA: Diagnosis not present

## 2017-11-03 DIAGNOSIS — R278 Other lack of coordination: Secondary | ICD-10-CM | POA: Diagnosis not present

## 2017-11-03 DIAGNOSIS — Z9181 History of falling: Secondary | ICD-10-CM | POA: Diagnosis not present

## 2017-11-03 NOTE — Telephone Encounter (Signed)
Ok to refill?   My Chart request. Aaron Berg

## 2017-11-03 NOTE — Telephone Encounter (Signed)
Aaron Berg states she reports that she requested this, and pt told his daughter that he needed some and had some old testosterone gel that he has been using. Aaron Berg

## 2017-11-03 NOTE — Telephone Encounter (Signed)
He has not had this in 3 years so do not know why he wants it now

## 2017-11-04 ENCOUNTER — Ambulatory Visit
Admission: RE | Admit: 2017-11-04 | Discharge: 2017-11-04 | Disposition: A | Discharge status: Home / Self Care | Payer: Medicare Other | Source: Ambulatory Visit | Attending: Orthopedic Surgery | Admitting: Orthopedic Surgery

## 2017-11-04 DIAGNOSIS — M545 Low back pain: Secondary | ICD-10-CM | POA: Diagnosis not present

## 2017-11-04 DIAGNOSIS — Z9181 History of falling: Secondary | ICD-10-CM | POA: Diagnosis not present

## 2017-11-04 DIAGNOSIS — M25522 Pain in left elbow: Secondary | ICD-10-CM

## 2017-11-04 DIAGNOSIS — R278 Other lack of coordination: Secondary | ICD-10-CM | POA: Diagnosis not present

## 2017-11-04 DIAGNOSIS — M6281 Muscle weakness (generalized): Secondary | ICD-10-CM | POA: Diagnosis not present

## 2017-11-04 DIAGNOSIS — R2681 Unsteadiness on feet: Secondary | ICD-10-CM | POA: Diagnosis not present

## 2017-11-04 NOTE — Telephone Encounter (Signed)
LM on Aaron Berg's phone that rx was denied. [name]

## 2017-11-07 DIAGNOSIS — M6281 Muscle weakness (generalized): Secondary | ICD-10-CM | POA: Diagnosis not present

## 2017-11-07 DIAGNOSIS — R278 Other lack of coordination: Secondary | ICD-10-CM | POA: Diagnosis not present

## 2017-11-07 DIAGNOSIS — M545 Low back pain: Secondary | ICD-10-CM | POA: Diagnosis not present

## 2017-11-07 DIAGNOSIS — Z9181 History of falling: Secondary | ICD-10-CM | POA: Diagnosis not present

## 2017-11-07 DIAGNOSIS — R2681 Unsteadiness on feet: Secondary | ICD-10-CM | POA: Diagnosis not present

## 2017-11-09 DIAGNOSIS — R2681 Unsteadiness on feet: Secondary | ICD-10-CM | POA: Diagnosis not present

## 2017-11-09 DIAGNOSIS — R278 Other lack of coordination: Secondary | ICD-10-CM | POA: Diagnosis not present

## 2017-11-09 DIAGNOSIS — Z9181 History of falling: Secondary | ICD-10-CM | POA: Diagnosis not present

## 2017-11-09 DIAGNOSIS — M545 Low back pain: Secondary | ICD-10-CM | POA: Diagnosis not present

## 2017-11-09 DIAGNOSIS — M6281 Muscle weakness (generalized): Secondary | ICD-10-CM | POA: Diagnosis not present

## 2017-11-10 DIAGNOSIS — M6281 Muscle weakness (generalized): Secondary | ICD-10-CM | POA: Diagnosis not present

## 2017-11-10 DIAGNOSIS — M545 Low back pain: Secondary | ICD-10-CM | POA: Diagnosis not present

## 2017-11-10 DIAGNOSIS — R2681 Unsteadiness on feet: Secondary | ICD-10-CM | POA: Diagnosis not present

## 2017-11-10 DIAGNOSIS — R278 Other lack of coordination: Secondary | ICD-10-CM | POA: Diagnosis not present

## 2017-11-10 DIAGNOSIS — Z9181 History of falling: Secondary | ICD-10-CM | POA: Diagnosis not present

## 2017-11-11 DIAGNOSIS — M6281 Muscle weakness (generalized): Secondary | ICD-10-CM | POA: Diagnosis not present

## 2017-11-11 DIAGNOSIS — Z9181 History of falling: Secondary | ICD-10-CM | POA: Diagnosis not present

## 2017-11-11 DIAGNOSIS — R278 Other lack of coordination: Secondary | ICD-10-CM | POA: Diagnosis not present

## 2017-11-11 DIAGNOSIS — R2681 Unsteadiness on feet: Secondary | ICD-10-CM | POA: Diagnosis not present

## 2017-11-11 DIAGNOSIS — M545 Low back pain: Secondary | ICD-10-CM | POA: Diagnosis not present

## 2017-11-12 DIAGNOSIS — S46312D Strain of muscle, fascia and tendon of triceps, left arm, subsequent encounter: Secondary | ICD-10-CM | POA: Diagnosis not present

## 2017-11-13 DIAGNOSIS — M545 Low back pain: Secondary | ICD-10-CM | POA: Diagnosis not present

## 2017-11-13 DIAGNOSIS — Z9181 History of falling: Secondary | ICD-10-CM | POA: Diagnosis not present

## 2017-11-13 DIAGNOSIS — R2681 Unsteadiness on feet: Secondary | ICD-10-CM | POA: Diagnosis not present

## 2017-11-13 DIAGNOSIS — M6281 Muscle weakness (generalized): Secondary | ICD-10-CM | POA: Diagnosis not present

## 2017-11-13 DIAGNOSIS — R278 Other lack of coordination: Secondary | ICD-10-CM | POA: Diagnosis not present

## 2017-11-16 DIAGNOSIS — Z9181 History of falling: Secondary | ICD-10-CM | POA: Diagnosis not present

## 2017-11-16 DIAGNOSIS — M6281 Muscle weakness (generalized): Secondary | ICD-10-CM | POA: Diagnosis not present

## 2017-11-16 DIAGNOSIS — M545 Low back pain: Secondary | ICD-10-CM | POA: Diagnosis not present

## 2017-11-16 DIAGNOSIS — R278 Other lack of coordination: Secondary | ICD-10-CM | POA: Diagnosis not present

## 2017-11-16 DIAGNOSIS — R2681 Unsteadiness on feet: Secondary | ICD-10-CM | POA: Diagnosis not present

## 2017-11-18 DIAGNOSIS — Z9181 History of falling: Secondary | ICD-10-CM | POA: Diagnosis not present

## 2017-11-18 DIAGNOSIS — M6281 Muscle weakness (generalized): Secondary | ICD-10-CM | POA: Diagnosis not present

## 2017-11-18 DIAGNOSIS — M545 Low back pain: Secondary | ICD-10-CM | POA: Diagnosis not present

## 2017-11-18 DIAGNOSIS — R2681 Unsteadiness on feet: Secondary | ICD-10-CM | POA: Diagnosis not present

## 2017-11-18 DIAGNOSIS — R278 Other lack of coordination: Secondary | ICD-10-CM | POA: Diagnosis not present

## 2017-11-19 DIAGNOSIS — R278 Other lack of coordination: Secondary | ICD-10-CM | POA: Diagnosis not present

## 2017-11-19 DIAGNOSIS — R2681 Unsteadiness on feet: Secondary | ICD-10-CM | POA: Diagnosis not present

## 2017-11-19 DIAGNOSIS — M545 Low back pain: Secondary | ICD-10-CM | POA: Diagnosis not present

## 2017-11-19 DIAGNOSIS — M6281 Muscle weakness (generalized): Secondary | ICD-10-CM | POA: Diagnosis not present

## 2017-11-19 DIAGNOSIS — Z9181 History of falling: Secondary | ICD-10-CM | POA: Diagnosis not present

## 2017-11-20 DIAGNOSIS — M6281 Muscle weakness (generalized): Secondary | ICD-10-CM | POA: Diagnosis not present

## 2017-11-20 DIAGNOSIS — Z9181 History of falling: Secondary | ICD-10-CM | POA: Diagnosis not present

## 2017-11-20 DIAGNOSIS — R2681 Unsteadiness on feet: Secondary | ICD-10-CM | POA: Diagnosis not present

## 2017-11-20 DIAGNOSIS — M545 Low back pain: Secondary | ICD-10-CM | POA: Diagnosis not present

## 2017-11-20 DIAGNOSIS — R278 Other lack of coordination: Secondary | ICD-10-CM | POA: Diagnosis not present

## 2017-11-24 DIAGNOSIS — M545 Low back pain: Secondary | ICD-10-CM | POA: Diagnosis not present

## 2017-11-24 DIAGNOSIS — Z9181 History of falling: Secondary | ICD-10-CM | POA: Diagnosis not present

## 2017-11-24 DIAGNOSIS — R278 Other lack of coordination: Secondary | ICD-10-CM | POA: Diagnosis not present

## 2017-11-24 DIAGNOSIS — M6281 Muscle weakness (generalized): Secondary | ICD-10-CM | POA: Diagnosis not present

## 2017-11-24 DIAGNOSIS — R2681 Unsteadiness on feet: Secondary | ICD-10-CM | POA: Diagnosis not present

## 2017-11-26 DIAGNOSIS — R2681 Unsteadiness on feet: Secondary | ICD-10-CM | POA: Diagnosis not present

## 2017-11-26 DIAGNOSIS — Z9181 History of falling: Secondary | ICD-10-CM | POA: Diagnosis not present

## 2017-11-26 DIAGNOSIS — R278 Other lack of coordination: Secondary | ICD-10-CM | POA: Diagnosis not present

## 2017-11-26 DIAGNOSIS — M6281 Muscle weakness (generalized): Secondary | ICD-10-CM | POA: Diagnosis not present

## 2017-11-26 DIAGNOSIS — M545 Low back pain: Secondary | ICD-10-CM | POA: Diagnosis not present

## 2017-11-27 DIAGNOSIS — R278 Other lack of coordination: Secondary | ICD-10-CM | POA: Diagnosis not present

## 2017-11-27 DIAGNOSIS — Z9181 History of falling: Secondary | ICD-10-CM | POA: Diagnosis not present

## 2017-11-27 DIAGNOSIS — R2681 Unsteadiness on feet: Secondary | ICD-10-CM | POA: Diagnosis not present

## 2017-11-27 DIAGNOSIS — M6281 Muscle weakness (generalized): Secondary | ICD-10-CM | POA: Diagnosis not present

## 2017-11-27 DIAGNOSIS — M545 Low back pain: Secondary | ICD-10-CM | POA: Diagnosis not present

## 2017-11-30 NOTE — Progress Notes (Signed)
Cardiology Office Note    Date:  12/01/2017   ID:  Aaron Berg, DOB 03-05-1936, MRN [id number]  PCP:  [name], MD  Cardiologist: Sinclair Grooms, MD   Chief Complaint  Patient presents with  . Coronary Artery Disease  . Congestive Heart Failure    History of Present Illness:  Aaron Berg is a 81 y.o. male a history of CAD s/p CABG in 2003, prior cath in 2012 with patent LIMA to LAD, patent SVG to RCA and 50% ostial stenosis of SVG to LCx and EF being 35% at that time. Progressive chronic combined systolic and diastolic heart failure with falling EF over the past 10 months, mild aortic stenosis/mitral regurg by echo in 09/2016, HTN, HLD, pAfib on eliquis, OSA on CPAP, COPD, microvascular dementia, CVA, and falls.  TEE cardioversion September 04, 2017 for atrial flutter with rapid rate aggravating heart failure.   Lethargy with increased amount of time spent sleeping.  Feels the is tired all the time.  Denies orthopnea.  Minimal if any episodes of chest pain.  No lower extremity swelling.  Denies orthopnea.  Has not needed to use nitroglycerin.  He has not had palpitations.   Past Medical History:  Diagnosis Date  . Aortic stenosis   . Arthritis    "thumbs, wrists, legs, spine" (05/12/2017)  . Atrial flutter ([place])   . Benign prostatic hyperplasia (BPH) with urinary urgency   . CAD (coronary artery disease)    a. s/p CABG 2003.  [name] Cerebrovascular disease 2006   multiple ischemic changes on prior MRI  . Chronic combined systolic and diastolic CHF (congestive heart failure) ([place])   . Chronic lower back pain   . Complication of anesthesia    "woke up too soon once; had 2nd stroke after right knee replacement"  . COPD (chronic obstructive pulmonary disease) ([place])    "no signs or tests"  . CVA (cerebral vascular accident) ([place]) late 1990s; 01/2014  . Dementia   . Depression   . Dry eyes   . Dyslipidemia    well controlled  . Gait disorder   . Heart murmur    "found in ~ 1946"  . Hypertension   . Hypertensive cardiovascular disease   . Male hypogonadism   . Migraine    "major one when I was a kid; very painful"  . Mitral regurgitation   . Multiple falls    "after both knees surgeries; went to rehab; came out and fell all the time; I'm much better now" (05/12/2017)  . Obesity   . OSA on CPAP    cpap  . Paroxysmal atrial fibrillation (HCC)   . Skin cancer of anterior chest   . Spinal stenosis     Past Surgical History:  Procedure Laterality Date  . APPENDECTOMY  as child  . CARDIAC CATHETERIZATION  11/2003   Archie Endo 04/29/2011  . CARDIAC CATHETERIZATION N/A 12/03/2016   Procedure: Left Heart Cath and Cors/Grafts Angiography;  Surgeon: Belva Crome, MD;  Location: [facility];  Service: Cardiovascular;  Laterality: N/A;  . CARDIOVERSION N/A 04/26/2014   Procedure: CARDIOVERSION;  Surgeon: Sinclair Grooms, MD;  Location: [facility] ENDOSCOPY;  Service: Cardiovascular;  Laterality: N/A;  . CARDIOVERSION N/A 05/24/2014   Procedure: CARDIOVERSION;  Surgeon: Sinclair Grooms, MD;  Location: [facility] ENDOSCOPY;  Service: Cardiovascular;  Laterality: N/A;  . CARDIOVERSION N/A 09/04/2017   Procedure: CARDIOVERSION;  Surgeon: Jolaine Artist, MD;  Location: [facility];  Service: Cardiovascular;  Laterality: N/A;  . CATARACT EXTRACTION W/ INTRAOCULAR LENS  IMPLANT, BILATERAL Bilateral 2000s  . CORONARY ANGIOPLASTY  1991   "no stents" (10/06/2016)  . CORONARY ARTERY BYPASS GRAFT  11/2002   X 3/notes 04/29/2011  . CYST EXCISION     lumbar; Dr. Joya Salm  . EYE SURGERY Right    detached retina  . JOINT REPLACEMENT    . MOHS SURGERY     on chest  . RETINAL DETACHMENT SURGERY Right   . SHOULDER SURGERY Right    with murphy, reattach ligaments  . TEE WITHOUT CARDIOVERSION N/A 09/04/2017   Procedure: TRANSESOPHAGEAL ECHOCARDIOGRAM (TEE);  Surgeon: Jolaine Artist, MD;  Location: [facility] ENDOSCOPY;  Service: Cardiovascular;  Laterality: N/A;  .  TONSILLECTOMY  as child  . TOTAL KNEE ARTHROPLASTY Right 01/02/2014   Procedure: RIGHT TOTAL KNEE ARTHROPLASTY;  Surgeon: Mauri Pole, MD;  Location: [facility];  Service: Orthopedics;  Laterality: Right;  . TOTAL KNEE ARTHROPLASTY Left 01/31/2014   Procedure: LEFT TOTAL KNEE ARTHROPLASTY;  Surgeon: Mauri Pole, MD;  Location: [facility];  Service: Orthopedics;  Laterality: Left;  . TOTAL SHOULDER ARTHROPLASTY Right   . VIDEO ASSISTED THORACOSCOPY (VATS)/THOROCOTOMY  2003   benign nodule    Current Medications: Outpatient Medications Prior to Visit  Medication Sig Dispense Refill  . acetaminophen (TYLENOL) 500 MG tablet Take 500 mg by mouth 3 (three) times daily as needed for mild pain or moderate pain.    [name] amiodarone (PACERONE) 200 MG tablet Take 1 tablet (200 mg total) by mouth daily. 90 tablet 3  . apixaban (ELIQUIS) 5 MG TABS tablet Take 5 mg by mouth 2 (two) times daily.    . Artificial Tear Ointment (DRY EYES OP) Place 2 drops into both eyes daily as needed.    . donepezil (ARICEPT) 23 MG TABS tablet TAKE 1 TABLET AT BEDTIME 30 tablet 0  . DULoxetine (CYMBALTA) 30 MG capsule Take 1 capsule (30 mg total) by mouth daily. 90 capsule 1  . feeding supplement, ENSURE ENLIVE, (ENSURE ENLIVE) LIQD Take 237 mLs by mouth 2 (two) times daily between meals. (Patient taking differently: Take 237 mLs by mouth 2 (two) times daily between meals. boost) 237 mL 12  . finasteride (PROSCAR) 5 MG tablet Take 1 tablet (5 mg total) by mouth daily. 90 tablet 3  . folic acid (FOLVITE) 1 MG tablet Take 1 tablet (1 mg total) by mouth daily. 90 tablet 3  . furosemide (LASIX) 80 MG tablet Take 1.5 tablets (120mg ) by mouth in the morning.  Take 1 tablet (80mg ) by mouth in the evening. 225 tablet 3  . KLOR-CON M20 20 MEQ tablet Take 1 tablet (20 mEq total) by mouth 2 (two) times daily. 180 tablet 3  . Multiple Vitamins-Minerals (MULTIVITAMIN WITH MINERALS) tablet Take 1 tablet by mouth at bedtime. Reported on 01/30/2016     . nitroGLYCERIN (NITROSTAT) 0.4 MG SL tablet Place 1 tablet (0.4 mg total) under the tongue every 5 (five) minutes as needed for chest pain. 25 tablet 12  . polyethylene glycol (MIRALAX / GLYCOLAX) packet Take 17 g by mouth daily.    . rosuvastatin (CRESTOR) 40 MG tablet Take 1 tablet (40 mg total) by mouth daily. Please keep 06/29/17 appt for future refills. 90 tablet 3  . sodium chloride (OCEAN) 0.65 % SOLN nasal spray Place 1 spray into both nostrils daily as needed for congestion.     . Budesonide (RHINOCORT ALLERGY NA) Place 1 spray into the  nose daily.    . Testosterone 20.25 MG/1.25GM (1.62%) GEL Apply 2 Squirts topically daily as needed (occasionally). One pump per shoulder.    . traMADol (ULTRAM) 50 MG tablet 2 tablets every 8 hours as needed for pain (Patient not taking: Reported on 12/01/2017) 50 tablet 0   No facility-administered medications prior to visit.      Allergies:   Procardia [nifedipine]   Social History   Socioeconomic History  . Marital status: Divorced    Spouse name: None  . Number of children: 2  . Years of education: Masters  . Highest education level: None  Social Needs  . Financial resource strain: None  . Food insecurity - worry: None  . Food insecurity - inability: None  . Transportation needs - medical: None  . Transportation needs - non-medical: None  Occupational History    Comment: retired  Tobacco Use  . Smoking status: Former Smoker    Packs/day: 2.00    Years: 33.00    Pack years: 66.00    Types: Cigarettes    Last attempt to quit: 06/17/1989    Years since quitting: 28.4  . Smokeless tobacco: Never Used  Substance and Sexual Activity  . Alcohol use: Yes    Alcohol/week: 4.8 oz    Types: 8 Shots of liquor per week    Comment: 05/12/2017 "bourbon" (when he has it--ran out 05/2017)  . Drug use: No  . Sexual activity: Not Currently  Other Topics Concern  . None  Social History Narrative   Patient is single and lives alone.   Patient  is retired.   Patient has a Scientist, water quality.   Patient has two adult children.   Patient is right-handed.   Patient drinks one cup of coffee daily.      Lives in [place] community   Retired Engineer, civil (consulting)     Family History:  The patient's family history includes Arthritis in his sister; CAD in his father and mother; Healthy in his sister; Pancreatic cancer in his father.   ROS:   Please see the history of present illness.    States he sleeps up to 13-15 hours daily.  Decreased memory.  Decreased appetite.  There stays cold all the time.  No shortness of breath. All other systems reviewed and are negative.   PHYSICAL EXAM:   VS:  BP 96/60   Pulse 65   Ht 5\' 8"  (1.727 m)   Wt 190 lb (86.2 kg)   BMI 28.89 kg/m    GEN: Well nourished, well developed, in no acute distress  HEENT: normal  Neck: no JVD, carotid bruits, or masses Cardiac: RRR; 3/6 systolic murmur but no rub, gallops, or edema. Respiratory:  clear to auscultation bilaterally, normal work of breathing GI: soft, nontender, nondistended, + BS MS: no deformity or atrophy  Skin: warm and dry, no rash Neuro:  Alert and Oriented x 3, Strength and sensation are intact Psych: euthymic mood, full affect  Wt Readings from Last 3 Encounters:  12/01/17 190 lb (86.2 kg)  10/14/17 178 lb 12.8 oz (81.1 kg)  09/14/17 178 lb (80.7 kg)      Studies/Labs Reviewed:   EKG:  EKG not performed  today.  Recent Labs: 08/04/2017: NT-Pro BNP 5,754 09/03/2017: ALT 17; B Natriuretic Peptide 567.6; Magnesium 2.5; TSH 2.319 09/21/2017: BUN 31; Creatinine, Ser 1.88; Hemoglobin 12.4; Platelets 129; Potassium 3.7; Sodium 139   Lipid Panel    Component Value Date/Time   CHOL 191 01/09/2016 0001  TRIG 251 (H) 01/09/2016 0001   HDL 56 01/09/2016 0001   CHOLHDL 3.4 01/09/2016 0001   VLDL 50 (H) 01/09/2016 0001   LDLCALC 85 01/09/2016 0001    Additional studies/ records that were reviewed today include:  Transesophageal  echocardiogram September 2018: Study Conclusions   - Left ventricle: The estimated ejection fraction was 15%. Diffuse   hypokinesis. - Aortic valve: Valve mobility was moderately restricted. No   evidence of vegetation. AVA by planimetry 1.5 cm2. Likely mild to   moderate AS. - Mitral valve: No evidence of vegetation. There was mild to   moderate regurgitation. - Left atrium: The appendage was moderately to severely dilated. - Right ventricle: The cavity size was dilated. Systolic function   was moderately to severely reduced. - Right atrium: The atrium was mildly dilated. - Atrial septum: No defect or patent foramen ovale was identified. - Tricuspid valve: No evidence of vegetation. - Pulmonic valve: No evidence of vegetation.    ASSESSMENT:    1. Chronic combined systolic and diastolic CHF (congestive heart failure) ([place])   2. Nonrheumatic aortic valve stenosis   3. Long term current use of amiodarone   4. Atypical atrial flutter ([place])   5. Coronary artery disease involving coronary bypass graft of native heart with angina pectoris ([place])   6. Essential hypertension, benign   7. OSA (obstructive sleep apnea)   8. SOB (shortness of breath)      PLAN:  In order of problems listed above:  1. Chronic systolic heart failure with most recent echo EF 15%.  Multifactorial etiology includes sleep apnea, uncontrolled atrial arrhythmias/fibrillation, moderately severe aortic stenosis, and underlying coronary disease with chronic ischemia.  There are no mechanical treatment options.  Plan to check kidney function electrolytes.  BNP needs to be done to ensure that lassitude is not related to low output failure. 2. Aortic stenosis is not critical based upon pretty intensive recent evaluation including a dobutamine stress test. 3. TSH and hepatic panel today. 4. Clinically stable with no noticeable clinical recurrences. 5. Rare angina not impacting quality of life.  Nitroglycerin should be  use of prolonged pain. 6. Relatively low blood pressure.  Blood work will help exclude bleeding and dehydration. 7. Resume CPAP. 8. BNP to compare with trend prior evaluation.  Clinical follow-up in 3 months.  Continue medications as listed.  Adjustments depending upon laboratory work.    Medication Adjustments/Labs and Tests Ordered: Current medicines are reviewed at length with the patient today.  Concerns regarding medicines are outlined above.  Medication changes, Labs and Tests ordered today are listed in the Patient Instructions below. Patient Instructions  Medication Instructions:  Your physician recommends that you continue on your current medications as directed. Please refer to the Current Medication list given to you today.  Labwork: TSH, CMET, and Pro BNP today  Testing/Procedures: None  Follow-Up: Your physician recommends that you schedule a follow-up appointment in: 3 months with Dr. Tamala Julian.    Any Other Special Instructions Will Be Listed Below (If Applicable).  Please resume using your CPAP.    If you need a refill on your cardiac medications before your next appointment, please call your pharmacy.      Signed, Sinclair Grooms, MD  12/01/2017 12:21 PM    [facility] [address], [place], [place]  [address] Phone: [phone]; Fax: [phone]

## 2017-12-01 ENCOUNTER — Ambulatory Visit (INDEPENDENT_AMBULATORY_CARE_PROVIDER_SITE_OTHER): Payer: Medicare Other | Admitting: Interventional Cardiology

## 2017-12-01 ENCOUNTER — Encounter: Payer: Self-pay | Admitting: Interventional Cardiology

## 2017-12-01 VITALS — BP 96/60 | HR 65 | Ht 68.0 in | Wt 190.0 lb

## 2017-12-01 DIAGNOSIS — R0602 Shortness of breath: Secondary | ICD-10-CM | POA: Diagnosis not present

## 2017-12-01 DIAGNOSIS — I2510 Atherosclerotic heart disease of native coronary artery without angina pectoris: Secondary | ICD-10-CM

## 2017-12-01 DIAGNOSIS — I10 Essential (primary) hypertension: Secondary | ICD-10-CM | POA: Diagnosis not present

## 2017-12-01 DIAGNOSIS — I25709 Atherosclerosis of coronary artery bypass graft(s), unspecified, with unspecified angina pectoris: Secondary | ICD-10-CM | POA: Diagnosis not present

## 2017-12-01 DIAGNOSIS — Z79899 Other long term (current) drug therapy: Secondary | ICD-10-CM

## 2017-12-01 DIAGNOSIS — I5042 Chronic combined systolic (congestive) and diastolic (congestive) heart failure: Secondary | ICD-10-CM

## 2017-12-01 DIAGNOSIS — I350 Nonrheumatic aortic (valve) stenosis: Secondary | ICD-10-CM

## 2017-12-01 DIAGNOSIS — G4733 Obstructive sleep apnea (adult) (pediatric): Secondary | ICD-10-CM

## 2017-12-01 DIAGNOSIS — I209 Angina pectoris, unspecified: Secondary | ICD-10-CM

## 2017-12-01 DIAGNOSIS — I484 Atypical atrial flutter: Secondary | ICD-10-CM

## 2017-12-01 NOTE — Patient Instructions (Signed)
Medication Instructions:  Your physician recommends that you continue on your current medications as directed. Please refer to the Current Medication list given to you today.  Labwork: TSH, CMET, and Pro BNP today  Testing/Procedures: None  Follow-Up: Your physician recommends that you schedule a follow-up appointment in: 3 months with Dr. Tamala Julian.    Any Other Special Instructions Will Be Listed Below (If Applicable).  Please resume using your CPAP.    If you need a refill on your cardiac medications before your next appointment, please call your pharmacy.

## 2017-12-02 DIAGNOSIS — M6281 Muscle weakness (generalized): Secondary | ICD-10-CM | POA: Diagnosis not present

## 2017-12-02 DIAGNOSIS — R278 Other lack of coordination: Secondary | ICD-10-CM | POA: Diagnosis not present

## 2017-12-02 DIAGNOSIS — Z9181 History of falling: Secondary | ICD-10-CM | POA: Diagnosis not present

## 2017-12-02 DIAGNOSIS — M545 Low back pain: Secondary | ICD-10-CM | POA: Diagnosis not present

## 2017-12-02 DIAGNOSIS — R2681 Unsteadiness on feet: Secondary | ICD-10-CM | POA: Diagnosis not present

## 2017-12-02 LAB — COMPREHENSIVE METABOLIC PANEL
A/G RATIO: 1.6 (ref 1.2–2.2)
ALT: 14 IU/L (ref 0–44)
AST: 23 IU/L (ref 0–40)
Albumin: 4.2 g/dL (ref 3.5–4.8)
Alkaline Phosphatase: 61 IU/L (ref 39–117)
BILIRUBIN TOTAL: 0.6 mg/dL (ref 0.0–1.2)
BUN/Creatinine Ratio: 23 (ref 10–24)
BUN: 36 mg/dL — ABNORMAL HIGH (ref 8–27)
CALCIUM: 9.7 mg/dL (ref 8.6–10.2)
CHLORIDE: 97 mmol/L (ref 96–106)
CO2: 28 mmol/L (ref 20–29)
Creatinine, Ser: 1.56 mg/dL — ABNORMAL HIGH (ref 0.76–1.27)
GFR, EST AFRICAN AMERICAN: 48 mL/min/{1.73_m2} — AB (ref >59)
GFR, EST NON AFRICAN AMERICAN: 42 mL/min/{1.73_m2} — AB (ref >59)
GLOBULIN, TOTAL: 2.7 g/dL (ref 1.5–4.5)
Glucose: 88 mg/dL (ref 65–99)
POTASSIUM: 4.5 mmol/L (ref 3.5–5.2)
SODIUM: 138 mmol/L (ref 134–144)
TOTAL PROTEIN: 6.9 g/dL (ref 6.0–8.5)

## 2017-12-02 LAB — PRO B NATRIURETIC PEPTIDE: NT-Pro BNP: 4634 pg/mL — ABNORMAL HIGH (ref 0–486)

## 2017-12-02 LAB — TSH: TSH: 1.38 u[IU]/mL (ref 0.450–4.500)

## 2017-12-03 ENCOUNTER — Telehealth: Payer: Self-pay | Admitting: Interventional Cardiology

## 2017-12-03 DIAGNOSIS — M6281 Muscle weakness (generalized): Secondary | ICD-10-CM | POA: Diagnosis not present

## 2017-12-03 DIAGNOSIS — R2681 Unsteadiness on feet: Secondary | ICD-10-CM | POA: Diagnosis not present

## 2017-12-03 DIAGNOSIS — Z9181 History of falling: Secondary | ICD-10-CM | POA: Diagnosis not present

## 2017-12-03 DIAGNOSIS — M545 Low back pain: Secondary | ICD-10-CM | POA: Diagnosis not present

## 2017-12-03 DIAGNOSIS — R278 Other lack of coordination: Secondary | ICD-10-CM | POA: Diagnosis not present

## 2017-12-03 NOTE — Telephone Encounter (Signed)
Informed daughter of lab results. Daughter verbalized understanding.

## 2017-12-03 NOTE — Telephone Encounter (Signed)
New message  Pt daughter verbalized that she is returning call for the rn   For pt lab results

## 2017-12-06 DIAGNOSIS — R2681 Unsteadiness on feet: Secondary | ICD-10-CM | POA: Diagnosis not present

## 2017-12-06 DIAGNOSIS — R278 Other lack of coordination: Secondary | ICD-10-CM | POA: Diagnosis not present

## 2017-12-06 DIAGNOSIS — Z9181 History of falling: Secondary | ICD-10-CM | POA: Diagnosis not present

## 2017-12-06 DIAGNOSIS — M6281 Muscle weakness (generalized): Secondary | ICD-10-CM | POA: Diagnosis not present

## 2017-12-06 DIAGNOSIS — M545 Low back pain: Secondary | ICD-10-CM | POA: Diagnosis not present

## 2017-12-09 ENCOUNTER — Telehealth: Payer: Self-pay | Admitting: Interventional Cardiology

## 2017-12-09 DIAGNOSIS — R278 Other lack of coordination: Secondary | ICD-10-CM | POA: Diagnosis not present

## 2017-12-09 DIAGNOSIS — Z9181 History of falling: Secondary | ICD-10-CM | POA: Diagnosis not present

## 2017-12-09 DIAGNOSIS — M6281 Muscle weakness (generalized): Secondary | ICD-10-CM | POA: Diagnosis not present

## 2017-12-09 DIAGNOSIS — R2681 Unsteadiness on feet: Secondary | ICD-10-CM | POA: Diagnosis not present

## 2017-12-09 DIAGNOSIS — M545 Low back pain: Secondary | ICD-10-CM | POA: Diagnosis not present

## 2017-12-09 NOTE — Telephone Encounter (Signed)
Aaron Crome, MD  Loren Racer, LPN        More furosemide if dyspnea but current low BP makes adding meds risky.

## 2017-12-09 NOTE — Telephone Encounter (Signed)
New message    Daughter calling for lab results. Please call

## 2017-12-09 NOTE — Telephone Encounter (Signed)
Patient's daughter calling back. Made her aware that Dr. Tamala Julian felt like since the patient's BP was on the lower side increasing the diuretic would worsen this. Daughter states that the patient has not been having any SOB or any other complaints. She states that her only concern was that the BNP from the hospital in September was 567 and this result was 4,634. I explained to her that our lab in the clinic checks a pro-BNP and the hospital checks a BNP and that although they evaluate the same thing the scales are different and the two results cannot be compared with one another. I let her know that the pro-BNP from August was 5,754 was down to 4,634 now, but given her father's lower BP, increasing the diuretic could be risky. Daughter verbalized understanding, agrees to keep the lasix the same, and thanked me for the call.

## 2017-12-10 DIAGNOSIS — Z9181 History of falling: Secondary | ICD-10-CM | POA: Diagnosis not present

## 2017-12-10 DIAGNOSIS — M6281 Muscle weakness (generalized): Secondary | ICD-10-CM | POA: Diagnosis not present

## 2017-12-10 DIAGNOSIS — M545 Low back pain: Secondary | ICD-10-CM | POA: Diagnosis not present

## 2017-12-10 DIAGNOSIS — R2681 Unsteadiness on feet: Secondary | ICD-10-CM | POA: Diagnosis not present

## 2017-12-10 DIAGNOSIS — R278 Other lack of coordination: Secondary | ICD-10-CM | POA: Diagnosis not present

## 2017-12-14 DIAGNOSIS — R2681 Unsteadiness on feet: Secondary | ICD-10-CM | POA: Diagnosis not present

## 2017-12-14 DIAGNOSIS — M6281 Muscle weakness (generalized): Secondary | ICD-10-CM | POA: Diagnosis not present

## 2017-12-14 DIAGNOSIS — Z9181 History of falling: Secondary | ICD-10-CM | POA: Diagnosis not present

## 2017-12-14 DIAGNOSIS — M545 Low back pain: Secondary | ICD-10-CM | POA: Diagnosis not present

## 2017-12-14 DIAGNOSIS — R278 Other lack of coordination: Secondary | ICD-10-CM | POA: Diagnosis not present

## 2017-12-16 DIAGNOSIS — Z96652 Presence of left artificial knee joint: Secondary | ICD-10-CM | POA: Diagnosis not present

## 2017-12-16 DIAGNOSIS — M25561 Pain in right knee: Secondary | ICD-10-CM | POA: Diagnosis not present

## 2017-12-16 DIAGNOSIS — Z96651 Presence of right artificial knee joint: Secondary | ICD-10-CM | POA: Diagnosis not present

## 2017-12-21 ENCOUNTER — Telehealth: Payer: Self-pay | Admitting: Adult Health

## 2017-12-21 ENCOUNTER — Encounter: Payer: Self-pay | Admitting: Family Medicine

## 2017-12-21 NOTE — Telephone Encounter (Signed)
LVM for daughter, Sharyn Lull on [place] advising her the patient was last seen in this office  Oct 2017, needs a follow up to get refills. Advised he did receive one month refill in Oct 2018 with note to schedule follow up. Advised to schedule FU or may ask PCP to refill. Left office number.

## 2017-12-21 NOTE — Telephone Encounter (Signed)
Pts daughter is requesting a refill for donepezil (ARICEPT) 23 MG TABS tablet sent to [id number] - [place], [address] [place]

## 2017-12-22 MED ORDER — DONEPEZIL HCL 23 MG PO TABS: 23.0000 mg | ORAL_TABLET | Freq: Every day | ORAL | 1 refills | Status: AC

## 2017-12-24 ENCOUNTER — Telehealth: Payer: Self-pay | Admitting: Family Medicine

## 2017-12-24 ENCOUNTER — Other Ambulatory Visit: Payer: Self-pay | Admitting: *Deleted

## 2017-12-24 DIAGNOSIS — N401 Enlarged prostate with lower urinary tract symptoms: Secondary | ICD-10-CM

## 2017-12-24 DIAGNOSIS — R351 Nocturia: Principal | ICD-10-CM

## 2017-12-24 MED ORDER — POTASSIUM CHLORIDE CRYS ER 20 MEQ PO TBCR: 20.0000 meq | EXTENDED_RELEASE_TABLET | Freq: Two times a day (BID) | ORAL | 3 refills | Status: AC

## 2017-12-24 MED ORDER — FINASTERIDE 5 MG PO TABS: 5.0000 mg | ORAL_TABLET | Freq: Every day | ORAL | 3 refills | Status: AC

## 2017-12-24 MED ORDER — FUROSEMIDE 80 MG PO TABS: ORAL_TABLET | ORAL | 3 refills | Status: AC

## 2017-12-24 NOTE — Telephone Encounter (Signed)
Rcvd request to sent a new 90 day script of Finasteride 5 mg to [facility] at [facility]

## 2018-01-11 ENCOUNTER — Telehealth: Payer: Self-pay | Admitting: Neurology

## 2018-01-11 ENCOUNTER — Ambulatory Visit
Admission: RE | Admit: 2018-01-11 | Discharge: 2018-01-11 | Disposition: A | Discharge status: Home / Self Care | Payer: Medicare Other | Source: Ambulatory Visit | Attending: Neurology | Admitting: Neurology

## 2018-01-11 DIAGNOSIS — R29818 Other symptoms and signs involving the nervous system: Secondary | ICD-10-CM

## 2018-01-11 NOTE — Telephone Encounter (Signed)
I have called the patients daughter and made her aware that the order is placed and I will notify our personnel so that we can get the authorization for the CT order. Patients daughter verbalized understanding.

## 2018-01-11 NOTE — Telephone Encounter (Signed)
Pts daughter(Jeanne on [place]) is calling stating that pt fell Friday night now is dizzy and leaning to the right. Pts daughter is wanting to know if they can go ahead and do a ct scan(daughter works at [employer]) to make sure its a concussion and nothing more serious. Please call to discuss

## 2018-01-11 NOTE — Telephone Encounter (Signed)
I like to get a CT non cotrast and will put the order in. Can she make sure the PCP is on board if nothing shows up- post fall care . CD

## 2018-02-01 ENCOUNTER — Emergency Department (HOSPITAL_COMMUNITY): Payer: Medicare Other

## 2018-02-01 ENCOUNTER — Emergency Department (HOSPITAL_COMMUNITY)
Admission: EM | Admit: 2018-02-01 | Discharge: 2018-02-01 | Disposition: A | Discharge status: Home / Self Care | Payer: Medicare Other | Attending: Emergency Medicine | Admitting: Emergency Medicine

## 2018-02-01 ENCOUNTER — Encounter (HOSPITAL_COMMUNITY): Payer: Self-pay | Admitting: Emergency Medicine

## 2018-02-01 ENCOUNTER — Other Ambulatory Visit: Payer: Self-pay

## 2018-02-01 DIAGNOSIS — J449 Chronic obstructive pulmonary disease, unspecified: Secondary | ICD-10-CM | POA: Insufficient documentation

## 2018-02-01 DIAGNOSIS — Z96653 Presence of artificial knee joint, bilateral: Secondary | ICD-10-CM | POA: Insufficient documentation

## 2018-02-01 DIAGNOSIS — Z955 Presence of coronary angioplasty implant and graft: Secondary | ICD-10-CM | POA: Insufficient documentation

## 2018-02-01 DIAGNOSIS — I110 Hypertensive heart disease with heart failure: Secondary | ICD-10-CM | POA: Diagnosis not present

## 2018-02-01 DIAGNOSIS — I2510 Atherosclerotic heart disease of native coronary artery without angina pectoris: Secondary | ICD-10-CM | POA: Insufficient documentation

## 2018-02-01 DIAGNOSIS — W19XXXA Unspecified fall, initial encounter: Secondary | ICD-10-CM | POA: Insufficient documentation

## 2018-02-01 DIAGNOSIS — Y92002 Bathroom of unspecified non-institutional (private) residence single-family (private) house as the place of occurrence of the external cause: Secondary | ICD-10-CM | POA: Insufficient documentation

## 2018-02-01 DIAGNOSIS — Y999 Unspecified external cause status: Secondary | ICD-10-CM | POA: Diagnosis not present

## 2018-02-01 DIAGNOSIS — I10 Essential (primary) hypertension: Secondary | ICD-10-CM | POA: Diagnosis not present

## 2018-02-01 DIAGNOSIS — R251 Tremor, unspecified: Secondary | ICD-10-CM

## 2018-02-01 DIAGNOSIS — R42 Dizziness and giddiness: Secondary | ICD-10-CM | POA: Diagnosis not present

## 2018-02-01 DIAGNOSIS — S0990XA Unspecified injury of head, initial encounter: Secondary | ICD-10-CM | POA: Diagnosis not present

## 2018-02-01 DIAGNOSIS — I6789 Other cerebrovascular disease: Secondary | ICD-10-CM | POA: Diagnosis not present

## 2018-02-01 DIAGNOSIS — Z951 Presence of aortocoronary bypass graft: Secondary | ICD-10-CM | POA: Insufficient documentation

## 2018-02-01 DIAGNOSIS — Y9389 Activity, other specified: Secondary | ICD-10-CM | POA: Diagnosis not present

## 2018-02-01 DIAGNOSIS — Z96611 Presence of right artificial shoulder joint: Secondary | ICD-10-CM | POA: Diagnosis not present

## 2018-02-01 DIAGNOSIS — I5042 Chronic combined systolic (congestive) and diastolic (congestive) heart failure: Secondary | ICD-10-CM | POA: Diagnosis not present

## 2018-02-01 DIAGNOSIS — Z85828 Personal history of other malignant neoplasm of skin: Secondary | ICD-10-CM | POA: Insufficient documentation

## 2018-02-01 DIAGNOSIS — I509 Heart failure, unspecified: Secondary | ICD-10-CM | POA: Diagnosis not present

## 2018-02-01 LAB — ETHANOL: Alcohol, Ethyl (B): <10 mg/dL (ref <10)

## 2018-02-01 LAB — PROTIME-INR
INR: 1.63
Prothrombin Time: 19.2 seconds — ABNORMAL HIGH (ref 11.4–15.2)

## 2018-02-01 LAB — URINALYSIS, ROUTINE W REFLEX MICROSCOPIC
Bacteria, UA: NONE SEEN
Bilirubin Urine: NEGATIVE
GLUCOSE, UA: NEGATIVE mg/dL
KETONES UR: NEGATIVE mg/dL
Leukocytes, UA: NEGATIVE
Nitrite: NEGATIVE
PH: 7 (ref 5.0–8.0)
Protein, ur: 30 mg/dL — AB
SPECIFIC GRAVITY, URINE: 1.009 (ref 1.005–1.030)

## 2018-02-01 LAB — RAPID URINE DRUG SCREEN, HOSP PERFORMED
Amphetamines: NOT DETECTED
BARBITURATES: NOT DETECTED
BENZODIAZEPINES: NOT DETECTED
Cocaine: NOT DETECTED
Opiates: NOT DETECTED
TETRAHYDROCANNABINOL: NOT DETECTED

## 2018-02-01 LAB — CBC
HEMATOCRIT: 39.5 % (ref 39.0–52.0)
HEMOGLOBIN: 12.7 g/dL — AB (ref 13.0–17.0)
MCH: 31.8 pg (ref 26.0–34.0)
MCHC: 32.2 g/dL (ref 30.0–36.0)
MCV: 98.8 fL (ref 78.0–100.0)
Platelets: 119 10*3/uL — ABNORMAL LOW (ref 150–400)
RBC: 4 MIL/uL — AB (ref 4.22–5.81)
RDW: 15.1 % (ref 11.5–15.5)
WBC: 7 10*3/uL (ref 4.0–10.5)

## 2018-02-01 LAB — COMPREHENSIVE METABOLIC PANEL
ALT: 30 U/L (ref 17–63)
AST: 44 U/L — AB (ref 15–41)
Albumin: 3.5 g/dL (ref 3.5–5.0)
Alkaline Phosphatase: 65 U/L (ref 38–126)
Anion gap: 15 (ref 5–15)
BILIRUBIN TOTAL: 1.3 mg/dL — AB (ref 0.3–1.2)
BUN: 32 mg/dL — AB (ref 6–20)
CHLORIDE: 103 mmol/L (ref 101–111)
CO2: 20 mmol/L — ABNORMAL LOW (ref 22–32)
CREATININE: 1.84 mg/dL — AB (ref 0.61–1.24)
Calcium: 9.2 mg/dL (ref 8.9–10.3)
GFR calc Af Amer: 38 mL/min — ABNORMAL LOW (ref >60)
GFR calc non Af Amer: 33 mL/min — ABNORMAL LOW (ref >60)
GLUCOSE: 81 mg/dL (ref 65–99)
Potassium: 4 mmol/L (ref 3.5–5.1)
Sodium: 138 mmol/L (ref 135–145)
TOTAL PROTEIN: 6.4 g/dL — AB (ref 6.5–8.1)

## 2018-02-01 LAB — APTT: APTT: 45 s — AB (ref 24–36)

## 2018-02-01 LAB — DIFFERENTIAL
BASOS ABS: 0 10*3/uL (ref 0.0–0.1)
Basophils Relative: 0 %
Eosinophils Absolute: 0 10*3/uL (ref 0.0–0.7)
Eosinophils Relative: 1 %
LYMPHS ABS: 0.5 10*3/uL — AB (ref 0.7–4.0)
Lymphocytes Relative: 8 %
Monocytes Absolute: 0.8 10*3/uL (ref 0.1–1.0)
Monocytes Relative: 11 %
NEUTROS ABS: 5.6 10*3/uL (ref 1.7–7.7)
Neutrophils Relative %: 80 %

## 2018-02-01 LAB — I-STAT TROPONIN, ED: TROPONIN I, POC: 0.02 ng/mL (ref 0.00–0.08)

## 2018-02-01 NOTE — ED Provider Notes (Signed)
[facility] EMERGENCY DEPARTMENT Provider Note   CSN: [id number] Arrival date & time: 02/01/18  1244     History   Chief Complaint Chief Complaint  Patient presents with  . Tremors    HAND  . Fall    HPI Aaron Berg is a 82 y.o. male.  HPI Patient presented to the emergency room for evaluation of tremors, weakness and falls.  Patient states that starting yesterday he began having shaking tremors in both his arms and his legs.  Patient states normally he does not have this trouble.  These episodes occur while he is awake.  He does not lose any consciousness.  Patient had an episode this morning when he was attempting to get off the toilet when he started to feel weak and shaky.  Patient also apparently has been having trouble with falls.  This is not a new issue for him.  He states he has balance issues ever since he had a prior stroke.  However he has fallen recently and staff at his independent living facility found him on the kitchen floor this morning.  Patient does not remember the fall.  He does take Eliquis. Past Medical History:  Diagnosis Date  . Aortic stenosis   . Arthritis    "thumbs, wrists, legs, spine" (05/12/2017)  . Atrial flutter ([place])   . Benign prostatic hyperplasia (BPH) with urinary urgency   . CAD (coronary artery disease)    a. s/p CABG 2003.  [name] Cerebrovascular disease 2006   multiple ischemic changes on prior MRI  . Chronic combined systolic and diastolic CHF (congestive heart failure) ([place])   . Chronic lower back pain   . Complication of anesthesia    "woke up too soon once; had 2nd stroke after right knee replacement"  . COPD (chronic obstructive pulmonary disease) ([place])    "no signs or tests"  . CVA (cerebral vascular accident) ([place]) late 1990s; 01/2014  . Dementia   . Depression   . Dry eyes   . Dyslipidemia    well controlled  . Gait disorder   . Heart murmur    "found in ~ 1946"  . Hypertension   . Hypertensive  cardiovascular disease   . Male hypogonadism   . Migraine    "major one when I was a kid; very painful"  . Mitral regurgitation   . Multiple falls    "after both knees surgeries; went to rehab; came out and fell all the time; I'm much better now" (05/12/2017)  . Obesity   . OSA on CPAP    cpap  . Paroxysmal atrial fibrillation (HCC)   . Skin cancer of anterior chest   . Spinal stenosis     Patient Active Problem List   Diagnosis Date Noted  . Tachycardia 09/04/2017  . Wide-complex tachycardia ([place]) 09/03/2017  . Aortic stenosis 07/30/2017  . Coronary artery disease involving coronary bypass graft of native heart with angina pectoris ([place]) 06/23/2017  . Benign prostatic hyperplasia with incomplete bladder emptying 05/21/2017  . Benign essential tremor 09/17/2016  . Rhinitis, allergic 09/11/2016  . Chronic low back pain 09/11/2016  . Status post total bilateral knee replacement 09/11/2016  . Multifactorial gait disorder 05/29/2016  . OSA (obstructive sleep apnea) 03/31/2016  . Late onset Alzheimer's disease without behavioral disturbance 03/31/2016  . Atrial flutter ([place]) 10/03/2014  . Multiple falls 08/09/2014  . Chronic anticoagulation 06/30/2014  . Paroxysmal atrial fibrillation ([place]) 03/03/2014  . Unspecified arthropathy, lower leg 01/29/2014  .  Chronic airway obstruction, not elsewhere classified 01/29/2014  . Obesity (BMI 30-39.9) 01/04/2014  . Chronic combined systolic and diastolic CHF (congestive heart failure) ([place]) 10/11/2013  . Essential hypertension, benign 10/11/2013  . Hyperlipidemia with target LDL less than 70 07/09/2011  . Hypogonadism male 07/09/2011    Past Surgical History:  Procedure Laterality Date  . APPENDECTOMY  as child  . CARDIAC CATHETERIZATION  11/2003   Archie Endo 04/29/2011  . CARDIAC CATHETERIZATION N/A 12/03/2016   Procedure: Left Heart Cath and Cors/Grafts Angiography;  Surgeon: Belva Crome, MD;  Location: [facility];  Service:  Cardiovascular;  Laterality: N/A;  . CARDIOVERSION N/A 04/26/2014   Procedure: CARDIOVERSION;  Surgeon: Sinclair Grooms, MD;  Location: [facility] ENDOSCOPY;  Service: Cardiovascular;  Laterality: N/A;  . CARDIOVERSION N/A 05/24/2014   Procedure: CARDIOVERSION;  Surgeon: Sinclair Grooms, MD;  Location: [facility] ENDOSCOPY;  Service: Cardiovascular;  Laterality: N/A;  . CARDIOVERSION N/A 09/04/2017   Procedure: CARDIOVERSION;  Surgeon: Jolaine Artist, MD;  Location: [facility];  Service: Cardiovascular;  Laterality: N/A;  . CATARACT EXTRACTION W/ INTRAOCULAR LENS  IMPLANT, BILATERAL Bilateral 2000s  . CORONARY ANGIOPLASTY  1991   "no stents" (10/06/2016)  . CORONARY ARTERY BYPASS GRAFT  11/2002   X 3/notes 04/29/2011  . CYST EXCISION     lumbar; Dr. Joya Salm  . EYE SURGERY Right    detached retina  . JOINT REPLACEMENT    . MOHS SURGERY     on chest  . RETINAL DETACHMENT SURGERY Right   . SHOULDER SURGERY Right    with murphy, reattach ligaments  . TEE WITHOUT CARDIOVERSION N/A 09/04/2017   Procedure: TRANSESOPHAGEAL ECHOCARDIOGRAM (TEE);  Surgeon: Jolaine Artist, MD;  Location: [facility] ENDOSCOPY;  Service: Cardiovascular;  Laterality: N/A;  . TONSILLECTOMY  as child  . TOTAL KNEE ARTHROPLASTY Right 01/02/2014   Procedure: RIGHT TOTAL KNEE ARTHROPLASTY;  Surgeon: Mauri Pole, MD;  Location: [facility];  Service: Orthopedics;  Laterality: Right;  . TOTAL KNEE ARTHROPLASTY Left 01/31/2014   Procedure: LEFT TOTAL KNEE ARTHROPLASTY;  Surgeon: Mauri Pole, MD;  Location: [facility];  Service: Orthopedics;  Laterality: Left;  . TOTAL SHOULDER ARTHROPLASTY Right   . VIDEO ASSISTED THORACOSCOPY (VATS)/THOROCOTOMY  2003   benign nodule       Home Medications    Prior to Admission medications   Medication Sig Start Date End Date Taking? Authorizing Provider  acetaminophen (TYLENOL) 500 MG tablet Take 500 mg by mouth 3 (three) times daily as needed for mild pain or moderate pain.    [provider]  amiodarone (PACERONE) 200 MG tablet Take 1 tablet (200 mg total) by mouth daily. 10/14/17   Belva Crome, MD  apixaban (ELIQUIS) 5 MG TABS tablet Take 5 mg by mouth 2 (two) times daily.    [provider]  Artificial Tear Ointment (DRY EYES OP) Place 2 drops into both eyes daily as needed.    [provider]  donepezil (ARICEPT) 23 MG TABS tablet Take 1 tablet (23 mg total) by mouth at bedtime. 12/22/17   [name], MD  DULoxetine (CYMBALTA) 30 MG capsule Take 1 capsule (30 mg total) by mouth daily. 10/09/17   [name], MD  feeding supplement, ENSURE ENLIVE, (ENSURE ENLIVE) LIQD Take 237 mLs by mouth 2 (two) times daily between meals. Patient taking differently: Take 237 mLs by mouth 2 (two) times daily between meals. boost 10/08/16   Reyne Dumas, MD  finasteride (  PROSCAR) 5 MG tablet Take 1 tablet (5 mg total) by mouth daily. 12/24/17   [name], MD  folic acid (FOLVITE) 1 MG tablet Take 1 tablet (1 mg total) by mouth daily. 10/07/17   [name], MD  furosemide (LASIX) 80 MG tablet Take 1.5 tablets (120mg ) by mouth in the morning.  Take 1 tablet (80mg ) by mouth in the evening. 12/24/17   Belva Crome, MD  Multiple Vitamins-Minerals (MULTIVITAMIN WITH MINERALS) tablet Take 1 tablet by mouth at bedtime. Reported on 01/30/2016    [provider]  nitroGLYCERIN (NITROSTAT) 0.4 MG SL tablet Place 1 tablet (0.4 mg total) under the tongue every 5 (five) minutes as needed for chest pain. 10/07/16   Bhagat, Crista Luria, PA  polyethylene glycol (MIRALAX / GLYCOLAX) packet Take 17 g by mouth daily.    [provider]  potassium chloride SA (KLOR-CON M20) 20 MEQ tablet Take 1 tablet (20 mEq total) by mouth 2 (two) times daily. 12/24/17   Belva Crome, MD  rosuvastatin (CRESTOR) 40 MG tablet Take 1 tablet (40 mg total) by mouth daily. Please keep 06/29/17 appt for future refills. 06/18/17   Burtis Junes, NP  sodium chloride (OCEAN)  0.65 % SOLN nasal spray Place 1 spray into both nostrils daily as needed for congestion.     [provider]    Family History Family History  Problem Relation Age of Onset  . Pancreatic cancer Father   . CAD Father   . CAD Mother   . Arthritis Sister   . Healthy Sister     Social History Social History   Tobacco Use  . Smoking status: Former Smoker    Packs/day: 2.00    Years: 33.00    Pack years: 66.00    Types: Cigarettes    Last attempt to quit: 06/17/1989    Years since quitting: 28.6  . Smokeless tobacco: Never Used  Substance Use Topics  . Alcohol use: Yes    Alcohol/week: 4.8 oz    Types: 8 Shots of liquor per week    Comment: 05/12/2017 "bourbon" (when he has it--ran out 05/2017)  . Drug use: No     Allergies   Procardia [nifedipine]   Review of Systems Review of Systems  All other systems reviewed and are negative.    Physical Exam Updated Vital Signs BP 110/83 (BP Location: Right Arm)   Pulse 80   Temp 98.1 F (36.7 C) (Oral)   Resp 15   SpO2 99%   Physical Exam  Constitutional: He is oriented to person, place, and time. No distress.  HENT:  Head: Normocephalic and atraumatic.  Right Ear: External ear normal.  Left Ear: External ear normal.  Mouth/Throat: Oropharynx is clear and moist.  Eyes: Conjunctivae are normal. Right eye exhibits no discharge. Left eye exhibits no discharge. No scleral icterus.  Neck: Neck supple. No tracheal deviation present.  Cardiovascular: Normal rate, regular rhythm and intact distal pulses.  Pulmonary/Chest: Effort normal and breath sounds normal. No stridor. No respiratory distress. He has no wheezes. He has no rales.  Abdominal: Soft. Bowel sounds are normal. He exhibits no distension. There is no tenderness. There is no rebound and no guarding.  Musculoskeletal: He exhibits no edema or tenderness.  Neurological: He is alert and oriented to person, place, and time. He has normal strength. He displays  tremor. No cranial nerve deficit (no facial droop, extraocular movements intact, no slurred speech) or sensory deficit. He exhibits normal  muscle tone. He displays no seizure activity. Coordination normal.  No pronator drift bilateral upper extrem, able to hold both legs off bed for 5 seconds, sensation intact in all extremities, no visual field cuts, no left or right sided neglect, normal finger-nose exam bilaterally, no nystagmus noted   Skin: Skin is warm and dry. No rash noted. He is not diaphoretic.  Psychiatric: He has a normal mood and affect.  Nursing note and vitals reviewed.    ED Treatments / Results  Labs (all labs ordered are listed, but only abnormal results are displayed) Labs Reviewed  PROTIME-INR - Abnormal; Notable for the following components:      Result Value   Prothrombin Time 19.2 (*)    All other components within normal limits  APTT - Abnormal; Notable for the following components:   aPTT 45 (*)    All other components within normal limits  CBC - Abnormal; Notable for the following components:   RBC 4.00 (*)    Hemoglobin 12.7 (*)    Platelets 119 (*)    All other components within normal limits  DIFFERENTIAL - Abnormal; Notable for the following components:   Lymphs Abs 0.5 (*)    All other components within normal limits  COMPREHENSIVE METABOLIC PANEL - Abnormal; Notable for the following components:   CO2 20 (*)    BUN 32 (*)    Creatinine, Ser 1.84 (*)    Total Protein 6.4 (*)    AST 44 (*)    Total Bilirubin 1.3 (*)    GFR calc non Af Amer 33 (*)    GFR calc Af Amer 38 (*)    All other components within normal limits  URINALYSIS, ROUTINE W REFLEX MICROSCOPIC - Abnormal; Notable for the following components:   Hgb urine dipstick SMALL (*)    Protein, ur 30 (*)    Squamous Epithelial / LPF 0-5 (*)    All other components within normal limits  ETHANOL  RAPID URINE DRUG SCREEN, HOSP PERFORMED  I-STAT TROPONIN, ED    EKG  EKG  Interpretation  Date/Time:  Monday February 01 2018 13:13:28 EST Ventricular Rate:  79 PR Interval:    QRS Duration: 123 QT Interval:  466 QTC Calculation: 535 R Axis:   -30 Text Interpretation:  Sinus rhythm with first degree AV block Ventricular premature complex , new since last tracing Left bundle branch block Confirmed by Dorie Rank [phone]) on 02/01/2018 1:16:54 PM       Radiology Dg Chest 1 View  Result Date: 02/01/2018 CLINICAL DATA:  Multiple falls. EXAM: CHEST 1 VIEW COMPARISON:  09/21/2017 FINDINGS: Bilateral diffuse interstitial thickening. Trace right pleural effusion. No left pleural effusion. No pneumothorax. Stable cardiomegaly. Prior CABG. Right shoulder arthroplasty without failure or complication. No acute osseous abnormality. IMPRESSION: Mild CHF. Electronically Signed   By: Kathreen Devoid   On: 02/01/2018 14:26   Ct Head Wo Contrast  Result Date: 02/01/2018 CLINICAL DATA:  Tremors beginning yesterday with dizziness and imbalance. EXAM: CT HEAD WITHOUT CONTRAST TECHNIQUE: Contiguous axial images were obtained from the base of the skull through the vertex without intravenous contrast. COMPARISON:  01/11/2018 FINDINGS: BRAIN: There is chronic sulcal and ventricular prominence consistent with superficial and central atrophy. No intraparenchymal hemorrhage, mass effect nor midline shift. Periventricular and subcortical white matter hypodensities consistent with chronic moderate small vessel ischemic disease are identified. No acute large vascular territory infarcts. No abnormal extra-axial fluid collections. Basal cisterns are not effaced and midline. VASCULAR: Moderate calcific atherosclerosis  of the carotid siphons. SKULL: No skull fracture. No significant scalp soft tissue swelling. SINUSES/ORBITS: The mastoid air-cells are clear. The included paranasal sinuses are well-aerated.The included ocular globes and orbital contents are non-suspicious. OTHER: None. IMPRESSION: Atrophy  with chronic small vessel ischemia. No acute intracranial abnormality. Electronically Signed   By: Ashley Royalty M.D.   On: 02/01/2018 14:05    Procedures Procedures (including critical care time)  Medications Ordered in ED Medications - No data to display   Initial Impression / Assessment and Plan / ED Course  I have reviewed the triage vital signs and the nursing notes.  Pertinent labs & imaging results that were available during my care of the patient were reviewed by me and considered in my medical decision making (see chart for details).  Clinical Course as of Feb 01 1554  Mon Feb 01, 2018  1455 Labs reviewed.  No UTI.  CBC and CMET are unremarkable.  Chronic renal insuff unchanged   [JK]  1456 CT scan without acute findings.  Doubt acute CHF  [JK]  1554 Pt was able to ambulate with his walker.  Mild tremor noted but he feels this is usual for him and not severe.  Daughter present as well and feels that he is at his baseline.  [JK]    Clinical Course User Index [JK] Dorie Rank, MD    Patient presented to the emergency room for evaluation of weakness and recent falls.  Patient has a history of prior strokes.  He has some unsteadiness at baseline and uses a walker.  Patient is on Eliquis so a CT scan of his head was performed.  No acute findings noted.  Patient seems to be back at his baseline now.  Does not have any focal weakness.  He is able to ambulate with the assistance of a walker.  He does have a persistent tremor but is not showing any seizure-like activity.  I discussed all his laboratory findings with the patient as well as his daughter.  They feel comfortable with him going home now.  I recommended follow-up with his primary doctor and possibly his neurologist considering his tremor.  Final Clinical Impressions(s) / ED Diagnoses   Final diagnoses:  Tremor  Injury of head, initial encounter    ED Discharge Orders    None       Dorie Rank, MD 02/01/18 1557

## 2018-02-01 NOTE — ED Notes (Signed)
Patient transported to CT 

## 2018-02-01 NOTE — ED Notes (Signed)
Pt ambulated with walker and stand by assist-- O2 sats on room air after ambulating 88%-- discussed with daughter-- states that he has had low oxygen in past and dr did test for home 02 --

## 2018-02-01 NOTE — ED Notes (Signed)
Patient transported to X-ray 

## 2018-02-01 NOTE — ED Notes (Signed)
Patient desat to 84% on room air, Nasal canula applied. O2 is up to 94%

## 2018-02-01 NOTE — ED Triage Notes (Signed)
Patient arrived via [facility] from an "independent living  [facility]". Patient is A&O X4. Per EMS, patient reported having tremors since yesterday. EMS witnessed hand tremors upon arrival to his facility. Staff also reported that patient has had many falls, he was found on the kitchen floor this morning. Patient states that he does not remember the fall this morning. Patient is on Eliquis.

## 2018-02-01 NOTE — Discharge Instructions (Signed)
continue your current medications, follow-up with your primary doctor and consider seeing your neurologist.  Return as needed for worsening symptoms

## 2018-02-02 ENCOUNTER — Other Ambulatory Visit: Payer: Self-pay | Admitting: Family Medicine

## 2018-02-02 NOTE — Telephone Encounter (Signed)
Daughter left message pt needs refill Tramadol.  I called daughter and she states that pt has had some falls and has hurt his back and would like refill on his Tramadol.  Says has been taking tylenol but not helping much and sometimes he needs the Tramadol.  I called pharmacy and last fill for Tramadol was 10/18.

## 2018-02-04 ENCOUNTER — Telehealth: Payer: Self-pay | Admitting: Family Medicine

## 2018-02-04 NOTE — Telephone Encounter (Signed)
Marlie at [facility] called requesting an order for Occupational Therapy to eval & treat pt for positioning for wheelchair at the request of the family due to the number of falls the pt's had recently. Fax order to [phone]

## 2018-02-04 NOTE — Telephone Encounter (Signed)
ok 

## 2018-02-07 ENCOUNTER — Encounter: Payer: Self-pay | Admitting: Interventional Cardiology

## 2018-02-09 NOTE — Telephone Encounter (Signed)
Ok

## 2018-02-09 NOTE — Telephone Encounter (Signed)
Need order for wheel chair for pt . Could you please write one up thanks [facility]

## 2018-02-10 ENCOUNTER — Telehealth: Payer: Self-pay | Admitting: *Deleted

## 2018-02-10 DIAGNOSIS — R451 Restlessness and agitation: Secondary | ICD-10-CM | POA: Diagnosis not present

## 2018-02-10 DIAGNOSIS — I4891 Unspecified atrial fibrillation: Secondary | ICD-10-CM | POA: Diagnosis not present

## 2018-02-10 DIAGNOSIS — M6281 Muscle weakness (generalized): Secondary | ICD-10-CM | POA: Diagnosis not present

## 2018-02-10 DIAGNOSIS — F0390 Unspecified dementia without behavioral disturbance: Secondary | ICD-10-CM | POA: Diagnosis not present

## 2018-02-10 DIAGNOSIS — F339 Major depressive disorder, recurrent, unspecified: Secondary | ICD-10-CM | POA: Diagnosis not present

## 2018-02-10 DIAGNOSIS — M545 Low back pain: Secondary | ICD-10-CM | POA: Diagnosis not present

## 2018-02-10 DIAGNOSIS — I5042 Chronic combined systolic (congestive) and diastolic (congestive) heart failure: Secondary | ICD-10-CM

## 2018-02-10 DIAGNOSIS — E785 Hyperlipidemia, unspecified: Secondary | ICD-10-CM | POA: Diagnosis not present

## 2018-02-10 DIAGNOSIS — N400 Enlarged prostate without lower urinary tract symptoms: Secondary | ICD-10-CM | POA: Diagnosis not present

## 2018-02-10 DIAGNOSIS — I10 Essential (primary) hypertension: Secondary | ICD-10-CM | POA: Diagnosis not present

## 2018-02-10 DIAGNOSIS — E569 Vitamin deficiency, unspecified: Secondary | ICD-10-CM | POA: Diagnosis not present

## 2018-02-10 DIAGNOSIS — R52 Pain, unspecified: Secondary | ICD-10-CM | POA: Diagnosis not present

## 2018-02-10 DIAGNOSIS — I509 Heart failure, unspecified: Secondary | ICD-10-CM | POA: Diagnosis not present

## 2018-02-10 NOTE — Telephone Encounter (Signed)
Please get a CBC , cmet and BNP to evaluate CHF  ----- Message -----  From: Richmond Campbell, LPN  Sent: 5/73/2202  7:51 AM  To: Belva Crome, MD  Subject: Melton Alar: Non-Urgent Medical Question               ----- Message -----  From: [name]  Sent: 02/07/2018  3:09 PM  To: [address] Triage  Subject: Non-Urgent Medical Question             ----- Message from Grand, Generic sent at 02/07/2018 3:09 PM EST -----    Hi, this is [name], Bob's daughter. I was wondering if I could bring dad in to get his BNP checked one day this week. I know we have an appointment March 22, but I'm curious to know something a bit sooner. He's falling every day. We have sitters for him now, but [facility] is going to force him from independent living to their nursing home any time now. He will have to give up his cat and perhaps his bourbon, the only two things he says he's living for. He's sleeping 12-14 hours a day, often taking naps in the afternoon as well. He's not taking good care of himself, like shaving and showering. He looks more frail and disheveled. He keeps saying he doesn't think he's going to be around much longer. Obtaining a current BNP would help me figure out how best to help dad navigate his future. Thank you!    Order placed for labs and message sent back to daughter to see which day pt can come for labs.

## 2018-02-11 DIAGNOSIS — R278 Other lack of coordination: Secondary | ICD-10-CM | POA: Diagnosis not present

## 2018-02-11 DIAGNOSIS — M6281 Muscle weakness (generalized): Secondary | ICD-10-CM | POA: Diagnosis not present

## 2018-02-11 DIAGNOSIS — I509 Heart failure, unspecified: Secondary | ICD-10-CM | POA: Diagnosis not present

## 2018-02-11 DIAGNOSIS — R296 Repeated falls: Secondary | ICD-10-CM | POA: Diagnosis not present

## 2018-02-11 DIAGNOSIS — Z9181 History of falling: Secondary | ICD-10-CM | POA: Diagnosis not present

## 2018-02-12 DIAGNOSIS — I4891 Unspecified atrial fibrillation: Secondary | ICD-10-CM | POA: Diagnosis not present

## 2018-02-12 DIAGNOSIS — N400 Enlarged prostate without lower urinary tract symptoms: Secondary | ICD-10-CM | POA: Diagnosis not present

## 2018-02-12 DIAGNOSIS — E569 Vitamin deficiency, unspecified: Secondary | ICD-10-CM | POA: Diagnosis not present

## 2018-02-12 DIAGNOSIS — R451 Restlessness and agitation: Secondary | ICD-10-CM | POA: Diagnosis not present

## 2018-02-12 DIAGNOSIS — M6281 Muscle weakness (generalized): Secondary | ICD-10-CM | POA: Diagnosis not present

## 2018-02-12 DIAGNOSIS — I10 Essential (primary) hypertension: Secondary | ICD-10-CM | POA: Diagnosis not present

## 2018-02-12 DIAGNOSIS — M545 Low back pain: Secondary | ICD-10-CM | POA: Diagnosis not present

## 2018-02-12 DIAGNOSIS — F0390 Unspecified dementia without behavioral disturbance: Secondary | ICD-10-CM | POA: Diagnosis not present

## 2018-02-12 DIAGNOSIS — I509 Heart failure, unspecified: Secondary | ICD-10-CM | POA: Diagnosis not present

## 2018-02-12 DIAGNOSIS — R296 Repeated falls: Secondary | ICD-10-CM | POA: Diagnosis not present

## 2018-02-12 DIAGNOSIS — F339 Major depressive disorder, recurrent, unspecified: Secondary | ICD-10-CM | POA: Diagnosis not present

## 2018-02-12 DIAGNOSIS — E785 Hyperlipidemia, unspecified: Secondary | ICD-10-CM | POA: Diagnosis not present

## 2018-02-12 DIAGNOSIS — R52 Pain, unspecified: Secondary | ICD-10-CM | POA: Diagnosis not present

## 2018-02-12 DIAGNOSIS — Z9181 History of falling: Secondary | ICD-10-CM | POA: Diagnosis not present

## 2018-02-12 DIAGNOSIS — R278 Other lack of coordination: Secondary | ICD-10-CM | POA: Diagnosis not present

## 2018-02-14 NOTE — Progress Notes (Signed)
Cardiology Office Note    Date:  02/15/2018   ID:  Aaron Berg, DOB 07/31/1936, MRN [id number]  PCP:  [name], MD  Cardiologist: Sinclair Grooms, MD   Chief Complaint  Patient presents with  . Congestive Heart Failure  . Coronary Artery Disease    History of Present Illness:  Aaron Berg is a 82 y.o. male a history of CAD s/p CABG in 2003, prior cath in 2012 with patent LIMA to LAD, patent SVG to RCA and 50% ostial stenosis of SVG to LCx and EF being 35% at that time. Progressive chronic combined systolic and diastolic heart failure with falling EF over the past 10 months, mild aortic stenosis/mitral regurg by echo in 09/2016, HTN, HLD, pAfib on eliquis, OSA on CPAP, COPD, microvascular dementia, CVA, and falls.  TEE cardioversion September 04, 2017 for atrial flutter with rapid rate aggravating heart failure.   He is accompanied by his daughter.  He is now in nursing care at his facility.  He had frequent falls and several emergency room visits in the interim.  Most recent ER visit was 9 days ago after a fall.  His appetite is relatively poor.  Mostly surviving from fluid intake.  Denies orthopnea.  No episodes of chest pain.  He is a DO NOT INTUBATE and do not perform CPR according to he and his daughter.  This was worked out with Dr. Linda Hedges who is a physician at his facility-[facility].   Past Surgical History:  Procedure Laterality Date  . APPENDECTOMY  as child  . CARDIAC CATHETERIZATION  11/2003   Archie Endo 04/29/2011  . CARDIAC CATHETERIZATION N/A 12/03/2016   Procedure: Left Heart Cath and Cors/Grafts Angiography;  Surgeon: Belva Crome, MD;  Location: [facility];  Service: Cardiovascular;  Laterality: N/A;  . CARDIOVERSION N/A 04/26/2014   Procedure: CARDIOVERSION;  Surgeon: Sinclair Grooms, MD;  Location: [facility] ENDOSCOPY;  Service: Cardiovascular;  Laterality: N/A;  . CARDIOVERSION N/A 05/24/2014   Procedure: CARDIOVERSION;  Surgeon: Sinclair Grooms, MD;  Location: [facility] ENDOSCOPY;  Service: Cardiovascular;  Laterality: N/A;  . CARDIOVERSION N/A 09/04/2017   Procedure: CARDIOVERSION;  Surgeon: Jolaine Artist, MD;  Location: [facility];  Service: Cardiovascular;  Laterality: N/A;  . CATARACT EXTRACTION W/ INTRAOCULAR LENS  IMPLANT, BILATERAL Bilateral 2000s  . CORONARY ANGIOPLASTY  1991   "no stents" (10/06/2016)  . CORONARY ARTERY BYPASS GRAFT  11/2002   X 3/notes 04/29/2011  . CYST EXCISION     lumbar; Dr. Joya Salm  . EYE SURGERY Right    detached retina  . JOINT REPLACEMENT    . MOHS SURGERY     on chest  . RETINAL DETACHMENT SURGERY Right   . SHOULDER SURGERY Right    with murphy, reattach ligaments  . TEE WITHOUT CARDIOVERSION N/A 09/04/2017   Procedure: TRANSESOPHAGEAL ECHOCARDIOGRAM (TEE);  Surgeon: Jolaine Artist, MD;  Location: [facility]COPY;  Service: Cardiovascular;  Laterality: N/A;  . TONSILLECTOMY  as child  . TOTAL KNEE ARTHROPLASTY Right 01/02/2014   Procedure: RIGHT TOTAL KNEE ARTHROPLASTY;  Surgeon: Mauri Pole, MD;  Location: [facility];  Service: Orthopedics;  Laterality: Right;  . TOTAL KNEE ARTHROPLASTY Left 01/31/2014   Procedure: LEFT TOTAL KNEE ARTHROPLASTY;  Surgeon: Mauri Pole, MD;  Location: [facility];  Service: Orthopedics;  Laterality: Left;  . TOTAL SHOULDER ARTHROPLASTY Right   . VIDEO ASSISTED THORACOSCOPY (VATS)/THOROCOTOMY  2003   benign nodule  Current Medications: Outpatient Medications Prior to Visit  Medication Sig Dispense Refill  . acetaminophen (TYLENOL) 500 MG tablet Take 500 mg by mouth 2 (two) times daily as needed for mild pain or moderate pain.     [name] amiodarone (PACERONE) 200 MG tablet Take 1 tablet (200 mg total) by mouth daily. 90 tablet 3  . apixaban (ELIQUIS) 5 MG TABS tablet Take 5 mg by mouth 2 (two) times daily.    [name] donepezil (ARICEPT) 23 MG TABS tablet Take 1 tablet (23 mg total) by mouth at bedtime. 90 tablet 1  . DULoxetine (CYMBALTA) 30 MG capsule Take 1  capsule (30 mg total) by mouth daily. 90 capsule 1  . finasteride (PROSCAR) 5 MG tablet Take 1 tablet (5 mg total) by mouth daily. 90 tablet 3  . folic acid (FOLVITE) 427 MCG tablet Take 400 mcg by mouth daily.    . furosemide (LASIX) 80 MG tablet Take 1.5 tablets (120mg ) by mouth in the morning.  Take 1 tablet (80mg ) by mouth in the evening. 225 tablet 3  . LORazepam (ATIVAN) 0.5 MG tablet Take 0.5 mg by mouth 2 (two) times daily as needed (restlessness/agitation).    . Multiple Vitamins-Minerals (MULTIVITAMIN WITH MINERALS) tablet Take 1 tablet by mouth at bedtime. Reported on 01/30/2016    . nitroGLYCERIN (NITROSTAT) 0.4 MG SL tablet Place 1 tablet (0.4 mg total) under the tongue every 5 (five) minutes as needed for chest pain. 25 tablet 12  . potassium chloride SA (KLOR-CON M20) 20 MEQ tablet Take 1 tablet (20 mEq total) by mouth 2 (two) times daily. 180 tablet 3  . rosuvastatin (CRESTOR) 40 MG tablet Take 1 tablet (40 mg total) by mouth daily. Please keep 06/29/17 appt for future refills. 90 tablet 3  . sodium chloride (OCEAN) 0.65 % SOLN nasal spray Place 1 spray into both nostrils daily as needed for congestion.     . traMADol (ULTRAM) 50 MG tablet Take 50 mg by mouth 2 (two) times daily as needed for moderate pain.    . Artificial Tear Ointment (DRY EYES OP) Place 2 drops into both eyes daily as needed.    . feeding supplement, ENSURE ENLIVE, (ENSURE ENLIVE) LIQD Take 237 mLs by mouth 2 (two) times daily between meals. (Patient not taking: Reported on 02/15/2018) 062 mL 12  . folic acid (FOLVITE) 1 MG tablet Take 1 tablet (1 mg total) by mouth daily. (Patient not taking: Reported on 02/15/2018) 90 tablet 3  . polyethylene glycol (MIRALAX / GLYCOLAX) packet Take 17 g by mouth daily.    . traMADol (ULTRAM) 50 MG tablet TAKE 2 TABLETS BY MOUTH EVERY 8 HOURS AS NEEDED (Patient not taking: Reported on 02/15/2018) 50 tablet 0   No facility-administered medications prior to visit.      Allergies:    Procardia [nifedipine]   Social History   Socioeconomic History  . Marital status: Divorced    Spouse name: None  . Number of children: 2  . Years of education: Masters  . Highest education level: None  Social Needs  . Financial resource strain: None  . Food insecurity - worry: None  . Food insecurity - inability: None  . Transportation needs - medical: None  . Transportation needs - non-medical: None  Occupational History    Comment: retired  Tobacco Use  . Smoking status: Former Smoker    Packs/day: 2.00    Years: 33.00    Pack years: 66.00    Types: Cigarettes  Last attempt to quit: 06/17/1989    Years since quitting: 28.6  . Smokeless tobacco: Never Used  Substance and Sexual Activity  . Alcohol use: Yes    Alcohol/week: 4.8 oz    Types: 8 Shots of liquor per week    Comment: 05/12/2017 "bourbon" (when he has it--ran out 05/2017)  . Drug use: No  . Sexual activity: Not Currently  Other Topics Concern  . None  Social History Narrative   Patient is single and lives alone.   Patient is retired.   Patient has a Scientist, water quality.   Patient has two adult children.   Patient is right-handed.   Patient drinks one cup of coffee daily.      Lives in [place] community   Retired Engineer, civil (consulting)     Family History:  The patient's family history includes Arthritis in his sister; CAD in his father and mother; Healthy in his sister; Pancreatic cancer in his father.   ROS:   Please see the history of present illness.    No All other systems reviewed and are negative.   PHYSICAL EXAM:   VS:  BP 90/60   Pulse 73   Ht 5\' 9"  (1.753 m)   Wt 187 lb (84.8 kg)   BMI 27.62 kg/m    GEN: Well nourished, well developed, in no acute distress  HEENT: normal  Neck: no JVD, carotid bruits, or masses Cardiac: 2-3/6 right upper sternal border systolic murmur of AS, RRR; rubs, or gallops,no edema  Respiratory:  clear to auscultation bilaterally, normal work of breathing GI:  soft, nontender, nondistended, + BS MS: no deformity or atrophy  Skin: warm and dry, no rash Neuro:  Alert and Oriented x 3, Strength and sensation are intact Psych: euthymic mood, full affect  Wt Readings from Last 3 Encounters:  02/15/18 187 lb (84.8 kg)  12/01/17 190 lb (86.2 kg)  10/14/17 178 lb 12.8 oz (81.1 kg)      Studies/Labs Reviewed:   EKG:  EKG sinus rhythm, left anterior hemiblock, old inferior infarct, PVCs, poor R wave progression.  No change compared to prior.  Recent Labs: 09/03/2017: B Natriuretic Peptide 567.6; Magnesium 2.5 12/01/2017: NT-Pro BNP 4,634; TSH 1.380 02/01/2018: ALT 30; BUN 32; Creatinine, Ser 1.84; Hemoglobin 12.7; Platelets 119; Potassium 4.0; Sodium 138   Lipid Panel    Component Value Date/Time   CHOL 191 01/09/2016 0001   TRIG 251 (H) 01/09/2016 0001   HDL 56 01/09/2016 0001   CHOLHDL 3.4 01/09/2016 0001   VLDL 50 (H) 01/09/2016 0001   LDLCALC 85 01/09/2016 0001    Additional studies/ records that were reviewed today include:  Chest x-ray performed 02/01/18 demonstrated mild CHF.    ASSESSMENT:    1. Chronic combined systolic and diastolic CHF (congestive heart failure) ([place])   2. Nonrheumatic aortic valve stenosis   3. Typical atrial flutter ([place])   4. Coronary artery disease involving coronary bypass graft of native heart with angina pectoris ([place])   5. Essential hypertension, benign   6. Paroxysmal atrial fibrillation ([facility])      PLAN:  In order of problems listed above:  1. No clinical evidence of volume overload on exam.  We will do a basic metabolic panel and BNP today.  He appears to be mildly volume contracted.  Perhaps decreasing intensity of diuretic therapy will support blood pressure a bit more and allow him to have more strength. 2. Systolic murmur is audible.  Given current clinical situation would not do  further evaluation. 3. Rhythm control on low-dose amiodarone 4. No anginal complaints. 5. Relatively low blood  pressure.  Using today's blood work if there is evidence of volume contraction, decrease furosemide intensity.  Agree with limited resuscitation.  He has managed by Dr. Adella Hare.  I will plan to see him back in 6 months.  If I feel he can get by with less diuretic therapy I will let Dr. Linda Hedges know.  Medication Adjustments/Labs and Tests Ordered: Current medicines are reviewed at length with the patient today.  Concerns regarding medicines are outlined above.  Medication changes, Labs and Tests ordered today are listed in the Patient Instructions below. There are no Patient Instructions on file for this visit.   Signed, Sinclair Grooms, MD  02/15/2018 3:34 PM    [facility] [address], [place], [place]  [address] Phone: [phone]; Fax: [phone]

## 2018-02-15 ENCOUNTER — Ambulatory Visit (INDEPENDENT_AMBULATORY_CARE_PROVIDER_SITE_OTHER): Payer: Medicare Other | Admitting: Interventional Cardiology

## 2018-02-15 ENCOUNTER — Encounter: Payer: Self-pay | Admitting: Interventional Cardiology

## 2018-02-15 VITALS — BP 90/60 | HR 73 | Ht 69.0 in | Wt 187.0 lb

## 2018-02-15 DIAGNOSIS — E569 Vitamin deficiency, unspecified: Secondary | ICD-10-CM | POA: Diagnosis not present

## 2018-02-15 DIAGNOSIS — M6281 Muscle weakness (generalized): Secondary | ICD-10-CM | POA: Diagnosis not present

## 2018-02-15 DIAGNOSIS — I5042 Chronic combined systolic (congestive) and diastolic (congestive) heart failure: Secondary | ICD-10-CM | POA: Diagnosis not present

## 2018-02-15 DIAGNOSIS — I480 Paroxysmal atrial fibrillation: Secondary | ICD-10-CM | POA: Diagnosis not present

## 2018-02-15 DIAGNOSIS — N400 Enlarged prostate without lower urinary tract symptoms: Secondary | ICD-10-CM | POA: Diagnosis not present

## 2018-02-15 DIAGNOSIS — R52 Pain, unspecified: Secondary | ICD-10-CM | POA: Diagnosis not present

## 2018-02-15 DIAGNOSIS — F0390 Unspecified dementia without behavioral disturbance: Secondary | ICD-10-CM | POA: Diagnosis not present

## 2018-02-15 DIAGNOSIS — I483 Typical atrial flutter: Secondary | ICD-10-CM | POA: Diagnosis not present

## 2018-02-15 DIAGNOSIS — F339 Major depressive disorder, recurrent, unspecified: Secondary | ICD-10-CM | POA: Diagnosis not present

## 2018-02-15 DIAGNOSIS — I25709 Atherosclerosis of coronary artery bypass graft(s), unspecified, with unspecified angina pectoris: Secondary | ICD-10-CM

## 2018-02-15 DIAGNOSIS — Z9181 History of falling: Secondary | ICD-10-CM | POA: Diagnosis not present

## 2018-02-15 DIAGNOSIS — I350 Nonrheumatic aortic (valve) stenosis: Secondary | ICD-10-CM

## 2018-02-15 DIAGNOSIS — R278 Other lack of coordination: Secondary | ICD-10-CM | POA: Diagnosis not present

## 2018-02-15 DIAGNOSIS — I4891 Unspecified atrial fibrillation: Secondary | ICD-10-CM | POA: Diagnosis not present

## 2018-02-15 DIAGNOSIS — R296 Repeated falls: Secondary | ICD-10-CM | POA: Diagnosis not present

## 2018-02-15 DIAGNOSIS — E785 Hyperlipidemia, unspecified: Secondary | ICD-10-CM | POA: Diagnosis not present

## 2018-02-15 DIAGNOSIS — R451 Restlessness and agitation: Secondary | ICD-10-CM | POA: Diagnosis not present

## 2018-02-15 DIAGNOSIS — I509 Heart failure, unspecified: Secondary | ICD-10-CM | POA: Diagnosis not present

## 2018-02-15 DIAGNOSIS — I10 Essential (primary) hypertension: Secondary | ICD-10-CM | POA: Diagnosis not present

## 2018-02-15 DIAGNOSIS — M545 Low back pain: Secondary | ICD-10-CM | POA: Diagnosis not present

## 2018-02-15 NOTE — Patient Instructions (Signed)
Medication Instructions:  Your physician recommends that you continue on your current medications as directed. Please refer to the Current Medication list given to you today.  Labwork: BMET and Pro BNP today  Testing/Procedures: None  Follow-Up: Your physician wants you to follow-up in: 6 months with Dr. Smith.  You will receive a reminder letter in the mail two months in advance. If you don't receive a letter, please call our office to schedule the follow-up appointment.   Any Other Special Instructions Will Be Listed Below (If Applicable).     If you need a refill on your cardiac medications before your next appointment, please call your pharmacy.   

## 2018-02-16 DIAGNOSIS — R278 Other lack of coordination: Secondary | ICD-10-CM | POA: Diagnosis not present

## 2018-02-16 DIAGNOSIS — Z9181 History of falling: Secondary | ICD-10-CM | POA: Diagnosis not present

## 2018-02-16 DIAGNOSIS — M6281 Muscle weakness (generalized): Secondary | ICD-10-CM | POA: Diagnosis not present

## 2018-02-16 DIAGNOSIS — R296 Repeated falls: Secondary | ICD-10-CM | POA: Diagnosis not present

## 2018-02-16 DIAGNOSIS — I509 Heart failure, unspecified: Secondary | ICD-10-CM | POA: Diagnosis not present

## 2018-02-16 LAB — BASIC METABOLIC PANEL
BUN / CREAT RATIO: 17 (ref 10–24)
BUN: 36 mg/dL — AB (ref 8–27)
CHLORIDE: 97 mmol/L (ref 96–106)
CO2: 27 mmol/L (ref 20–29)
CREATININE: 2.1 mg/dL — AB (ref 0.76–1.27)
Calcium: 9.2 mg/dL (ref 8.6–10.2)
GFR calc Af Amer: 34 mL/min/{1.73_m2} — ABNORMAL LOW (ref >59)
GFR calc non Af Amer: 29 mL/min/{1.73_m2} — ABNORMAL LOW (ref >59)
Glucose: 98 mg/dL (ref 65–99)
Potassium: 3.6 mmol/L (ref 3.5–5.2)
Sodium: 138 mmol/L (ref 134–144)

## 2018-02-16 LAB — PRO B NATRIURETIC PEPTIDE: NT-Pro BNP: 8472 pg/mL — ABNORMAL HIGH (ref 0–486)

## 2018-02-17 ENCOUNTER — Other Ambulatory Visit: Payer: Medicare Other

## 2018-02-17 DIAGNOSIS — R451 Restlessness and agitation: Secondary | ICD-10-CM | POA: Diagnosis not present

## 2018-02-17 DIAGNOSIS — K5900 Constipation, unspecified: Secondary | ICD-10-CM | POA: Diagnosis not present

## 2018-02-17 DIAGNOSIS — M545 Low back pain: Secondary | ICD-10-CM | POA: Diagnosis not present

## 2018-02-17 DIAGNOSIS — F339 Major depressive disorder, recurrent, unspecified: Secondary | ICD-10-CM | POA: Diagnosis not present

## 2018-02-17 DIAGNOSIS — F419 Anxiety disorder, unspecified: Secondary | ICD-10-CM | POA: Diagnosis not present

## 2018-02-17 DIAGNOSIS — G301 Alzheimer's disease with late onset: Secondary | ICD-10-CM | POA: Diagnosis not present

## 2018-02-17 DIAGNOSIS — E785 Hyperlipidemia, unspecified: Secondary | ICD-10-CM | POA: Diagnosis not present

## 2018-02-17 DIAGNOSIS — E569 Vitamin deficiency, unspecified: Secondary | ICD-10-CM | POA: Diagnosis not present

## 2018-02-17 DIAGNOSIS — R296 Repeated falls: Secondary | ICD-10-CM | POA: Diagnosis not present

## 2018-02-17 DIAGNOSIS — R278 Other lack of coordination: Secondary | ICD-10-CM | POA: Diagnosis not present

## 2018-02-17 DIAGNOSIS — M6281 Muscle weakness (generalized): Secondary | ICD-10-CM | POA: Diagnosis not present

## 2018-02-17 DIAGNOSIS — I10 Essential (primary) hypertension: Secondary | ICD-10-CM | POA: Diagnosis not present

## 2018-02-17 DIAGNOSIS — I4891 Unspecified atrial fibrillation: Secondary | ICD-10-CM | POA: Diagnosis not present

## 2018-02-17 DIAGNOSIS — Z9181 History of falling: Secondary | ICD-10-CM | POA: Diagnosis not present

## 2018-02-17 DIAGNOSIS — I509 Heart failure, unspecified: Secondary | ICD-10-CM | POA: Diagnosis not present

## 2018-02-17 DIAGNOSIS — F0280 Dementia in other diseases classified elsewhere without behavioral disturbance: Secondary | ICD-10-CM | POA: Diagnosis not present

## 2018-02-17 DIAGNOSIS — F331 Major depressive disorder, recurrent, moderate: Secondary | ICD-10-CM | POA: Diagnosis not present

## 2018-02-17 DIAGNOSIS — N400 Enlarged prostate without lower urinary tract symptoms: Secondary | ICD-10-CM | POA: Diagnosis not present

## 2018-02-17 DIAGNOSIS — R52 Pain, unspecified: Secondary | ICD-10-CM | POA: Diagnosis not present

## 2018-02-18 DIAGNOSIS — Z9181 History of falling: Secondary | ICD-10-CM | POA: Diagnosis not present

## 2018-02-18 DIAGNOSIS — M6281 Muscle weakness (generalized): Secondary | ICD-10-CM | POA: Diagnosis not present

## 2018-02-18 DIAGNOSIS — R278 Other lack of coordination: Secondary | ICD-10-CM | POA: Diagnosis not present

## 2018-02-18 DIAGNOSIS — I509 Heart failure, unspecified: Secondary | ICD-10-CM | POA: Diagnosis not present

## 2018-02-18 DIAGNOSIS — R296 Repeated falls: Secondary | ICD-10-CM | POA: Diagnosis not present

## 2018-02-19 DIAGNOSIS — I509 Heart failure, unspecified: Secondary | ICD-10-CM | POA: Diagnosis not present

## 2018-02-19 DIAGNOSIS — Z9181 History of falling: Secondary | ICD-10-CM | POA: Diagnosis not present

## 2018-02-19 DIAGNOSIS — R296 Repeated falls: Secondary | ICD-10-CM | POA: Diagnosis not present

## 2018-02-19 DIAGNOSIS — R278 Other lack of coordination: Secondary | ICD-10-CM | POA: Diagnosis not present

## 2018-02-19 DIAGNOSIS — M6281 Muscle weakness (generalized): Secondary | ICD-10-CM | POA: Diagnosis not present

## 2018-02-22 DIAGNOSIS — I509 Heart failure, unspecified: Secondary | ICD-10-CM | POA: Diagnosis not present

## 2018-02-22 DIAGNOSIS — M6281 Muscle weakness (generalized): Secondary | ICD-10-CM | POA: Diagnosis not present

## 2018-02-22 DIAGNOSIS — R296 Repeated falls: Secondary | ICD-10-CM | POA: Diagnosis not present

## 2018-02-22 DIAGNOSIS — Z9181 History of falling: Secondary | ICD-10-CM | POA: Diagnosis not present

## 2018-02-22 DIAGNOSIS — R278 Other lack of coordination: Secondary | ICD-10-CM | POA: Diagnosis not present

## 2018-02-23 DIAGNOSIS — I509 Heart failure, unspecified: Secondary | ICD-10-CM | POA: Diagnosis not present

## 2018-02-23 DIAGNOSIS — M6281 Muscle weakness (generalized): Secondary | ICD-10-CM | POA: Diagnosis not present

## 2018-02-23 DIAGNOSIS — R296 Repeated falls: Secondary | ICD-10-CM | POA: Diagnosis not present

## 2018-02-23 DIAGNOSIS — R278 Other lack of coordination: Secondary | ICD-10-CM | POA: Diagnosis not present

## 2018-02-23 DIAGNOSIS — Z9181 History of falling: Secondary | ICD-10-CM | POA: Diagnosis not present

## 2018-02-23 NOTE — Telephone Encounter (Signed)
I have faxed this over to [facility] at [facility]

## 2018-02-24 DIAGNOSIS — Z9181 History of falling: Secondary | ICD-10-CM | POA: Diagnosis not present

## 2018-02-24 DIAGNOSIS — R451 Restlessness and agitation: Secondary | ICD-10-CM | POA: Diagnosis not present

## 2018-02-24 DIAGNOSIS — M545 Low back pain: Secondary | ICD-10-CM | POA: Diagnosis not present

## 2018-02-24 DIAGNOSIS — M6281 Muscle weakness (generalized): Secondary | ICD-10-CM | POA: Diagnosis not present

## 2018-02-24 DIAGNOSIS — F0390 Unspecified dementia without behavioral disturbance: Secondary | ICD-10-CM | POA: Diagnosis not present

## 2018-02-24 DIAGNOSIS — R278 Other lack of coordination: Secondary | ICD-10-CM | POA: Diagnosis not present

## 2018-02-24 DIAGNOSIS — K5900 Constipation, unspecified: Secondary | ICD-10-CM | POA: Diagnosis not present

## 2018-02-24 DIAGNOSIS — I10 Essential (primary) hypertension: Secondary | ICD-10-CM | POA: Diagnosis not present

## 2018-02-24 DIAGNOSIS — E569 Vitamin deficiency, unspecified: Secondary | ICD-10-CM | POA: Diagnosis not present

## 2018-02-24 DIAGNOSIS — N400 Enlarged prostate without lower urinary tract symptoms: Secondary | ICD-10-CM | POA: Diagnosis not present

## 2018-02-24 DIAGNOSIS — I4891 Unspecified atrial fibrillation: Secondary | ICD-10-CM | POA: Diagnosis not present

## 2018-02-24 DIAGNOSIS — E785 Hyperlipidemia, unspecified: Secondary | ICD-10-CM | POA: Diagnosis not present

## 2018-02-24 DIAGNOSIS — R52 Pain, unspecified: Secondary | ICD-10-CM | POA: Diagnosis not present

## 2018-02-24 DIAGNOSIS — R296 Repeated falls: Secondary | ICD-10-CM | POA: Diagnosis not present

## 2018-02-24 DIAGNOSIS — I509 Heart failure, unspecified: Secondary | ICD-10-CM | POA: Diagnosis not present

## 2018-02-25 DIAGNOSIS — F339 Major depressive disorder, recurrent, unspecified: Secondary | ICD-10-CM | POA: Diagnosis not present

## 2018-02-25 DIAGNOSIS — M6281 Muscle weakness (generalized): Secondary | ICD-10-CM | POA: Diagnosis not present

## 2018-02-25 DIAGNOSIS — R296 Repeated falls: Secondary | ICD-10-CM | POA: Diagnosis not present

## 2018-02-25 DIAGNOSIS — Z9181 History of falling: Secondary | ICD-10-CM | POA: Diagnosis not present

## 2018-02-25 DIAGNOSIS — M545 Low back pain: Secondary | ICD-10-CM | POA: Diagnosis not present

## 2018-02-25 DIAGNOSIS — R278 Other lack of coordination: Secondary | ICD-10-CM | POA: Diagnosis not present

## 2018-02-25 DIAGNOSIS — I509 Heart failure, unspecified: Secondary | ICD-10-CM | POA: Diagnosis not present

## 2018-02-25 DIAGNOSIS — F0390 Unspecified dementia without behavioral disturbance: Secondary | ICD-10-CM | POA: Diagnosis not present

## 2018-02-26 DIAGNOSIS — R278 Other lack of coordination: Secondary | ICD-10-CM | POA: Diagnosis not present

## 2018-02-26 DIAGNOSIS — M6281 Muscle weakness (generalized): Secondary | ICD-10-CM | POA: Diagnosis not present

## 2018-02-26 DIAGNOSIS — R296 Repeated falls: Secondary | ICD-10-CM | POA: Diagnosis not present

## 2018-02-26 DIAGNOSIS — Z9181 History of falling: Secondary | ICD-10-CM | POA: Diagnosis not present

## 2018-02-26 DIAGNOSIS — I509 Heart failure, unspecified: Secondary | ICD-10-CM | POA: Diagnosis not present

## 2018-03-01 DIAGNOSIS — R278 Other lack of coordination: Secondary | ICD-10-CM | POA: Diagnosis not present

## 2018-03-01 DIAGNOSIS — M6281 Muscle weakness (generalized): Secondary | ICD-10-CM | POA: Diagnosis not present

## 2018-03-01 DIAGNOSIS — R296 Repeated falls: Secondary | ICD-10-CM | POA: Diagnosis not present

## 2018-03-01 DIAGNOSIS — I509 Heart failure, unspecified: Secondary | ICD-10-CM | POA: Diagnosis not present

## 2018-03-01 DIAGNOSIS — Z9181 History of falling: Secondary | ICD-10-CM | POA: Diagnosis not present

## 2018-03-02 DIAGNOSIS — R296 Repeated falls: Secondary | ICD-10-CM | POA: Diagnosis not present

## 2018-03-02 DIAGNOSIS — R451 Restlessness and agitation: Secondary | ICD-10-CM | POA: Diagnosis not present

## 2018-03-02 DIAGNOSIS — I509 Heart failure, unspecified: Secondary | ICD-10-CM | POA: Diagnosis not present

## 2018-03-02 DIAGNOSIS — F0390 Unspecified dementia without behavioral disturbance: Secondary | ICD-10-CM | POA: Diagnosis not present

## 2018-03-02 DIAGNOSIS — I4891 Unspecified atrial fibrillation: Secondary | ICD-10-CM | POA: Diagnosis not present

## 2018-03-02 DIAGNOSIS — M6281 Muscle weakness (generalized): Secondary | ICD-10-CM | POA: Diagnosis not present

## 2018-03-02 DIAGNOSIS — R278 Other lack of coordination: Secondary | ICD-10-CM | POA: Diagnosis not present

## 2018-03-02 DIAGNOSIS — Z9181 History of falling: Secondary | ICD-10-CM | POA: Diagnosis not present

## 2018-03-03 ENCOUNTER — Ambulatory Visit: Payer: Medicare Other | Admitting: Interventional Cardiology

## 2018-03-03 DIAGNOSIS — F419 Anxiety disorder, unspecified: Secondary | ICD-10-CM | POA: Diagnosis not present

## 2018-03-04 DIAGNOSIS — R609 Edema, unspecified: Secondary | ICD-10-CM | POA: Diagnosis not present

## 2018-03-05 ENCOUNTER — Ambulatory Visit: Payer: Medicare Other | Admitting: Interventional Cardiology

## 2018-03-08 DIAGNOSIS — I509 Heart failure, unspecified: Secondary | ICD-10-CM | POA: Diagnosis not present

## 2018-03-08 DIAGNOSIS — M6281 Muscle weakness (generalized): Secondary | ICD-10-CM | POA: Diagnosis not present

## 2018-03-08 DIAGNOSIS — Z9181 History of falling: Secondary | ICD-10-CM | POA: Diagnosis not present

## 2018-03-08 DIAGNOSIS — R278 Other lack of coordination: Secondary | ICD-10-CM | POA: Diagnosis not present

## 2018-03-08 DIAGNOSIS — R296 Repeated falls: Secondary | ICD-10-CM | POA: Diagnosis not present

## 2018-03-08 DIAGNOSIS — W19XXXA Unspecified fall, initial encounter: Secondary | ICD-10-CM | POA: Diagnosis not present

## 2018-03-10 DIAGNOSIS — Z9181 History of falling: Secondary | ICD-10-CM | POA: Diagnosis not present

## 2018-03-10 DIAGNOSIS — R296 Repeated falls: Secondary | ICD-10-CM | POA: Diagnosis not present

## 2018-03-10 DIAGNOSIS — I509 Heart failure, unspecified: Secondary | ICD-10-CM | POA: Diagnosis not present

## 2018-03-10 DIAGNOSIS — M6281 Muscle weakness (generalized): Secondary | ICD-10-CM | POA: Diagnosis not present

## 2018-03-10 DIAGNOSIS — R21 Rash and other nonspecific skin eruption: Secondary | ICD-10-CM | POA: Diagnosis not present

## 2018-03-10 DIAGNOSIS — R278 Other lack of coordination: Secondary | ICD-10-CM | POA: Diagnosis not present

## 2018-03-12 DIAGNOSIS — I509 Heart failure, unspecified: Secondary | ICD-10-CM | POA: Diagnosis not present

## 2018-03-12 DIAGNOSIS — R296 Repeated falls: Secondary | ICD-10-CM | POA: Diagnosis not present

## 2018-03-12 DIAGNOSIS — M6281 Muscle weakness (generalized): Secondary | ICD-10-CM | POA: Diagnosis not present

## 2018-03-12 DIAGNOSIS — Z9181 History of falling: Secondary | ICD-10-CM | POA: Diagnosis not present

## 2018-03-12 DIAGNOSIS — R278 Other lack of coordination: Secondary | ICD-10-CM | POA: Diagnosis not present

## 2018-03-15 DIAGNOSIS — M6281 Muscle weakness (generalized): Secondary | ICD-10-CM | POA: Diagnosis not present

## 2018-03-15 DIAGNOSIS — R296 Repeated falls: Secondary | ICD-10-CM | POA: Diagnosis not present

## 2018-03-16 DIAGNOSIS — M6281 Muscle weakness (generalized): Secondary | ICD-10-CM | POA: Diagnosis not present

## 2018-03-16 DIAGNOSIS — R278 Other lack of coordination: Secondary | ICD-10-CM | POA: Diagnosis not present

## 2018-03-16 DIAGNOSIS — Z9181 History of falling: Secondary | ICD-10-CM | POA: Diagnosis not present

## 2018-03-16 DIAGNOSIS — R609 Edema, unspecified: Secondary | ICD-10-CM | POA: Diagnosis not present

## 2018-03-16 DIAGNOSIS — R296 Repeated falls: Secondary | ICD-10-CM | POA: Diagnosis not present

## 2018-03-16 DIAGNOSIS — I509 Heart failure, unspecified: Secondary | ICD-10-CM | POA: Diagnosis not present

## 2018-03-17 DIAGNOSIS — Z9181 History of falling: Secondary | ICD-10-CM | POA: Diagnosis not present

## 2018-03-17 DIAGNOSIS — G301 Alzheimer's disease with late onset: Secondary | ICD-10-CM | POA: Diagnosis not present

## 2018-03-17 DIAGNOSIS — R278 Other lack of coordination: Secondary | ICD-10-CM | POA: Diagnosis not present

## 2018-03-17 DIAGNOSIS — F419 Anxiety disorder, unspecified: Secondary | ICD-10-CM | POA: Diagnosis not present

## 2018-03-17 DIAGNOSIS — F331 Major depressive disorder, recurrent, moderate: Secondary | ICD-10-CM | POA: Diagnosis not present

## 2018-03-17 DIAGNOSIS — F0280 Dementia in other diseases classified elsewhere without behavioral disturbance: Secondary | ICD-10-CM | POA: Diagnosis not present

## 2018-03-17 DIAGNOSIS — R296 Repeated falls: Secondary | ICD-10-CM | POA: Diagnosis not present

## 2018-03-17 DIAGNOSIS — I509 Heart failure, unspecified: Secondary | ICD-10-CM | POA: Diagnosis not present

## 2018-03-17 DIAGNOSIS — M6281 Muscle weakness (generalized): Secondary | ICD-10-CM | POA: Diagnosis not present

## 2018-03-18 DIAGNOSIS — I359 Nonrheumatic aortic valve disorder, unspecified: Secondary | ICD-10-CM | POA: Diagnosis not present

## 2018-03-18 DIAGNOSIS — I519 Heart disease, unspecified: Secondary | ICD-10-CM | POA: Diagnosis not present

## 2018-03-18 DIAGNOSIS — I509 Heart failure, unspecified: Secondary | ICD-10-CM | POA: Diagnosis not present

## 2018-03-18 DIAGNOSIS — R0902 Hypoxemia: Secondary | ICD-10-CM | POA: Diagnosis not present

## 2018-03-18 DIAGNOSIS — F0151 Vascular dementia with behavioral disturbance: Secondary | ICD-10-CM | POA: Diagnosis not present

## 2018-03-18 DIAGNOSIS — N401 Enlarged prostate with lower urinary tract symptoms: Secondary | ICD-10-CM | POA: Diagnosis not present

## 2018-03-18 DIAGNOSIS — E785 Hyperlipidemia, unspecified: Secondary | ICD-10-CM | POA: Diagnosis not present

## 2018-03-18 DIAGNOSIS — I25810 Atherosclerosis of coronary artery bypass graft(s) without angina pectoris: Secondary | ICD-10-CM | POA: Diagnosis not present

## 2018-03-18 DIAGNOSIS — I4891 Unspecified atrial fibrillation: Secondary | ICD-10-CM | POA: Diagnosis not present

## 2018-03-18 DIAGNOSIS — J309 Allergic rhinitis, unspecified: Secondary | ICD-10-CM | POA: Diagnosis not present

## 2018-03-18 DIAGNOSIS — K219 Gastro-esophageal reflux disease without esophagitis: Secondary | ICD-10-CM | POA: Diagnosis not present

## 2018-03-18 DIAGNOSIS — J449 Chronic obstructive pulmonary disease, unspecified: Secondary | ICD-10-CM | POA: Diagnosis not present

## 2018-03-18 DIAGNOSIS — F339 Major depressive disorder, recurrent, unspecified: Secondary | ICD-10-CM | POA: Diagnosis not present

## 2018-03-18 DIAGNOSIS — I10 Essential (primary) hypertension: Secondary | ICD-10-CM | POA: Diagnosis not present

## 2018-03-18 DIAGNOSIS — N184 Chronic kidney disease, stage 4 (severe): Secondary | ICD-10-CM | POA: Diagnosis not present

## 2018-03-18 DIAGNOSIS — I679 Cerebrovascular disease, unspecified: Secondary | ICD-10-CM | POA: Diagnosis not present

## 2018-03-21 ENCOUNTER — Encounter: Payer: Self-pay | Admitting: Interventional Cardiology

## 2018-04-14 DEATH — deceased
